# Patient Record
Sex: Female | Born: 1965
Health system: Southern US, Community
[De-identification: ages and names within clinical notes are randomized; demographics above are authoritative.]

## PROBLEM LIST (undated history)

## (undated) DIAGNOSIS — E785 Hyperlipidemia, unspecified: Secondary | ICD-10-CM

## (undated) DIAGNOSIS — D649 Anemia, unspecified: Secondary | ICD-10-CM

## (undated) DIAGNOSIS — I214 Non-ST elevation (NSTEMI) myocardial infarction: Secondary | ICD-10-CM

## (undated) DIAGNOSIS — E079 Disorder of thyroid, unspecified: Secondary | ICD-10-CM

## (undated) DIAGNOSIS — I10 Essential (primary) hypertension: Secondary | ICD-10-CM

## (undated) HISTORY — DX: Non-ST elevation (NSTEMI) myocardial infarction: I21.4

## (undated) HISTORY — DX: Anemia, unspecified: D64.9

## (undated) HISTORY — DX: Hyperlipidemia, unspecified: E78.5

## (undated) SURGERY — Surgical Case
Anesthesia: *Unknown

---

## 1998-04-17 ENCOUNTER — Other Ambulatory Visit: Admission: RE | Admit: 1998-04-17 | Discharge: 1998-04-17 | Payer: Self-pay | Admitting: Family Medicine

## 2001-02-24 ENCOUNTER — Ambulatory Visit (HOSPITAL_COMMUNITY): Admission: RE | Admit: 2001-02-24 | Discharge: 2001-02-24 | Payer: Self-pay | Admitting: Family Medicine

## 2002-03-05 ENCOUNTER — Emergency Department (HOSPITAL_COMMUNITY): Admission: EM | Admit: 2002-03-05 | Discharge: 2002-03-05 | Payer: Self-pay | Admitting: Emergency Medicine

## 2002-11-01 ENCOUNTER — Encounter: Payer: Self-pay | Admitting: Emergency Medicine

## 2002-11-01 ENCOUNTER — Emergency Department (HOSPITAL_COMMUNITY): Admission: EM | Admit: 2002-11-01 | Discharge: 2002-11-02 | Payer: Self-pay | Admitting: Emergency Medicine

## 2004-05-23 ENCOUNTER — Ambulatory Visit: Payer: Self-pay | Admitting: Family Medicine

## 2004-05-27 ENCOUNTER — Ambulatory Visit: Payer: Self-pay | Admitting: *Deleted

## 2004-08-11 ENCOUNTER — Ambulatory Visit: Payer: Self-pay | Admitting: Family Medicine

## 2004-09-22 ENCOUNTER — Emergency Department (HOSPITAL_COMMUNITY): Admission: EM | Admit: 2004-09-22 | Discharge: 2004-09-22 | Payer: Self-pay | Admitting: Emergency Medicine

## 2005-02-11 ENCOUNTER — Ambulatory Visit: Payer: Self-pay | Admitting: Family Medicine

## 2005-03-18 ENCOUNTER — Ambulatory Visit: Payer: Self-pay | Admitting: Family Medicine

## 2005-03-27 ENCOUNTER — Ambulatory Visit (HOSPITAL_COMMUNITY): Admission: RE | Admit: 2005-03-27 | Discharge: 2005-03-27 | Payer: Self-pay | Admitting: Family Medicine

## 2005-10-06 ENCOUNTER — Emergency Department (HOSPITAL_COMMUNITY): Admission: EM | Admit: 2005-10-06 | Discharge: 2005-10-06 | Payer: Self-pay | Admitting: Emergency Medicine

## 2005-11-26 ENCOUNTER — Ambulatory Visit: Payer: Self-pay | Admitting: Family Medicine

## 2007-01-20 ENCOUNTER — Ambulatory Visit: Payer: Self-pay | Admitting: Family Medicine

## 2007-02-22 ENCOUNTER — Ambulatory Visit: Payer: Self-pay | Admitting: Family Medicine

## 2007-06-08 ENCOUNTER — Encounter (INDEPENDENT_AMBULATORY_CARE_PROVIDER_SITE_OTHER): Payer: Self-pay | Admitting: *Deleted

## 2008-01-30 ENCOUNTER — Ambulatory Visit: Payer: Self-pay | Admitting: Family Medicine

## 2008-01-30 LAB — CONVERTED CEMR LAB
AST: 18 units/L (ref 0–37)
Albumin: 4.6 g/dL (ref 3.5–5.2)
CO2: 20 meq/L (ref 19–32)
Chloride: 102 meq/L (ref 96–112)
Cholesterol: 208 mg/dL — ABNORMAL HIGH (ref 0–200)
Eosinophils Absolute: 0.2 10*3/uL (ref 0.0–0.7)
Glucose, Bld: 355 mg/dL — ABNORMAL HIGH (ref 70–99)
HCT: 36 % (ref 36.0–46.0)
HDL: 48 mg/dL (ref >39)
Hemoglobin: 10.1 g/dL — ABNORMAL LOW (ref 12.0–15.0)
LDL Cholesterol: 129 mg/dL — ABNORMAL HIGH (ref 0–99)
Lymphocytes Relative: 22 % (ref 12–46)
MCV: 63.5 fL — ABNORMAL LOW (ref 78.0–100.0)
Potassium: 4.6 meq/L (ref 3.5–5.3)
RDW: 16.9 % — ABNORMAL HIGH (ref 11.5–15.5)
Total CHOL/HDL Ratio: 4.3
Total Protein: 8.4 g/dL — ABNORMAL HIGH (ref 6.0–8.3)

## 2008-02-01 ENCOUNTER — Encounter (INDEPENDENT_AMBULATORY_CARE_PROVIDER_SITE_OTHER): Payer: Self-pay | Admitting: Family Medicine

## 2008-02-01 LAB — CONVERTED CEMR LAB
Retic Ct Pct: 1.5 % (ref 0.4–3.1)
TIBC: 462 ug/dL (ref 250–470)

## 2008-02-20 ENCOUNTER — Encounter (INDEPENDENT_AMBULATORY_CARE_PROVIDER_SITE_OTHER): Payer: Self-pay | Admitting: Family Medicine

## 2008-02-20 ENCOUNTER — Ambulatory Visit: Payer: Self-pay | Admitting: Internal Medicine

## 2008-02-23 ENCOUNTER — Ambulatory Visit (HOSPITAL_COMMUNITY): Admission: RE | Admit: 2008-02-23 | Discharge: 2008-02-23 | Payer: Self-pay | Admitting: Family Medicine

## 2008-05-02 ENCOUNTER — Ambulatory Visit: Payer: Self-pay | Admitting: Family Medicine

## 2008-06-28 ENCOUNTER — Emergency Department (HOSPITAL_COMMUNITY): Admission: EM | Admit: 2008-06-28 | Discharge: 2008-06-29 | Payer: Self-pay | Admitting: Emergency Medicine

## 2008-07-10 ENCOUNTER — Ambulatory Visit: Payer: Self-pay | Admitting: Family Medicine

## 2008-08-14 ENCOUNTER — Ambulatory Visit: Payer: Self-pay | Admitting: Internal Medicine

## 2009-02-21 ENCOUNTER — Emergency Department (HOSPITAL_COMMUNITY): Admission: EM | Admit: 2009-02-21 | Discharge: 2009-02-22 | Payer: Self-pay | Admitting: Emergency Medicine

## 2009-06-04 ENCOUNTER — Ambulatory Visit: Payer: Self-pay | Admitting: Family Medicine

## 2009-07-02 ENCOUNTER — Ambulatory Visit: Payer: Self-pay | Admitting: Family Medicine

## 2009-07-02 LAB — CONVERTED CEMR LAB
Basophils Absolute: 0.1 10*3/uL (ref 0.0–0.1)
Chloride: 104 meq/L (ref 96–112)
Eosinophils Absolute: 0.1 10*3/uL (ref 0.0–0.7)
Glucose, Bld: 311 mg/dL — ABNORMAL HIGH (ref 70–99)
HCT: 38.5 % (ref 36.0–46.0)
Lymphs Abs: 2.2 10*3/uL (ref 0.7–4.0)
MCV: 80 fL (ref 78.0–100.0)
Neutro Abs: 3.2 10*3/uL (ref 1.7–7.7)
Platelets: 310 10*3/uL (ref 150–400)
RBC: 4.81 M/uL (ref 3.87–5.11)
Vit D, 25-Hydroxy: 23 ng/mL — ABNORMAL LOW (ref 30–89)

## 2009-08-28 ENCOUNTER — Ambulatory Visit: Payer: Self-pay | Admitting: Family Medicine

## 2009-09-03 ENCOUNTER — Ambulatory Visit: Payer: Self-pay | Admitting: Family Medicine

## 2009-11-05 ENCOUNTER — Ambulatory Visit: Payer: Self-pay | Admitting: Family Medicine

## 2009-12-03 ENCOUNTER — Ambulatory Visit: Payer: Self-pay | Admitting: Family Medicine

## 2010-01-06 ENCOUNTER — Ambulatory Visit: Payer: Self-pay | Admitting: Family Medicine

## 2010-01-06 LAB — CONVERTED CEMR LAB
Creatinine, Ser: 0.68 mg/dL (ref 0.40–1.20)
HDL: 68 mg/dL (ref >39)
Hgb A1c MFr Bld: 14.2 % — ABNORMAL HIGH (ref <5.7)
LDL Cholesterol: 108 mg/dL — ABNORMAL HIGH (ref 0–99)
Potassium: 3.7 meq/L (ref 3.5–5.3)
VLDL: 18 mg/dL (ref 0–40)

## 2010-02-04 ENCOUNTER — Ambulatory Visit: Payer: Self-pay | Admitting: Family Medicine

## 2010-02-21 ENCOUNTER — Ambulatory Visit (HOSPITAL_COMMUNITY): Admission: RE | Admit: 2010-02-21 | Discharge: 2010-02-21 | Payer: Self-pay | Admitting: Internal Medicine

## 2010-11-04 ENCOUNTER — Encounter (INDEPENDENT_AMBULATORY_CARE_PROVIDER_SITE_OTHER): Payer: Self-pay | Admitting: Family Medicine

## 2010-11-04 LAB — CONVERTED CEMR LAB
AST: 27 units/L (ref 0–37)
Albumin: 4.1 g/dL (ref 3.5–5.2)
BUN: 8 mg/dL (ref 6–23)
CO2: 24 meq/L (ref 19–32)
Sodium: 141 meq/L (ref 135–145)
Total Bilirubin: 0.5 mg/dL (ref 0.3–1.2)
Triglycerides: 69 mg/dL (ref <150)
VLDL: 14 mg/dL (ref 0–40)

## 2010-12-09 ENCOUNTER — Inpatient Hospital Stay (INDEPENDENT_AMBULATORY_CARE_PROVIDER_SITE_OTHER)
Admission: RE | Admit: 2010-12-09 | Discharge: 2010-12-09 | Disposition: A | Discharge status: Home / Self Care | Payer: Self-pay | Source: Ambulatory Visit | Attending: Emergency Medicine | Admitting: Emergency Medicine

## 2010-12-09 ENCOUNTER — Emergency Department (HOSPITAL_COMMUNITY)
Admission: EM | Admit: 2010-12-09 | Discharge: 2010-12-10 | Disposition: A | Discharge status: Home / Self Care | Payer: Self-pay | Attending: Emergency Medicine | Admitting: Emergency Medicine

## 2010-12-09 DIAGNOSIS — R51 Headache: Secondary | ICD-10-CM | POA: Insufficient documentation

## 2010-12-09 DIAGNOSIS — Z794 Long term (current) use of insulin: Secondary | ICD-10-CM | POA: Insufficient documentation

## 2010-12-09 DIAGNOSIS — D649 Anemia, unspecified: Secondary | ICD-10-CM | POA: Insufficient documentation

## 2010-12-09 DIAGNOSIS — R42 Dizziness and giddiness: Secondary | ICD-10-CM | POA: Insufficient documentation

## 2010-12-09 DIAGNOSIS — E119 Type 2 diabetes mellitus without complications: Secondary | ICD-10-CM | POA: Insufficient documentation

## 2010-12-09 LAB — POCT URINALYSIS DIP (DEVICE)
Glucose, UA: 500 mg/dL — AB
Ketones, ur: NEGATIVE mg/dL
Specific Gravity, Urine: 1.01 (ref 1.005–1.030)
pH: 8 (ref 5.0–8.0)

## 2010-12-09 LAB — POCT I-STAT, CHEM 8
Chloride: 100 mEq/L (ref 96–112)
Glucose, Bld: 210 mg/dL — ABNORMAL HIGH (ref 70–99)
Potassium: 3.7 mEq/L (ref 3.5–5.1)
Sodium: 139 mEq/L (ref 135–145)
TCO2: 24 mmol/L (ref 0–100)

## 2010-12-09 LAB — OCCULT BLOOD, POC DEVICE: Fecal Occult Bld: NEGATIVE

## 2010-12-09 LAB — GLUCOSE, CAPILLARY: Glucose-Capillary: 215 mg/dL — ABNORMAL HIGH (ref 70–99)

## 2010-12-10 ENCOUNTER — Emergency Department (HOSPITAL_COMMUNITY): Payer: Self-pay

## 2010-12-29 LAB — DIFFERENTIAL
Lymphocytes Relative: 31 % (ref 12–46)
Neutrophils Relative %: 58 % (ref 43–77)

## 2010-12-29 LAB — CBC
HCT: 30.5 % — ABNORMAL LOW (ref 36.0–46.0)
Hemoglobin: 9.5 g/dL — ABNORMAL LOW (ref 12.0–15.0)
MCHC: 31.1 g/dL (ref 30.0–36.0)
RBC: 4.85 MIL/uL (ref 3.87–5.11)
WBC: 7.8 10*3/uL (ref 4.0–10.5)

## 2010-12-29 LAB — BASIC METABOLIC PANEL
CO2: 22 mEq/L (ref 19–32)
Chloride: 96 mEq/L (ref 96–112)
Glucose, Bld: 451 mg/dL — ABNORMAL HIGH (ref 70–99)
Potassium: 3.5 mEq/L (ref 3.5–5.1)

## 2010-12-29 LAB — URINALYSIS, ROUTINE W REFLEX MICROSCOPIC
Bilirubin Urine: NEGATIVE
Ketones, ur: NEGATIVE mg/dL
Protein, ur: NEGATIVE mg/dL
Urobilinogen, UA: 1 mg/dL (ref 0.0–1.0)

## 2010-12-29 LAB — GLUCOSE, CAPILLARY
Glucose-Capillary: 221 mg/dL — ABNORMAL HIGH (ref 70–99)
Glucose-Capillary: 520 mg/dL (ref 70–99)

## 2010-12-29 LAB — URINE MICROSCOPIC-ADD ON

## 2010-12-29 LAB — HEMOCCULT GUIAC POC 1CARD (OFFICE): Fecal Occult Bld: NEGATIVE

## 2011-04-24 ENCOUNTER — Inpatient Hospital Stay (INDEPENDENT_AMBULATORY_CARE_PROVIDER_SITE_OTHER)
Admission: RE | Admit: 2011-04-24 | Discharge: 2011-04-24 | Disposition: A | Discharge status: Home / Self Care | Payer: Self-pay | Source: Ambulatory Visit | Attending: Emergency Medicine | Admitting: Emergency Medicine

## 2011-04-24 DIAGNOSIS — S61209A Unspecified open wound of unspecified finger without damage to nail, initial encounter: Secondary | ICD-10-CM

## 2011-06-22 LAB — CBC
MCHC: 31.2
MCV: 68 — ABNORMAL LOW
RBC: 5.16 — ABNORMAL HIGH
RDW: 16.9 — ABNORMAL HIGH
WBC: 9.7

## 2011-06-22 LAB — CK TOTAL AND CKMB (NOT AT ARMC)
CK, MB: 1.1
CK, MB: 1.5

## 2011-06-22 LAB — D-DIMER, QUANTITATIVE: D-Dimer, Quant: 0.3

## 2011-06-22 LAB — APTT: aPTT: 27

## 2011-06-22 LAB — BASIC METABOLIC PANEL
CO2: 24
Calcium: 9
GFR calc Af Amer: >60
GFR calc non Af Amer: >60
Glucose, Bld: 325 — ABNORMAL HIGH

## 2011-06-22 LAB — PROTIME-INR: INR: 1

## 2011-06-22 LAB — POCT I-STAT, CHEM 8
Chloride: 103
Glucose, Bld: 320 — ABNORMAL HIGH
HCT: 37
Hemoglobin: 12.6
Potassium: 4.1
Sodium: 134 — ABNORMAL LOW

## 2011-06-22 LAB — POCT CARDIAC MARKERS
Myoglobin, poc: 38.9
Troponin i, poc: <0.05

## 2011-06-22 LAB — TROPONIN I
Troponin I: 0.01
Troponin I: <0.01

## 2011-09-19 ENCOUNTER — Ambulatory Visit: Payer: Self-pay

## 2011-09-19 DIAGNOSIS — J111 Influenza due to unidentified influenza virus with other respiratory manifestations: Secondary | ICD-10-CM

## 2011-09-19 DIAGNOSIS — R05 Cough: Secondary | ICD-10-CM

## 2011-09-19 DIAGNOSIS — R509 Fever, unspecified: Secondary | ICD-10-CM

## 2011-12-29 ENCOUNTER — Encounter (HOSPITAL_COMMUNITY): Payer: Self-pay | Admitting: *Deleted

## 2011-12-29 ENCOUNTER — Emergency Department (HOSPITAL_COMMUNITY)
Admission: EM | Admit: 2011-12-29 | Discharge: 2011-12-30 | Disposition: A | Discharge status: Home / Self Care | Payer: Self-pay | Attending: Emergency Medicine | Admitting: Emergency Medicine

## 2011-12-29 DIAGNOSIS — Z794 Long term (current) use of insulin: Secondary | ICD-10-CM | POA: Insufficient documentation

## 2011-12-29 DIAGNOSIS — E119 Type 2 diabetes mellitus without complications: Secondary | ICD-10-CM | POA: Insufficient documentation

## 2011-12-29 DIAGNOSIS — G589 Mononeuropathy, unspecified: Secondary | ICD-10-CM | POA: Insufficient documentation

## 2011-12-29 DIAGNOSIS — R110 Nausea: Secondary | ICD-10-CM | POA: Insufficient documentation

## 2011-12-29 DIAGNOSIS — Z79899 Other long term (current) drug therapy: Secondary | ICD-10-CM | POA: Insufficient documentation

## 2011-12-29 DIAGNOSIS — R42 Dizziness and giddiness: Secondary | ICD-10-CM | POA: Insufficient documentation

## 2011-12-29 DIAGNOSIS — M79609 Pain in unspecified limb: Secondary | ICD-10-CM | POA: Insufficient documentation

## 2011-12-29 DIAGNOSIS — R259 Unspecified abnormal involuntary movements: Secondary | ICD-10-CM | POA: Insufficient documentation

## 2011-12-29 DIAGNOSIS — R51 Headache: Secondary | ICD-10-CM | POA: Insufficient documentation

## 2011-12-29 DIAGNOSIS — G629 Polyneuropathy, unspecified: Secondary | ICD-10-CM

## 2011-12-29 MED ORDER — SODIUM CHLORIDE 0.9 % IV SOLN
INTRAVENOUS | Status: DC
  Administered 2011-12-29: 1000 mL via INTRAVENOUS

## 2011-12-29 NOTE — ED Notes (Addendum)
Patient with onset of dizziness, feeling shaky, and having nausea yesterday but today her sx are worse,  She does not speak english,  Family is interpreting spanish

## 2011-12-29 NOTE — ED Provider Notes (Signed)
History     CSN: [id number]  Arrival date & time 12/29/11  1750   First MD Initiated Contact with Patient 12/29/11 2318      Chief Complaint  Patient presents with  . Dizziness  . Shaking  . Nausea    (Consider location/radiation/quality/duration/timing/severity/associated sxs/prior treatment) The history is provided by the patient and a relative.   patient states that she's been feeling dizzy. Is worse when she stands up. She states it is worse with her eyes are closed. She states she's also been having pain in her feet. She states she's been seen for dizziness before was told that she had to take vitamins. She speaks Spanish and has been translated by a family member. She is diabetic. She states she will stand up and the feeling she is to follow her. She's also had a dull headache.  Past Medical History  Diagnosis Date  . Diabetes mellitus     History reviewed. No pertinent past surgical history.  No family history on file.  History  Substance Use Topics  . Smoking status: Never Smoker   . Smokeless tobacco: Not on file  . Alcohol Use: No    OB History    Grav Para Term Preterm Abortions TAB SAB Ect Mult Living                  Review of Systems  Constitutional: Negative for activity change and appetite change.  HENT: Negative for neck stiffness.   Eyes: Negative for pain.  Respiratory: Negative for chest tightness and shortness of breath.   Cardiovascular: Negative for chest pain and leg swelling.  Gastrointestinal: Negative for nausea, vomiting, abdominal pain and diarrhea.  Genitourinary: Negative for flank pain.  Musculoskeletal: Negative for back pain.       Pain in both of her feet  Skin: Negative for rash.  Neurological: Positive for dizziness and headaches. Negative for weakness and numbness.  Psychiatric/Behavioral: Negative for behavioral problems.    Allergies  Review of patient's allergies indicates no known allergies.  Home Medications    Current Outpatient Rx  Name Route Sig Dispense Refill  . INSULIN ASPART PROT & ASPART (70-30) 100 UNIT/ML [place] SUSP Subcutaneous Inject 30 Units into the skin 2 (two) times daily with a meal. Pt states she took 41 units today (12/29/11)    . METFORMIN HCL 1000 MG PO TABS Oral Take 1,000 mg by mouth 2 (two) times daily with a meal.    . TRAMADOL HCL 50 MG PO TABS Oral Take 50 mg by mouth every 6 (six) hours as needed. For pain    . TRAZODONE HCL 50 MG PO TABS Oral Take 50 mg by mouth at bedtime.    Marland Kitchen GABAPENTIN 300 MG PO CAPS Oral Take 1 capsule (300 mg total) by mouth daily. 14 capsule 0    BP 141/65  Pulse 99  Temp(Src) 97.8 F (36.6 C) (Oral)  Resp 29  SpO2 100%  Physical Exam  Nursing note and vitals reviewed. Constitutional: She is oriented to person, place, and time. She appears well-developed and well-nourished.  HENT:  Head: Normocephalic and atraumatic.  Eyes: EOM are normal. Pupils are equal, round, and reactive to light.  Neck: Normal range of motion. Neck supple.  Cardiovascular: Regular rhythm and normal heart sounds.   No murmur heard. Pulmonary/Chest: Effort normal and breath sounds normal. No respiratory distress. She has no wheezes. She has no rales.  Abdominal: Soft. Bowel sounds are normal. She exhibits no distension. There  is no tenderness. There is no rebound and no guarding.  Musculoskeletal: Normal range of motion.  Neurological: She is alert and oriented to person, place, and time. No cranial nerve deficit.       Finger-nose intact bilaterally. Patient is able to ambulate normally. Patient is able to stand with arms out. She does get a little study with her eyes closed, but does not all over.  Skin: Skin is warm and dry.  Psychiatric: She has a normal mood and affect. Her speech is normal.    ED Course  Procedures (including critical care time)  Labs Reviewed  CBC - Abnormal; Notable for the following:    Hemoglobin 9.6 (*)    HCT 30.6 (*)    MCV 64.7  (*)    MCH 20.3 (*)    RDW 16.0 (*)    All other components within normal limits  COMPREHENSIVE METABOLIC PANEL - Abnormal; Notable for the following:    Sodium 129 (*)    Chloride 94 (*)    Glucose, Bld 293 (*)    BUN 32 (*)    Total Bilirubin 0.2 (*)    GFR calc non Af Amer 83 (*)    All other components within normal limits  URINALYSIS, ROUTINE W REFLEX MICROSCOPIC - Abnormal; Notable for the following:    Glucose, UA >1000 (*)    All other components within normal limits  URINE MICROSCOPIC-ADD ON - Abnormal; Notable for the following:    Squamous Epithelial / LPF FEW (*)    Casts GRANULAR CAST (*) HYALINE CASTS   All other components within normal limits  GLUCOSE, CAPILLARY - Abnormal; Notable for the following:    Glucose-Capillary 212 (*)    All other components within normal limits  DIFFERENTIAL   No results found.   1. Neuropathy   2. Diabetes mellitus   3. Dizziness     Date: 12/30/2011  Rate: 93  Rhythm: normal sinus rhythm  QRS Axis: normal  Intervals: normal  ST/T Wave abnormalities: normal  Conduction Disutrbances:none  Narrative Interpretation:   Old EKG Reviewed: none available     MDM  Pain in both of her feet and dizziness. Laboratory reassuring except for a hyperglycemia is improved. She feels better after some fluids. She will be started on Neurontin and will followup with her primary care Dr. she has a nonfocal neurologic examination. The dizziness has been worked up previously in she's had a head CT rather recently.        Juliet Rude. Rubin Payor, MD 12/30/11 [phone]

## 2011-12-30 ENCOUNTER — Other Ambulatory Visit: Payer: Self-pay

## 2011-12-30 LAB — URINALYSIS, ROUTINE W REFLEX MICROSCOPIC
Ketones, ur: NEGATIVE mg/dL
Leukocytes, UA: NEGATIVE
Nitrite: NEGATIVE
Protein, ur: NEGATIVE mg/dL
Urobilinogen, UA: 0.2 mg/dL (ref 0.0–1.0)

## 2011-12-30 LAB — COMPREHENSIVE METABOLIC PANEL
Alkaline Phosphatase: 110 U/L (ref 39–117)
BUN: 32 mg/dL — ABNORMAL HIGH (ref 6–23)
Chloride: 94 mEq/L — ABNORMAL LOW (ref 96–112)
Creatinine, Ser: 0.83 mg/dL (ref 0.50–1.10)
GFR calc Af Amer: >90 mL/min (ref >90)
GFR calc non Af Amer: 83 mL/min — ABNORMAL LOW (ref >90)
Glucose, Bld: 293 mg/dL — ABNORMAL HIGH (ref 70–99)
Potassium: 4.1 mEq/L (ref 3.5–5.1)
Total Bilirubin: 0.2 mg/dL — ABNORMAL LOW (ref 0.3–1.2)

## 2011-12-30 LAB — DIFFERENTIAL
Lymphs Abs: 3.2 10*3/uL (ref 0.7–4.0)
Monocytes Absolute: 0.6 10*3/uL (ref 0.1–1.0)
Monocytes Relative: 7 % (ref 3–12)
Neutro Abs: 4.9 10*3/uL (ref 1.7–7.7)
Neutrophils Relative %: 54 % (ref 43–77)

## 2011-12-30 LAB — CBC
HCT: 30.6 % — ABNORMAL LOW (ref 36.0–46.0)
Hemoglobin: 9.6 g/dL — ABNORMAL LOW (ref 12.0–15.0)
MCH: 20.3 pg — ABNORMAL LOW (ref 26.0–34.0)
RBC: 4.73 MIL/uL (ref 3.87–5.11)

## 2011-12-30 LAB — GLUCOSE, CAPILLARY: Glucose-Capillary: 212 mg/dL — ABNORMAL HIGH (ref 70–99)

## 2011-12-30 MED ORDER — SODIUM CHLORIDE 0.9 % IV BOLUS (SEPSIS)
1000.0000 mL | Freq: Once | INTRAVENOUS | Status: AC
  Administered 2011-12-30: 1000 mL via INTRAVENOUS

## 2011-12-30 MED ORDER — GABAPENTIN 300 MG PO CAPS: 300.0000 mg | ORAL_CAPSULE | Freq: Every day | ORAL | Status: DC

## 2011-12-30 NOTE — ED Notes (Signed)
Rx x 1, pt voiced understanding to f/u with [facility]

## 2011-12-30 NOTE — Discharge Instructions (Signed)
Dolor Neuroptico (Neuropathic Pain) En general se piensa que el dolor tiene una causa fsica. Si eliminamos la causa, Teacher, music. Sin embargo, los nervios mismos pueden Programmer, multimedia. Este tipo de dolor no desaparece fcilmente. Se denomina dolor neuroptico, lo que significa que existe una anormalidad neurolgica. Este problema puede ser de difcil comprensin tanto para los pacientes como para el profesional tratante. La causa puede ser una lesin o una insuficiencia en el sistema nervioso. La causa del dolor generalmente es una lesin, pero esta lesin puede ser o no la causa del dao mismo en el sistema nervioso. Los nervios pueden ser invadidos o comprimidos por tumores, estrangulados por tejido cicatrizal o inflamarse por una infeccin. La causa del dolor neuroptico generalmente es una lesin nerviosa debida a la diabetes.El consumo excesivo de alcohol tambin puede producir dolor neuroptico. El dolor neuroptico a menudo parece no tener causa. El diagnstico se realiza cuando no se encuentra otra causa.  El dolor puede persistir durante meses o aos luego de la curacin del nervio lesionado. Cuando esto ocurre, las seales de dolor ya no producen alarma por una lesin. En cambio, el sistema mismo de alarma no funciona correctamente.  CARACTERSTICAS DEL DOLOR NEUROPTICO  Es un dolor intenso, agudo, similar a un shock elctrico, punzante, como si le clavaran un cuchillo.   Sensacin de pinchazos.   Sensacin profunda de quemazn, fro o dolor.   Adormecimiento persistente, hormigueo o debilidad.   El dolor que resulta de un toque de luz u otros estmulos que generalmente no Teaching laboratory technician.   Aumento de la sensibilidad a algo que normalmente causa dolor como el pinchazo de Hydrographic surveyor.  Dolor neuroptico Financial risk analyst en vez de mejorar con [employer]. En algunas personas puede conducir a una discapacidad grave. Es importante saber que un traumatismo grave en un miembro puede  producirse sin una adecuada respuesta de proteccin Equities trader.Algunas quemaduras, cortes y otras lesiones pueden pasar inadvertidas. Sin el tratamiento [place], estas lesiones pueden infectarse o producir discapacidad. Considere seriamente cualquier lesin y consulte a su mdico para recibir TEFL teacher. TRATAMIENTO El dolor neuroptico generalmente es de larga duracin y no mejora al tratarlo con opioides (analgsicos de tipo narctico) Pero puede responder bien a otras drogas como anticonvulsivos o antidepresivos. Generalmente los problemas neuropticos no se curan completamente, pero puede haber mejoras parciales con el tratamiento adecuado. Los profesionales y Therapist, sports los medicamentos que le permitan llevar una vida normal. No se desanime si no obtiene alivio inmediato de los medicamentos. En algunos casos diferentes medicamentos o una combinacin de ellos se probarn antes de que pueda obtener los beneficios esperados. SOLICITE ATENCIN MDICA DE INMEDIATO SI:   Hay un cambio repentino en la calidad del dolor, especialmente si observa el cambio slo en un lado del cuerpo.   Nota modificaciones en la piel, como enrojecimiento, cambios en el color a negro o prpura, hinchazn o una lcera.   No puede mover el miembro afectado.  Document Released: 07/05/2007 Document Revised: 08/27/2011 [facility] Patient Information 2012 [place], [place].

## 2012-04-17 ENCOUNTER — Encounter (HOSPITAL_COMMUNITY): Payer: Self-pay | Admitting: *Deleted

## 2012-04-17 ENCOUNTER — Emergency Department (HOSPITAL_COMMUNITY)
Admission: EM | Admit: 2012-04-17 | Discharge: 2012-04-17 | Disposition: A | Discharge status: Home / Self Care | Payer: Worker's Compensation | Attending: Emergency Medicine | Admitting: Emergency Medicine

## 2012-04-17 DIAGNOSIS — X12XXXA Contact with other hot fluids, initial encounter: Secondary | ICD-10-CM | POA: Insufficient documentation

## 2012-04-17 DIAGNOSIS — Y9389 Activity, other specified: Secondary | ICD-10-CM | POA: Insufficient documentation

## 2012-04-17 DIAGNOSIS — Z79899 Other long term (current) drug therapy: Secondary | ICD-10-CM | POA: Insufficient documentation

## 2012-04-17 DIAGNOSIS — E119 Type 2 diabetes mellitus without complications: Secondary | ICD-10-CM | POA: Insufficient documentation

## 2012-04-17 DIAGNOSIS — Y9229 Other specified public building as the place of occurrence of the external cause: Secondary | ICD-10-CM | POA: Insufficient documentation

## 2012-04-17 DIAGNOSIS — Z794 Long term (current) use of insulin: Secondary | ICD-10-CM | POA: Insufficient documentation

## 2012-04-17 DIAGNOSIS — T22219A Burn of second degree of unspecified forearm, initial encounter: Secondary | ICD-10-CM | POA: Insufficient documentation

## 2012-04-17 DIAGNOSIS — T310 Burns involving less than 10% of body surface: Secondary | ICD-10-CM | POA: Insufficient documentation

## 2012-04-17 DIAGNOSIS — Y990 Civilian activity done for income or pay: Secondary | ICD-10-CM | POA: Insufficient documentation

## 2012-04-17 DIAGNOSIS — T22212A Burn of second degree of left forearm, initial encounter: Secondary | ICD-10-CM

## 2012-04-17 MED ORDER — OXYCODONE-ACETAMINOPHEN 5-325 MG PO TABS
1.0000 | ORAL_TABLET | Freq: Once | ORAL | Status: AC
  Administered 2012-04-17: 1 via ORAL
  Filled 2012-04-17: qty 1

## 2012-04-17 MED ORDER — TETANUS-DIPHTH-ACELL PERTUSSIS 5-2.5-18.5 LF-MCG/0.5 IM SUSP
0.5000 mL | Freq: Once | INTRAMUSCULAR | Status: AC
  Administered 2012-04-17: 0.5 mL via INTRAMUSCULAR
  Filled 2012-04-17: qty 0.5

## 2012-04-17 MED ORDER — HYDROCODONE-ACETAMINOPHEN 5-500 MG PO TABS: 1.0000 | ORAL_TABLET | Freq: Four times a day (QID) | ORAL | Status: AC | PRN

## 2012-04-17 MED ORDER — SILVER SULFADIAZINE 1 % EX CREA
TOPICAL_CREAM | Freq: Once | CUTANEOUS | Status: AC
  Administered 2012-04-17: 1 via TOPICAL
  Filled 2012-04-17: qty 85

## 2012-04-17 NOTE — Progress Notes (Signed)
Orthopedic Tech Progress Note Patient Details:  Rebecca [name] November 16, 1965 [id number]  Ortho Devices Type of Ortho Device: Arm foam sling Ortho Device/Splint Interventions: Application   Cammer, Mickie Bail 04/17/2012, 2:05 PM

## 2012-04-17 NOTE — ED Provider Notes (Signed)
Medical screening examination/treatment/procedure(s) were performed by non-physician practitioner and as supervising physician I was immediately available for consultation/collaboration.   Paco Cislo A Fin Hupp, MD 04/17/12 1700 

## 2012-04-17 NOTE — ED Notes (Signed)
Daughter at the bedside as translator: language Spanish

## 2012-04-17 NOTE — ED Notes (Signed)
Reports burning left forearm today with hot liquids while at work. Unknown tetanus.

## 2012-04-17 NOTE — ED Provider Notes (Signed)
History     CSN: [id number]  Arrival date & time 04/17/12  1107   First MD Initiated Contact with Patient 04/17/12 1250      Chief Complaint  Patient presents with  . Burn    (Consider location/radiation/quality/duration/timing/severity/associated sxs/prior treatment) HPI  46 year old female who works at [employer] presents for evaluation for a burn. History was obtained through her daughter who acts as interpreter.  Pt was given option of having an interpreter but prefers her daughter instead.  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  C/o burning sensation to affected area.  Denies numbness.  Unknown tetanus.  Denies any other injury.    Past Medical History  Diagnosis Date  . Diabetes mellitus     History reviewed. No pertinent past surgical history.  History reviewed. No pertinent family history.  History  Substance Use Topics  . Smoking status: Never Smoker   . Smokeless tobacco: Not on file  . Alcohol Use: No    OB History    Grav Para Term Preterm Abortions TAB SAB Ect Mult Living                  Review of Systems  Constitutional: Negative for fever.  Skin: Positive for wound.  Neurological: Negative for numbness.  All other systems reviewed and are negative.    Allergies  Review of patient's allergies indicates no known allergies.  Home Medications   Current Outpatient Rx  Name Route Sig Dispense Refill  . FERROUS SULFATE 325 (65 FE) MG PO TABS Oral Take 325 mg by mouth 2 (two) times daily.    . INSULIN ASPART PROT & ASPART (70-30) 100 UNIT/ML [place] SUSP Subcutaneous Inject 30 Units into the skin 2 (two) times daily with a meal. Pt states she took 41 units today (12/29/11)    . INSULIN DETEMIR 100 UNIT/ML [place] SOLN Subcutaneous Inject 20 Units into the skin at bedtime.    Marland Kitchen METFORMIN HCL 1000 MG PO TABS Oral Take 1,000 mg by mouth 2 (two) times daily with a  meal.    . TRAMADOL HCL 50 MG PO TABS Oral Take 50 mg by mouth every 6 (six) hours as needed. For pain      BP 153/83  Pulse 112  Temp 98.9 F (37.2 C) (Oral)  Resp 18  Ht 5' (1.524 m)  Wt 130 lb (58.968 kg)  BMI 25.39 kg/m2  SpO2 99%  Physical Exam  Nursing note and vitals reviewed. Constitutional: She appears well-developed and well-nourished.  HENT:  Head: Atraumatic.  Eyes: Conjunctivae are normal.  Neck: Neck supple.  Neurological: She is alert.  Skin: Skin is warm. No rash noted.     Psychiatric: She has a normal mood and affect.    ED Course  Procedures (including critical care time)  Labs Reviewed - No data to display No results found.   No diagnosis found.  1. Burn (2% BSA) to L forearm  MDM  Pt with 2% body surface burn to her L forearm from hot liquid. Nearly circumferential burn. Wound cleansed.  Tdap given.  Silvadene applied.  Care instruction including keeping arm elevated, apply silvadene twice daily, not to pop blisters, and clean area with soap and water were discussed. Sling give for support.  Pt voice understanding and agrees with plan.  Referral to [facility] burn unit given.  Strict return precaution for signs of  infection or compartment syndrome were discussed.          Fayrene Helper, PA-C 04/17/12 1429

## 2012-05-21 ENCOUNTER — Observation Stay (HOSPITAL_COMMUNITY)
Admission: EM | Admit: 2012-05-21 | Discharge: 2012-05-22 | Disposition: A | Discharge status: Home / Self Care | Payer: Self-pay | Attending: Internal Medicine | Admitting: Internal Medicine

## 2012-05-21 ENCOUNTER — Encounter (HOSPITAL_COMMUNITY): Payer: Self-pay | Admitting: *Deleted

## 2012-05-21 DIAGNOSIS — R739 Hyperglycemia, unspecified: Secondary | ICD-10-CM

## 2012-05-21 DIAGNOSIS — Z794 Long term (current) use of insulin: Secondary | ICD-10-CM | POA: Insufficient documentation

## 2012-05-21 DIAGNOSIS — R7309 Other abnormal glucose: Secondary | ICD-10-CM

## 2012-05-21 DIAGNOSIS — E1101 Type 2 diabetes mellitus with hyperosmolarity with coma: Principal | ICD-10-CM | POA: Diagnosis present

## 2012-05-21 DIAGNOSIS — E870 Hyperosmolality and hypernatremia: Secondary | ICD-10-CM

## 2012-05-21 LAB — COMPREHENSIVE METABOLIC PANEL WITH GFR
ALT: 11 U/L (ref 0–35)
AST: 12 U/L (ref 0–37)
Albumin: 3.9 g/dL (ref 3.5–5.2)
Alkaline Phosphatase: 145 U/L — ABNORMAL HIGH (ref 39–117)
BUN: 20 mg/dL (ref 6–23)
CO2: 22 meq/L (ref 19–32)
Calcium: 9.7 mg/dL (ref 8.4–10.5)
Chloride: 85 meq/L — ABNORMAL LOW (ref 96–112)
Creatinine, Ser: 0.7 mg/dL (ref 0.50–1.10)
GFR calc Af Amer: >90 mL/min
GFR calc non Af Amer: >90 mL/min
Glucose, Bld: 842 mg/dL (ref 70–99)
Potassium: 4.5 meq/L (ref 3.5–5.1)
Sodium: 122 meq/L — ABNORMAL LOW (ref 135–145)
Total Bilirubin: 0.4 mg/dL (ref 0.3–1.2)
Total Protein: 8.1 g/dL (ref 6.0–8.3)

## 2012-05-21 LAB — GLUCOSE, CAPILLARY
Glucose-Capillary: >600 mg/dL (ref 70–99)
Glucose-Capillary: >600 mg/dL (ref 70–99)
Glucose-Capillary: 280 mg/dL — ABNORMAL HIGH (ref 70–99)
Glucose-Capillary: 185 mg/dL — ABNORMAL HIGH (ref 70–99)
Glucose-Capillary: 179 mg/dL — ABNORMAL HIGH (ref 70–99)

## 2012-05-21 LAB — URINALYSIS, ROUTINE W REFLEX MICROSCOPIC
Bilirubin Urine: NEGATIVE
Glucose, UA: >1000 mg/dL — AB
Ketones, ur: NEGATIVE mg/dL
Leukocytes, UA: NEGATIVE
Nitrite: NEGATIVE
Protein, ur: NEGATIVE mg/dL
Specific Gravity, Urine: 1.028 (ref 1.005–1.030)
Urobilinogen, UA: 0.2 mg/dL (ref 0.0–1.0)
pH: 6.5 (ref 5.0–8.0)

## 2012-05-21 LAB — CBC WITH DIFFERENTIAL/PLATELET
Basophils Absolute: 0.1 K/uL (ref 0.0–0.1)
Basophils Relative: 1 % (ref 0–1)
Eosinophils Absolute: 0 K/uL (ref 0.0–0.7)
Eosinophils Relative: 1 % (ref 0–5)
HCT: 34.8 % — ABNORMAL LOW (ref 36.0–46.0)
Hemoglobin: 11 g/dL — ABNORMAL LOW (ref 12.0–15.0)
Lymphocytes Relative: 16 % (ref 12–46)
Lymphs Abs: 1.2 K/uL (ref 0.7–4.0)
MCH: 22 pg — ABNORMAL LOW (ref 26.0–34.0)
MCHC: 31.6 g/dL (ref 30.0–36.0)
MCV: 69.7 fL — ABNORMAL LOW (ref 78.0–100.0)
Monocytes Absolute: 0.4 K/uL (ref 0.1–1.0)
Monocytes Relative: 5 % (ref 3–12)
Neutro Abs: 5.5 K/uL (ref 1.7–7.7)
Neutrophils Relative %: 77 % (ref 43–77)
Platelets: 389 K/uL (ref 150–400)
RBC: 4.99 MIL/uL (ref 3.87–5.11)
RDW: 23.1 % — ABNORMAL HIGH (ref 11.5–15.5)
WBC: 7.2 K/uL (ref 4.0–10.5)

## 2012-05-21 LAB — URINE MICROSCOPIC-ADD ON

## 2012-05-21 LAB — POCT I-STAT 3, VENOUS BLOOD GAS (G3P V)
Bicarbonate: 20 mEq/L (ref 20.0–24.0)
pCO2, Ven: 38.9 mmHg — ABNORMAL LOW (ref 45.0–50.0)
pH, Ven: 7.32 — ABNORMAL HIGH (ref 7.250–7.300)
pO2, Ven: 25 mmHg — CL (ref 30.0–45.0)

## 2012-05-21 LAB — POCT PREGNANCY, URINE: Preg Test, Ur: NEGATIVE

## 2012-05-21 MED ORDER — METOCLOPRAMIDE HCL 5 MG/ML IJ SOLN
10.0000 mg | Freq: Once | INTRAMUSCULAR | Status: AC
  Administered 2012-05-21: 10 mg via INTRAVENOUS
  Filled 2012-05-21: qty 2

## 2012-05-21 MED ORDER — TRAMADOL HCL 50 MG PO TABS
50.0000 mg | ORAL_TABLET | Freq: Four times a day (QID) | ORAL | Status: DC | PRN
  Administered 2012-05-22: 50 mg via ORAL
  Filled 2012-05-21: qty 1

## 2012-05-21 MED ORDER — SODIUM CHLORIDE 0.9 % IJ SOLN
3.0000 mL | Freq: Two times a day (BID) | INTRAMUSCULAR | Status: DC
  Administered 2012-05-21: 3 mL via INTRAVENOUS

## 2012-05-21 MED ORDER — ONDANSETRON HCL 4 MG/2ML IJ SOLN: 4.0000 mg | Freq: Four times a day (QID) | INTRAMUSCULAR | Status: DC | PRN

## 2012-05-21 MED ORDER — ONDANSETRON HCL 4 MG PO TABS
4.0000 mg | ORAL_TABLET | Freq: Four times a day (QID) | ORAL | Status: DC | PRN
  Filled 2012-05-21: qty 0.5

## 2012-05-21 MED ORDER — SODIUM CHLORIDE 0.9 % IV BOLUS (SEPSIS)
1000.0000 mL | Freq: Once | INTRAVENOUS | Status: AC
  Administered 2012-05-21: 1000 mL via INTRAVENOUS

## 2012-05-21 MED ORDER — DEXTROSE 50 % IV SOLN: 25.0000 mL | INTRAVENOUS | Status: DC | PRN

## 2012-05-21 MED ORDER — INSULIN ASPART 100 UNIT/ML ~~LOC~~ SOLN: 0.0000 [IU] | Freq: Every day | SUBCUTANEOUS | Status: DC

## 2012-05-21 MED ORDER — INSULIN ASPART 100 UNIT/ML ~~LOC~~ SOLN
0.0000 [IU] | Freq: Three times a day (TID) | SUBCUTANEOUS | Status: DC
  Administered 2012-05-22: 11 [IU] via SUBCUTANEOUS

## 2012-05-21 MED ORDER — SODIUM CHLORIDE 0.9 % IV SOLN
INTRAVENOUS | Status: DC
  Administered 2012-05-21: 5.4 [IU]/h via INTRAVENOUS
  Filled 2012-05-21: qty 1

## 2012-05-21 MED ORDER — DEXTROSE-NACL 5-0.45 % IV SOLN
INTRAVENOUS | Status: DC
  Administered 2012-05-22: 125 mL via INTRAVENOUS

## 2012-05-21 MED ORDER — INSULIN REGULAR BOLUS VIA INFUSION
0.0000 [IU] | Freq: Three times a day (TID) | INTRAVENOUS | Status: DC
  Administered 2012-05-21: 6.7 [IU] via INTRAVENOUS
  Filled 2012-05-21: qty 10

## 2012-05-21 MED ORDER — INSULIN GLARGINE 100 UNIT/ML ~~LOC~~ SOLN
10.0000 [IU] | Freq: Every day | SUBCUTANEOUS | Status: DC
  Administered 2012-05-22: 10 [IU] via SUBCUTANEOUS

## 2012-05-21 MED ORDER — ENOXAPARIN SODIUM 40 MG/0.4ML ~~LOC~~ SOLN
40.0000 mg | SUBCUTANEOUS | Status: DC
  Administered 2012-05-21: 40 mg via SUBCUTANEOUS
  Filled 2012-05-21 (×2): qty 0.4

## 2012-05-21 MED ORDER — SODIUM CHLORIDE 0.9 % IV SOLN: INTRAVENOUS | Status: DC

## 2012-05-21 MED ORDER — ACETAMINOPHEN 650 MG RE SUPP: 650.0000 mg | Freq: Four times a day (QID) | RECTAL | Status: DC | PRN

## 2012-05-21 MED ORDER — ACETAMINOPHEN 325 MG PO TABS: 650.0000 mg | ORAL_TABLET | Freq: Four times a day (QID) | ORAL | Status: DC | PRN

## 2012-05-21 NOTE — ED Notes (Signed)
Pt states diabetes is acting up and got pills for it on Saturday.  Pt had ran out of insulin and not got that yet.  Pt just feels weak and tired.  PT with increased urination.  Pt reports thirst

## 2012-05-21 NOTE — ED Provider Notes (Signed)
Medical screening examination/treatment/procedure(s) were conducted as a shared visit with non-physician practitioner(s) and myself.  I personally evaluated the patient during the encounter  Patient with hyperglycemia and given IV fluids and place on glucometer. Patient to be admitted  Toy Baker, MD 05/21/12 [phone]

## 2012-05-21 NOTE — H&P (Signed)
[facility] History and Physical  DELENN AHN ZOX:[id number] DOB: 10/08/1965 DOA: 05/21/2012   PCP: Margretta Ditty, MD   Chief Complaint:  Chief Complaint  Patient presents with  . Weakness  . Nausea  . Generalized Body Aches     HPI: Rebecca [name] is a 46 y.o. female with long history of diabetes who presented today to the ED with polydipsia, blurred vision,  polyuria and weakness. She ran out of insulin a few days ago. In the ED the glucose level was more than 800.   Review of Systems: The patient denies anorexia, fever, weight loss, decreased hearing, hoarseness, chest pain, syncope, dyspnea on exertion, peripheral edema, balance deficits, hemoptysis, abdominal pain, melena, hematochezia, severe indigestion/heartburn, hematuria, incontinence, genital sores, muscle weakness, suspicious skin lesions, transient blindness, difficulty walking, depression, unusual weight change, abnormal bleeding, enlarged lymph nodes, angioedema, and breast masses.    Past Medical History  Diagnosis Date  . Diabetes mellitus    History reviewed. No pertinent past surgical history. Social History:  reports that she has never smoked. She does not have any smokeless tobacco history on file. She reports that she does not drink alcohol or use illicit drugs. Lives at home with family   No Known Allergies  FHX Father - HTN Mother died of leukemia   Prior to Admission medications   Medication Sig Start Date End Date Taking? Authorizing Provider  ferrous sulfate 325 (65 FE) MG tablet Take 325 mg by mouth 2 (two) times daily.   Yes Historical Provider, MD  insulin aspart protamine-insulin aspart (NOVOLOG 70/30) (70-30) 100 UNIT/ML injection Inject 30 Units into the skin 2 (two) times daily with a meal. Pt states she took 41 units today (12/29/11)   Yes Historical Provider, MD  insulin detemir (LEVEMIR) 100 UNIT/ML injection Inject 20 Units into the skin at bedtime.   Yes Historical Provider,  MD  lisinopril-hydrochlorothiazide (PRINZIDE,ZESTORETIC) 20-25 MG per tablet Take 1 tablet by mouth daily.   Yes Historical Provider, MD  metFORMIN (GLUCOPHAGE) 1000 MG tablet Take 1,000 mg by mouth 2 (two) times daily with a meal.   Yes Historical Provider, MD  traMADol (ULTRAM) 50 MG tablet Take 50-100 mg by mouth every 6 (six) hours as needed. For pain   Yes Historical Provider, MD   Physical Exam: Filed Vitals:   05/21/12 1406 05/21/12 1641 05/21/12 1745  BP: 145/85 149/84 147/79  Pulse: 99 101 101  Temp: 98.2 F (36.8 C)    TempSrc: Oral    Resp: 16 18   SpO2: 100% 100% 100%     General:  AxOx3,   Eyes: PERRLA  ENT: clear  Neck: no JVD  Cardiovascular: RRR  Respiratory: CTAB  Abdomen: soft, NT  Skin: no rash  Musculoskeletal: normal  Psychiatric: euthymic  Neurologic: CN 2-12   Labs on Admission:  Basic Metabolic Panel:  Lab 05/21/12 8119  NA 122*  K 4.5  CL 85*  CO2 22  GLUCOSE 842*  BUN 20  CREATININE 0.70  CALCIUM 9.7  MG --  PHOS --   Liver Function Tests:  Lab 05/21/12 1423  AST 12  ALT 11  ALKPHOS 145*  BILITOT 0.4  PROT 8.1  ALBUMIN 3.9   No results found for this basename: LIPASE:5,AMYLASE:5 in the last 168 hours No results found for this basename: AMMONIA:5 in the last 168 hours CBC:  Lab 05/21/12 1423  WBC 7.2  NEUTROABS 5.5  HGB 11.0*  HCT 34.8*  MCV 69.7*  PLT  389   Cardiac Enzymes: No results found for this basename: CKTOTAL:5,CKMB:5,CKMBINDEX:5,TROPONINI:5 in the last 168 hours  BNP (last 3 results) No results found for this basename: PROBNP:3 in the last 8760 hours CBG:  Lab 05/21/12 1737 05/21/12 1634 05/21/12 1435  GLUCAP 473* >600* >600*    Radiological Exams on Admission: No results found.    Assessment/Plan Active Problems:  Hyperosmolar syndrome   1. HONK - start iv insulin and adjust according to gluco stabilizer . transition to generic [place] insulin once better   Rebecca [name] [facility] Pager [phone]  If 7PM-7AM, please contact night-coverage [email or web address] Password [email or web address] 05/21/2012, 6:30 PM

## 2012-05-21 NOTE — ED Provider Notes (Signed)
Medical screening examination/treatment/procedure(s) were conducted as a shared visit with non-physician practitioner(s) and myself.  I personally evaluated the patient during the encounter  Toy Baker, MD 05/21/12 [phone]

## 2012-05-21 NOTE — Progress Notes (Signed)
Rebecca [name] [id number] Admission Data: 05/21/2012 6:49 PM Attending Provider: Lorane Gell, MD [id number], Rich Reining, MD Code Status: full   Rebecca [name] is a 46 y.o. female patient admitted from ED  No acute distress noted.  No c/o shortness of breath, no c/o chest pain.  Cardiac tele # [phone], in place, cardiac monitor yields:sinus tachycardia.  Blood pressure 144/95, pulse 98, temperature 98.3 F (36.8 C), temperature source Oral, resp. rate 18, last menstrual period 05/16/2012, SpO2 100.00%.   IV Fluids:  IV in place, occlusive dsg intact without redness, IV cath hand right, condition patent and no redness normal saline. Insulin drip going as well.   Allergies:  Review of patient's allergies indicates no known allergies.  Past Medical History:   has a past medical history of Diabetes mellitus.  Past Surgical History:   has no past surgical history on file.  Social History:   reports that she has never smoked. She does not have any smokeless tobacco history on file. She reports that she does not drink alcohol or use illicit drugs.  Skin: intact    Will cont to eval and treat per MD orders.  Susann Givens, RN 05/21/2012 6:49 PM
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
## 2012-05-22 DIAGNOSIS — E1101 Type 2 diabetes mellitus with hyperosmolarity with coma: Secondary | ICD-10-CM | POA: Diagnosis present

## 2012-05-22 LAB — GLUCOSE, CAPILLARY: Glucose-Capillary: 324 mg/dL — ABNORMAL HIGH (ref 70–99)

## 2012-05-22 LAB — BASIC METABOLIC PANEL
BUN: 11 mg/dL (ref 6–23)
Calcium: 8.4 mg/dL (ref 8.4–10.5)
GFR calc Af Amer: >90 mL/min (ref >90)
GFR calc non Af Amer: >90 mL/min (ref >90)
Glucose, Bld: 307 mg/dL — ABNORMAL HIGH (ref 70–99)

## 2012-05-22 MED ORDER — INSULIN NPH (HUMAN) (ISOPHANE) 100 UNIT/ML ~~LOC~~ SUSP
30.0000 [IU] | Freq: Two times a day (BID) | SUBCUTANEOUS | Status: DC
  Administered 2012-05-22: 30 [IU] via SUBCUTANEOUS
  Filled 2012-05-22: qty 10

## 2012-05-22 MED ORDER — LISINOPRIL-HYDROCHLOROTHIAZIDE 20-25 MG PO TABS: 1.0000 | ORAL_TABLET | Freq: Every day | ORAL | Status: DC

## 2012-05-22 MED ORDER — METFORMIN HCL 1000 MG PO TABS: 1000.0000 mg | ORAL_TABLET | Freq: Every day | ORAL | Status: DC

## 2012-05-22 MED ORDER — INSULIN SYRINGES (DISPOSABLE) U-100 1 ML MISC: Status: DC

## 2012-05-22 MED ORDER — INSULIN NPH (HUMAN) (ISOPHANE) 100 UNIT/ML ~~LOC~~ SUSP: 30.0000 [IU] | Freq: Two times a day (BID) | SUBCUTANEOUS | Status: DC

## 2012-05-22 NOTE — Progress Notes (Signed)
Rebecca [name] to be D/C'd Home per MD order.  Discussed with the patient and all questions fully answered.   [name], [name]  Home Medication Instructions [id number]   Printed on:05/22/12 1121  Medication Information                    ferrous sulfate 325 (65 FE) MG tablet Take 325 mg by mouth 2 (two) times daily.           traMADol (ULTRAM) 50 MG tablet Take 50-100 mg by mouth every 6 (six) hours as needed. For pain           insulin NPH (HUMULIN N,NOVOLIN N) 100 UNIT/ML injection Inject 30 Units into the skin 2 (two) times daily.           lisinopril-hydrochlorothiazide (PRINZIDE,ZESTORETIC) 20-25 MG per tablet Take 1 tablet by mouth daily.           metFORMIN (GLUCOPHAGE) 1000 MG tablet Take 1 tablet (1,000 mg total) by mouth daily with breakfast.           Insulin Syringes, Disposable, U-100 1 ML MISC 100 insulin syringes             VVS, Skin clean, dry and intact without evidence of skin break down, no evidence of skin tears noted. IV catheter discontinued intact. Site without signs and symptoms of complications. Dressing and pressure applied.  An After Visit Summary was printed and given to the patient. Follow up appointments , new prescriptions and medication administration times given. Pt observed drawing up and giving NPH insulin. Diabetic information on diet, NPH insulin, foot care and monitoring blood sugars given in Spanish and son at bedside to translate. All questions answered. Patient escorted out, and D/C home via private auto.  Cindra Eves, RN 05/22/2012 11:21 AM

## 2012-05-22 NOTE — Discharge Summary (Signed)
Physician Discharge Summary  Rebecca [name] ZOX:[id number] DOB: 1966-04-15 DOA: 05/21/2012  PCP: Jearld Lesch, MD  Admit date: 05/21/2012 Discharge date: 05/22/2012  Discharge Diagnoses:    Hyperosmolar syndrome - resolved DM type 2   Discharge Condition: good  Diet recommendation: diabetic  Filed Weights   05/21/12 1847  Weight: 59.92 kg (132 lb 1.6 oz)    History of present illness:  Rebecca [name] is a 46 y.o. female with long history of diabetes who presented today to the ED with polydipsia, blurred vision, polyuria and weakness. She ran out of insulin a few days ago. In the ED the glucose level was more than 800.      Hospital Course:  1. Hyperosmolar syndrome - patient was admitted with severely uncontrolled diabetes due to lack of insulin. Despite the severe hyperglycemia the patient did not have DKA. We have transitioned the patient to NPH insulin twice  Day , resumed metformin and diabetic diet.   Procedures:  none  Consultations:  none  Discharge Exam: Filed Vitals:   05/22/12 0605  BP: 151/80  Pulse: 98  Temp: 97.8 F (36.6 C)  Resp: 16   Filed Vitals:   05/21/12 1745 05/21/12 1847 05/21/12 2003 05/22/12 0605  BP: 147/79 144/95 138/70 151/80  Pulse: 101 98 86 98  Temp:  98.3 F (36.8 C) 97.9 F (36.6 C) 97.8 F (36.6 C)  TempSrc:  Oral Oral Oral  Resp:  18 16 16   Height:  5\' 2"  (1.575 m)    Weight:  59.92 kg (132 lb 1.6 oz)    SpO2: 100% 100% 100% 100%    General: axox3 Cardiovascular: rrr Respiratory: ctab  Discharge Instructions  Discharge Orders    Future Orders Please Complete By Expires   Diet Carb Modified      Increase activity slowly        Medication List  As of 05/22/2012  9:03 AM   STOP taking these medications         insulin aspart protamine-insulin aspart (70-30) 100 UNIT/ML injection      insulin detemir 100 UNIT/ML injection         TAKE these medications         ferrous sulfate 325 (65 FE) MG tablet   Take 325 mg by mouth 2 (two) times daily.      insulin NPH 100 UNIT/ML injection   Commonly known as: HUMULIN N,NOVOLIN N   Inject 30 Units into the skin 2 (two) times daily.      Insulin Syringes (Disposable) U-100 1 ML Misc   100 insulin syringes      lisinopril-hydrochlorothiazide 20-25 MG per tablet   Commonly known as: PRINZIDE,ZESTORETIC   Take 1 tablet by mouth daily.      metFORMIN 1000 MG tablet   Commonly known as: GLUCOPHAGE   Take 1 tablet (1,000 mg total) by mouth daily with breakfast.      traMADol 50 MG tablet   Commonly known as: ULTRAM   Take 50-100 mg by mouth every 6 (six) hours as needed. For pain           Follow-up Information    Schedule an appointment as soon as possible for a visit with Jearld Lesch, MD.   Contact information:   [address] [place] [address] [phone]           The results of significant diagnostics from this hospitalization (including imaging, microbiology, ancillary and laboratory) are listed below for  reference.    Significant Diagnostic Studies: No results found.  Microbiology: No results found for this or any previous visit (from the past 240 hour(s)).   Labs: Basic Metabolic Panel:  Lab 05/22/12 1610 05/21/12 1423  NA 132* 122*  K 3.8 4.5  CL 100 85*  CO2 24 22  GLUCOSE 307* 842*  BUN 11 20  CREATININE 0.49* 0.70  CALCIUM 8.4 9.7  MG -- --  PHOS -- --   Liver Function Tests:  Lab 05/21/12 1423  AST 12  ALT 11  ALKPHOS 145*  BILITOT 0.4  PROT 8.1  ALBUMIN 3.9   No results found for this basename: LIPASE:5,AMYLASE:5 in the last 168 hours No results found for this basename: AMMONIA:5 in the last 168 hours CBC:  Lab 05/21/12 1423  WBC 7.2  NEUTROABS 5.5  HGB 11.0*  HCT 34.8*  MCV 69.7*  PLT 389   Cardiac Enzymes: No results found for this basename: CKTOTAL:5,CKMB:5,CKMBINDEX:5,TROPONINI:5 in the last 168 hours BNP: BNP (last 3 results) No results found for  this basename: PROBNP:3 in the last 8760 hours CBG:  Lab 05/22/12 0720 05/22/12 05/21/12 2304 05/21/12 2208 05/21/12 2102  GLUCAP 324* 142* 194* 179* 185*    Time coordinating discharge: 30 minutes  Signed:  Rosell Khouri  [facility] 05/22/2012, 9:03 AM

## 2012-08-16 ENCOUNTER — Emergency Department (HOSPITAL_COMMUNITY): Payer: Self-pay

## 2012-08-16 ENCOUNTER — Emergency Department (HOSPITAL_COMMUNITY)
Admission: EM | Admit: 2012-08-16 | Discharge: 2012-08-16 | Disposition: A | Discharge status: Home / Self Care | Payer: Self-pay | Attending: Emergency Medicine | Admitting: Emergency Medicine

## 2012-08-16 DIAGNOSIS — M542 Cervicalgia: Secondary | ICD-10-CM | POA: Insufficient documentation

## 2012-08-16 DIAGNOSIS — E119 Type 2 diabetes mellitus without complications: Secondary | ICD-10-CM | POA: Insufficient documentation

## 2012-08-16 DIAGNOSIS — Z79899 Other long term (current) drug therapy: Secondary | ICD-10-CM | POA: Insufficient documentation

## 2012-08-16 DIAGNOSIS — Z794 Long term (current) use of insulin: Secondary | ICD-10-CM | POA: Insufficient documentation

## 2012-08-16 LAB — CBC WITH DIFFERENTIAL/PLATELET
Basophils Relative: 0 % (ref 0–1)
Eosinophils Absolute: 0.1 10*3/uL (ref 0.0–0.7)
MCH: 25.3 pg — ABNORMAL LOW (ref 26.0–34.0)
MCHC: 33 g/dL (ref 30.0–36.0)
Monocytes Relative: 6 % (ref 3–12)
Neutrophils Relative %: 60 % (ref 43–77)
Platelets: 307 10*3/uL (ref 150–400)
RDW: 16.1 % — ABNORMAL HIGH (ref 11.5–15.5)

## 2012-08-16 LAB — GLUCOSE, CAPILLARY: Glucose-Capillary: 310 mg/dL — ABNORMAL HIGH (ref 70–99)

## 2012-08-16 LAB — COMPREHENSIVE METABOLIC PANEL
Albumin: 3.8 g/dL (ref 3.5–5.2)
Alkaline Phosphatase: 84 U/L (ref 39–117)
BUN: 37 mg/dL — ABNORMAL HIGH (ref 6–23)
Potassium: 4.3 mEq/L (ref 3.5–5.1)
Sodium: 135 mEq/L (ref 135–145)
Total Protein: 7.1 g/dL (ref 6.0–8.3)

## 2012-08-16 LAB — TROPONIN I: Troponin I: <0.3 ng/mL (ref <0.30)

## 2012-08-16 MED ORDER — KETOROLAC TROMETHAMINE 60 MG/2ML IM SOLN
60.0000 mg | Freq: Once | INTRAMUSCULAR | Status: AC
  Administered 2012-08-16: 60 mg via INTRAMUSCULAR
  Filled 2012-08-16: qty 2

## 2012-08-16 MED ORDER — HYDROCODONE-ACETAMINOPHEN 5-500 MG PO TABS: 1.0000 | ORAL_TABLET | Freq: Four times a day (QID) | ORAL | Status: DC | PRN

## 2012-08-16 NOTE — ED Provider Notes (Signed)
History     CSN: [id number]  Arrival date & time 08/16/12  2059   First MD Initiated Contact with Patient 08/16/12 2109      Chief Complaint  Patient presents with  . Back Pain    (Consider location/radiation/quality/duration/timing/severity/associated sxs/prior treatment) HPI Comments: The patient presents with a two day history of pain in the bottom of the neck, upper chest.  She denies any injury or trauma.  There is no shortness of breath, nausea, or diaphoresis.  She denies any exertional symptoms.    The patient has no prior cardiac history but does have a history of DM and HTN.  She does not speak english, only spanish.  The history is taken with the assistance of the daughter who acts as Nurse, learning disability.    Patient is a 46 y.o. female presenting with back pain. The history is provided by the patient.  Back Pain  This is a new problem. The current episode started yesterday. The problem occurs constantly. The problem has been gradually worsening. The pain is associated with no known injury. The pain is present in the thoracic spine. The quality of the pain is described as aching. The pain does not radiate. The pain is moderate. The symptoms are aggravated by bending. Pertinent negatives include no chest pain and no abdominal pain. The treatment provided no relief.    Past Medical History  Diagnosis Date  . Diabetes mellitus     No past surgical history on file.  Family History  Problem Relation Age of Onset  . Hyperlipidemia Father   . Hypertension Father   . Hyperlipidemia Father   . Hyperlipidemia Father   . Hyperlipidemia Father     History  Substance Use Topics  . Smoking status: Never Smoker   . Smokeless tobacco: Not on file  . Alcohol Use: No    OB History    Grav Para Term Preterm Abortions TAB SAB Ect Mult Living                  Review of Systems  Cardiovascular: Negative for chest pain.  Gastrointestinal: Negative for abdominal pain.    Musculoskeletal: Positive for back pain.  All other systems reviewed and are negative.    Allergies  No known allergies  Home Medications   Current Outpatient Rx  Name  Route  Sig  Dispense  Refill  . FERROUS SULFATE 325 (65 FE) MG PO TABS   Oral   Take 325 mg by mouth 3 (three) times daily with meals.          . INSULIN GLARGINE 100 UNIT/ML [place] SOLN   Subcutaneous   Inject 20 Units into the skin at bedtime.         . INSULIN LISPRO PROT & LISPRO (75-25) 100 UNIT/ML [place] SUSP   Subcutaneous   Inject 10 Units into the skin 3 (three) times daily with meals.         Marland Kitchen LISINOPRIL-HYDROCHLOROTHIAZIDE 20-25 MG PO TABS   Oral   Take 1 tablet by mouth daily.   30 tablet   3   . METFORMIN HCL 1000 MG PO TABS   Oral   Take 1 tablet (1,000 mg total) by mouth daily with breakfast.   30 tablet   3   . NAPROXEN SODIUM 550 MG PO TABS   Oral   Take 550 mg by mouth 2 (two) times daily as needed. For pain         . TAMSULOSIN HCL  0.4 MG PO CAPS   Oral   Take 0.4 mg by mouth daily. From [place] store:  [place]         . ZOLPIDEM TARTRATE 10 MG PO TABS   Oral   Take 10 mg by mouth at bedtime. For sleep           BP 127/77  Pulse 101  Temp 98 F (36.7 C) (Oral)  Resp 16  SpO2 99%  LMP 08/02/2012  Physical Exam  Nursing note and vitals reviewed. Constitutional: She is oriented to person, place, and time. She appears well-developed and well-nourished. No distress.  HENT:  Head: Normocephalic and atraumatic.  Neck: Normal range of motion. Neck supple.  Cardiovascular: Normal rate and regular rhythm.  Exam reveals no gallop and no friction rub.   No murmur heard. Pulmonary/Chest: Effort normal and breath sounds normal. No respiratory distress. She has no wheezes.  Abdominal: Soft. Bowel sounds are normal. She exhibits no distension. There is no tenderness.  Musculoskeletal: Normal range of motion.       There is mild ttp in the soft tissues in the lower cervical  spine and upper thoracic area.  There is no bony ttp and no stepoffs.  Lymphadenopathy:    She has no cervical adenopathy.  Neurological: She is alert and oriented to person, place, and time.  Skin: Skin is warm and dry. She is not diaphoretic.    ED Course  Procedures (including critical care time)  Labs Reviewed  GLUCOSE, CAPILLARY - Abnormal; Notable for the following:    Glucose-Capillary 310 (*)     All other components within normal limits  CBC WITH DIFFERENTIAL  COMPREHENSIVE METABOLIC PANEL  TROPONIN I   No results found.   No diagnosis found.   Date: 08/16/2012  Rate: 93  Rhythm: normal sinus rhythm  QRS Axis: normal  Intervals: normal  ST/T Wave abnormalities: normal  Conduction Disutrbances:none  Narrative Interpretation:   Old EKG Reviewed: unchanged    MDM  The patient presents here with atypical pain in the back of the neck.  The workup does not suggest a cardiac etiology.  I suspect this is musculoskeletal in nature.  She will be treated with pain meds, rest, and follow up prn if not improving.        Geoffery Lyons, MD 08/16/12 [phone]

## 2012-08-16 NOTE — ED Notes (Signed)
MD at bedside. 

## 2012-08-16 NOTE — ED Notes (Signed)
Patient transported to X-ray 

## 2012-08-16 NOTE — ED Notes (Signed)
Pt c/o lower back pain and feeling tired for 2 days.  Headache  And neck pain also.  lmp2 weeks ago

## 2013-03-03 ENCOUNTER — Emergency Department (INDEPENDENT_AMBULATORY_CARE_PROVIDER_SITE_OTHER)
Admission: EM | Admit: 2013-03-03 | Discharge: 2013-03-03 | Disposition: A | Discharge status: Nursing Home (Includes ICF) | Payer: Self-pay | Source: Home / Self Care

## 2013-03-03 ENCOUNTER — Encounter (HOSPITAL_COMMUNITY): Payer: Self-pay | Admitting: Emergency Medicine

## 2013-03-03 ENCOUNTER — Emergency Department (HOSPITAL_COMMUNITY)
Admission: EM | Admit: 2013-03-03 | Discharge: 2013-03-03 | Disposition: A | Discharge status: Home / Self Care | Payer: Self-pay | Attending: Emergency Medicine | Admitting: Emergency Medicine

## 2013-03-03 DIAGNOSIS — R42 Dizziness and giddiness: Secondary | ICD-10-CM | POA: Insufficient documentation

## 2013-03-03 DIAGNOSIS — Z794 Long term (current) use of insulin: Secondary | ICD-10-CM | POA: Insufficient documentation

## 2013-03-03 DIAGNOSIS — R5381 Other malaise: Secondary | ICD-10-CM | POA: Insufficient documentation

## 2013-03-03 DIAGNOSIS — R739 Hyperglycemia, unspecified: Secondary | ICD-10-CM

## 2013-03-03 DIAGNOSIS — R630 Anorexia: Secondary | ICD-10-CM | POA: Insufficient documentation

## 2013-03-03 DIAGNOSIS — R358 Other polyuria: Secondary | ICD-10-CM | POA: Insufficient documentation

## 2013-03-03 DIAGNOSIS — R5383 Other fatigue: Secondary | ICD-10-CM | POA: Insufficient documentation

## 2013-03-03 DIAGNOSIS — R7309 Other abnormal glucose: Secondary | ICD-10-CM

## 2013-03-03 DIAGNOSIS — R3589 Other polyuria: Secondary | ICD-10-CM | POA: Insufficient documentation

## 2013-03-03 DIAGNOSIS — IMO0001 Reserved for inherently not codable concepts without codable children: Secondary | ICD-10-CM

## 2013-03-03 DIAGNOSIS — R209 Unspecified disturbances of skin sensation: Secondary | ICD-10-CM | POA: Insufficient documentation

## 2013-03-03 DIAGNOSIS — E1169 Type 2 diabetes mellitus with other specified complication: Secondary | ICD-10-CM | POA: Insufficient documentation

## 2013-03-03 DIAGNOSIS — Z79899 Other long term (current) drug therapy: Secondary | ICD-10-CM | POA: Insufficient documentation

## 2013-03-03 DIAGNOSIS — R631 Polydipsia: Secondary | ICD-10-CM | POA: Insufficient documentation

## 2013-03-03 DIAGNOSIS — E1165 Type 2 diabetes mellitus with hyperglycemia: Secondary | ICD-10-CM

## 2013-03-03 DIAGNOSIS — IMO0002 Reserved for concepts with insufficient information to code with codable children: Secondary | ICD-10-CM | POA: Insufficient documentation

## 2013-03-03 DIAGNOSIS — E119 Type 2 diabetes mellitus without complications: Secondary | ICD-10-CM

## 2013-03-03 LAB — BASIC METABOLIC PANEL
CO2: 25 mEq/L (ref 19–32)
Calcium: 8.8 mg/dL (ref 8.4–10.5)
Chloride: 101 mEq/L (ref 96–112)
Creatinine, Ser: 0.56 mg/dL (ref 0.50–1.10)
Glucose, Bld: 281 mg/dL — ABNORMAL HIGH (ref 70–99)

## 2013-03-03 LAB — CBC WITH DIFFERENTIAL/PLATELET
Eosinophils Relative: 2 % (ref 0–5)
HCT: 32 % — ABNORMAL LOW (ref 36.0–46.0)
Hemoglobin: 11.6 g/dL — ABNORMAL LOW (ref 12.0–15.0)
Lymphocytes Relative: 35 % (ref 12–46)
Lymphs Abs: 2.6 10*3/uL (ref 0.7–4.0)
MCV: 75.1 fL — ABNORMAL LOW (ref 78.0–100.0)
Monocytes Absolute: 0.5 10*3/uL (ref 0.1–1.0)
RBC: 4.26 MIL/uL (ref 3.87–5.11)
WBC: 7.3 10*3/uL (ref 4.0–10.5)

## 2013-03-03 LAB — POCT URINALYSIS DIP (DEVICE)
Ketones, ur: 15 mg/dL — AB
Protein, ur: NEGATIVE mg/dL
Specific Gravity, Urine: <=1.005 (ref 1.005–1.030)

## 2013-03-03 LAB — POCT I-STAT, CHEM 8
BUN: 19 mg/dL (ref 6–23)
Chloride: 98 mEq/L (ref 96–112)
Creatinine, Ser: 0.8 mg/dL (ref 0.50–1.10)
Potassium: 4.6 mEq/L (ref 3.5–5.1)
Sodium: 132 mEq/L — ABNORMAL LOW (ref 135–145)
TCO2: 32 mmol/L (ref 0–100)

## 2013-03-03 MED ORDER — SODIUM CHLORIDE 0.9 % IV BOLUS (SEPSIS)
1000.0000 mL | Freq: Once | INTRAVENOUS | Status: AC
  Administered 2013-03-03: 1000 mL via INTRAVENOUS

## 2013-03-03 MED ORDER — INSULIN ASPART 100 UNIT/ML ~~LOC~~ SOLN
SUBCUTANEOUS | Status: AC
  Filled 2013-03-03: qty 1

## 2013-03-03 MED ORDER — INSULIN GLARGINE 100 UNIT/ML ~~LOC~~ SOLN: 20.0000 [IU] | Freq: Every day | SUBCUTANEOUS | Status: DC

## 2013-03-03 MED ORDER — SODIUM CHLORIDE 0.9 % IV SOLN
1000.0000 mL | Freq: Once | INTRAVENOUS | Status: AC
  Administered 2013-03-03: 1000 mL via INTRAVENOUS

## 2013-03-03 MED ORDER — INSULIN ASPART 100 UNIT/ML ~~LOC~~ SOLN
10.0000 [IU] | Freq: Once | SUBCUTANEOUS | Status: AC
  Administered 2013-03-03: 10 [IU] via SUBCUTANEOUS
  Filled 2013-03-03: qty 1

## 2013-03-03 MED ORDER — SODIUM CHLORIDE 0.9 % IV SOLN: Freq: Once | INTRAVENOUS | Status: DC

## 2013-03-03 MED ORDER — SODIUM CHLORIDE 0.9 % IV SOLN: 1000.0000 mL | INTRAVENOUS | Status: DC

## 2013-03-03 MED ORDER — INSULIN ASPART 100 UNIT/ML ~~LOC~~ SOLN
15.0000 [IU] | Freq: Once | SUBCUTANEOUS | Status: AC
  Administered 2013-03-03: 15 [IU] via SUBCUTANEOUS

## 2013-03-03 MED ORDER — INSULIN LISPRO PROT & LISPRO (75-25 MIX) 100 UNIT/ML ~~LOC~~ SUSP: 10.0000 [IU] | Freq: Three times a day (TID) | SUBCUTANEOUS | Status: DC

## 2013-03-03 NOTE — ED Provider Notes (Signed)
History     CSN: [id number]  Arrival date & time 03/03/13  1847   First MD Initiated Contact with Patient 03/03/13 1849      Chief Complaint  Patient presents with  . Hyperglycemia    (Consider location/radiation/quality/duration/timing/severity/associated sxs/prior treatment) HPI  47 year old female with history of insulin-dependent diabetes who ran out of her diabetic medication for 3 weeks presents complaining of fatigue, dizziness, polyuria and polydipsia. Patient was seen and evaluate at [facility] and sent here for further management of her diabetes. From Urgent Care note, patient recently received orange card and now she can get medication however she was unable to afford medication before. She is in the process of setting up with an appointment with her primary care provider in the community clinic but will not be seen until next week. She is scheduled to be seen on Thursday at [facility] clinic.  History was obtained through daughter who was at bedside. Patient reports having decrease in appetite, feeling fatigued, having burning sensation to both of her feet, and also having increased polyuria and polydipsia. She denies fever, chills, cough, sneezing, short of breath, chest pain, abdominal pain, back pain, dysuria.  Past Medical History  Diagnosis Date  . Diabetes mellitus     No past surgical history on file.  Family History  Problem Relation Age of Onset  . Hyperlipidemia Father   . Hypertension Father   . Hyperlipidemia Father   . Hyperlipidemia Father   . Hyperlipidemia Father     History  Substance Use Topics  . Smoking status: Never Smoker   . Smokeless tobacco: Not on file  . Alcohol Use: No    OB History   Grav Para Term Preterm Abortions TAB SAB Ect Mult Living                  Review of Systems  All other systems reviewed and are negative.    Allergies  No known allergies  Home Medications   Current Outpatient Rx  Name  Route   Sig  Dispense  Refill  . ferrous sulfate 325 (65 FE) MG tablet   Oral   Take 325 mg by mouth 3 (three) times daily with meals.          Marland Kitchen HYDROcodone-acetaminophen (VICODIN) 5-500 MG per tablet   Oral   Take 1-2 tablets by mouth every 6 (six) hours as needed for pain.   15 tablet   0   . insulin glargine (LANTUS) 100 UNIT/ML injection   Subcutaneous   Inject 20 Units into the skin at bedtime.         . insulin lispro protamine-insulin lispro (HUMALOG 75/25) (75-25) 100 UNIT/ML SUSP   Subcutaneous   Inject 10 Units into the skin 3 (three) times daily with meals.         Marland Kitchen lisinopril-hydrochlorothiazide (PRINZIDE,ZESTORETIC) 20-25 MG per tablet   Oral   Take 1 tablet by mouth daily.   30 tablet   3   . metFORMIN (GLUCOPHAGE) 1000 MG tablet   Oral   Take 1 tablet (1,000 mg total) by mouth daily with breakfast.   30 tablet   3   . naproxen sodium (ANAPROX) 550 MG tablet   Oral   Take 550 mg by mouth 2 (two) times daily as needed. For pain         . Tamsulosin HCl (FLOMAX) 0.4 MG CAPS   Oral   Take 0.4 mg by mouth daily. From  [place] store:  [employer]         . zolpidem (AMBIEN) 10 MG tablet   Oral   Take 10 mg by mouth at bedtime. For sleep           BP 153/85  Temp(Src) 97.6 F (36.4 C) (Oral)  Resp 14  Ht 5\' 2"  (1.575 m)  Wt 132 lb (59.875 kg)  BMI 24.14 kg/m2  SpO2 100%  LMP 02/22/2013  Physical Exam  Nursing note and vitals reviewed. Constitutional: She is oriented to person, place, and time. She appears well-developed and well-nourished. No distress.  Awake, alert, nontoxic appearance  HENT:  Head: Atraumatic.  Eyes: Conjunctivae are normal. Right eye exhibits no discharge. Left eye exhibits no discharge.  Neck: Neck supple.  Cardiovascular: Normal rate, regular rhythm and intact distal pulses.   Pulmonary/Chest: Effort normal. No respiratory distress. She exhibits no tenderness.  Abdominal: Soft. There is no tenderness. There is no rebound.   Musculoskeletal: She exhibits no tenderness.  ROM appears intact, no obvious focal weakness  Neurological: She is alert and oriented to person, place, and time.  Mental status and motor strength appears intact  Skin: No rash noted.  Psychiatric: She has a normal mood and affect.    ED Course  Procedures (including critical care time)  6:54 PM Patient with uncontrolled diabetes currently out of insulin for one week is here for further management of her diabetes. Initial CBG at urgent care today was 510. No evidence of anion gap on initial labs. Patient received 1 L bolus of normal saline and was given 15 units of subcutaneous Humalog. She sent here for further management of her hyperglycemia.    7:13 PM We'll continue with IV fluid, and we'll give 10 units of subcutaneous tenderness block he in the ED. Patient otherwise is afebrile with stable normal vital signs, and in no acute distress.  7:45 PM CBG improved to 281. No evidence of acidosis. Patient is stable for discharge. Will prescribe her insulin medication and she will have close followup on Thursday. Return precautions discussed. Discussed with attending.  Labs Reviewed  CBC WITH DIFFERENTIAL - Abnormal; Notable for the following:    Hemoglobin 11.6 (*)    HCT 32.0 (*)    MCV 75.1 (*)    MCHC 36.3 (*)    All other components within normal limits  BASIC METABOLIC PANEL - Abnormal; Notable for the following:    Sodium 134 (*)    Glucose, Bld 281 (*)    All other components within normal limits   No results found.   1. Diabetes mellitus   2. Hyperglycemia without ketosis       MDM  BP 148/82  Pulse 90  Temp(Src) 97.9 F (36.6 C) (Oral)  Resp 17  Ht 5\' 2"  (1.575 m)  Wt 132 lb (59.875 kg)  BMI 24.14 kg/m2  SpO2 100%  LMP 02/22/2013  I have reviewed nursing notes and vital signs.  I reviewed available ER/hospitalization records thought the EMR         Fayrene Helper, [place] 03/03/13 1951

## 2013-03-03 NOTE — ED Provider Notes (Signed)
History     CSN: [id number]  Arrival date & time 03/03/13  1534   None     Chief Complaint  Patient presents with  . Medication Refill    pt has been without insulin x 1 wk. feeling dizzy. decrease in appetite. fatigue  . Dizziness    (Consider location/radiation/quality/duration/timing/severity/associated sxs/prior treatment) HPI Comments: 47 year old female with history of type 2 diabetes mellitus presents complaining of dizziness, fatigue, polyuria, and polydipsia since running out of her insulin one week ago. She states that she just was able to get her orange card to now she will be able to afford her medications but she could not afford them before. She did try to set up a new appointment with a primary care provider [facility] but they would not be able to get her in until next week. She denies any vomiting, diarrhea, chest pain, shortness of breath, fever, or chills. She has still been taking her metformin. She also states she has not been able to eat in the past couple of days because she has no appetite.   Past Medical History  Diagnosis Date  . Diabetes mellitus     History reviewed. No pertinent past surgical history.  Family History  Problem Relation Age of Onset  . Hyperlipidemia Father   . Hypertension Father   . Hyperlipidemia Father   . Hyperlipidemia Father   . Hyperlipidemia Father     History  Substance Use Topics  . Smoking status: Never Smoker   . Smokeless tobacco: Not on file  . Alcohol Use: No    OB History   Grav Para Term Preterm Abortions TAB SAB Ect Mult Living                  Review of Systems  Constitutional: Positive for appetite change and fatigue. Negative for fever and chills.  Eyes: Negative for visual disturbance.  Respiratory: Negative for cough and shortness of breath.   Cardiovascular: Negative for chest pain, palpitations and leg swelling.  Gastrointestinal: Negative for nausea, vomiting and abdominal pain.   Endocrine: Positive for polydipsia and polyuria.  Genitourinary: Negative for dysuria and urgency.  Musculoskeletal: Negative for myalgias and arthralgias.  Skin: Negative for rash.  Neurological: Positive for dizziness and light-headedness. Negative for seizures, syncope, weakness and numbness.    Allergies  No known allergies  Home Medications   Current Outpatient Rx  Name  Route  Sig  Dispense  Refill  . insulin glargine (LANTUS) 100 UNIT/ML injection   Subcutaneous   Inject 20 Units into the skin at bedtime.         . insulin lispro protamine-insulin lispro (HUMALOG 75/25) (75-25) 100 UNIT/ML SUSP   Subcutaneous   Inject 10 Units into the skin 3 (three) times daily with meals.         . metFORMIN (GLUCOPHAGE) 1000 MG tablet   Oral   Take 1 tablet (1,000 mg total) by mouth daily with breakfast.   30 tablet   3   . ferrous sulfate 325 (65 FE) MG tablet   Oral   Take 325 mg by mouth 3 (three) times daily with meals.          Marland Kitchen HYDROcodone-acetaminophen (VICODIN) 5-500 MG per tablet   Oral   Take 1-2 tablets by mouth every 6 (six) hours as needed for pain.   15 tablet   0   . lisinopril-hydrochlorothiazide (PRINZIDE,ZESTORETIC) 20-25 MG per tablet   Oral   Take  1 tablet by mouth daily.   30 tablet   3   . naproxen sodium (ANAPROX) 550 MG tablet   Oral   Take 550 mg by mouth 2 (two) times daily as needed. For pain         . Tamsulosin HCl (FLOMAX) 0.4 MG CAPS   Oral   Take 0.4 mg by mouth daily. From [place] store:  [place]         . zolpidem (AMBIEN) 10 MG tablet   Oral   Take 10 mg by mouth at bedtime. For sleep           BP 116/77  Pulse 117  Temp(Src) 98.1 F (36.7 C) (Oral)  SpO2 100%  LMP 02/22/2013  Physical Exam  Nursing note and vitals reviewed. Constitutional: She is oriented to person, place, and time. Vital signs are normal. She appears well-developed and well-nourished. No distress.  Obese body habitus  HENT:  Head:  Atraumatic.  Eyes: EOM are normal. Pupils are equal, round, and reactive to light.  Cardiovascular: Normal rate, regular rhythm and normal heart sounds.  Exam reveals no gallop and no friction rub.   No murmur heard. Pulmonary/Chest: Effort normal and breath sounds normal. No respiratory distress. She has no wheezes. She has no rales.  Abdominal: Soft. There is no tenderness.  Neurological: She is alert and oriented to person, place, and time. She has normal strength.  Skin: Skin is warm and dry. She is not diaphoretic.  Psychiatric: She has a normal mood and affect. Her behavior is normal. Judgment normal.    ED Course  Procedures (including critical care time)  Labs Reviewed  GLUCOSE, CAPILLARY - Abnormal; Notable for the following:    Glucose-Capillary 358 (*)    All other components within normal limits  POCT URINALYSIS DIP (DEVICE) - Abnormal; Notable for the following:    Glucose, UA 500 (*)    Ketones, ur 15 (*)    Hgb urine dipstick TRACE (*)    All other components within normal limits  POCT I-STAT, CHEM 8 - Abnormal; Notable for the following:    Sodium 132 (*)    Glucose, Bld 510 (*)    Calcium, Ion 1.24 (*)    All other components within normal limits   No results found.   1. Diabetes type 2, uncontrolled   2. Hyperglycemia     Patient exhibits orthostatic hypotension in triage.  I-STAT reveals a glucose of 510 and a sodium of 132 otherwise normal. Started on a 1 L bolus of normal saline and given 15 units of subcutaneous Humalog.  On recheck after 1 L bolus, blood sugar still 358  MDM  Patient will return her to the emergency department for further treatment via [facility]   Meds ordered this encounter  Medications  . sodium chloride 0.9 % bolus 1,000 mL    Sig:   . insulin aspart (novoLOG) injection 15 Units    Sig:   . 0.9 %  sodium chloride infusion    Sig:            Graylon Good, PA-C 03/03/13 1819

## 2013-03-03 NOTE — ED Provider Notes (Signed)
Medical screening examination/treatment/procedure(s) were performed by non-physician practitioner and as supervising physician I was immediately available for consultation/collaboration.  Cassandr Cederberg, M.D.  Ashtin Melichar C Hayk Divis, MD 03/03/13 2023 

## 2013-03-03 NOTE — ED Notes (Addendum)
Pt [facility] for hyperglycemia. Pt reports out of insulin x1 week. 20g Rt AC. Nausea the last few days. NAD on arrival. 15u NovaLog given SQ. Last CBG at 1805 253

## 2013-03-03 NOTE — ED Notes (Signed)
Pt reports feeling dizzy and weak x 1wk. Decrease in appetite. Pt states that she has been out of insulin for a week.  Pt denies any other symptoms.

## 2013-03-03 NOTE — ED Notes (Signed)
The patient is AOx4 and comfortable with the discharge instructions. 

## 2013-03-03 NOTE — ED Provider Notes (Signed)
Medical screening examination/treatment/procedure(s) were performed by non-physician practitioner and as supervising physician I was immediately available for consultation/collaboration.   Gilda Crease, MD 03/03/13 1958

## 2014-10-19 ENCOUNTER — Encounter (HOSPITAL_COMMUNITY): Payer: Self-pay | Admitting: Emergency Medicine

## 2014-10-19 ENCOUNTER — Emergency Department (HOSPITAL_COMMUNITY)
Admission: EM | Admit: 2014-10-19 | Discharge: 2014-10-19 | Disposition: A | Discharge status: Home / Self Care | Payer: PRIVATE HEALTH INSURANCE | Attending: Emergency Medicine | Admitting: Emergency Medicine

## 2014-10-19 DIAGNOSIS — Z794 Long term (current) use of insulin: Secondary | ICD-10-CM | POA: Insufficient documentation

## 2014-10-19 DIAGNOSIS — E119 Type 2 diabetes mellitus without complications: Secondary | ICD-10-CM | POA: Insufficient documentation

## 2014-10-19 DIAGNOSIS — Y9389 Activity, other specified: Secondary | ICD-10-CM | POA: Insufficient documentation

## 2014-10-19 DIAGNOSIS — W57XXXA Bitten or stung by nonvenomous insect and other nonvenomous arthropods, initial encounter: Secondary | ICD-10-CM | POA: Insufficient documentation

## 2014-10-19 DIAGNOSIS — Z79899 Other long term (current) drug therapy: Secondary | ICD-10-CM | POA: Insufficient documentation

## 2014-10-19 DIAGNOSIS — S70362A Insect bite (nonvenomous), left thigh, initial encounter: Secondary | ICD-10-CM | POA: Insufficient documentation

## 2014-10-19 DIAGNOSIS — Y998 Other external cause status: Secondary | ICD-10-CM | POA: Insufficient documentation

## 2014-10-19 DIAGNOSIS — Y9289 Other specified places as the place of occurrence of the external cause: Secondary | ICD-10-CM | POA: Insufficient documentation

## 2014-10-19 MED ORDER — CEPHALEXIN 500 MG PO CAPS: 500.0000 mg | ORAL_CAPSULE | Freq: Four times a day (QID) | ORAL | Status: DC

## 2014-10-19 NOTE — ED Notes (Signed)
Pt. presents with infected skin abrasion at posterior left thigh onset Saturday with redness / swelling , no drainage .

## 2014-10-19 NOTE — Discharge Instructions (Signed)

## 2014-10-19 NOTE — ED Provider Notes (Signed)
CSN: [id number]     Arrival date & time 10/19/14  0037 History   First MD Initiated Contact with Patient 10/19/14 0045     Chief Complaint  Patient presents with  . Abrasion     (Consider location/radiation/quality/duration/timing/severity/associated sxs/prior Treatment) HPI Comments: Patient with past medical history of diabetes presents to the emergency department with chief complaints of insect bite to her left posterior thigh. She states that she noticed the wound on Saturday, and has noticed some progressing redness and swelling. She denies any drainage. She denies any fevers or chills. She has not tried taking anything to alleviate the symptoms. She did not see anything bite her. There are no aggravating or alleviating factors.  The history is provided by the patient. No language interpreter was used.    Past Medical History  Diagnosis Date  . Diabetes mellitus    History reviewed. No pertinent past surgical history. Family History  Problem Relation Age of Onset  . Hyperlipidemia Father   . Hypertension Father   . Hyperlipidemia Father   . Hyperlipidemia Father   . Hyperlipidemia Father    History  Substance Use Topics  . Smoking status: Never Smoker   . Smokeless tobacco: Not on file  . Alcohol Use: No   OB History    No data available     Review of Systems  Constitutional: Negative for fever and chills.  Respiratory: Negative for shortness of breath.   Cardiovascular: Negative for chest pain.  Gastrointestinal: Negative for nausea, vomiting, diarrhea and constipation.  Genitourinary: Negative for dysuria.  Skin: Positive for wound.  All other systems reviewed and are negative.     Allergies  No known allergies  Home Medications   Prior to Admission medications   Medication Sig Start Date End Date Taking? Authorizing Provider  ferrous sulfate 325 (65 FE) MG tablet Take 325 mg by mouth 3 (three) times daily with meals.     Historical Provider, MD    insulin glargine (LANTUS) 100 UNIT/ML injection Inject 0.2 mLs (20 Units total) into the skin at bedtime. 03/03/13   Fayrene Helper, PA-C  insulin lispro protamine-lispro (HUMALOG 75/25) (75-25) 100 UNIT/ML SUSP Inject 10 Units into the skin 3 (three) times daily with meals. 03/03/13   Fayrene Helper, PA-C  naproxen sodium (ANAPROX) 550 MG tablet Take 550 mg by mouth 2 (two) times daily as needed. For pain    Historical Provider, MD   BP 129/78 mmHg  Pulse 98  Temp(Src) 98.2 F (36.8 C)  Resp 14  SpO2 98% Physical Exam  Constitutional: She is oriented to person, place, and time. She appears well-developed and well-nourished.  HENT:  Head: Normocephalic and atraumatic.  Eyes: Conjunctivae and EOM are normal.  Neck: Normal range of motion.  Cardiovascular: Normal rate.   Pulmonary/Chest: Effort normal.  Abdominal: She exhibits no distension.  Musculoskeletal: Normal range of motion.  Neurological: She is alert and oriented to person, place, and time.  Skin: Skin is dry.  Skin lesion as pictured below, mild ulceration, no discharge drainage, no erythema  Psychiatric: She has a normal mood and affect. Her behavior is normal. Judgment and thought content normal.  Nursing note and vitals reviewed.   ED Course  Procedures (including critical care time) Labs Review Labs Reviewed - No data to display  Imaging Review No results found.   EKG Interpretation None        MDM   Final diagnoses:  Insect bite    Patient with small ulceration  and possible insect bite. No discharge drainage. I will start the patient on Keflex. Recommend follow-up with primary care provider. Patient also mentions that she has a history of anemia, and states that she has had some dizziness and lightheadedness from time to time over the past 4-5 months. She denies any currently. Vital signs are stable. Will recommend primary care follow-up. Patient is stable and ready for discharge. She is well-appearing. Not in  any apparent distress.    Roxy Horsemanobert Parish Dubose, PA-C 10/19/14 [phone]  Olivia Mackielga M Otter, MD 10/19/14 0630

## 2014-10-24 ENCOUNTER — Observation Stay (HOSPITAL_COMMUNITY)
Admission: EM | Admit: 2014-10-24 | Discharge: 2014-10-26 | Disposition: A | Discharge status: Home / Self Care | Payer: Self-pay | Attending: Internal Medicine | Admitting: Internal Medicine

## 2014-10-24 ENCOUNTER — Encounter (HOSPITAL_COMMUNITY): Payer: Self-pay | Admitting: Emergency Medicine

## 2014-10-24 DIAGNOSIS — I10 Essential (primary) hypertension: Secondary | ICD-10-CM | POA: Insufficient documentation

## 2014-10-24 DIAGNOSIS — Z9114 Patient's other noncompliance with medication regimen: Secondary | ICD-10-CM | POA: Insufficient documentation

## 2014-10-24 DIAGNOSIS — Z794 Long term (current) use of insulin: Secondary | ICD-10-CM | POA: Insufficient documentation

## 2014-10-24 DIAGNOSIS — E1165 Type 2 diabetes mellitus with hyperglycemia: Principal | ICD-10-CM | POA: Insufficient documentation

## 2014-10-24 DIAGNOSIS — IMO0002 Reserved for concepts with insufficient information to code with codable children: Secondary | ICD-10-CM | POA: Insufficient documentation

## 2014-10-24 DIAGNOSIS — Z91199 Patient's noncompliance with other medical treatment and regimen due to unspecified reason: Secondary | ICD-10-CM

## 2014-10-24 DIAGNOSIS — E860 Dehydration: Secondary | ICD-10-CM | POA: Insufficient documentation

## 2014-10-24 DIAGNOSIS — H538 Other visual disturbances: Secondary | ICD-10-CM | POA: Insufficient documentation

## 2014-10-24 DIAGNOSIS — Z79899 Other long term (current) drug therapy: Secondary | ICD-10-CM | POA: Insufficient documentation

## 2014-10-24 DIAGNOSIS — N179 Acute kidney failure, unspecified: Secondary | ICD-10-CM | POA: Insufficient documentation

## 2014-10-24 DIAGNOSIS — D649 Anemia, unspecified: Secondary | ICD-10-CM | POA: Insufficient documentation

## 2014-10-24 DIAGNOSIS — Z9119 Patient's noncompliance with other medical treatment and regimen: Secondary | ICD-10-CM

## 2014-10-24 DIAGNOSIS — R739 Hyperglycemia, unspecified: Secondary | ICD-10-CM

## 2014-10-24 DIAGNOSIS — S70312A Abrasion, left thigh, initial encounter: Secondary | ICD-10-CM | POA: Diagnosis present

## 2014-10-24 LAB — CBC WITH DIFFERENTIAL/PLATELET
BASOS ABS: 0 10*3/uL (ref 0.0–0.1)
Basophils Relative: 1 % (ref 0–1)
Eosinophils Absolute: 0.2 10*3/uL (ref 0.0–0.7)
Eosinophils Relative: 3 % (ref 0–5)
HEMATOCRIT: 31.7 % — AB (ref 36.0–46.0)
HEMOGLOBIN: 11.2 g/dL — AB (ref 12.0–15.0)
Lymphocytes Relative: 35 % (ref 12–46)
Lymphs Abs: 2.5 10*3/uL (ref 0.7–4.0)
MCH: 27.6 pg (ref 26.0–34.0)
MCHC: 35.3 g/dL (ref 30.0–36.0)
MCV: 78.1 fL (ref 78.0–100.0)
MONOS PCT: 8 % (ref 3–12)
Monocytes Absolute: 0.6 10*3/uL (ref 0.1–1.0)
NEUTROS ABS: 3.9 10*3/uL (ref 1.7–7.7)
NEUTROS PCT: 53 % (ref 43–77)
Platelets: 378 10*3/uL (ref 150–400)
RBC: 4.06 MIL/uL (ref 3.87–5.11)
RDW: 14.2 % (ref 11.5–15.5)
WBC: 7.2 10*3/uL (ref 4.0–10.5)

## 2014-10-24 LAB — BASIC METABOLIC PANEL
ANION GAP: 9 (ref 5–15)
BUN: 47 mg/dL — ABNORMAL HIGH (ref 6–23)
CALCIUM: 9.3 mg/dL (ref 8.4–10.5)
CHLORIDE: 101 mmol/L (ref 96–112)
CO2: 24 mmol/L (ref 19–32)
Creatinine, Ser: 1.53 mg/dL — ABNORMAL HIGH (ref 0.50–1.10)
GFR, EST AFRICAN AMERICAN: 45 mL/min — AB (ref >90)
GFR, EST NON AFRICAN AMERICAN: 39 mL/min — AB (ref >90)
GLUCOSE: 215 mg/dL — AB (ref 70–99)
POTASSIUM: 4.1 mmol/L (ref 3.5–5.1)
Sodium: 134 mmol/L — ABNORMAL LOW (ref 135–145)

## 2014-10-24 LAB — CBG MONITORING, ED: GLUCOSE-CAPILLARY: 246 mg/dL — AB (ref 70–99)

## 2014-10-24 MED ORDER — SODIUM CHLORIDE 0.9 % IV BOLUS (SEPSIS)
1000.0000 mL | Freq: Once | INTRAVENOUS | Status: AC
  Administered 2014-10-24: 1000 mL via INTRAVENOUS

## 2014-10-24 NOTE — ED Notes (Addendum)
Pt family reports that pts blood sugar has been reading "high" on their home device. Pt takes insulin and Metformin as prescribed. Pt reports she is feeling dizzy and sleepy. Pt seen here last week for infected bite on leg.

## 2014-10-24 NOTE — ED Provider Notes (Signed)
CSN: [id number]     Arrival date & time 10/24/14  2153 History   First MD Initiated Contact with Patient 10/24/14 2210     Chief Complaint  Patient presents with  . Hyperglycemia     (Consider location/radiation/quality/duration/timing/severity/associated sxs/prior Treatment) HPI Comments: The patient is a 49 year old female with poorly controlled diabetes due to noncompliance, hypertension, presenting with hyperglycemia complaints.  The patient is daughter reports the patient has not been "feeling well" for the last 15 days. She reports taking insulin and metformin only when she doesn't feel well. She reports not taking her CBG for the last 15 days, upon taking it today it was too high for the meter to read. She reports taking 10 units of insulin at approximately 2100. Patient reports not feeling well as lightheadedness, blurry vision, headache, neuropathy. Patient reports blurry vision for several weeks, has not resolved. Last A1c several months ago unsure value. Currently menstruating.  Patient was seen 10/19/14 for a possible spider bite. Patient's daughter reports area has drained since.  PCP [facility]  The history is provided by the patient and a relative. A language interpreter was used.    Past Medical History  Diagnosis Date  . Diabetes mellitus    History reviewed. No pertinent past surgical history. Family History  Problem Relation Age of Onset  . Hyperlipidemia Father   . Hypertension Father   . Hyperlipidemia Father   . Hyperlipidemia Father   . Hyperlipidemia Father    History  Substance Use Topics  . Smoking status: Never Smoker   . Smokeless tobacco: Not on file  . Alcohol Use: No   OB History    No data available     Review of Systems  Constitutional: Negative for fever and chills.  Eyes: Positive for visual disturbance. Negative for pain and redness.  Respiratory: Negative for cough and shortness of breath.   Gastrointestinal: Negative for nausea, vomiting,  abdominal pain and diarrhea.  Neurological: Positive for light-headedness and headaches.      Allergies  No known allergies  Home Medications   Prior to Admission medications   Medication Sig Start Date End Date Taking? Authorizing Provider  cephALEXin (KEFLEX) 500 MG capsule Take 1 capsule (500 mg total) by mouth 4 (four) times daily. 10/19/14   Roxy Horsemanobert Browning, PA-C  ferrous sulfate 325 (65 FE) MG tablet Take 325 mg by mouth 3 (three) times daily with meals.     Historical Provider, MD  insulin glargine (LANTUS) 100 UNIT/ML injection Inject 0.2 mLs (20 Units total) into the skin at bedtime. 03/03/13   Fayrene HelperBowie Tran, PA-C  insulin lispro protamine-lispro (HUMALOG 75/25) (75-25) 100 UNIT/ML SUSP Inject 10 Units into the skin 3 (three) times daily with meals. 03/03/13   Fayrene HelperBowie Tran, PA-C  naproxen sodium (ANAPROX) 550 MG tablet Take 550 mg by mouth 2 (two) times daily as needed. For pain    Historical Provider, MD   BP 119/73 mmHg  Pulse 104  Temp(Src) 98.1 F (36.7 C)  Resp 20  SpO2 100% Physical Exam  Constitutional: She is oriented to person, place, and time. She appears well-developed and well-nourished. No distress.  HENT:  Head: Normocephalic and atraumatic.  Neck: Neck supple.  Cardiovascular: Normal rate and regular rhythm.   Pulmonary/Chest: Effort normal. No respiratory distress.  Abdominal: Soft. There is no tenderness. There is no rebound and no guarding.  Neurological: She is alert and oriented to person, place, and time.  Skin: Skin is warm and dry. She is not diaphoretic.  Patient with multiple scabbed lesions to left posterior thigh with minimal surrounding erythema, tender to palpation no obvious drainage or fluctuance. No red streaking.  Psychiatric: She has a normal mood and affect. Her behavior is normal.  Nursing note and vitals reviewed.   ED Course  Procedures (including critical care time) Labs Review Labs Reviewed  CBC WITH DIFFERENTIAL/PLATELET -  Abnormal; Notable for the following:    Hemoglobin 11.2 (*)    HCT 31.7 (*)    All other components within normal limits  BASIC METABOLIC PANEL - Abnormal; Notable for the following:    Sodium 134 (*)    Glucose, Bld 215 (*)    BUN 47 (*)    Creatinine, Ser 1.53 (*)    GFR calc non Af Amer 39 (*)    GFR calc Af Amer 45 (*)    All other components within normal limits  CBG MONITORING, ED - Abnormal; Notable for the following:    Glucose-Capillary 246 (*)    All other components within normal limits  CBG MONITORING, ED    Imaging Review No results found.   EKG Interpretation None      MDM   Final diagnoses:  Acute kidney injury  Hyperglycemia  Medical non-compliance   Patient with poor/noncompliance with diabetes medication presents with hyperglycemia, CBG here 246. Patient in no acute distress, however given history of symptoms and poor compliance plan to order CBC and BMP. Lectured patient on importance of CBG monitoring, compliance with medication due to increased risk of stroke, infection, CAD, neuropathy. BMP shows glucose of 2:15, elevation of BUN and creatinine. Creatinine 1.53, elevated from .56 almost 2 years ago. Questionable from dehydration or diabetic CKD. Discussed with Dr. Freida Busman, advises admission for acute kidney injury. Plan to admit for medicine for hydration, diabetes education. 1610: Paged hospitalist [phone] Discussed with Dr. Lovell Sheehan, agrees to admit the patient.  Mellody Drown, PA-C 10/25/14 0124  Toy Baker, MD 10/29/14 [phone]

## 2014-10-25 DIAGNOSIS — N179 Acute kidney failure, unspecified: Secondary | ICD-10-CM

## 2014-10-25 DIAGNOSIS — E1165 Type 2 diabetes mellitus with hyperglycemia: Secondary | ICD-10-CM

## 2014-10-25 DIAGNOSIS — S70312D Abrasion, left thigh, subsequent encounter: Secondary | ICD-10-CM

## 2014-10-25 DIAGNOSIS — R739 Hyperglycemia, unspecified: Secondary | ICD-10-CM

## 2014-10-25 DIAGNOSIS — S70312A Abrasion, left thigh, initial encounter: Secondary | ICD-10-CM | POA: Diagnosis present

## 2014-10-25 DIAGNOSIS — IMO0002 Reserved for concepts with insufficient information to code with codable children: Secondary | ICD-10-CM | POA: Insufficient documentation

## 2014-10-25 LAB — URINE MICROSCOPIC-ADD ON

## 2014-10-25 LAB — URINALYSIS, ROUTINE W REFLEX MICROSCOPIC
BILIRUBIN URINE: NEGATIVE
GLUCOSE, UA: 500 mg/dL — AB
Ketones, ur: NEGATIVE mg/dL
LEUKOCYTES UA: NEGATIVE
Nitrite: NEGATIVE
PROTEIN: 30 mg/dL — AB
SPECIFIC GRAVITY, URINE: 1.015 (ref 1.005–1.030)
Urobilinogen, UA: 0.2 mg/dL (ref 0.0–1.0)
pH: 5 (ref 5.0–8.0)

## 2014-10-25 LAB — BASIC METABOLIC PANEL
Anion gap: 8 (ref 5–15)
BUN: 37 mg/dL — AB (ref 6–23)
CHLORIDE: 106 mmol/L (ref 96–112)
CO2: 22 mmol/L (ref 19–32)
CREATININE: 1.08 mg/dL (ref 0.50–1.10)
Calcium: 8.5 mg/dL (ref 8.4–10.5)
GFR calc non Af Amer: 60 mL/min — ABNORMAL LOW (ref >90)
GFR, EST AFRICAN AMERICAN: 69 mL/min — AB (ref >90)
Glucose, Bld: 362 mg/dL — ABNORMAL HIGH (ref 70–99)
Potassium: 4.7 mmol/L (ref 3.5–5.1)
Sodium: 136 mmol/L (ref 135–145)

## 2014-10-25 LAB — GLUCOSE, CAPILLARY
GLUCOSE-CAPILLARY: 408 mg/dL — AB (ref 70–99)
Glucose-Capillary: 325 mg/dL — ABNORMAL HIGH (ref 70–99)
Glucose-Capillary: 375 mg/dL — ABNORMAL HIGH (ref 70–99)
Glucose-Capillary: 186 mg/dL — ABNORMAL HIGH (ref 70–99)
Glucose-Capillary: 106 mg/dL — ABNORMAL HIGH (ref 70–99)

## 2014-10-25 LAB — CBC
HCT: 30.2 % — ABNORMAL LOW (ref 36.0–46.0)
Hemoglobin: 10.5 g/dL — ABNORMAL LOW (ref 12.0–15.0)
MCH: 27.5 pg (ref 26.0–34.0)
MCHC: 34.8 g/dL (ref 30.0–36.0)
MCV: 79.1 fL (ref 78.0–100.0)
Platelets: 346 10*3/uL (ref 150–400)
RBC: 3.82 MIL/uL — ABNORMAL LOW (ref 3.87–5.11)
RDW: 14.2 % (ref 11.5–15.5)
WBC: 8.3 10*3/uL (ref 4.0–10.5)

## 2014-10-25 LAB — PREGNANCY, URINE: PREG TEST UR: NEGATIVE

## 2014-10-25 MED ORDER — GLIPIZIDE 5 MG PO TABS
5.0000 mg | ORAL_TABLET | Freq: Two times a day (BID) | ORAL | Status: DC
  Administered 2014-10-25 – 2014-10-26 (×3): 5 mg via ORAL
  Filled 2014-10-25 (×5): qty 1

## 2014-10-25 MED ORDER — INSULIN ASPART 100 UNIT/ML ~~LOC~~ SOLN
0.0000 [IU] | Freq: Three times a day (TID) | SUBCUTANEOUS | Status: DC
  Administered 2014-10-25: 15 [IU] via SUBCUTANEOUS
  Administered 2014-10-25 – 2014-10-26 (×2): 3 [IU] via SUBCUTANEOUS
  Administered 2014-10-26: 5 [IU] via SUBCUTANEOUS

## 2014-10-25 MED ORDER — INSULIN ASPART 100 UNIT/ML ~~LOC~~ SOLN: 4.0000 [IU] | Freq: Three times a day (TID) | SUBCUTANEOUS | Status: DC

## 2014-10-25 MED ORDER — OXYCODONE HCL 5 MG PO TABS: 5.0000 mg | ORAL_TABLET | Freq: Four times a day (QID) | ORAL | Status: DC | PRN

## 2014-10-25 MED ORDER — FERROUS SULFATE 325 (65 FE) MG PO TABS
325.0000 mg | ORAL_TABLET | Freq: Three times a day (TID) | ORAL | Status: DC
  Administered 2014-10-25 – 2014-10-26 (×5): 325 mg via ORAL
  Filled 2014-10-25 (×7): qty 1

## 2014-10-25 MED ORDER — SODIUM CHLORIDE 0.9 % IV SOLN: INTRAVENOUS | Status: DC

## 2014-10-25 MED ORDER — INSULIN ASPART 100 UNIT/ML ~~LOC~~ SOLN
0.0000 [IU] | Freq: Every day | SUBCUTANEOUS | Status: DC
  Administered 2014-10-25: 3 [IU] via SUBCUTANEOUS

## 2014-10-25 MED ORDER — INSULIN DETEMIR 100 UNIT/ML ~~LOC~~ SOLN
40.0000 [IU] | Freq: Every day | SUBCUTANEOUS | Status: DC
Start: 2014-10-25 — End: 2014-10-25
  Filled 2014-10-25: qty 0.4

## 2014-10-25 MED ORDER — INSULIN ASPART 100 UNIT/ML ~~LOC~~ SOLN: 10.0000 [IU] | Freq: Every day | SUBCUTANEOUS | Status: DC

## 2014-10-25 MED ORDER — ACETAMINOPHEN 650 MG RE SUPP: 650.0000 mg | Freq: Four times a day (QID) | RECTAL | Status: DC | PRN

## 2014-10-25 MED ORDER — BACITRACIN-NEOMYCIN-POLYMYXIN OINTMENT TUBE
TOPICAL_OINTMENT | Freq: Two times a day (BID) | CUTANEOUS | Status: DC
  Administered 2014-10-25 (×3): via TOPICAL
  Filled 2014-10-25: qty 15
  Filled 2014-10-25 (×3): qty 1

## 2014-10-25 MED ORDER — ENOXAPARIN SODIUM 30 MG/0.3ML ~~LOC~~ SOLN
30.0000 mg | SUBCUTANEOUS | Status: DC
  Administered 2014-10-25: 30 mg via SUBCUTANEOUS
  Filled 2014-10-25 (×2): qty 0.3

## 2014-10-25 MED ORDER — ALUM & MAG HYDROXIDE-SIMETH 200-200-20 MG/5ML PO SUSP: 30.0000 mL | Freq: Four times a day (QID) | ORAL | Status: DC | PRN

## 2014-10-25 MED ORDER — INSULIN ASPART 100 UNIT/ML ~~LOC~~ SOLN
6.0000 [IU] | Freq: Three times a day (TID) | SUBCUTANEOUS | Status: DC
  Administered 2014-10-25 – 2014-10-26 (×3): 6 [IU] via SUBCUTANEOUS

## 2014-10-25 MED ORDER — GLUCERNA SHAKE PO LIQD
237.0000 mL | Freq: Every day | ORAL | Status: DC
  Administered 2014-10-25: 237 mL via ORAL

## 2014-10-25 MED ORDER — ACETAMINOPHEN 325 MG PO TABS: 650.0000 mg | ORAL_TABLET | Freq: Four times a day (QID) | ORAL | Status: DC | PRN

## 2014-10-25 MED ORDER — HYDROMORPHONE HCL 1 MG/ML IJ SOLN: 0.5000 mg | INTRAMUSCULAR | Status: DC | PRN

## 2014-10-25 MED ORDER — INSULIN DETEMIR 100 UNIT/ML ~~LOC~~ SOLN
40.0000 [IU] | Freq: Every day | SUBCUTANEOUS | Status: DC
Start: 2014-10-25 — End: 2014-10-26
  Administered 2014-10-25 – 2014-10-26 (×2): 40 [IU] via SUBCUTANEOUS
  Filled 2014-10-25 (×2): qty 0.4

## 2014-10-25 MED ORDER — INSULIN ASPART 100 UNIT/ML ~~LOC~~ SOLN
0.0000 [IU] | Freq: Three times a day (TID) | SUBCUTANEOUS | Status: DC
  Administered 2014-10-25: 9 [IU] via SUBCUTANEOUS

## 2014-10-25 MED ORDER — INFLUENZA VAC SPLIT QUAD 0.5 ML IM SUSY
0.5000 mL | PREFILLED_SYRINGE | INTRAMUSCULAR | Status: AC
  Administered 2014-10-26: 0.5 mL via INTRAMUSCULAR
  Filled 2014-10-25: qty 0.5

## 2014-10-25 MED ORDER — SODIUM CHLORIDE 0.9 % IV SOLN
INTRAVENOUS | Status: DC
  Administered 2014-10-25: 03:00:00 via INTRAVENOUS

## 2014-10-25 MED ORDER — OXYCODONE HCL 5 MG PO TABS: 5.0000 mg | ORAL_TABLET | ORAL | Status: DC | PRN

## 2014-10-25 MED ORDER — INSULIN ASPART 100 UNIT/ML ~~LOC~~ SOLN: 0.0000 [IU] | Freq: Every day | SUBCUTANEOUS | Status: DC

## 2014-10-25 MED ORDER — LIVING WELL WITH DIABETES BOOK
Freq: Once | Status: AC
  Administered 2014-10-25: 23:00:00
  Filled 2014-10-25 (×2): qty 1

## 2014-10-25 MED ORDER — LIVING WELL WITH DIABETES BOOK - IN SPANISH
Freq: Once | Status: AC
  Administered 2014-10-25: 23:00:00
  Filled 2014-10-25 (×2): qty 1

## 2014-10-25 MED ORDER — GABAPENTIN 600 MG PO TABS
600.0000 mg | ORAL_TABLET | Freq: Three times a day (TID) | ORAL | Status: DC
  Administered 2014-10-25 – 2014-10-26 (×4): 600 mg via ORAL
  Filled 2014-10-25 (×6): qty 1

## 2014-10-25 MED ORDER — ONDANSETRON HCL 4 MG/2ML IJ SOLN: 4.0000 mg | Freq: Four times a day (QID) | INTRAMUSCULAR | Status: DC | PRN

## 2014-10-25 MED ORDER — PNEUMOCOCCAL VAC POLYVALENT 25 MCG/0.5ML IJ INJ
0.5000 mL | INJECTION | INTRAMUSCULAR | Status: AC
  Administered 2014-10-26: 0.5 mL via INTRAMUSCULAR
  Filled 2014-10-25: qty 0.5

## 2014-10-25 MED ORDER — ONDANSETRON HCL 4 MG PO TABS: 4.0000 mg | ORAL_TABLET | Freq: Four times a day (QID) | ORAL | Status: DC | PRN

## 2014-10-25 NOTE — Progress Notes (Signed)
Consult Note: Pt is non-english speaking and prefers to use her daughter as an Equities traderinterpreter. Spoke with patient and her daughter about diabetes and her home regimen for diabetes control. [facility] and see Dr. Roxy CedarJim Anemone, which is her PCP. She is currently prescribed Metformin 1,000 BID, Glipizide 5mg  BID, and two insulins she can't remember the names but knows one is 10 units that she takes once a day and the other is 40 units that she also takes once a day. Daughter describes her mother as taking the medicines that she feels she needs to take. Patient also reports that she really does not take her blood sugars at home because it makes her fingers sore. I discussed how it is still important for her to check her glucose even if it is once a day. I discussed with her that if she could check her glucose once a day to rotate the time she takes it everyday (first day before breakfast, second day before lunch, etc). Inquired about knowledge about A1C and patient reports that she does not know what an A1C is. Discussed A1C results are pending and the last know A1c was 14% in 2011. We discussed what an A1C is, basic pathophysiology of DM Type 2, basic home care, and maintaining good CBG control to prevent long-term and short-term complications. 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 Daughter also described how the patients PCP took the patient to a clinic that had patients with amputations to try to get her to comply with DM control. Daughter reports no behavioral changes in the patient. Daughter mentions that the patient and her husband both have DM and both take medications as they feel like it. Discussed impact of nutrition, exercise, and medications on diabetes control.  Spoke with patient and daughter about ordering the Living Well with Diabetes book for information. Discussed carbohydrates, carbohydrate goals per day and meal,  along with portion sizes. The daughter is to get the home insulin names, doses, and frequency and report them to the RN to document in EPIC. Patient and daughter verbalized understanding of information discussed and she states that she has no further questions at this time related to diabetes.   Thanks, Christena DeemShannon Maryclaire Stoecker, RN, MSN, [facility] Diabetes Coordinator Inpatient Diabetes Program [phone] (Team Pager)

## 2014-10-25 NOTE — Progress Notes (Signed)
UR completed 

## 2014-10-25 NOTE — Care Management Note (Addendum)
CARE MANAGEMENT NOTE 10/25/2014  Patient:  [name],Jameca I   Account Number:  [id number]  Date Initiated:  10/25/2014  Documentation initiated by:  Danny Yackley  Subjective/Objective Assessment:   CM following for progression and d/c planning.     Action/Plan:   Noted request for amt insurance would pay for insulin, however per hospital this pt has no insurance. Will assist with MATCH program if available. MD notified.   Anticipated DC Date:     Anticipated DC Plan:  HOME/SELF CARE         Choice offered to / List presented to:             Status of service:   Medicare Important Message given?   (If response is "NO", the following Medicare IM given date fields will be blank) Date Medicare IM given:   Medicare IM given by:   Date Additional Medicare IM given:   Additional Medicare IM given by:    Discharge Disposition:    Per UR Regulation:    If discussed at Long Length of Stay Meetings, dates discussed:    Comments:

## 2014-10-25 NOTE — ED Notes (Signed)
Hospitalist at the bedside 

## 2014-10-25 NOTE — Progress Notes (Addendum)
PROGRESS NOTE    Rebecca [name]:[id number] DOB: October 17, 1965 DOA: 10/24/2014 PCP: Pcp Not In System  HPI/Brief narrative 49 year old Spanish-speaking female patient with 18 year history of DM 2, takes oral medications, Levemir 40 units daily at bedtime and NovoLog 10 units twice a day, claims compliance with medications but not with diet, CBG checks at home in the 400s, 2 week history of blurred vision and intermittent dizziness, presented to the ED where found to have acute kidney injury and uncontrolled DM 2. Recently treated in the ED for abrasions of left posterior thigh with antibiotics.   Assessment/Plan:  1. Uncontrolled DM 2: Not sure if she is really compliant with medications. Not compliant with diet. Counseled extensively regarding compliance with both. Consulted diabetes coordinator and dietitian. Missed 10/24/14 dose of Levemir. Continue current dose of Levemir, NovoLog moderate SSI and add mealtime NovoLog 6 units 3 times a day. Check A1c. These medications will need to be titrated. Continue Glucotrol. Metformin held. 2. Mild dehydration: Possibly from hyperglycemia. Resolved after IV fluid hydration. 3. Acute kidney injury: Secondary to dehydration, HCTZ and ACEI. Culprit medications temporarily held. Hydrated with IV fluids. Resolved. 4. Left posterior thigh aberration: No acute findings. Healing. 5. Anemia: Seems chronic and stable. Follow CBCs.   Code Status: Full Family Communication: None at bedside Disposition Plan: Home possibly 2/5   Consultants:  None  Procedures:  None  Antibiotics:  Neosporin   Subjective: Feels better. Interviewed via telephone interpreter. Denies urinary frequency or thirst. No further dizziness.  Objective: Filed Vitals:   10/25/14 0154 10/25/14 0220 10/25/14 0627 10/25/14 0900  BP:  136/69 152/73 149/82  Pulse:  93 99 92  Temp: 98.1 F (36.7 C) 98.2 F (36.8 C) 98.5 F (36.9 C) 98.4 F (36.9 C)  TempSrc: Oral Oral Oral  Oral  Resp:  Height:   (1.626 m)    Weight:  73.3 kg (161 lb 9.6 oz)    SpO2:  100% 100% 100%   No intake or output data in the 24 hours ending 10/25/14 1310 Filed Weights   10/25/14 0220  Weight: 73.3 kg (161 lb 9.6 oz)     Exam: Patient examined with female nurse chaperone in room. General exam: Pleasant young female sitting comfortably on bed. Respiratory system: Clear. No increased work of breathing. Cardiovascular system: S1 & S2 heard, RRR. No JVD, murmurs, gallops, clicks or pedal edema. Gastrointestinal system: Abdomen is nondistended, soft and nontender. Normal bowel sounds heard. Central nervous system: Alert and oriented. No focal neurological deficits. Extremities: Symmetric 5 x 5 power. Healing mild abrasion over mid posterior left thigh. No acute findings.   Data Reviewed: Basic Metabolic Panel:  Recent Labs Lab 10/24/14 2223 10/25/14 0616  NA 134* 136  K 4.1 4.7  CL 101 106  CO2 24 22  GLUCOSE 215* 362*  BUN 47* 37*  CREATININE 1.53* 1.08  CALCIUM 9.3 8.5   Liver Function Tests: No results for input(s): AST, ALT, ALKPHOS, BILITOT, PROT, ALBUMIN in the last 168 hours. No results for input(s): LIPASE, AMYLASE in the last 168 hours. No results for input(s): AMMONIA in the last 168 hours. CBC:  Recent Labs Lab 10/24/14 2223 10/25/14 0616  WBC 7.2 8.3  NEUTROABS 3.9  --   HGB 11.2* 10.5*  HCT 31.7* 30.2*  MCV 78.1 79.1  PLT 378 346   Cardiac Enzymes: No results for input(s): CKTOTAL, CKMB, CKMBINDEX, TROPONINI in the last 168 hours. BNP (last 3 results)  No results for input(s): PROBNP in the last 8760 hours. CBG:  Recent Labs Lab 10/24/14 2217 10/25/14 0223 10/25/14 0827 10/25/14 1139  GLUCAP 246* 325* 408* 375*    No results found for this or any previous visit (from the past 240 hour(s)).         Studies: No results found.      Scheduled Meds: . enoxaparin (LOVENOX) injection  30 mg Subcutaneous Q24H  .  ferrous sulfate  325 mg Oral TID WC  . gabapentin  600 mg Oral TID  . glipiZIDE  5 mg Oral BID AC  . [START ON 10/26/2014] Influenza vac split quadrivalent PF  0.5 mL Intramuscular Tomorrow-1000  . insulin aspart  0-15 Units Subcutaneous TID WC  . insulin aspart  0-5 Units Subcutaneous QHS  . insulin aspart  4 Units Subcutaneous TID WC  . insulin detemir  40 Units Subcutaneous Daily  . living well with diabetes book   Does not apply Once  . living well with diabetes book- in spanish   Does not apply Once  . neomycin-bacitracin-polymyxin   Topical BID  . [START ON 10/26/2014] pneumococcal 23 valent vaccine  0.5 mL Intramuscular Tomorrow-1000   Continuous Infusions:   Active Problems:   Acute kidney injury   Hyperglycemia   Abrasion of thigh, left   DM (diabetes mellitus), type 2, uncontrolled    Time spent: 30 minutes.    Marcellus ScottHONGALGI,Sherral Dirocco, MD, FACP, FHM. [facility] Pager [phone]  If 7PM-7AM, please contact night-coverage [email or web address] Password [email or web address] 10/25/2014, 1:10 PM    LOS: 1 day

## 2014-10-25 NOTE — Progress Notes (Signed)
INITIAL NUTRITION ASSESSMENT  DOCUMENTATION CODES Per approved criteria  -Not Applicable   INTERVENTION: Provide Glucerna Shake po once daily, each supplement provides 220 kcal and 10 grams of protein.  Diabetes diet education materials given.  Encourage adequate PO intake.  NUTRITION DIAGNOSIS: Increased nutrient needs related to acute kidney injury as evidenced by estimated nutrition needs.   Goal: Pt to meet >/= 90% of their estimated nutrition needs   Monitor:  PO intake, weight trends, labs, I/O's  Reason for Assessment: MD consult for assessment of nutrition requirements/status  49 y.o. female  Admitting Dx: AKI  ASSESSMENT: Pt with a history of uncontrolled DM2 who presents to the ED with complaints of blood sugars at home of 400s.She was evaluated in the ED and was found to have an elevated BUN/Cr.  Daughter was present at bedside. Pt mainly speaks spanish. Pt reports having a good appetite currently and PTA at home eating at least 2 meals a day along with a snack at night. Weight has been stable. Pt and daughter report they have previously received a diabetes diet education and is knowledgeable about it, however pt reports she does not follow it. Pt and daughter were educated on the importance of consistent carbohydrate consumption at meals and to eat every 4-5 hours. Also discussed the importance of portion sizing and adequate protein intake. Pt is agreeable to Glucerna Shake as po intake this AM was inadequate due to her not liking the food served. RD to order. Pt was additionally given "Plate Method Menu Ideas" handout.   Diet recall: Breakfast: Coffee, eggs, tortillas Lunch: Chicken, beans, and vegetables Snack: Orange or potato chips  Pt with no observed significant fat or muscle mass loss.  Labs:Low GFR. CBG at 11:39am - 375 ml/dL.  High BUN.  Height: Ht Readings from Last 1 Encounters:  10/25/14  (1.626 m)    Weight: Wt Readings from Last 1  Encounters:  10/25/14 161 lb 9.6 oz (73.3 kg)    Ideal Body Weight: 120 lbs  % Ideal Body Weight: 134%  Wt Readings from Last 10 Encounters:  10/25/14 161 lb 9.6 oz (73.3 kg)  03/03/13 132 lb (59.875 kg)  05/21/12 132 lb 1.6 oz (59.92 kg)  04/17/12 130 lb (58.968 kg)    Usual Body Weight: 161 lbs  % Usual Body Weight: 100%  BMI:  Body mass index is 27.72 kg/(m^2).  Estimated Nutritional Needs: Kcal: 1900-2100 Protein: 90-100 grams Fluid: 1.9 - 2.1 L/day  Skin: intact  Diet Order: Diet Carb Modified  EDUCATION NEEDS: -Education needs addressed  No intake or output data in the 24 hours ending 10/25/14 1035  Last BM: 2/2  Labs:   Recent Labs Lab 10/24/14 2223 10/25/14 0616  NA 134* 136  K 4.1 4.7  CL 101 106  CO2 24 22  BUN 47* 37*  CREATININE 1.53* 1.08  CALCIUM 9.3 8.5  GLUCOSE 215* 362*    CBG (last 3)   Recent Labs  10/24/14 2217 10/25/14 0223 10/25/14 0827  GLUCAP 246* 325* 408*    Scheduled Meds: . enoxaparin (LOVENOX) injection  30 mg Subcutaneous Q24H  . ferrous sulfate  325 mg Oral TID WC  . gabapentin  600 mg Oral TID  . glipiZIDE  5 mg Oral BID AC  . [START ON 10/26/2014] Influenza vac split quadrivalent PF  0.5 mL Intramuscular Tomorrow-1000  . insulin aspart  0-15 Units Subcutaneous TID WC  . insulin aspart  0-5 Units Subcutaneous QHS  . insulin aspart  4 Units Subcutaneous TID WC  . insulin detemir  40 Units Subcutaneous Daily  . neomycin-bacitracin-polymyxin   Topical BID  . [START ON 10/26/2014] pneumococcal 23 valent vaccine  0.5 mL Intramuscular Tomorrow-1000    Continuous Infusions:   Past Medical History  Diagnosis Date  . Diabetes mellitus     History reviewed. No pertinent past surgical history.  Marijean NiemannStephanie La, MS, RD, LDN Pager # [phone] After hours/ weekend pager # [phone]

## 2014-10-25 NOTE — H&P (Signed)
[facility] Admission History and Physical       Rebecca [name] EXB:[id number] DOB: March 28, 1966 DOA: 10/24/2014  Referring physician: EDP PCP: Pcp Not In System [facility]:   Chief Complaint:  Elevated Blood Sugars  HPI: Rebecca [name] is a 49 y.o. female with a history of uncontrolled DM2 who presents to the ED with complaints of blood sugars at home of 400s.   She denies any fever or chills or nausea or vomiting.  She reports taking her prescribed medicines.  She had been seen on 01/29 and prescribed keflex for an infected abrasion on her left posterior thigh area.    She was evaluated in the ED and was found to have an elevated BUN/Cr and was referred for medical admission.   Family at bedside assisting with the Interview.      Review of Systems:  Constitutional: No Weight Loss, No Weight Gain, Night Sweats, Fevers, Chills, Dizziness, Fatigue, or Generalized Weakness HEENT: No Headaches, Difficulty Swallowing,Tooth/Dental Problems,Sore Throat,  No Sneezing, Rhinitis, Ear Ache, Nasal Congestion, or Post Nasal Drip,  Cardio-vascular:  No Chest pain, Orthopnea, PND, Edema in Lower Extremities, Anasarca, Dizziness, Palpitations  Resp: No Dyspnea, No DOE, No Productive Cough, No Non-Productive Cough, No Hemoptysis, No Wheezing.    GI: No Heartburn, Indigestion, Abdominal Pain, Nausea, Vomiting, Diarrhea, Hematemesis, Hematochezia, Melena, Change in Bowel Habits,  Loss of Appetite  GU: No Dysuria, Change in Color of Urine, No Urgency or Frequency, No Flank pain.  Musculoskeletal: No Joint Pain or Swelling, No Decreased Range of Motion, No Back Pain.  Neurologic: No Syncope, No Seizures, Muscle Weakness, Paresthesia, Vision Disturbance or Loss, No Diplopia, No Vertigo, No Difficulty Walking,  Skin: No Rash or Lesions. Psych: No Change in Mood or Affect, No Depression or Anxiety, No Memory loss, No Confusion, or Hallucinations   Past Medical History    Diagnosis Date  . Diabetes mellitus       History reviewed. No pertinent past surgical history.     Prior to Admission medications   Medication Sig Start Date End Date Taking? Authorizing Provider  ferrous sulfate 325 (65 FE) MG tablet Take 325 mg by mouth 3 (three) times daily with meals.    Yes Historical Provider, MD  gabapentin (NEURONTIN) 600 MG tablet Take 600 mg by mouth 3 (three) times daily.   Yes Historical Provider, MD  glipiZIDE (GLUCOTROL) 5 MG tablet Take 5 mg by mouth 2 (two) times daily before a meal.   Yes Historical Provider, MD  insulin aspart (NOVOLOG) 100 UNIT/ML injection Inject 10 Units into the skin daily.   Yes Historical Provider, MD  insulin detemir (LEVEMIR) 100 UNIT/ML injection Inject 40 Units into the skin daily.   Yes Historical Provider, MD  lisinopril-hydrochlorothiazide (PRINZIDE,ZESTORETIC) 20-12.5 MG per tablet Take 1 tablet by mouth daily.   Yes Historical Provider, MD  metFORMIN (GLUMETZA) 1000 MG (MOD) 24 hr tablet Take 1,000 mg by mouth 2 (two) times daily with a meal.   Yes Historical Provider, MD  cephALEXin (KEFLEX) 500 MG capsule Take 1 capsule (500 mg total) by mouth 4 (four) times daily. Patient not taking: Reported on 10/25/2014 10/19/14   Roxy Horseman, PA-C  insulin glargine (LANTUS) 100 UNIT/ML injection Inject 0.2 mLs (20 Units total) into the skin at bedtime. Patient not taking: Reported on 10/25/2014 03/03/13   Fayrene Helper, PA-C  insulin lispro protamine-lispro (HUMALOG 75/25) (75-25) 100 UNIT/ML SUSP Inject 10 Units into the skin 3 (three) times daily with  meals. Patient not taking: Reported on 10/25/2014 03/03/13   Fayrene Helper, PA-C  naproxen sodium (ANAPROX) 550 MG tablet Take 550 mg by mouth 2 (two) times daily as needed. For pain    Historical Provider, MD      Allergies  Allergen Reactions  . No Known Allergies      Social History:  reports that she has never smoked. She does not have any smokeless tobacco history on file. She  reports that she does not drink alcohol or use illicit drugs.     Family History  Problem Relation Age of Onset  . Hyperlipidemia Father   . Hypertension Father   . Hyperlipidemia Father   . Hyperlipidemia Father   . Hyperlipidemia Father        Physical Exam:  GEN: Pleasant Obese  49 y.o. Hispanic female examined  and in no acute distress; cooperative with exam Filed Vitals:   10/25/14 0045 10/25/14 0100 10/25/14 0115 10/25/14 0130  BP: 134/73 143/69 152/74 147/69  Pulse: 93 95 96 98  Temp:      Resp: SpO2: 100% 100% 100% 100%   Blood pressure 147/69, pulse 98, temperature 98.1 F (36.7 C), resp. rate 17, SpO2 100 %. PSYCH: She is alert and oriented x4; does not appear anxious does not appear depressed; affect is normal HEENT: Normocephalic and Atraumatic, Mucous membranes pink; PERRLA; EOM intact; Fundi:  Benign;  No scleral icterus, Nares: Patent, Oropharynx: Clear, Fair Dentition,    Neck:  FROM, No Cervical Lymphadenopathy nor Thyromegaly or Carotid Bruit; No JVD; Breasts:: Not examined CHEST WALL: No tenderness CHEST: Normal respiration, clear to auscultation bilaterally HEART: Regular rate and rhythm; no murmurs rubs or gallops BACK: No kyphosis or scoliosis; No CVA tenderness ABDOMEN: Positive Bowel Sounds,  Obese, Soft Non-Tender; No Masses, No Organomegaly, No Pannus; No Intertriginous candida. Rectal Exam: Not done EXTREMITIES: No Cyanosis, Clubbing, or Edema; +Abrasion on the Left Posterior Mid Thigh Area without Exudation,   Genitalia: not examined PULSES: 2+ and symmetric SKIN: Normal hydration no rash or ulceration CNS:  Alert and Oriented x 4, No Focal Deficits Vascular: pulses palpable throughout    Labs on Admission:  Basic Metabolic Panel:  Recent Labs Lab 10/24/14 2223  NA 134*  K 4.1  CL 101  CO2 24  GLUCOSE 215*  BUN 47*  CREATININE 1.53*  CALCIUM 9.3   Liver Function Tests: No results for input(s): AST, ALT, ALKPHOS,  BILITOT, PROT, ALBUMIN in the last 168 hours. No results for input(s): LIPASE, AMYLASE in the last 168 hours. No results for input(s): AMMONIA in the last 168 hours. CBC:  Recent Labs Lab 10/24/14 2223  WBC 7.2  NEUTROABS 3.9  HGB 11.2*  HCT 31.7*  MCV 78.1  PLT 378   Cardiac Enzymes: No results for input(s): CKTOTAL, CKMB, CKMBINDEX, TROPONINI in the last 168 hours.  BNP (last 3 results) No results for input(s): BNP in the last 8760 hours.  ProBNP (last 3 results) No results for input(s): PROBNP in the last 8760 hours.  CBG:  Recent Labs Lab 10/24/14 2217  GLUCAP 246*    Radiological Exams on Admission: No results found.     Assessment/Plan:   49 y.o. female with  Active Problems:    1.    AKI (acute kidney injury)   IVFs   Hold Lisinopril/HCTZ Rx   Monitor BUN/Cr       2.    Uncontrolled DM2   Continue Levemir as Glucose  tolerates   Continue Meal Coverage Insulin   Hold  Metformin   Continue Glipizide   Diabetic Diet   SSI coverage PRN   Check HbA1C in AM         3.    Abrasion of Left Thigh   Neosporin Ointment BID to Abrasion      4.    DVT prophylaxis    Lovenox    Code Status:    FULL CODE Family Communication:   Family at Bedside   Disposition Plan:         Time spent:  6360 Minutes  Ron ParkerJENKINS,Danil Wedge C [facility] Pager [phone]   If 7AM -7PM Please Contact the Day Rounding Team MD for [facility]  If 7PM-7AM, Please Contact Night-Floor Coverage  [email or web address] Password [email or web address] 10/25/2014, 1:42 AM

## 2014-10-26 DIAGNOSIS — I10 Essential (primary) hypertension: Secondary | ICD-10-CM

## 2014-10-26 LAB — BASIC METABOLIC PANEL
Anion gap: 5 (ref 5–15)
BUN: 30 mg/dL — AB (ref 6–23)
CALCIUM: 9.1 mg/dL (ref 8.4–10.5)
CHLORIDE: 108 mmol/L (ref 96–112)
CO2: 24 mmol/L (ref 19–32)
CREATININE: 0.88 mg/dL (ref 0.50–1.10)
GFR, EST AFRICAN AMERICAN: 89 mL/min — AB (ref >90)
GFR, EST NON AFRICAN AMERICAN: 76 mL/min — AB (ref >90)
Glucose, Bld: 209 mg/dL — ABNORMAL HIGH (ref 70–99)
POTASSIUM: 4.4 mmol/L (ref 3.5–5.1)
Sodium: 137 mmol/L (ref 135–145)

## 2014-10-26 LAB — CBC
HCT: 30.9 % — ABNORMAL LOW (ref 36.0–46.0)
HEMOGLOBIN: 10.7 g/dL — AB (ref 12.0–15.0)
MCH: 27.7 pg (ref 26.0–34.0)
MCHC: 34.6 g/dL (ref 30.0–36.0)
MCV: 80.1 fL (ref 78.0–100.0)
Platelets: 341 10*3/uL (ref 150–400)
RBC: 3.86 MIL/uL — ABNORMAL LOW (ref 3.87–5.11)
RDW: 14.4 % (ref 11.5–15.5)
WBC: 6.4 10*3/uL (ref 4.0–10.5)

## 2014-10-26 LAB — GLUCOSE, CAPILLARY
GLUCOSE-CAPILLARY: 219 mg/dL — AB (ref 70–99)
Glucose-Capillary: 194 mg/dL — ABNORMAL HIGH (ref 70–99)

## 2014-10-26 LAB — HEMOGLOBIN A1C
Hgb A1c MFr Bld: 13.5 % — ABNORMAL HIGH (ref 4.8–5.6)
MEAN PLASMA GLUCOSE: 341 mg/dL

## 2014-10-26 MED ORDER — INSULIN DETEMIR 100 UNIT/ML ~~LOC~~ SOLN: 42.0000 [IU] | Freq: Every day | SUBCUTANEOUS | Status: DC

## 2014-10-26 MED ORDER — INSULIN ASPART 100 UNIT/ML ~~LOC~~ SOLN: 6.0000 [IU] | Freq: Three times a day (TID) | SUBCUTANEOUS | Status: DC

## 2014-10-26 MED ORDER — GLUCERNA SHAKE PO LIQD: 237.0000 mL | Freq: Every day | ORAL | Status: DC

## 2014-10-26 MED ORDER — BACITRACIN-NEOMYCIN-POLYMYXIN OINTMENT TUBE: 1.0000 "application " | TOPICAL_OINTMENT | Freq: Two times a day (BID) | CUTANEOUS | Status: DC

## 2014-10-26 NOTE — Progress Notes (Signed)
Inpatient Diabetes Program Recommendations  AACE/ADA: New Consensus Statement on Inpatient Glycemic Control (2013)  Target Ranges:  Prepandial:   less than 140 mg/dL      Peak postprandial:   less than 180 mg/dL (1-2 hours)      Critically ill patients:  140 - 180 mg/dL   Results for Rebecca [name], Rebecca [name] (MRN [id number]) as of 10/26/2014 08:07  Ref. Range 10/25/2014 08:27 10/25/2014 11:39 10/25/2014 17:12 10/25/2014 21:51 10/26/2014 07:43  Glucose-Capillary Latest Range: 70-99 mg/dL 409408 (H) 811375 (H) 914186 (H) 106 (H) 219 (H)   Current orders for Inpatient glycemic control: Levemir 40 daily, Novolog 0-15 units TID with meals, Novolog 0-5 units QHS, Novolog 6 units TID with meals for meal coverage, Glipizide 5 mg BID  Inpatient Diabetes Program Recommendations Insulin - Basal: Please consider increasing Levemir to 42 units daily.  Thanks, Orlando PennerMarie Eira Alpert, RN, MSN, CCRN, CDE Diabetes Coordinator Inpatient Diabetes Program [phone] (Team Pager) [phone] ([facility] office) [phone] [facility] office)

## 2014-10-26 NOTE — Care Management Note (Signed)
CARE MANAGEMENT NOTE 10/26/2014  Patient:  Rebecca [name],Rebecca [name]   Account Number:  [id number]  Date Initiated:  10/25/2014  Documentation initiated by:  Marylu Dudenhoeffer  Subjective/Objective Assessment:   CM following for progression and d/c planning.     Action/Plan:   Noted request for amt insurance would pay for insulin, however per hospital this pt has no insurance. [facility] MD notified.   Anticipated DC Date:  10/26/2014   Anticipated DC Plan:  HOME/SELF CARE      DC Planning Services  MATCH Program  Follow-up appt scheduled      Choice offered to / List presented to:             Status of service:   Medicare Important Message given?  NO (If response is "NO", the following Medicare IM given date fields will be blank) Date Medicare IM given:   Medicare IM given by:   Date Additional Medicare IM given:   Additional Medicare IM given by:    Discharge Disposition:  HOME/SELF CARE  Per UR Regulation:    If discussed at Long Length of Stay Meetings, dates discussed:    Comments:  10/26/2014 [facility] [place] Oct [place], 2014 @ 2016. Johny Shockheryl Brighten Buzzelli RN MPH case manager, [phone]

## 2014-10-26 NOTE — Discharge Summary (Addendum)
Physician Discharge Summary  Rebecca [name] ZOX:[id number] DOB: Oct 17, 1965 DOA: 10/24/2014  PCP: Pcp Not In System  Admit date: 10/24/2014 Discharge date: 10/26/2014  Time spent: Greater than 30 minutes  Recommendations for Outpatient Follow-up:  1. [facility], in 1 week with repeat labs (CBC & BMP).  Discharge Diagnoses:  Active Problems:   Acute kidney injury   Hyperglycemia   Abrasion of thigh, left   DM (diabetes mellitus), type 2, uncontrolled   Discharge Condition: Improved & Stable  Diet recommendation: Heart Healthy & Diabetic diet.  Filed Weights   10/25/14 0220 10/26/14 0547  Weight: 73.3 kg (161 lb 9.6 oz) 72.3 kg (159 lb 6.3 oz)    History of present illness:  49 year old Spanish-speaking female patient with 18 year history of DM 2, takes oral medications, Levemir 40 units daily at bedtime and NovoLog 10 units twice a day, claims compliance with medications but not with diet, CBG checks at home in the 400s, 2 week history of blurred vision and intermittent dizziness, presented to the ED where found to have acute kidney injury and uncontrolled DM 2. Recently treated in the ED for abrasions of left posterior thigh with antibiotics.  Hospital Course:    Uncontrolled DM 2: Not sure if she is really compliant with medications. Not compliant with diet. Counseled extensively regarding compliance with both. Consulted diabetes coordinator and dietitian - input appreciated. Missed 10/24/14 dose of Levemir. Started back home dose Levemir, NovoLog moderate SSI and added mealtime NovoLog 6 units 3 times a day. A1c: 13.5. These medications will need to be titrated as OP. Continue Glucotrol & Metformin. CBG's better since 2/4 afternoon- see below. Increase Levemir to 42 units daily. DC on mealtime Novolog as above. Will not DC on SSI- likely non compliance. CM will assist with meds and PCP follow up.  Mild dehydration: Possibly from hyperglycemia. Resolved after  IV fluid hydration.  Acute kidney injury: Secondary to dehydration, HCTZ and ACEI. Culprit medications temporarily held. Hydrated with IV fluids. Resolved. Resume HCTZ/ACEI at DC. Follow up BMP in a week.  Left posterior thigh aberration: No acute findings. Almost healed.  Anemia: Seems chronic and stable. OP follow up.  HTN: fluctuating/controlled. Resume meds at DC.  Consultants:  None  Procedures:  None   Discharge Exam:  Complaints: Interviewed thru [employer]line [employer]. Denied complains.  Filed Vitals:   10/25/14 1835 10/25/14 2157 10/26/14 0020 10/26/14 0547  BP: 155/66 115/66 108/61 126/76  Pulse: 81 94 83 91  Temp: 98.4 F (36.9 C) 98.1 F (36.7 C) 97.7 F (36.5 C) 98 F (36.7 C)  TempSrc: Oral Oral Oral Oral  Resp: 18     Height:      Weight:    72.3 kg (159 lb 6.3 oz)  SpO2: 100% 100% 100% 100%    General exam: Pleasant young female sitting comfortably in chair. Respiratory system: Clear. No increased work of breathing. Cardiovascular system: S1 & S2 heard, RRR. No JVD, murmurs, gallops, clicks or pedal edema. Gastrointestinal system: Abdomen is nondistended, soft and nontender. Normal bowel sounds heard. Central nervous system: Alert and oriented. No focal neurological deficits. Extremities: Symmetric 5 x 5 power. Healing mild abrasion over mid posterior left thigh- not examined today.  Discharge Instructions      Discharge Instructions    Activity as tolerated - No restrictions    Complete by:  As directed      Call MD for:  extreme fatigue    Complete by:  As directed      Call MD for:  persistant dizziness or light-headedness    Complete by:  As directed      Call MD for:    Complete by:  As directed   High blood sugars.     Diet - low sodium heart healthy    Complete by:  As directed      Diet Carb Modified    Complete by:  As directed             Medication List    STOP taking these medications        naproxen sodium 550 MG tablet   Commonly known as:  ANAPROX      TAKE these medications        feeding supplement (GLUCERNA SHAKE) Liqd  Take 237 mLs by mouth daily at 3 pm.     ferrous sulfate 325 (65 FE) MG tablet  Take 325 mg by mouth 3 (three) times daily with meals.     gabapentin 600 MG tablet  Commonly known as:  NEURONTIN  Take 600 mg by mouth 3 (three) times daily.     glipiZIDE 5 MG tablet  Commonly known as:  GLUCOTROL  Take 5 mg by mouth 2 (two) times daily before a meal.     insulin aspart 100 UNIT/ML injection  Commonly known as:  novoLOG  Inject 6 Units into the skin 3 (three) times daily with meals.     insulin detemir 100 UNIT/ML injection  Commonly known as:  LEVEMIR  Inject 0.42 mLs (42 Units total) into the skin daily.     lisinopril-hydrochlorothiazide 20-12.5 MG per tablet  Commonly known as:  PRINZIDE,ZESTORETIC  Take 1 tablet by mouth daily.     metFORMIN 1000 MG (MOD) 24 hr tablet  Commonly known as:  GLUMETZA  Take 1,000 mg by mouth 2 (two) times daily with a meal.     neomycin-bacitracin-polymyxin Oint  Commonly known as:  NEOSPORIN  Apply 1 application topically 2 (two) times daily. Apply to  Left Posterior Thigh Abrasion       Follow-up Information    [facility] Schedule an appointment as soon as possible for a visit in 1 week.   Why:  To be seen with repeat labs (CBC & BMP). Will need medication assistance and Diabetes management.       The results of significant diagnostics from this hospitalization (including imaging, microbiology, ancillary and laboratory) are listed below for reference.    Significant Diagnostic Studies: No results found.  Microbiology: No results found for this or any previous visit (from the past 240 hour(s)).   Labs: Basic Metabolic Panel:  Recent Labs Lab 10/24/14 2223 10/25/14 0616 10/26/14 0600  NA 134* 136 137  K 4.1 4.7 4.4  CL 101 106 108  CO2 24 22 24   GLUCOSE 215* 362* 209*   BUN 47* 37* 30*  CREATININE 1.53* 1.08 0.88  CALCIUM 9.3 8.5 9.1   Liver Function Tests: No results for input(s): AST, ALT, ALKPHOS, BILITOT, PROT, ALBUMIN in the last 168 hours. No results for input(s): LIPASE, AMYLASE in the last 168 hours. No results for input(s): AMMONIA in the last 168 hours. CBC:  Recent Labs Lab 10/24/14 2223 10/25/14 0616 10/26/14 0600  WBC 7.2 8.3 6.4  NEUTROABS 3.9  --   --   HGB 11.2* 10.5* 10.7*  HCT 31.7* 30.2* 30.9*  MCV 78.1 79.1 80.1  PLT 378  346 341   Cardiac Enzymes: No results for input(s): CKTOTAL, CKMB, CKMBINDEX, TROPONINI in the last 168 hours. BNP: BNP (last 3 results) No results for input(s): BNP in the last 8760 hours.  ProBNP (last 3 results) No results for input(s): PROBNP in the last 8760 hours.  CBG:  Recent Labs Lab 10/25/14 0827 10/25/14 1139 10/25/14 1712 10/25/14 2151 10/26/14 0743  GLUCAP 408* 375* 186* 106* 219*        Signed:  Marcellus Scott, MD, FACP, FHM. [facility] Pager [phone]  If 7PM-7AM, please contact night-coverage [email or web address] Password [facility] 10/26/2014, 9:31 AM

## 2014-10-26 NOTE — Discharge Instructions (Signed)
Diabetes mellitus tipo2 (Type 2 Diabetes Mellitus) La diabetes mellitus tipo2, generalmente denominada diabetes tipo2, es una enfermedad prolongada (crnica). En la diabetes tipo2, el pncreas no produce suficiente insulina (una hormona), las clulas son menos sensibles a la insulina que se produce (resistencia a la insulina), o ambos. Normalmente, la Johnson Controlsinsulina mueve los azcares de los alimentos a las clulas de los tejidos. Las clulas de los tejidos Cendant Corporationutilizan los azcares para Psychiatristobtener energa. La falta de insulina o la falta de una respuesta normal a la insulina hace que el exceso de azcar se acumule en la sangre en lugar de Customer service managerpenetrar en las clulas de los tejidos. Como resultado, se producen niveles altos de Bankerazcar en la sangre (hiperglucemia). El [address]fect[address] de los niveles altos de azcar (glucosa) puede causar muchas complicaciones.  La diabetes tipo2 antes tambin se denominaba diabetes del Buckhead Ridgeadulto, pero puede ocurrir a Actuarycualquier edad.  FACTORES DE RIESGO  Una persona tiene mayor predisposicin a desarrollar diabetes tipo 2 si alguien en su familia tiene la enfermedad y tambin tiene uno o ms de los siguientes factores de riesgo principales:  Sobrepeso.  Estilo de vida sedentario.  Una historia de consumo constante de alimentos de altas caloras. Mantener un peso saludable y realizar actividad fsica regular puede reducir la probabilidad de desarrollar diabetes tipo 2. SNTOMAS  Una persona con diabetes tipo 2 no presenta sntomas en un principio. Los sntomas de la diabetes tipo 2 aparecen lentamente. Los sntomas son:  Aumento de la sed (polidipsia).  Aumento de la miccin (poliuria).  Orina con ms frecuencia durante la noche (nocturia).  Prdida de peso. La prdida de peso puede ser muy rpida.  Infecciones frecuentes y recurrentes.  Cansancio (fatiga).  Debilidad.  Cambios en la visin, como visin borrosa.  Olor a Water quality scientistfruta en el aliento.  Dolor abdominal.  Nuseas o  vmitos.  Cortes o moretones que tardan en sanarse.  Hormigueo o adormecimiento de las manos y los pies. DIAGNSTICO Con frecuencia la diabetes tipo 2 no se diagnostica hasta que se presentan las complicaciones de la diabetes. La diabetes tipo 2 se diagnostica cuando los sntomas o las complicaciones se presentan y cuando aumentan los niveles de glucosa en la [place]umb[place]angre. El nivel de glucosa en la sangre puede controlarse en uno o ms de los siguientes anlisis de sangre:  Medicin de glucosa en la sangre en [place] Parkayunas. No se le permitir comer durante al menos 8 horas antes de que se tome Colombiauna muestra de [place] Harborsangre.  Pruebas al azar de glucosa en la sangre. El nivel de glucosa en la sangre se controla en cualquier momento del da sin importar el momento en que haya comido.  Prueba de A1c (hemoglobina glucosilada) Una prueba de A1c proporciona informacin sobre el control de la glucosa en la sangre durante los ltimos 3 meses.  Prueba de tolerancia a la glucosa oral (PTGO). La glucosa en la sangre se mide despus de no haber comido (ayunas) durante dos horas y despus de beber una bebida que contenga glucosa. TRATAMIENTO   Usted puede necesitar administrarse insulina o medicamentos para la diabetes todos los das para State Street Corporationmantener los niveles de glucosa en la sangre en el rango deseado.  Si [place]a insulina, tal vez necesite ajustar la dosis segn los carbohidratos que haya consumido en cada comida o colacin. El objetivo del tratamiento es mantener el nivel de azcar en la sangre previo a comer (glucosa preprandial) entre 4270 y 130mg /dl. INSTRUCCIONES PARA EL CUIDADO EN EL HOGAR   Controle su nivel de  hemoglobina A1c dos veces al ao.  Contrlese a diario Air cabin crew de glucosa en la sangre segn las indicaciones de su mdico.  Supervise las cetonas en la orina cuando est enferma y segn las indicaciones de su mdico.  Tome el medicamento para la diabetes o adminstrese insulina segn las indicaciones de su  mdico para Radio producer nivel de glucosa en la sangre en el rango deseado.  Nunca se quede sin medicamento para la diabetes o sin insulina. Es necesario que la reciba [employer].  Si [place] insulina, tal vez deba ajustar la cantidad de insulina administrada segn los carbohidratos consumidos. Los hidratos de carbono pueden aumentar los niveles de glucosa en la sangre, pero deben incluirse en su dieta. Los hidratos de carbono aportan vitaminas, minerales y [place] que son [place] parte esencial de una dieta saludable. Los hidratos de carbono se encuentran en frutas, verduras, cereales integrales, productos lcteos, legumbres y alimentos que contienen azcares aadidos.  Consuma alimentos saludables. Programe una cita con un nutricionista certificado para que lo ayude a Chief Executive Officer de alimentacin adecuado para usted.  Baje de peso si es necesario.  Lleve una tarjeta de alerta mdica o use una pulsera o medalla de alerta mdica.  Lleve con usted una colacin de 15gramos de hidratos de carbono en todo momento para controlar los niveles bajos de glucosa en la sangre (hipoglucemia). Algunos ejemplos de colaciones de 15gramos de hidratos de carbono son los siguientes:  Tabletas de glucosa, 3 o 4.  Gel de glucosa, tubo de 15 gramos.  Pasas de uva, 2 cucharadas (24 gramos).  Caramelos de goma, 6.  Galletas de [place], 8.  Gaseosa comn, 4onzas ( ).  Pastillas de goma, 9.  Reconocer la hipoglucemia. La hipoglucemia se produce cuando los niveles de glucosa en la sangre son de 70 mg/dl o menos. El riesgo de hipoglucemia aumenta durante el ayuno o cuando se saltea las comidas, durante o despus de Education officer, environmental ejercicio intenso y [place] duerme. Los sntomas de hipoglucemia son:  Temblores o sacudidas.  Disminucin de la capacidad de concentracin.  Sudoracin.  Aumento de la frecuencia cardaca.  Dolor de [place].  Sequedad en la  boca.  Hambre.  Irritabilidad.  Ansiedad.  Sueo agitado.  Alteracin del habla o de la coordinacin.  Confusin.  Tratar la hipoglucemia rpidamente. Si usted est alerta y puede tragar con seguridad, siga la regla de 15/15 que consiste en:  [employer] 15 y 20gramos de glucosa de accin rpida o carbohidratos. Las opciones de accin rpida son un gel de glucosa, tabletas de glucosa, o 4 onzas (120 ml) de jugo de frutas, gaseosa comn, o leche baja en grasa.  Compruebe su nivel de glucosa en la sangre 15 minutos despus de tomar la glucosa.  Tome entre 15 y 20 gramos ms de glucosa si el nivel de glucosa en la sangre todava es de /dl o inferior.  Ingiera una comida o una colacin en el lapso de 1 hora una vez que los niveles de glucosa en la sangre vuelven a la normalidad.  Est atento a si siente mucha sed u orina con mayor frecuencia, porque son signos tempranos de hiperglucemia. El reconocimiento temprano de la hiperglucemia permite un tratamiento oportuno. Trate la hiperglucemia segn le indic su mdico.  Haga, al menos, de actividad fsica moderada a la semana, distribuidos en, por lo menos, 3das a la semana o como lo indique su mdico. Adems, debe realizar ejercicios de resistencia por lo menos 2veces a la [employer]  o como lo indique su mdico. Trate de no permanecer inactivo durante ms de seguidos.  Ajuste su medicamento y la ingesta de alimentos, segn sea necesario, si inicia un nuevo ejercicio o deporte.  Siga su plan para los [address] de enfermedad cuando no puede comer o beber como de [place].  No consuma ningn producto que contenga tabaco, como cigarrillos, tabaco de Theatre manager o Administrator, Civil Service. Si necesita ayuda para dejar de fumar, hable con el mdico.  Limite el consumo de alcohol a no ms de 1 medida por da en las mujeres no embarazadas y 2 medidas en los hombres. Debe beber alcohol solo mientras come. Hable con su mdico  acerca de si el alcohol es seguro para usted. Informe a su mdico si bebe alcohol varias veces a la semana.  Concurra a todas las visitas de control como se lo haya indicado el mdico. Esto es importante.  Programe un examen de la vista poco despus del diagnstico de diabetes tipo 2 y luego anualmente.  Realice diariamente el cuidado de la piel y de los pies. Examine su piel y los pies diariamente para ver si tiene cortes, moretones, enrojecimiento, problemas en las uas, sangrado, ampollas o Advertising account planner. Su mdico debe hacerle un examen de los pies una vez por ao.  Cepllese los dientes y encas por lo menos dos veces al da y use hilo dental al menos una vez por da. Concurra regularmente a las visitas de control con el dentista.  Comparta su plan de control de diabetes en el trabajo o en la escuela.  Mantngase al da con las vacunas. Se recomienda que las Smith International de [id number] con diabetes se apliquen la vacuna contra la neumona. En algunos casos, pueden administrarse dos inyecciones separadas. Pregntele al mdico si tiene la vacuna contra la neumona al da.  Aprenda a Dealer.  Obtenga la mayor cantidad posible de informacin sobre la diabetes y solicite ayuda siempre que sea necesario.  Busque programas de rehabilitacin y participe en ellos para mantener o mejorar su independencia y su calidad de vida. Solicite la derivacin a fisioterapia o terapia ocupacional si se le [employer] Health o la mano, o tiene problemas para asearse, vestirse, comer, o durante la [place] fsica. SOLICITE ATENCIN MDICA SI:   No puede comer alimentos o beber por ms de 6 horas.  Tuvo nuseas o ha vomitado durante ms de 6 horas.  Su nivel de glucosa en la sangre es mayor de 240 mg/dl.  Presenta algn cambio en el estado mental.  Desarrolla una enfermedad grave adicional.  Tuvo diarrea durante ms de 6 horas.  Ha estado enfermo o ha tenido fiebre durante un par [address] y no  mejora.  Siente dolor al practicar cualquier actividad fsica. SOLICITE ATENCIN MDICA DE INMEDIATO SI:  Tiene dificultad para respirar.  Tiene niveles de cetonas moderados a altos. ASEGRESE DE QUE:  Comprende estas instrucciones.  Controlar su afeccin.  Recibir ayuda de inmediato si no mejora o si empeora. Document Released: 09/07/2005 Document Revised: 01/22/2014 [facility] Patient Information 2015 [place], [place]. This information is not intended to replace advice given to you by your health care provider. Make sure you discuss any questions you have with your health care provider.  Control del nivel de glucosa en la sangre (Blood Glucose Monitoring) El control de la glucosa en la sangre (tambin llamada azcar en la sangre) lo ayudar a tener la diabetes bajo control. Tambin ayuda a que usted y el mdico controlen la diabetes  y determinen si el tratamiento es Kensetteficaz. POR QU HAY QUE CONTROLAR LA GLUCOSA EN LA SANGRE?  Esto puede ayudar a comprender de United Stationersqu manera los alimentos, la actividad fsica y los medicamentos inciden en los niveles de Eagle Bendglucosa.  Le permite conocer el nivel de glucosa en la sangre en cualquier momento dado. Puede saber rpidamente si el nivel es bajo (hipoglucemia) o alto (hiperglucemia).  Puede ser de ayuda para que usted y el mdico sepan cmo Presenter, broadcastingajustar los medicamentos,  y para entender cmo controlar una enfermedad o ajustar los medicamentos para hacer ejercicio. CUNDO DEBE HACERSE LAS PRUEBAS? El mdico lo ayudar a decidir con qu frecuencia deber AGCO Corporationcontrolar los niveles de glucosa en la [place]sangre. Esto puede depender del tipo de diabetes que tenga, su control de la diabetes o los tipos de medicamentos que tome. Asegrese de anotar todos los valores de la glucosa en la Gumlogsangre, de modo que esta informacin pueda ser revisada por su mdico. A continuacin puede ver ejemplos de los momentos para Education officer, environmentalrealizar la prueba que el mdico puede Neurosurgeonsugerir. Diabetes  tipo1  Barnes & NobleHaga la prueba 4 veces por da si est bien controlado, [place]ausa una bomba de Milaninsulina o se aplica muchas inyecciones diarias.  Si la diabetes no est bien controlada o si est enfermo, puede ser necesario que se controle con ms frecuencia.  Es recomendable que tambin se controle de Whitfieldeste modo:  Antes y despus de hacer ejercicio.  Entre las comidas y 2horas despus de Arts administratorcomer.  Ocasionalmente, entre las 2:00a.m. y las 3:00a.m. Diabetes tipo2  Puede ser diferente para cada persona, pero, en general, si recibe insulina, hgase la prueba 4veces por da.  Si toma medicamentos por boca (va oral), hgase la prueba 2veces por da.  Si sigue una dieta controlada, hgase la prueba una vez por da.  Si la diabetes no est bien controlada o si est enfermo, puede ser necesario que se controle con ms frecuencia. CMO CONTROLAR EL NIVEL DE GLUCOSA EN LA SANGRE Insumos necesarios  Medidor de glucosa en la sangre.  Tiras reactivas para el medidor. Cada medidor tiene sus propias tiras reactivas. Debe usar las tiras reactivas correspondientes a su medidor.  Una aguja para pinchar (lanceta).  Un dispositivo que sujeta la lanceta (dispositivo de puncin).  Un diario o libro de anotaciones para YRC Worldwideanotar los resultados. Procedimiento  Lave sus manos con agua y Belarusjabn. No se recomienda usar alcohol.  Pnchese el costado del dedo (no la punta) con Optometristla lanceta.  Apriete suavemente el dedo hasta que aparezca una pequea gota de [place] Parksangre.  Siga las instrucciones que vienen con el medidor para Public affairs consultantinsertar la tira Firefighterreactiva, Contractoraplicar la sangre sobre la tira y usar el medidor de Horticulturist, commercialglucosa en la sangre. Otras zonas de las que se puede tomar sangre para la prueba Algunos medidores le permiten tomar sangre para la prueba de otras zonas del cuerpo (que no son el dedo). Estas reas se llaman sitios alternativos. Los sitios alternativos ms comunes son los siguientes:  El Product managerantebrazo.  El muslo.  La  zona posterior de la parte inferior de la pierna.  La palma de la mano. El flujo de sangre en estas zonas es ms lento. Por lo tanto, los valores de glucosa en la sangre que obtenga pueden estar demorados, y los nmeros son diferentes de los que obtiene de los dedos. No saque sangre de sitios alternativos si cree que tiene hipoglucemia. Los valores no sern precisos. Siempre extraiga del dedo si tiene hipoglucemia. Adems, si no puede  darse cuenta cuando tiene bajos los niveles (hipoglucemia asintomtica), siempre extraiga sangre de los dedos para los controles de glucosa en la [place]. CONSEJOS ADICIONALES PARA EL CONTROL DE LA GLUCOSA  No vuelva a utilizar las lancetas.  Siempre tenga los insumos a mano.  Todos los medidores de glucosa incluyen un nmero de telfono "directo", disponible las 24 horas, al que podr llamar si tiene preguntas o [place].  Ajuste (calibre) el medidor de glucosa con una solucin de control despus de terminar algunas cajas de tiras reactivas. LLEVE REGISTROS DE LOS NIVELES DE GLUCOSA EN LA SANGRE Es recomendable llevar un diario o un registro de los valores de glucosa en la [place]. La Har[employer]son de los medidores de glucosa, sino todos, conservan el registro de la glucosa en el dispositivo. Algunos medidores permiten descargar los registros a su computadora. Llevar un registro de los valores de glucosa en la sangre es especialmente til si desea observar los patrones. Haga anotaciones simultneas con la Microbiologist de los valores de glucosa en la sangre debido a que podra olvidar lo que ocurri en el momento exacto. Llevar un buen registro los ayudar a usted y al mdico a Printmaker juntos para Personnel officer un buen control de la diabetes.  Document Released: 09/07/2005 Document Revised: 01/22/2014 [facility] Patient Information 2015 [place], [place]. This information is not intended to replace advice given to you by your health care provider. Make sure you discuss any questions you  have with your health care provider.  Diabetes y Doroteo Glassman fsica (Diabetes and Exercise) Hacer actividad fsica con regularidad es muy importante. No se trata solo de Johnson [employer]. Tiene muchos otros beneficios, como por ejemplo:  Mejorar el estado fsico, la flexibilidad y la resistencia.  Aumenta la densidad sea.  Ayuda a Art gallery manager.  Disminuye la Art gallery manager.  Aumenta la fuerza muscular.  Reduce el estrs y las tensiones.  Mejora el estado de salud general. Las personas diabticas que realizan actividad fsica tienen beneficios adicionales debido al ejercicio:  Reduce el apetito.  El organismo mejora el uso del azcar (glucosa) de la [place].  Ayuda a disminuir o Engineer, maintenance (IT).  Disminuye la presin arterial.  Ayuda a disminuir los lpidos en la sangre (colesterol y triglicridos).  El organismo mejora el uso de la insulina porque:  Aumenta la sensibilidad del organismo a la insulina.  Reduce las necesidades de insulina del organismo.  Disminuye el riesgo de enfermedad cardaca por la actividad fsica ya que  disminuye el colesterol y TEPPCO Partners triglicridos.  Aumenta los niveles de colesterol bueno (como las lipoprotenas de alta densidad [HDL]) en el organismo.  Disminuye los niveles de glucosa en la [place]. SU PLAN DE ACTIVIDAD  Elija una actividad que disfrute y establezca objetivos realistas. Su mdico o educador en diabetes podrn ayudarlo a encontrar una actividad que lo beneficie. Haga ejercicio regularmente como se lo haya indicado el mdico. Esto incluye:  Hacer entrenamiento de Northrop Grumman a la semana, como flexiones, sentadillas, levantar peso o usar bandas de resistencia.  Practicar de ejercicios cardiovasculares cada semana, como caminar, correr o hacer algn deporte.  Mantenerse activo y no permanecer inactivo durante ms de seguidos. Los perodos cortos de [place] tambin son  beneficiosos. Tres sesiones de a lo largo del da son tan beneficiosas como una sola sesin de . Estas son algunas ideas para los ejercicios:  Lleve a Multimedia programmer.  Utilice las Microbiologist del ascensor.  Baile su cancin  favorita.  Haga los ejercicios de un video de ejercicios.  Haga sus ejercicios favoritos con Leisure centre manager. RECOMENDACIONES PARA REALIZAR EJERCICIOS CUANDO SE TIENE DIABETES TIPO 1 O TIPO 2   Controle la glucosa en la sangre antes de comenzar. Si el nivel de glucosa en la sangre es de ms de 240 mg/dl, controle las cetonas en la [place]. No haga actividad fsica si hay cetonas.  Evite inyectarse insulina en las zonas del cuerpo que ejercitar. Por ejemplo, evite inyectarse insulina en:  Los brazos, si juega al tenis.  Las piernas, si corre.  Lleve un registro de:  Los alimentos que consume antes y despus de Tour manager.  Los momentos esperables de picos de accin de la insulina.  Los niveles de glucosa en la sangre antes y despus de hacer ejercicios.  El tipo y cantidad de [place] fsica que Biomedical engineer.  Revise los registros con su mdico. El mdico lo ayudar a Environmental education officer pautas para ajustar la cantidad de alimento y las cantidades de insulina antes y despus de Radio producer ejercicios.  Si toma insulina o agentes hipoglucemiantes por va oral, observe si hay signos y sntomas de hipoglucemia. Entre los que se incluyen:  Mareos.  Temblores.  Sudoracin.  Escalofros.  Confusin.  Beba gran cantidad de agua mientras hace ejercicios para evitar la deshidratacin o los golpes de Airline pilot. Durante la actividad fsica se pierde agua corporal que se debe reponer.  Comente con su mdico antes de comenzar un programa de actividad fsica para verificar que sea seguro para usted. Recuerde, cualquier actividad es mejor que ninguna. Document Released: 09/27/2007 Document Revised: 01/22/2014 [facility] Patient Information 2015 [place],  [place]. This information is not intended to replace advice given to you by your health care provider. Make sure you discuss any questions you have with your health care provider.

## 2014-10-30 ENCOUNTER — Ambulatory Visit: Payer: Self-pay

## 2015-02-21 ENCOUNTER — Ambulatory Visit: Payer: Self-pay

## 2015-03-19 ENCOUNTER — Emergency Department (HOSPITAL_COMMUNITY)
Admission: EM | Admit: 2015-03-19 | Discharge: 2015-03-19 | Disposition: A | Discharge status: Home / Self Care | Payer: No Typology Code available for payment source | Source: Home / Self Care | Attending: Family Medicine | Admitting: Family Medicine

## 2015-03-19 ENCOUNTER — Encounter (HOSPITAL_COMMUNITY): Payer: Self-pay | Admitting: Emergency Medicine

## 2015-03-19 DIAGNOSIS — E119 Type 2 diabetes mellitus without complications: Secondary | ICD-10-CM

## 2015-03-19 HISTORY — DX: Essential (primary) hypertension: I10

## 2015-03-19 MED ORDER — LISINOPRIL-HYDROCHLOROTHIAZIDE 20-12.5 MG PO TABS: 1.0000 | ORAL_TABLET | Freq: Every day | ORAL | Status: DC

## 2015-03-19 MED ORDER — GABAPENTIN 600 MG PO TABS: 1200.0000 mg | ORAL_TABLET | Freq: Every day | ORAL | Status: DC

## 2015-03-19 MED ORDER — INSULIN DETEMIR 100 UNIT/ML ~~LOC~~ SOLN: 42.0000 [IU] | Freq: Every day | SUBCUTANEOUS | Status: DC

## 2015-03-19 MED ORDER — INSULIN ASPART 100 UNIT/ML ~~LOC~~ SOLN: 6.0000 [IU] | Freq: Three times a day (TID) | SUBCUTANEOUS | Status: DC

## 2015-03-19 NOTE — ED Provider Notes (Addendum)
CSN: [id number]     Arrival date & time 03/19/15  1530 History   First MD Initiated Contact with Patient 03/19/15 1729     Chief Complaint  Patient presents with  . Medication Refill  . Leg Pain   (Consider location/radiation/quality/duration/timing/severity/associated sxs/prior Treatment) Patient is a 49 y.o. female presenting with hyperglycemia. The history is provided by the patient and a relative.  Hyperglycemia Blood sugar level PTA:  124 3d ago. Severity:  Mild Chronicity:  Chronic Diabetes status:  Controlled with insulin (is switching provider and orange card is not active yet for med refill.) Relieved by:  Insulin Associated symptoms: no abdominal pain, no chest pain and no fever     Past Medical History  Diagnosis Date  . Diabetes mellitus   . Hypertension    History reviewed. No pertinent past surgical history. Family History  Problem Relation Age of Onset  . Hyperlipidemia Father   . Hypertension Father   . Hyperlipidemia Father   . Hyperlipidemia Father   . Hyperlipidemia Father    History  Substance Use Topics  . Smoking status: Never Smoker   . Smokeless tobacco: Not on file  . Alcohol Use: No   OB History    No data available     Review of Systems  Constitutional: Negative.  Negative for fever.  Cardiovascular: Negative for chest pain.  Gastrointestinal: Negative.  Negative for abdominal pain.  Genitourinary: Negative.   Skin: Negative.     Allergies  No known allergies  Home Medications   Prior to Admission medications   Medication Sig Start Date End Date Taking? Authorizing Provider  glipiZIDE (GLUCOTROL) 5 MG tablet Take 5 mg by mouth 2 (two) times daily before a meal.   Yes Historical Provider, MD  metFORMIN (GLUMETZA) 1000 MG (MOD) 24 hr tablet Take 1,000 mg by mouth 2 (two) times daily with a meal.   Yes Historical Provider, MD  feeding supplement, GLUCERNA SHAKE, (GLUCERNA SHAKE) LIQD Take 237 mLs by mouth daily at 3 pm. 10/26/14    Elease Etienne, MD  ferrous sulfate 325 (65 FE) MG tablet Take 325 mg by mouth 3 (three) times daily with meals.     Historical Provider, MD  gabapentin (NEURONTIN) 600 MG tablet Take 2 tablets (1,200 mg total) by mouth at bedtime. 03/19/15   Linna Hoff, MD  insulin aspart (NOVOLOG) 100 UNIT/ML injection Inject 6 Units into the skin 3 (three) times daily before meals. 03/19/15   Linna Hoff, MD  insulin detemir (LEVEMIR) 100 UNIT/ML injection Inject 0.42 mLs (42 Units total) into the skin at bedtime. 03/19/15   Linna Hoff, MD  lisinopril-hydrochlorothiazide (PRINZIDE,ZESTORETIC) 20-12.5 MG per tablet Take 1 tablet by mouth daily. For blood pressure 03/19/15   Linna Hoff, MD  neomycin-bacitracin-polymyxin (NEOSPORIN) OINT Apply 1 application topically 2 (two) times daily. Apply to  Left Posterior Thigh Abrasion 10/26/14   Elease Etienne, MD   BP 138/83 mmHg  Pulse 97  Temp(Src) 98.2 F (36.8 C) (Oral)  Resp 14  SpO2 100% Physical Exam  Constitutional: She is oriented to person, place, and time. She appears well-developed and well-nourished. No distress.  Cardiovascular: Normal heart sounds.   Pulmonary/Chest: Breath sounds normal.  Abdominal: Soft. Bowel sounds are normal. There is no tenderness.  Neurological: She is alert and oriented to person, place, and time.  Skin: Skin is warm and dry.  Nursing note and vitals reviewed.   ED Course  Procedures (including critical care time)  Labs Review Labs Reviewed - No data to display  Imaging Review No results found.   MDM   1. Diabetes mellitus type 2 in nonobese        Linna HoffJames D Cambell Stanek, MD 03/19/15 1756  Linna HoffJames D Caterine Mcmeans, MD 03/19/15 1800

## 2015-03-19 NOTE — ED Notes (Signed)
Pt here for medication refill.  Last dose of meds 3 days ago.  Gabapentin 600 mg,levemir  42 unitis, novolog 10 ml 100 unitis / ml., and BP med but can not remember the name.     Also c/o numbness/tingling in legs.  "feels like they are sleep all the time".    Dizziness off /on for one month.  Last blood sugar check it was at 125 3 days ago.

## 2015-03-19 NOTE — Discharge Instructions (Signed)
Take medicine as prescribed and see your doctor as planned. °

## 2015-05-12 ENCOUNTER — Encounter (HOSPITAL_COMMUNITY): Payer: Self-pay

## 2015-05-12 DIAGNOSIS — Z794 Long term (current) use of insulin: Secondary | ICD-10-CM

## 2015-05-12 DIAGNOSIS — I214 Non-ST elevation (NSTEMI) myocardial infarction: Principal | ICD-10-CM | POA: Diagnosis present

## 2015-05-12 DIAGNOSIS — I2542 Coronary artery dissection: Secondary | ICD-10-CM | POA: Diagnosis present

## 2015-05-12 DIAGNOSIS — I10 Essential (primary) hypertension: Secondary | ICD-10-CM | POA: Diagnosis present

## 2015-05-12 DIAGNOSIS — E1165 Type 2 diabetes mellitus with hyperglycemia: Secondary | ICD-10-CM | POA: Diagnosis present

## 2015-05-12 DIAGNOSIS — I2510 Atherosclerotic heart disease of native coronary artery without angina pectoris: Secondary | ICD-10-CM | POA: Diagnosis present

## 2015-05-12 DIAGNOSIS — D649 Anemia, unspecified: Secondary | ICD-10-CM | POA: Diagnosis present

## 2015-05-12 DIAGNOSIS — N179 Acute kidney failure, unspecified: Secondary | ICD-10-CM | POA: Diagnosis present

## 2015-05-12 LAB — BASIC METABOLIC PANEL WITH GFR
Anion gap: 12 (ref 5–15)
BUN: 36 mg/dL — ABNORMAL HIGH (ref 6–20)
CO2: 21 mmol/L — ABNORMAL LOW (ref 22–32)
Calcium: 9 mg/dL (ref 8.9–10.3)
Chloride: 100 mmol/L — ABNORMAL LOW (ref 101–111)
Creatinine, Ser: 1.98 mg/dL — ABNORMAL HIGH (ref 0.44–1.00)
GFR calc Af Amer: 33 mL/min — ABNORMAL LOW
GFR calc non Af Amer: 28 mL/min — ABNORMAL LOW
Glucose, Bld: 261 mg/dL — ABNORMAL HIGH (ref 65–99)
Potassium: 4.1 mmol/L (ref 3.5–5.1)
Sodium: 133 mmol/L — ABNORMAL LOW (ref 135–145)

## 2015-05-12 LAB — CBC
HCT: 29.6 % — ABNORMAL LOW (ref 36.0–46.0)
Hemoglobin: 10 g/dL — ABNORMAL LOW (ref 12.0–15.0)
MCH: 29.1 pg (ref 26.0–34.0)
MCHC: 33.8 g/dL (ref 30.0–36.0)
MCV: 86 fL (ref 78.0–100.0)
Platelets: 327 K/uL (ref 150–400)
RBC: 3.44 MIL/uL — ABNORMAL LOW (ref 3.87–5.11)
RDW: 12.8 % (ref 11.5–15.5)
WBC: 9.6 K/uL (ref 4.0–10.5)

## 2015-05-12 LAB — I-STAT BETA HCG BLOOD, ED (MC, WL, AP ONLY)

## 2015-05-12 LAB — CBG MONITORING, ED: Glucose-Capillary: 265 mg/dL — ABNORMAL HIGH (ref 65–99)

## 2015-05-12 NOTE — ED Notes (Signed)
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  Pt has been dizzy since, has to hold onto furniture, walls when walking.  No recent illness.

## 2015-05-13 ENCOUNTER — Encounter (HOSPITAL_COMMUNITY): Payer: Self-pay | Admitting: Cardiology

## 2015-05-13 ENCOUNTER — Observation Stay (HOSPITAL_COMMUNITY): Payer: No Typology Code available for payment source

## 2015-05-13 ENCOUNTER — Inpatient Hospital Stay (HOSPITAL_COMMUNITY)
Admission: EM | Admit: 2015-05-13 | Discharge: 2015-05-16 | DRG: 280 | Disposition: A | Discharge status: Home / Self Care | Payer: Self-pay | Attending: Cardiology | Admitting: Cardiology

## 2015-05-13 DIAGNOSIS — R55 Syncope and collapse: Secondary | ICD-10-CM

## 2015-05-13 DIAGNOSIS — R079 Chest pain, unspecified: Secondary | ICD-10-CM

## 2015-05-13 DIAGNOSIS — I10 Essential (primary) hypertension: Secondary | ICD-10-CM | POA: Diagnosis present

## 2015-05-13 DIAGNOSIS — IMO0002 Reserved for concepts with insufficient information to code with codable children: Secondary | ICD-10-CM | POA: Diagnosis present

## 2015-05-13 DIAGNOSIS — E1165 Type 2 diabetes mellitus with hyperglycemia: Secondary | ICD-10-CM | POA: Diagnosis present

## 2015-05-13 DIAGNOSIS — I214 Non-ST elevation (NSTEMI) myocardial infarction: Principal | ICD-10-CM | POA: Diagnosis present

## 2015-05-13 DIAGNOSIS — N179 Acute kidney failure, unspecified: Secondary | ICD-10-CM | POA: Diagnosis present

## 2015-05-13 DIAGNOSIS — R7989 Other specified abnormal findings of blood chemistry: Secondary | ICD-10-CM

## 2015-05-13 DIAGNOSIS — I2510 Atherosclerotic heart disease of native coronary artery without angina pectoris: Secondary | ICD-10-CM | POA: Diagnosis present

## 2015-05-13 LAB — LIPID PANEL
CHOL/HDL RATIO: 4.1 ratio
Cholesterol: 143 mg/dL (ref 0–200)
HDL: 35 mg/dL — AB (ref >40)
LDL CALC: 85 mg/dL (ref 0–99)
TRIGLYCERIDES: 117 mg/dL (ref <150)
VLDL: 23 mg/dL (ref 0–40)

## 2015-05-13 LAB — I-STAT TROPONIN, ED: Troponin i, poc: 0.15 ng/mL (ref 0.00–0.08)

## 2015-05-13 LAB — URINALYSIS, ROUTINE W REFLEX MICROSCOPIC
Bilirubin Urine: NEGATIVE
Glucose, UA: 500 mg/dL — AB
Ketones, ur: NEGATIVE mg/dL
Leukocytes, UA: NEGATIVE
NITRITE: NEGATIVE
Protein, ur: NEGATIVE mg/dL
SPECIFIC GRAVITY, URINE: 1.011 (ref 1.005–1.030)
Urobilinogen, UA: 0.2 mg/dL (ref 0.0–1.0)
pH: 5 (ref 5.0–8.0)

## 2015-05-13 LAB — CBC WITH DIFFERENTIAL/PLATELET
BASOS ABS: 0.1 10*3/uL (ref 0.0–0.1)
Basophils Relative: 1 % (ref 0–1)
EOS ABS: 0.2 10*3/uL (ref 0.0–0.7)
EOS PCT: 2 % (ref 0–5)
HCT: 32.4 % — ABNORMAL LOW (ref 36.0–46.0)
Hemoglobin: 11 g/dL — ABNORMAL LOW (ref 12.0–15.0)
LYMPHS ABS: 1.9 10*3/uL (ref 0.7–4.0)
Lymphocytes Relative: 25 % (ref 12–46)
MCH: 29.4 pg (ref 26.0–34.0)
MCHC: 34 g/dL (ref 30.0–36.0)
MCV: 86.6 fL (ref 78.0–100.0)
Monocytes Absolute: 0.7 10*3/uL (ref 0.1–1.0)
Monocytes Relative: 9 % (ref 3–12)
Neutro Abs: 4.6 10*3/uL (ref 1.7–7.7)
Neutrophils Relative %: 63 % (ref 43–77)
PLATELETS: 315 10*3/uL (ref 150–400)
RBC: 3.74 MIL/uL — AB (ref 3.87–5.11)
RDW: 12.8 % (ref 11.5–15.5)
WBC: 7.3 10*3/uL (ref 4.0–10.5)

## 2015-05-13 LAB — COMPREHENSIVE METABOLIC PANEL
ALBUMIN: 3.5 g/dL (ref 3.5–5.0)
ALT: 23 U/L (ref 14–54)
AST: 21 U/L (ref 15–41)
Alkaline Phosphatase: 91 U/L (ref 38–126)
Anion gap: 10 (ref 5–15)
BUN: 30 mg/dL — AB (ref 6–20)
CHLORIDE: 103 mmol/L (ref 101–111)
CO2: 22 mmol/L (ref 22–32)
Calcium: 8.7 mg/dL — ABNORMAL LOW (ref 8.9–10.3)
Creatinine, Ser: 1.23 mg/dL — ABNORMAL HIGH (ref 0.44–1.00)
GFR calc Af Amer: 59 mL/min — ABNORMAL LOW (ref >60)
GFR calc non Af Amer: 51 mL/min — ABNORMAL LOW (ref >60)
GLUCOSE: 323 mg/dL — AB (ref 65–99)
POTASSIUM: 4 mmol/L (ref 3.5–5.1)
Sodium: 135 mmol/L (ref 135–145)
Total Bilirubin: 0.6 mg/dL (ref 0.3–1.2)
Total Protein: 6.7 g/dL (ref 6.5–8.1)

## 2015-05-13 LAB — IRON AND TIBC
IRON: 52 ug/dL (ref 28–170)
SATURATION RATIOS: 18 % (ref 10.4–31.8)
TIBC: 295 ug/dL (ref 250–450)
UIBC: 243 ug/dL

## 2015-05-13 LAB — GLUCOSE, CAPILLARY
GLUCOSE-CAPILLARY: 306 mg/dL — AB (ref 65–99)
GLUCOSE-CAPILLARY: 267 mg/dL — AB (ref 65–99)
Glucose-Capillary: 292 mg/dL — ABNORMAL HIGH (ref 65–99)
Glucose-Capillary: 219 mg/dL — ABNORMAL HIGH (ref 65–99)

## 2015-05-13 LAB — FERRITIN: Ferritin: 25 ng/mL (ref 11–307)

## 2015-05-13 LAB — VITAMIN B12: Vitamin B-12: 668 pg/mL (ref 180–914)

## 2015-05-13 LAB — TROPONIN I
TROPONIN I: 0.1 ng/mL — AB (ref <0.031)
TROPONIN I: 0.1 ng/mL — AB (ref <0.031)
TROPONIN I: 0.09 ng/mL — AB (ref <0.031)
TROPONIN I: 0.1 ng/mL — AB (ref <0.031)
Troponin I: 0.11 ng/mL — ABNORMAL HIGH (ref <0.031)

## 2015-05-13 LAB — PROTIME-INR
INR: 1.08 (ref 0.00–1.49)
Prothrombin Time: 14.2 seconds (ref 11.6–15.2)

## 2015-05-13 LAB — APTT: APTT: 30 s (ref 24–37)

## 2015-05-13 LAB — MAGNESIUM: Magnesium: 2.3 mg/dL (ref 1.7–2.4)

## 2015-05-13 LAB — TSH: TSH: 0.928 u[IU]/mL (ref 0.350–4.500)

## 2015-05-13 LAB — HEPARIN LEVEL (UNFRACTIONATED): HEPARIN UNFRACTIONATED: 0.31 [IU]/mL (ref 0.30–0.70)

## 2015-05-13 LAB — URINE MICROSCOPIC-ADD ON

## 2015-05-13 MED ORDER — INSULIN ASPART 100 UNIT/ML ~~LOC~~ SOLN
6.0000 [IU] | Freq: Three times a day (TID) | SUBCUTANEOUS | Status: DC
  Administered 2015-05-13 – 2015-05-16 (×8): 6 [IU] via SUBCUTANEOUS

## 2015-05-13 MED ORDER — SODIUM CHLORIDE 0.9 % IV SOLN
INTRAVENOUS | Status: DC
  Administered 2015-05-13: 06:00:00 via INTRAVENOUS

## 2015-05-13 MED ORDER — ASPIRIN 81 MG PO CHEW: 324.0000 mg | CHEWABLE_TABLET | ORAL | Status: DC

## 2015-05-13 MED ORDER — GABAPENTIN 600 MG PO TABS
1200.0000 mg | ORAL_TABLET | Freq: Every day | ORAL | Status: DC
  Administered 2015-05-13 – 2015-05-14 (×2): 1200 mg via ORAL
  Filled 2015-05-13 (×4): qty 2

## 2015-05-13 MED ORDER — ASPIRIN EC 81 MG PO TBEC
81.0000 mg | DELAYED_RELEASE_TABLET | Freq: Every day | ORAL | Status: DC
  Administered 2015-05-15 – 2015-05-16 (×2): 81 mg via ORAL
  Filled 2015-05-13 (×3): qty 1

## 2015-05-13 MED ORDER — INSULIN DETEMIR 100 UNIT/ML ~~LOC~~ SOLN
42.0000 [IU] | Freq: Every day | SUBCUTANEOUS | Status: DC
  Administered 2015-05-13 – 2015-05-15 (×3): 42 [IU] via SUBCUTANEOUS
  Filled 2015-05-13 (×4): qty 0.42

## 2015-05-13 MED ORDER — ATORVASTATIN CALCIUM 80 MG PO TABS
80.0000 mg | ORAL_TABLET | Freq: Every day | ORAL | Status: DC
  Administered 2015-05-13 – 2015-05-15 (×3): 80 mg via ORAL
  Filled 2015-05-13 (×3): qty 1

## 2015-05-13 MED ORDER — ONDANSETRON HCL 4 MG/2ML IJ SOLN: 4.0000 mg | Freq: Four times a day (QID) | INTRAMUSCULAR | Status: DC | PRN

## 2015-05-13 MED ORDER — HEPARIN (PORCINE) IN NACL 100-0.45 UNIT/ML-% IJ SOLN
1050.0000 [IU]/h | INTRAMUSCULAR | Status: DC
  Filled 2015-05-13: qty 250

## 2015-05-13 MED ORDER — ACETAMINOPHEN 325 MG PO TABS: 650.0000 mg | ORAL_TABLET | ORAL | Status: DC | PRN

## 2015-05-13 MED ORDER — INSULIN ASPART 100 UNIT/ML ~~LOC~~ SOLN
0.0000 [IU] | Freq: Three times a day (TID) | SUBCUTANEOUS | Status: DC
  Administered 2015-05-13: 5 [IU] via SUBCUTANEOUS
  Administered 2015-05-13: 7 [IU] via SUBCUTANEOUS
  Administered 2015-05-13: 5 [IU] via SUBCUTANEOUS
  Administered 2015-05-14 – 2015-05-15 (×3): 2 [IU] via SUBCUTANEOUS
  Administered 2015-05-16: 1 [IU] via SUBCUTANEOUS

## 2015-05-13 MED ORDER — ASPIRIN 325 MG PO TABS
325.0000 mg | ORAL_TABLET | Freq: Once | ORAL | Status: AC
  Administered 2015-05-13: 325 mg via ORAL
  Filled 2015-05-13: qty 1

## 2015-05-13 MED ORDER — SODIUM CHLORIDE 0.9 % IV BOLUS (SEPSIS)
1000.0000 mL | Freq: Once | INTRAVENOUS | Status: AC
  Administered 2015-05-13: 1000 mL via INTRAVENOUS

## 2015-05-13 MED ORDER — INSULIN ASPART 100 UNIT/ML ~~LOC~~ SOLN
0.0000 [IU] | Freq: Every day | SUBCUTANEOUS | Status: DC
  Administered 2015-05-13: 2 [IU] via SUBCUTANEOUS

## 2015-05-13 MED ORDER — CARVEDILOL 3.125 MG PO TABS
3.1250 mg | ORAL_TABLET | Freq: Two times a day (BID) | ORAL | Status: DC
  Administered 2015-05-13 – 2015-05-14 (×3): 3.125 mg via ORAL
  Filled 2015-05-13 (×4): qty 1

## 2015-05-13 MED ORDER — INSULIN ASPART 100 UNIT/ML ~~LOC~~ SOLN: 0.0000 [IU] | Freq: Three times a day (TID) | SUBCUTANEOUS | Status: DC

## 2015-05-13 MED ORDER — NITROGLYCERIN 0.4 MG SL SUBL: 0.4000 mg | SUBLINGUAL_TABLET | SUBLINGUAL | Status: DC | PRN

## 2015-05-13 MED ORDER — HEPARIN (PORCINE) IN NACL 100-0.45 UNIT/ML-% IJ SOLN
800.0000 [IU]/h | INTRAMUSCULAR | Status: DC
Start: 2015-05-13 — End: 2015-05-13
  Administered 2015-05-13: 800 [IU]/h via INTRAVENOUS
  Filled 2015-05-13: qty 250

## 2015-05-13 MED ORDER — HEPARIN BOLUS VIA INFUSION
3500.0000 [IU] | Freq: Once | INTRAVENOUS | Status: AC
  Administered 2015-05-13: 3500 [IU] via INTRAVENOUS
  Filled 2015-05-13: qty 3500

## 2015-05-13 MED ORDER — ASPIRIN 300 MG RE SUPP: 300.0000 mg | RECTAL | Status: DC

## 2015-05-13 NOTE — ED Provider Notes (Signed)
CSN: [id number]     Arrival date & time 05/12/15  2106 History  This chart was scribed for Tomasita Crumble, MD by Leone Payor, ED Scribe. This patient was seen in room A13C/A13C and the patient's care was started 12:53 AM.    Chief Complaint  Patient presents with  . Loss of Consciousness   The history is provided by the patient and a relative. No language interpreter was used.    HPI Comments: Rebecca [name] is a 49 y.o. female with past medical history of DM, HTN who presents to the Emergency Department complaining of an episode of syncope that occurred about 8 hours ago. Per family member who is translating, patient had 3 episodes of vomiting prior to the episode. She states she fell, landing on her left hip and knee. She currently complains of constant weakness and dizziness. She states she has not been drinking well for the past few days and is unable to void for a urine sample; last PO was 5pm. She denies sick contacts. She denies abdominal pain, diarrhea, dysuria, fever, nausea.   Past Medical History  Diagnosis Date  . Diabetes mellitus   . Hypertension    History reviewed. No pertinent past surgical history. Family History  Problem Relation Age of Onset  . Hyperlipidemia Father   . Hypertension Father   . Hyperlipidemia Father   . Hyperlipidemia Father   . Hyperlipidemia Father    Social History  Substance Use Topics  . Smoking status: Never Smoker   . Smokeless tobacco: None  . Alcohol Use: No   OB History    No data available     Review of Systems  10 Systems reviewed and all are negative for acute change except as noted in the HPI.   Allergies  No known allergies  Home Medications   Prior to Admission medications   Medication Sig Start Date End Date Taking? Authorizing Provider  feeding supplement, GLUCERNA SHAKE, (GLUCERNA SHAKE) LIQD Take 237 mLs by mouth daily at 3 pm. 10/26/14   Elease Etienne, MD  ferrous sulfate 325 (65 FE) MG tablet Take 325 mg by  mouth 3 (three) times daily with meals.     Historical Provider, MD  gabapentin (NEURONTIN) 600 MG tablet Take 2 tablets (1,200 mg total) by mouth at bedtime. 03/19/15   Linna Hoff, MD  glipiZIDE (GLUCOTROL) 5 MG tablet Take 5 mg by mouth 2 (two) times daily before a meal.    Historical Provider, MD  insulin aspart (NOVOLOG) 100 UNIT/ML injection Inject 6 Units into the skin 3 (three) times daily before meals. 03/19/15   Linna Hoff, MD  insulin detemir (LEVEMIR) 100 UNIT/ML injection Inject 0.42 mLs (42 Units total) into the skin at bedtime. 03/19/15   Linna Hoff, MD  lisinopril-hydrochlorothiazide (PRINZIDE,ZESTORETIC) 20-12.5 MG per tablet Take 1 tablet by mouth daily. For blood pressure 03/19/15   Linna Hoff, MD  metFORMIN (GLUMETZA) 1000 MG (MOD) 24 hr tablet Take 1,000 mg by mouth 2 (two) times daily with a meal.    Historical Provider, MD  neomycin-bacitracin-polymyxin (NEOSPORIN) OINT Apply 1 application topically 2 (two) times daily. Apply to  Left Posterior Thigh Abrasion 10/26/14   Elease Etienne, MD   BP 107/49 mmHg  Pulse 99  Temp(Src) 98.6 F (37 C) (Oral)  Resp 16  Ht 5\' 2"  (1.575 m)  Wt 160 lb 14.4 oz (72.984 kg)  BMI 29.42 kg/m2  SpO2 99%  LMP 04/10/2015 Physical Exam  Constitutional: She is oriented to person, place, and time. She appears well-developed and well-nourished. No distress.  HENT:  Head: Normocephalic and atraumatic.  Nose: Nose normal.  Mouth/Throat: Oropharynx is clear and moist. No oropharyngeal exudate.  Eyes: Conjunctivae and EOM are normal. Pupils are equal, round, and reactive to light. No scleral icterus.  Neck: Normal range of motion. Neck supple. No JVD present. No tracheal deviation present. No thyromegaly present.  Cardiovascular: Normal rate, regular rhythm and normal heart sounds.  Exam reveals no gallop and no friction rub.   No murmur heard. Pulmonary/Chest: Effort normal and breath sounds normal. No respiratory distress. She has no  wheezes. She exhibits no tenderness.  Abdominal: Soft. Bowel sounds are normal. She exhibits no distension and no mass. There is no tenderness. There is no rebound and no guarding.  Musculoskeletal: Normal range of motion. She exhibits no edema or tenderness.  Lymphadenopathy:    She has no cervical adenopathy.  Neurological: She is alert and oriented to person, place, and time. No cranial nerve deficit. She exhibits normal muscle tone.  Normal strength and sensation in all extremities. Normal cerebellar testing.   Skin: Skin is warm and dry. No rash noted. No erythema. No pallor.  Nursing note and vitals reviewed.   ED Course  Procedures (including critical care time)  DIAGNOSTIC STUDIES: Oxygen Saturation is 99% on RA, normal by my interpretation.    COORDINATION OF CARE: 12:59 AM Discussed treatment plan with pt at bedside and pt agreed to plan.   Labs Review Labs Reviewed  BASIC METABOLIC PANEL - Abnormal; Notable for the following:    Sodium 133 (*)    Chloride 100 (*)    CO2 21 (*)    Glucose, Bld 261 (*)    BUN 36 (*)    Creatinine, Ser 1.98 (*)    GFR calc non Af Amer 28 (*)    GFR calc Af Amer 33 (*)    All other components within normal limits  CBC - Abnormal; Notable for the following:    RBC 3.44 (*)    Hemoglobin 10.0 (*)    HCT 29.6 (*)    All other components within normal limits  CBG MONITORING, ED - Abnormal; Notable for the following:    Glucose-Capillary 265 (*)    All other components within normal limits  I-STAT TROPOININ, ED - Abnormal; Notable for the following:    Troponin i, poc 0.15 (*)    All other components within normal limits  URINALYSIS, ROUTINE W REFLEX MICROSCOPIC (NOT AT [facility])  I-STAT BETA HCG BLOOD, ED ([facility], [facility], [facility] ONLY)    Imaging Review No results found. I have personally reviewed and evaluated these images and lab results as part of my medical decision-making.   EKG Interpretation   Date/Time:  Sunday May 12 2015 21:22:12  EDT Ventricular Rate:  100 PR Interval:  136 QRS Duration: 88 QT Interval:  360 QTC Calculation: 464 R Axis:   76 Text Interpretation:  Normal sinus rhythm Normal ECG Confirmed by Erroll Luna [phone]) on 05/13/2015 2:47:42 AM      MDM   Final diagnoses:  None   Patient presents for nausea and vomiting and dizziness.  She has no other symptoms and this could be anginal equivalent as the patient is female and diabetic and can present atypically.  EKG shows TWI in the inferior leads which is new.  Troponin is elevated as well.  Patient has an NSTEMI.  Dr. Mayford Knife was  paged for consultation and will come to the ED for evaluation.     CRITICAL CARE Performed by: Tomasita Crumble   Total critical care time: - NSTEMI  Critical care time was exclusive of separately billable procedures and treating other patients.  Critical care was necessary to treat or prevent imminent or life-threatening deterioration.  Critical care was time spent personally by me on the following activities: development of treatment plan with patient and/or surrogate as well as nursing, discussions with consultants, evaluation of patient's response to treatment, examination of patient, obtaining history from patient or surrogate, ordering and performing treatments and interventions, ordering and review of laboratory studies, ordering and review of radiographic studies, pulse oximetry and re-evaluation of patient's condition.   I personally performed the services described in this documentation, which was scribed in my presence. The recorded information has been reviewed and is accurate.     Tomasita Crumble, MD 05/14/15 [phone]

## 2015-05-13 NOTE — H&P (Addendum)
Admit date: 05/13/2015 Referring Physician: Dr. Mora Bellman Primary Cardiologist: None Chief complaint/reason for admission:: Chest pain  HPI: This is a 49yo Hispanic female with a history of DM and HTN who presented to the ER with complaints of nausea.  She ate dinner around 5pm and then became nauseated and started vomiting.  She vomited 3 times and then 5 minutes later had syncope.  She says that after stopping vomiting she became dizzy and passed out.  She fell on her hip and knee but did not hit her head.  She denies any chest pain or SOB associated with the event.  She denies any history of chest pain or SOB in the past.  Troponin was noted to be elevated and now Cardiology is asked to admit.      PMH:    Past Medical History  Diagnosis Date  . Diabetes mellitus   . Hypertension     PSH:   History reviewed. No pertinent past surgical history.  ALLERGIES:   No known allergies  Prior to Admit Meds:   (Not in a hospital admission) Family HX:    Family History  Problem Relation Age of Onset  . Hypertension Father   . Hyperlipidemia Father   . Cancer Mother   . Diabetes Sister   . Diabetes Brother   . Diabetes Sister   . Diabetes Sister   . Diabetes Sister   . Diabetes Brother   . Diabetes Brother   . Diabetes Brother   . Diabetes Brother    Social HX:    Social History   Social History  . Marital Status: Married    Spouse Name: N/A  . Number of Children: N/A  . Years of Education: N/A   Occupational History  . Not on file.   Social History Main Topics  . Smoking status: Never Smoker   . Smokeless tobacco: Not on file  . Alcohol Use: No  . Drug Use: No  . Sexual Activity: Yes   Other Topics Concern  . Not on file   Social History Narrative     ROS:  All 11 ROS were addressed and are negative except what is stated in the HPI  PHYSICAL EXAM Filed Vitals:   05/13/15 0330  BP: 140/76  Pulse: 90  Temp:   Resp:    General: Well developed, well nourished, in  no acute distress Head: Eyes PERRLA, No xanthomas.   Normal cephalic and atramatic  Lungs:   Clear bilaterally to auscultation and percussion. Heart:   HRRR S1 S2 Pulses are 2+ & equal.            No carotid bruit. No JVD.  No abdominal bruits. No femoral bruits. Abdomen: Bowel sounds are positive, abdomen soft and non-tender without masses  Extremities:   No clubbing, cyanosis or edema.  DP +1 Neuro: Alert and oriented X 3. Psych:  Good affect, responds appropriately   Labs:  Lab Results  Component Value Date   WBC 9.6 05/12/2015   HGB 10.0* 05/12/2015   HCT 29.6* 05/12/2015   MCV 86.0 05/12/2015   PLT 327 05/12/2015    Recent Labs Lab 05/12/15 2131  NA 133*  K 4.1  CL 100*  CO2 21*  BUN 36*  CREATININE 1.98*  CALCIUM 9.0  GLUCOSE 261*   Lab Results  Component Value Date   CKTOTAL 55 06/29/2008   CKMB 1.1 06/29/2008   TROPONINI <0.30 08/16/2012   No results found for: PTT Lab Results  Component Value Date   INR 1.0 06/29/2008     Lab Results  Component Value Date   CHOL 160 11/04/2010   CHOL 194 01/06/2010   CHOL 208* 01/30/2008   Lab Results  Component Value Date   HDL 55 11/04/2010   HDL 68 01/06/2010   HDL 48 01/30/2008   Lab Results  Component Value Date   LDLCALC 91 11/04/2010   LDLCALC 108* 01/06/2010   LDLCALC 129* 01/30/2008   Lab Results  Component Value Date   TRIG 69 11/04/2010   TRIG 89 01/06/2010   TRIG 155* 01/30/2008   Lab Results  Component Value Date   CHOLHDL 2.9 Ratio 11/04/2010   CHOLHDL 2.9 Ratio 01/06/2010   CHOLHDL 4.3 Ratio 01/30/2008   No results found for: LDLDIRECT    Radiology:  No results found.  EKG:  NSR with TWI in III and aVF  ASSESSMENT/PLAN: 1.  Syncope most likely related vagal episode from N/V but cannot exclude ischemia. 2.  Elevated troponin in setting of N/V.  This could be related to demand ischemia from N/V but she also has new at wave changes in the inferior leads concerning for ischemia.  Troponin was a POC so will recheck a regular troponin and if elevated will treat as a NSTEMI.  She denies any chest pain.  Start IV Heparin gtt if troponin on repeat is elevated.  Start ASA/continue statin.  NPO for now. 3.  Nausea and vomiting immediately after dinner. This has resolved. ? Etiology.  This happened immediately after eating and she had felt well prior. 4.  DM on Insulin and Metformin.  Will hold Metformin for now in case she needs cath.   5.  HTN - controlled.  Hold Prinizide due to acute renal insuff. 6.  Acute renal insuff ? Secondary to dehydration from #3.  Gently hydrate. 7.  Anemia - she has no history of this - will heme check stools and get iron panel, B12 and folate   Quintella Reichert, MD  05/13/2015  4:37 AM

## 2015-05-13 NOTE — Progress Notes (Signed)
Per day shift nurse.Office of inclusion has been contacted and a message left for an interpretor to obtain consent and accompany patient to cath lab procedure tomorrow. Will continue to monitor.  Sandrea Hammond RN

## 2015-05-13 NOTE — Progress Notes (Signed)
Pt arrived to unit. Report received. No complaints of pain. VSS. Pt oriented to room. Call bell in place. Will continue to monitor.  Sandrea Hammond RN

## 2015-05-13 NOTE — Progress Notes (Signed)
  Echocardiogram 2D Echocardiogram has been performed.  Rebecca [name] 05/13/2015, 10:21 AM

## 2015-05-13 NOTE — Progress Notes (Signed)
49 year old female with risk factors - DM, HTN, typical chest pain, new ECG changes and elevated troponin, we will placed on a cath schedule for tomorrow.  Add heparin, continue ASA, atorvastatin, add carvedilol 3.125 mg po BID.  Lars Masson 05/13/2015

## 2015-05-13 NOTE — Progress Notes (Signed)
Inpatient Diabetes Program Recommendations  AACE/ADA: New Consensus Statement on Inpatient Glycemic Control (2013)  Target Ranges:  Prepandial:   less than 140 mg/dL      Peak postprandial:   less than 180 mg/dL (1-2 hours)      Critically ill patients:  140 - 180 mg/dL   Results for Rebecca [name], Rebecca [name] (MRN [id number]) as of 05/13/2015 08:36  Ref. Range 05/12/2015 21:33 05/13/2015 07:52  Glucose-Capillary Latest Ref Range: 65-99 mg/dL 409 (H) 811 (H)   Reason for Admission: Syncope  Diabetes history: DM 2 Outpatient Diabetes medications: Lantus 42 units QHS, Novolog 6 units TID meal coverage, Metformin 1,000mg  BID Current orders for Inpatient glycemic control: Lantus 42 units QHS, Novolog 6 units TID  Inpatient Diabetes Program Recommendations Insulin - Basal: Glucose is 290's. Consider changing time of basal to be given this morning glucose may continue to rise. Patient never received dose last night. Insulin - Meal Coverage: While NPO please start patient on Novolog Sensitive Correction scale.  Thanks,  Christena Deem RN, MSN, [facility] Inpatient Diabetes Coordinator Team Pager [phone] (8am-5pm)

## 2015-05-13 NOTE — Progress Notes (Signed)
05/13/2015 8:43 AM Nursing note Noted pt. CBG elevated this am at 292. PT. NPO at this time, unable to give meal coverage per protocol. Nada Boozer NP paged and made aware. Orders received for Diabetic Diet and NP to place orders for SSI. Orders enacted. Will continue to closely monitor patient.  Kadin Canipe, Blanchard Kelch

## 2015-05-13 NOTE — Progress Notes (Addendum)
ANTICOAGULATION CONSULT NOTE - Initial Consult  Pharmacy Consult for heparin Indication: chest pain/ACS  Allergies  Allergen Reactions  . No Known Allergies     Patient Measurements: Height:  (157.5 cm) Weight: 160 lb 14.4 oz (72.984 kg) IBW/kg (Calculated) : 50.1 Heparin Dosing Weight: 65.7kg  Vital Signs: Temp: 97.7 F (36.5 C) (08/22 0622) Temp Source: Oral (08/22 0622) BP: 127/51 mmHg (08/22 0622) Pulse Rate: 95 (08/22 0622)  Labs:  Recent Labs  05/12/15 2131 05/13/15 0731  HGB 10.0* 11.0*  HCT 29.6* 32.4*  PLT 327 315  APTT  --  30  LABPROT  --  14.2  INR  --  1.08  CREATININE 1.98* 1.23*  TROPONINI  --  0.10*  0.11*  0.10*    Estimated Creatinine Clearance: 51.8 mL/min (by C-G formula based on Cr of 1.23).   Medical History: Past Medical History  Diagnosis Date  . Diabetes mellitus   . Hypertension     Assessment: 49 year old female with risk factors - DM, HTN, typical chest pain, new ECG changes and elevated troponin, placed on cath schedule for tomorrow. Pharmacy consulted to dose heparin for ACS/CP (no anticoag pta). Hg 11 (up), plt stable wnl. No bleeding issues documented. For cath tomorrow.    Goal of Therapy:  Heparin level 0.3-0.7 units/ml Monitor platelets by anticoagulation protocol: Yes   Plan:  Heparin 3500 unit bolus Heparin @ 800 units/h 6h HL Daily HL/CBC Mon s/sx bleeding Cath planned tomorrow  Babs Bertin, PharmD Clinical Pharmacist Pager [phone] 05/13/2015 11:00 AM    ========================   Addendum: - HL 0.31, low therapeutic range and drawn 1 hour early - no bleeding   Plan: - Increase heparin gtt to 900 units/hr - Check confirmatory HL    Porschia Willbanks D. Laney Potash, PharmD, BCPS Pager:  [phone] 05/13/2015, 5:30 PM

## 2015-05-14 ENCOUNTER — Encounter (HOSPITAL_COMMUNITY): Payer: Self-pay | Admitting: Cardiology

## 2015-05-14 ENCOUNTER — Encounter (HOSPITAL_COMMUNITY)
Admission: EM | Disposition: A | Discharge status: Home / Self Care | Payer: Self-pay | Source: Home / Self Care | Attending: Cardiovascular Disease

## 2015-05-14 DIAGNOSIS — E1165 Type 2 diabetes mellitus with hyperglycemia: Secondary | ICD-10-CM

## 2015-05-14 DIAGNOSIS — N179 Acute kidney failure, unspecified: Secondary | ICD-10-CM

## 2015-05-14 DIAGNOSIS — I214 Non-ST elevation (NSTEMI) myocardial infarction: Secondary | ICD-10-CM | POA: Diagnosis present

## 2015-05-14 DIAGNOSIS — I10 Essential (primary) hypertension: Secondary | ICD-10-CM | POA: Diagnosis present

## 2015-05-14 DIAGNOSIS — I2510 Atherosclerotic heart disease of native coronary artery without angina pectoris: Secondary | ICD-10-CM | POA: Diagnosis present

## 2015-05-14 HISTORY — PX: CARDIAC CATHETERIZATION: SHX172

## 2015-05-14 LAB — CBC
HCT: 29.3 % — ABNORMAL LOW (ref 36.0–46.0)
HCT: 30.8 % — ABNORMAL LOW (ref 36.0–46.0)
Hemoglobin: 9.9 g/dL — ABNORMAL LOW (ref 12.0–15.0)
Hemoglobin: 10.4 g/dL — ABNORMAL LOW (ref 12.0–15.0)
MCH: 28.9 pg (ref 26.0–34.0)
MCH: 29.6 pg (ref 26.0–34.0)
MCHC: 33.8 g/dL (ref 30.0–36.0)
MCHC: 33.8 g/dL (ref 30.0–36.0)
MCV: 85.4 fL (ref 78.0–100.0)
MCV: 87.7 fL (ref 78.0–100.0)
PLATELETS: 317 10*3/uL (ref 150–400)
PLATELETS: 312 10*3/uL (ref 150–400)
RBC: 3.43 MIL/uL — AB (ref 3.87–5.11)
RBC: 3.51 MIL/uL — AB (ref 3.87–5.11)
RDW: 12.8 % (ref 11.5–15.5)
RDW: 13.1 % (ref 11.5–15.5)
WBC: 7.7 10*3/uL (ref 4.0–10.5)
WBC: 8.1 10*3/uL (ref 4.0–10.5)

## 2015-05-14 LAB — CREATININE, SERUM: CREATININE: 0.69 mg/dL (ref 0.44–1.00)

## 2015-05-14 LAB — HEMOGLOBIN A1C
Hgb A1c MFr Bld: 10.8 % — ABNORMAL HIGH (ref 4.8–5.6)
MEAN PLASMA GLUCOSE: 263 mg/dL

## 2015-05-14 LAB — GLUCOSE, CAPILLARY
GLUCOSE-CAPILLARY: 64 mg/dL — AB (ref 65–99)
Glucose-Capillary: 169 mg/dL — ABNORMAL HIGH (ref 65–99)
Glucose-Capillary: 193 mg/dL — ABNORMAL HIGH (ref 65–99)
Glucose-Capillary: 116 mg/dL — ABNORMAL HIGH (ref 65–99)

## 2015-05-14 LAB — HEPARIN LEVEL (UNFRACTIONATED)
HEPARIN UNFRACTIONATED: 0.26 [IU]/mL — AB (ref 0.30–0.70)
Heparin Unfractionated: 0.44 IU/mL (ref 0.30–0.70)

## 2015-05-14 LAB — FOLATE RBC
FOLATE, HEMOLYSATE: 404.7 ng/mL
Folate, RBC: 1273 ng/mL (ref >498)
Hematocrit: 31.8 % — ABNORMAL LOW (ref 34.0–46.6)

## 2015-05-14 SURGERY — LEFT HEART CATH AND CORONARY ANGIOGRAPHY

## 2015-05-14 MED ORDER — MIDAZOLAM HCL 2 MG/2ML IJ SOLN
INTRAMUSCULAR | Status: DC | PRN
  Administered 2015-05-14: 1 mg via INTRAVENOUS

## 2015-05-14 MED ORDER — NITROGLYCERIN 1 MG/10 ML FOR IR/CATH LAB
INTRA_ARTERIAL | Status: AC
  Filled 2015-05-14: qty 10

## 2015-05-14 MED ORDER — VERAPAMIL HCL 2.5 MG/ML IV SOLN
INTRAVENOUS | Status: AC
  Filled 2015-05-14: qty 2

## 2015-05-14 MED ORDER — SODIUM CHLORIDE 0.9 % IV SOLN: 250.0000 mL | INTRAVENOUS | Status: DC | PRN

## 2015-05-14 MED ORDER — SODIUM CHLORIDE 0.9 % IJ SOLN
3.0000 mL | Freq: Two times a day (BID) | INTRAMUSCULAR | Status: DC
  Administered 2015-05-14: 3 mL via INTRAVENOUS

## 2015-05-14 MED ORDER — SODIUM CHLORIDE 0.9 % IJ SOLN: 3.0000 mL | INTRAMUSCULAR | Status: DC | PRN

## 2015-05-14 MED ORDER — SODIUM CHLORIDE 0.9 % WEIGHT BASED INFUSION: 3.0000 mL/kg/h | INTRAVENOUS | Status: AC

## 2015-05-14 MED ORDER — MIDAZOLAM HCL 2 MG/2ML IJ SOLN
INTRAMUSCULAR | Status: AC
  Filled 2015-05-14: qty 4

## 2015-05-14 MED ORDER — HEPARIN (PORCINE) IN NACL 2-0.9 UNIT/ML-% IJ SOLN
INTRAMUSCULAR | Status: AC
  Filled 2015-05-14: qty 1000

## 2015-05-14 MED ORDER — MORPHINE SULFATE (PF) 2 MG/ML IV SOLN: 1.0000 mg | INTRAVENOUS | Status: DC | PRN

## 2015-05-14 MED ORDER — SODIUM CHLORIDE 0.9 % IJ SOLN
3.0000 mL | Freq: Two times a day (BID) | INTRAMUSCULAR | Status: DC
  Administered 2015-05-14 – 2015-05-16 (×3): 3 mL via INTRAVENOUS

## 2015-05-14 MED ORDER — RADIAL COCKTAIL (HEPARIN/VERAPAMIL/LIDOCAINE/NITRO)
Status: DC | PRN
  Administered 2015-05-14: 10 mL via INTRA_ARTERIAL

## 2015-05-14 MED ORDER — ASPIRIN 81 MG PO CHEW
81.0000 mg | CHEWABLE_TABLET | ORAL | Status: AC
  Administered 2015-05-14: 81 mg via ORAL
  Filled 2015-05-14: qty 1

## 2015-05-14 MED ORDER — NITROGLYCERIN 1 MG/10 ML FOR IR/CATH LAB
INTRA_ARTERIAL | Status: DC | PRN
  Administered 2015-05-14: 200 ug via INTRACORONARY

## 2015-05-14 MED ORDER — HEPARIN SODIUM (PORCINE) 1000 UNIT/ML IJ SOLN
INTRAMUSCULAR | Status: AC
Start: 2015-05-14 — End: 2015-05-14
  Filled 2015-05-14: qty 1

## 2015-05-14 MED ORDER — SODIUM CHLORIDE 0.9 % WEIGHT BASED INFUSION: 3.0000 mL/kg/h | INTRAVENOUS | Status: DC

## 2015-05-14 MED ORDER — IOHEXOL 350 MG/ML SOLN
INTRAVENOUS | Status: DC | PRN
  Administered 2015-05-14: 95 mL via INTRA_ARTERIAL

## 2015-05-14 MED ORDER — FENTANYL CITRATE (PF) 100 MCG/2ML IJ SOLN
INTRAMUSCULAR | Status: AC
  Filled 2015-05-14: qty 4

## 2015-05-14 MED ORDER — FENTANYL CITRATE (PF) 100 MCG/2ML IJ SOLN
INTRAMUSCULAR | Status: DC | PRN
  Administered 2015-05-14: 25 ug via INTRAVENOUS

## 2015-05-14 MED ORDER — CARVEDILOL 6.25 MG PO TABS
6.2500 mg | ORAL_TABLET | Freq: Two times a day (BID) | ORAL | Status: DC
  Administered 2015-05-14 – 2015-05-16 (×4): 6.25 mg via ORAL
  Filled 2015-05-14 (×4): qty 1

## 2015-05-14 MED ORDER — ENOXAPARIN SODIUM 40 MG/0.4ML ~~LOC~~ SOLN
40.0000 mg | SUBCUTANEOUS | Status: DC
  Administered 2015-05-15 – 2015-05-16 (×2): 40 mg via SUBCUTANEOUS
  Filled 2015-05-14 (×2): qty 0.4

## 2015-05-14 MED ORDER — HEPARIN SODIUM (PORCINE) 1000 UNIT/ML IJ SOLN
INTRAMUSCULAR | Status: DC | PRN
  Administered 2015-05-14: 4000 [IU] via INTRAVENOUS

## 2015-05-14 MED ORDER — LIDOCAINE HCL (PF) 1 % IJ SOLN
INTRAMUSCULAR | Status: AC
  Filled 2015-05-14: qty 30

## 2015-05-14 MED ORDER — SODIUM CHLORIDE 0.9 % WEIGHT BASED INFUSION: 1.0000 mL/kg/h | INTRAVENOUS | Status: DC

## 2015-05-14 SURGICAL SUPPLY — 11 items
CATH INFINITI 5 FR JL3.5 (CATHETERS) ×2 IMPLANT
CATH LAUNCHER 5F EBU3.0 (CATHETERS) IMPLANT
CATH OPTITORQUE TIG 4.0 5F (CATHETERS) ×3 IMPLANT
CATHETER LAUNCHER 5F EBU3.0 (CATHETERS) ×3
DEVICE RAD COMP TR BAND LRG (VASCULAR PRODUCTS) ×3 IMPLANT
GLIDESHEATH SLEND A-KIT 6F 22G (SHEATH) ×3 IMPLANT
KIT HEART LEFT (KITS) ×3 IMPLANT
PACK CARDIAC CATHETERIZATION (CUSTOM PROCEDURE TRAY) ×3 IMPLANT
TRANSDUCER W/STOPCOCK (MISCELLANEOUS) ×3 IMPLANT
TUBING CIL FLEX 10 FLL-RA (TUBING) ×3 IMPLANT
WIRE SAFE-T 1.5MM-J .035X260CM (WIRE) ×3 IMPLANT

## 2015-05-14 NOTE — Progress Notes (Signed)
Consent obtained using telephonic interpreting. Pt verbalized understanding. Pt stated she had no questions.   Sandrea Hammond RN

## 2015-05-14 NOTE — Progress Notes (Signed)
Subjective:  No chest pain, no N/V.   Objective:  Vital Signs in the last 24 hours: Temp:  [98 F (36.7 C)-98.5 F (36.9 C)] 98 F (36.7 C) (08/23 0981) Pulse Rate:  [88-92] 88 (08/23 0652) Resp:  [18] 18 (08/23 0652) BP: (140-149)/(69-77) 140/73 mmHg (08/23 0652) SpO2:  [100 %] 100 % (08/23 0652) Weight:  [161 lb 9.6 oz (73.3 kg)] 161 lb 9.6 oz (73.3 kg) (08/23 0652)  Intake/Output from previous day:  Intake/Output Summary (Last 24 hours) at 05/14/15 0923 Last data filed at 05/14/15 0815  Gross per 24 hour  Intake    480 ml  Output   1600 ml  Net  -1120 ml    Physical Exam: General appearance: alert, cooperative and no distress Neck: no JVD Lungs: clear to auscultation bilaterally Heart: regular rate and rhythm Extremities: no edema Pulses: 2+ and symmetric Skin: Skin color, texture, turgor normal. No rashes or lesions Neurologic: Grossly normal   Rate: 88  Rhythm: normal sinus rhythm  Lab Results:  Recent Labs  05/13/15 0731 05/14/15 0109  WBC 7.3 7.7  HGB 11.0* 9.9*  PLT 315 317    Recent Labs  05/12/15 2131 05/13/15 0731  NA 133* 135  K 4.1 4.0  CL 100* 103  CO2 21* 22  GLUCOSE 261* 323*  BUN 36* 30*  CREATININE 1.98* 1.23*    Recent Labs  05/13/15 1229 05/13/15 1855  TROPONINI 0.09* 0.10*    Recent Labs  05/13/15 0731  INR 1.08    Scheduled Meds: . aspirin EC  81 mg Oral Daily  . atorvastatin  80 mg Oral q1800  . carvedilol  3.125 mg Oral BID WC  . gabapentin  1,200 mg Oral QHS  . insulin aspart  0-5 Units Subcutaneous QHS  . insulin aspart  0-9 Units Subcutaneous TID WC  . insulin aspart  6 Units Subcutaneous TID AC  . insulin detemir  42 Units Subcutaneous QHS  . sodium chloride  3 mL Intravenous Q12H   Continuous Infusions: . sodium chloride 75 mL/hr at 05/13/15 0546  . [START ON 05/15/2015] sodium chloride     Followed by  . [START ON 05/15/2015] sodium chloride    . heparin 1,050 Units/hr (05/14/15 0235)   PRN  Meds:.sodium chloride, acetaminophen, nitroGLYCERIN, ondansetron (ZOFRAN) IV, sodium chloride   Imaging: No CXR done  Cardiac Studies: 05/13/15-Echo Study Conclusions  - Left ventricle: The cavity size was normal. Wall thickness was increased in a pattern of mild LVH. Systolic function was normal. The estimated ejection fraction was in the range of 60% to 65%. Wall motion was normal; there were no regional wall motion abnormalities. Doppler parameters are consistent with abnormal left ventricular relaxation (grade 1 diastolic dysfunction).   Assessment/Plan:   Principal Problem:   Syncope Active Problems:   Acute kidney injury   DM (diabetes mellitus), type 2, uncontrolled   Troponin level elevated   Essential hypertension   PLAN: Metformin, Prinizide on hold - SCr 1.98 on admission- 1.23. yesterday. Pt is for cath today. Consider CXR before discharge.   Corine Shelter PA-C 05/14/2015, 9:23 AM [phone]  The patient was seen, examined and discussed with Corine Shelter, PA-C and I agree with the above.   49 year old female with risk factors - DM, HTN, typical chest pain, new ECG changes and elevated troponin,  on a cath schedule for today as the schedule was full yesterday. Crea has improved 1.98 --> 1.23. Add heparin, continue ASA, atorvastatin,  increase carvedilol 6.25 mg po BID.  Lars Masson 05/14/2015

## 2015-05-14 NOTE — Progress Notes (Signed)
ANTICOAGULATION CONSULT NOTE  Pharmacy Consult for heparin Indication: chest pain/ACS  Allergies  Allergen Reactions  . No Known Allergies     Patient Measurements: Height:  (157.5 cm) Weight: 160 lb 14.4 oz (72.984 kg) IBW/kg (Calculated) : 50.1 Heparin Dosing Weight: 65.7kg  Vital Signs: Temp: 98.4 F (36.9 C) (08/22 2042) Temp Source: Oral (08/22 2042) BP: 144/77 mmHg (08/22 2042) Pulse Rate: 91 (08/22 2042)  Labs:  Recent Labs  05/12/15 2131 05/13/15 0731 05/13/15 1229 05/13/15 1630 05/13/15 1855 05/14/15 0109  HGB 10.0* 11.0*  --   --   --  9.9*  HCT 29.6* 32.4*  --   --   --  29.3*  PLT 327 315  --   --   --  317  APTT  --  30  --   --   --   --   LABPROT  --  14.2  --   --   --   --   INR  --  1.08  --   --   --   --   HEPARINUNFRC  --   --   --  0.31  --  0.26*  CREATININE 1.98* 1.23*  --   --   --   --   TROPONINI  --  0.10*  0.11*  0.10* 0.09*  --  0.10*  --     Estimated Creatinine Clearance: 51.8 mL/min (by C-G formula based on Cr of 1.23).  Assessment: 49 y.o. female with chest pain for heparin   Goal of Therapy:  Heparin level 0.3-0.7 units/ml Monitor platelets by anticoagulation protocol: Yes   Plan:  Increase Heparin 1050 units/hr F/U after cath  Geannie Risen, PharmD, BCPS

## 2015-05-14 NOTE — Interval H&P Note (Signed)
History and Physical Interval Note:  05/14/2015 10:49 AM  Rebecca [name]  has presented today for surgery, with the diagnosis of cp with + Troponin (Concerning for NSTEMI).   The various methods of treatment have been discussed with the patient and family. After consideration of risks, benefits and other options for treatment, the patient has consented to  Procedure(s): Left Heart Cath and Coronary Angiography (N/A) with possible PCI as a surgical intervention .  The patient's history has been reviewed, patient examined, no change in status, stable for surgery.  I have reviewed the patient's chart and labs.  Questions were answered to the patient's satisfaction.     HARDING, DAVID W  Cath Lab Visit (complete for each Cath Lab visit)  Clinical Evaluation Leading to the Procedure:   ACS: Yes.    Non-ACS:    Anginal Classification: CCS III  Anti-ischemic medical therapy: Minimal Therapy (1 class of medications)  Non-Invasive Test Results: No non-invasive testing performed  Prior CABG: No previous CABG    TIMI Score  Patient Information:  TIMI Score is 2  UA/NSTEMI and low-risk features (e.g., TIMI score  Revascularization of the presumed culprit artery   U (6)  Indication: 9; Score: 6  HARDING, Piedad Climes, M.D., M.S. Interventional Cardiologist   Pager # [phone]

## 2015-05-14 NOTE — H&P (View-Only) (Signed)
Subjective:  No chest pain, no N/V.   Objective:  Vital Signs in the last 24 hours: Temp:  [98 F (36.7 C)-98.5 F (36.9 C)] 98 F (36.7 C) (08/23 0981) Pulse Rate:  [88-92] 88 (08/23 0652) Resp:  [18] 18 (08/23 0652) BP: (140-149)/(69-77) 140/73 mmHg (08/23 0652) SpO2:  [100 %] 100 % (08/23 0652) Weight:  [161 lb 9.6 oz (73.3 kg)] 161 lb 9.6 oz (73.3 kg) (08/23 0652)  Intake/Output from previous day:  Intake/Output Summary (Last 24 hours) at 05/14/15 0923 Last data filed at 05/14/15 0815  Gross per 24 hour  Intake    480 ml  Output   1600 ml  Net  -1120 ml    Physical Exam: General appearance: alert, cooperative and no distress Neck: no JVD Lungs: clear to auscultation bilaterally Heart: regular rate and rhythm Extremities: no edema Pulses: 2+ and symmetric Skin: Skin color, texture, turgor normal. No rashes or lesions Neurologic: Grossly normal   Rate: 88  Rhythm: normal sinus rhythm  Lab Results:  Recent Labs  05/13/15 0731 05/14/15 0109  WBC 7.3 7.7  HGB 11.0* 9.9*  PLT 315 317    Recent Labs  05/12/15 2131 05/13/15 0731  NA 133* 135  K 4.1 4.0  CL 100* 103  CO2 21* 22  GLUCOSE 261* 323*  BUN 36* 30*  CREATININE 1.98* 1.23*    Recent Labs  05/13/15 1229 05/13/15 1855  TROPONINI 0.09* 0.10*    Recent Labs  05/13/15 0731  INR 1.08    Scheduled Meds: . aspirin EC  81 mg Oral Daily  . atorvastatin  80 mg Oral q1800  . carvedilol  3.125 mg Oral BID WC  . gabapentin  1,200 mg Oral QHS  . insulin aspart  0-5 Units Subcutaneous QHS  . insulin aspart  0-9 Units Subcutaneous TID WC  . insulin aspart  6 Units Subcutaneous TID AC  . insulin detemir  42 Units Subcutaneous QHS  . sodium chloride  3 mL Intravenous Q12H   Continuous Infusions: . sodium chloride 75 mL/hr at 05/13/15 0546  . [START ON 05/15/2015] sodium chloride     Followed by  . [START ON 05/15/2015] sodium chloride    . heparin 1,050 Units/hr (05/14/15 0235)   PRN  Meds:.sodium chloride, acetaminophen, nitroGLYCERIN, ondansetron (ZOFRAN) IV, sodium chloride   Imaging: No CXR done  Cardiac Studies: 05/13/15-Echo Study Conclusions  - Left ventricle: The cavity size was normal. Wall thickness was increased in a pattern of mild LVH. Systolic function was normal. The estimated ejection fraction was in the range of 60% to 65%. Wall motion was normal; there were no regional wall motion abnormalities. Doppler parameters are consistent with abnormal left ventricular relaxation (grade 1 diastolic dysfunction).   Assessment/Plan:   Principal Problem:   Syncope Active Problems:   Acute kidney injury   DM (diabetes mellitus), type 2, uncontrolled   Troponin level elevated   Essential hypertension   PLAN: Metformin, Prinizide on hold - SCr 1.98 on admission- 1.23. yesterday. Pt is for cath today. Consider CXR before discharge.   Corine Shelter PA-C 05/14/2015, 9:23 AM [phone]  The patient was seen, examined and discussed with Corine Shelter, PA-C and I agree with the above.   49 year old female with risk factors - DM, HTN, typical chest pain, new ECG changes and elevated troponin,  on a cath schedule for today as the schedule was full yesterday. Crea has improved 1.98 --> 1.23. Add heparin, continue ASA, atorvastatin,  increase carvedilol 6.25 mg po BID.  Lars Masson 05/14/2015

## 2015-05-15 DIAGNOSIS — I10 Essential (primary) hypertension: Secondary | ICD-10-CM

## 2015-05-15 DIAGNOSIS — I2510 Atherosclerotic heart disease of native coronary artery without angina pectoris: Secondary | ICD-10-CM

## 2015-05-15 DIAGNOSIS — R55 Syncope and collapse: Secondary | ICD-10-CM

## 2015-05-15 LAB — CBC
HCT: 29.1 % — ABNORMAL LOW (ref 36.0–46.0)
Hemoglobin: 9.8 g/dL — ABNORMAL LOW (ref 12.0–15.0)
MCH: 29.1 pg (ref 26.0–34.0)
MCHC: 33.7 g/dL (ref 30.0–36.0)
MCV: 86.4 fL (ref 78.0–100.0)
PLATELETS: 325 10*3/uL (ref 150–400)
RBC: 3.37 MIL/uL — ABNORMAL LOW (ref 3.87–5.11)
RDW: 12.9 % (ref 11.5–15.5)
WBC: 7.5 10*3/uL (ref 4.0–10.5)

## 2015-05-15 LAB — GLUCOSE, CAPILLARY
GLUCOSE-CAPILLARY: 121 mg/dL — AB (ref 65–99)
GLUCOSE-CAPILLARY: 200 mg/dL — AB (ref 65–99)
Glucose-Capillary: 96 mg/dL (ref 65–99)
Glucose-Capillary: 134 mg/dL — ABNORMAL HIGH (ref 65–99)

## 2015-05-15 MED FILL — Lidocaine HCl Local Preservative Free (PF) Inj 1%: INTRAMUSCULAR | Qty: 30 | Status: AC

## 2015-05-15 MED FILL — Nitroglycerin IV Soln 100 MCG/ML in D5W: INTRA_ARTERIAL | Qty: 10 | Status: AC

## 2015-05-15 MED FILL — Heparin Sodium (Porcine) 2 Unit/ML in Sodium Chloride 0.9%: INTRAMUSCULAR | Qty: 1000 | Status: AC

## 2015-05-15 NOTE — Progress Notes (Signed)
CARDIAC REHAB PHASE I   PRE:  Rate/Rhythm: 94 SR    BP: sitting 140/70    SaO2:   MODE:  Ambulation: 500 ft   POST:  Rate/Rhythm: 101 ST    BP: sitting 120/70     SaO2:   Tolerated well, feels good. No CP or SOB. Needs translator for education. RN will set up. I set up videos and gave pt papers on DM diet and HH diet. [id number]   Rebecca [name] Cache CES, ACSM 05/15/2015 10:59 AM

## 2015-05-15 NOTE — Progress Notes (Signed)
Subjective:  No chest pain, no N/V.   Objective:  Vital Signs in the last 24 hours: Temp:  [97.4 F (36.3 C)-98.2 F (36.8 C)] 98.2 F (36.8 C) (08/24 0431) Pulse Rate:  [77-89] 89 (08/24 0431) Resp:  [18] 18 (08/24 0431) BP: (133-153)/(71) 133/71 mmHg (08/24 0431) SpO2:  [98 %-100 %] 99 % (08/24 0431)  Intake/Output from previous day:  Intake/Output Summary (Last 24 hours) at 05/15/15 1204 Last data filed at 05/15/15 0900  Gross per 24 hour  Intake    240 ml  Output      0 ml  Net    240 ml   Physical Exam: General appearance: alert, cooperative and no distress Neck: no JVD Lungs: clear to auscultation bilaterally Heart: regular rate and rhythm Extremities: no edema Pulses: 2+ and symmetric Skin: Skin color, texture, turgor normal. No rashes or lesions Neurologic: Grossly normal   Rate: 88  Rhythm: normal sinus rhythm  Lab Results:  Recent Labs  05/14/15 1315 05/15/15 0436  WBC 8.1 7.5  HGB 10.4* 9.8*  PLT 312 325    Recent Labs  05/12/15 2131 05/13/15 0731 05/14/15 1315  NA 133* 135  --   K 4.1 4.0  --   CL 100* 103  --   CO2 21* 22  --   GLUCOSE 261* 323*  --   BUN 36* 30*  --   CREATININE 1.98* 1.23* 0.69    Recent Labs  05/13/15 1229 05/13/15 1855  TROPONINI 0.09* 0.10*    Recent Labs  05/13/15 0731  INR 1.08   Scheduled Meds: . aspirin EC  81 mg Oral Daily  . atorvastatin  80 mg Oral q1800  . carvedilol  6.25 mg Oral BID WC  . enoxaparin (LOVENOX) injection  40 mg Subcutaneous Q24H  . gabapentin  1,200 mg Oral QHS  . insulin aspart  0-5 Units Subcutaneous QHS  . insulin aspart  0-9 Units Subcutaneous TID WC  . insulin aspart  6 Units Subcutaneous TID AC  . insulin detemir  42 Units Subcutaneous QHS  . sodium chloride  3 mL Intravenous Q12H   Continuous Infusions: . sodium chloride 75 mL/hr at 05/13/15 0546   PRN Meds:.sodium chloride, acetaminophen, morphine injection, nitroGLYCERIN, ondansetron (ZOFRAN) IV, sodium  chloride  Imaging: No CXR done  Cardiac Studies: 05/13/15-Echo Study Conclusions  - Left ventricle: The cavity size was normal. Wall thickness was increased in a pattern of mild LVH. Systolic function was normal. The estimated ejection fraction was in the range of 60% to 65%. Wall motion was normal; there were no regional wall motion abnormalities. Doppler parameters are consistent with abnormal left ventricular relaxation (grade 1 diastolic dysfunction).   Assessment/Plan:   Principal Problem:   NSTEMI (non-ST elevated myocardial infarction) Active Problems:   Acute kidney injury   DM (diabetes mellitus), type 2, uncontrolled   Syncope   Essential hypertension   CAD- with distal LAD disease, ? 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   PLAN: Metformin, Prinizide on hold - SCr 1.98 on admission- 1.23. yesterday. Pt is for cath today. Consider CXR before discharge.   49 year old female with risk factors - DM, HTN, typical chest pain, new ECG changes and elevated troponin,  on a cath schedule for today as the schedule was full yesterday. Crea has improved 1.98 --> 1.23 --> 0.69. Add heparin, continue ASA, atorvastatin, increase carvedilol 6.25 mg po BID. Cath yesterday showed:   Likely potential culprit for the patient's positive troponin is related to  what appears to be a possible spontaneous coronary dissection in the distal LAD. This is in a small distal vessel distribution that is not PCI amenable.  Dist LAD-1 lesion, 60% stenosed - this appears to be the potential origin of what may be a spontaneous dissection leading to the distal 95% stenosis.  The left ventricular systolic function is normal. normal LVEDP  Continue aggressive cardiac risk factor modification - aspirin, statin, beta blocker plus or minus ACE inhibitor/ARB  We will add ACEI/ARB once crea stable. Anticipated discharge tomorrow.   Lars Masson 05/15/2015

## 2015-05-16 ENCOUNTER — Telehealth: Payer: Self-pay | Admitting: Physician Assistant

## 2015-05-16 ENCOUNTER — Other Ambulatory Visit: Payer: Self-pay | Admitting: Physician Assistant

## 2015-05-16 DIAGNOSIS — N179 Acute kidney failure, unspecified: Secondary | ICD-10-CM

## 2015-05-16 LAB — CBC
HEMATOCRIT: 27.8 % — AB (ref 36.0–46.0)
HEMOGLOBIN: 9.6 g/dL — AB (ref 12.0–15.0)
MCH: 29.9 pg (ref 26.0–34.0)
MCHC: 34.5 g/dL (ref 30.0–36.0)
MCV: 86.6 fL (ref 78.0–100.0)
Platelets: 302 10*3/uL (ref 150–400)
RBC: 3.21 MIL/uL — ABNORMAL LOW (ref 3.87–5.11)
RDW: 12.8 % (ref 11.5–15.5)
WBC: 7.3 10*3/uL (ref 4.0–10.5)

## 2015-05-16 LAB — GLUCOSE, CAPILLARY
GLUCOSE-CAPILLARY: 127 mg/dL — AB (ref 65–99)
Glucose-Capillary: 111 mg/dL — ABNORMAL HIGH (ref 65–99)

## 2015-05-16 MED ORDER — CARVEDILOL 6.25 MG PO TABS: 6.2500 mg | ORAL_TABLET | Freq: Two times a day (BID) | ORAL | Status: DC

## 2015-05-16 MED ORDER — ASPIRIN 81 MG PO TBEC: 81.0000 mg | DELAYED_RELEASE_TABLET | Freq: Every day | ORAL | Status: DC

## 2015-05-16 MED ORDER — HYDROCHLOROTHIAZIDE 12.5 MG PO CAPS
12.5000 mg | ORAL_CAPSULE | Freq: Every day | ORAL | Status: DC
  Administered 2015-05-16: 12.5 mg via ORAL
  Filled 2015-05-16: qty 1

## 2015-05-16 MED ORDER — NITROGLYCERIN 0.4 MG SL SUBL: 0.4000 mg | SUBLINGUAL_TABLET | SUBLINGUAL | Status: DC | PRN

## 2015-05-16 MED ORDER — LISINOPRIL 20 MG PO TABS
20.0000 mg | ORAL_TABLET | Freq: Every day | ORAL | Status: DC
  Administered 2015-05-16: 20 mg via ORAL
  Filled 2015-05-16: qty 1

## 2015-05-16 MED ORDER — ATORVASTATIN CALCIUM 80 MG PO TABS: 80.0000 mg | ORAL_TABLET | Freq: Every day | ORAL | Status: DC

## 2015-05-16 NOTE — Progress Notes (Signed)
Patient Name: Rebecca [name] Date of Encounter: 05/16/2015  Principal Problem:   NSTEMI (non-ST elevated myocardial infarction) Active Problems:   Acute kidney injury   DM (diabetes mellitus), type 2, uncontrolled   Syncope   Essential hypertension   CAD- with distal LAD disease, ? 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   Primary Cardiologist: New, Dr Delton See  Patient Profile: 49 year old female with risk factors - DM, HTN, typical chest pain, new ECG changes and elevated troponin, admitted 08/22, cath w/ med rx for dist LAD dissection. EF 60-65% by echo  SUBJECTIVE: Spoke w/ pt and family in room, family helped translate. No chest pain, no SOB. No insurance.  OBJECTIVE Filed Vitals:   05/15/15 0431 05/15/15 1500 05/15/15 2020 05/16/15 0530  BP: 133/71 152/70 148/82 160/80  Pulse: 89 82 88 80  Temp: 98.2 F (36.8 C) 97.9 F (36.6 C) 98.9 F (37.2 C) 98.2 F (36.8 C)  TempSrc: Oral Oral Oral Oral  Resp: 18 18 18 18   Height:      Weight:      SpO2: 99% 100% 100% 100%   No intake or output data in the 24 hours ending 05/16/15 0958 Filed Weights   05/12/15 2119 05/14/15 0652  Weight: 160 lb 14.4 oz (72.984 kg) 161 lb 9.6 oz (73.3 kg)    PHYSICAL EXAM General: Well developed, well nourished, female in no acute distress. Head: Normocephalic, atraumatic.  Neck: Supple without bruits, JVD not elevated. Lungs:  Resp regular and unlabored, CTA. Heart: RRR, S1, S2, no S3, S4, or murmur; no rub. Abdomen: Soft, non-tender, non-distended, BS + x 4.  Extremities: No clubbing, cyanosis, edema.  Neuro: Alert and oriented X 3. Moves all extremities spontaneously. Psych: Normal affect.  LABS: CBC: Recent Labs  05/15/15 0436 05/16/15 0443  WBC 7.5 7.3  HGB 9.8* 9.6*  HCT 29.1* 27.8*  MCV 86.4 86.6  PLT 325 302   Basic Metabolic Panel: Recent Labs  05/14/15 1315  CREATININE 0.69   Cardiac Enzymes: Recent Labs  05/13/15 1229 05/13/15 1855  TROPONINI 0.09* 0.10*   Lab  Results  Component Value Date   HGBA1C 10.8* 05/13/2015    TELE:  SR, no sig ectopy  Current Medications:  . aspirin EC  81 mg Oral Daily  . atorvastatin  80 mg Oral q1800  . carvedilol  6.25 mg Oral BID WC  . enoxaparin (LOVENOX) injection  40 mg Subcutaneous Q24H  . gabapentin  1,200 mg Oral QHS  . insulin aspart  0-5 Units Subcutaneous QHS  . insulin aspart  0-9 Units Subcutaneous TID WC  . insulin aspart  6 Units Subcutaneous TID AC  . insulin detemir  42 Units Subcutaneous QHS  . sodium chloride  3 mL Intravenous Q12H   . sodium chloride 75 mL/hr at 05/13/15 0546    ASSESSMENT AND PLAN: Principal Problem:   NSTEMI (non-ST elevated myocardial infarction) - cath w/ spont dissection dist LAD, o/w non-obs dz - on ASA, statin, BB.  - Cardiac rehab has seen, f/u as OP if possible  Active Problems:   Acute kidney injury - improved    DM (diabetes mellitus), type 2, uncontrolled - A1c elevated, on home dose insulins - has needed very little sliding scale - encourage med and dietary compliance - f/u with primary MD    Syncope - No sig arrhythmia    Essential hypertension - BP trending up - BB is new, HR will tolerate increased dose - PTA on lisinopril/HCTZ 20/12.5, ?resume  this or just increase BB    CAD- with distal LAD disease, ? 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 - see above  Plan - d/c today. Case mgr to see to make sure she can get meds.  Signed, Leanna Battles 9:58 AM 05/16/2015  The patient was seen, examined and discussed with Theodore Demark, PA-C and I agree with the above.    49 year old female with risk factors - DM, HTN, typical chest pain, new ECG changes and elevated troponin, on a cath schedule for today as the schedule was full yesterday. Crea has improved 1.98 --> 1.23 --> 0.69. Add heparin, continue ASA, atorvastatin, increase carvedilol 6.25 mg po BID. Cath yesterday showed:   Likely potential culprit for the patient's positive troponin is  related to what appears to be a possible spontaneous coronary dissection in the distal LAD. This is in a small distal vessel distribution that is not PCI amenable.  Dist LAD-1 lesion, 60% stenosed - this appears to be the potential origin of what may be a spontaneous dissection leading to the distal 95% stenosis.  The left ventricular systolic function is normal. normal LVEDP  Continue aggressive cardiac risk factor modification - aspirin, statin, beta blocker plus or minus ACE inhibitor/ARB  We will restart  lisinopril/HCTZ 20/12.5, recheck at the clinic including CMP.   Lars Masson 05/16/2015

## 2015-05-16 NOTE — Progress Notes (Signed)
Discharge instructions given. Pt verbalized understanding and all questions were answered.  

## 2015-05-16 NOTE — Progress Notes (Signed)
Interpreter Wyvonnia Dusky for RN Sahra, Care Management and DM teaching

## 2015-05-16 NOTE — Progress Notes (Signed)
Ed completed with pt, husband, and Nurse, learning disability. Voiced understanding of meds, NTG, ex, and diet. [phone] Ethelda Chick CES, ACSM 1:46 PM 05/16/2015

## 2015-05-16 NOTE — Care Management Note (Signed)
Case Management Note Donn Pierini RN, BSN Unit 2W-Case Manager [phone]  Patient Details  Name: Rebecca [name] MRN: [id number] Date of Birth: 1966-09-15  Subjective/Objective:  Pt admitted with NSTEMI                  Action/Plan: PTA pt lived at home with spouse- independent-  Expected Discharge Date:      05/16/15            Expected Discharge Plan:  Home/Self Care  In-House Referral:     Discharge planning Services  CM Consult, Medication Assistance  Post Acute Care Choice:    Choice offered to:     DME Arranged:    DME Agency:     HH Arranged:    HH Agency:     Status of Service:  Completed, signed off  Medicare Important Message Given:    Date Medicare IM Given:    Medicare IM give by:    Date Additional Medicare IM Given:    Additional Medicare Important Message give by:     If discussed at Long Length of Stay Meetings, dates discussed:    Additional Comments:  05/16/15- referral received for medication needs- spoke with pt via interpreter Darrelyn Hillock) at the bedside- per pt she has meds at home that she was already on- her PCP is at the [facility]- [facility]. At times has difficulty affording medications. Reviewed new medications with pt and estimated cost at [facility] about $45- via interpreter- pt reports that she will be able to afford this cost- [facility] can assist with any of the new meds- and if not then pt will go to [facility]- bedside RN to page PA regarding need for hard scripts for pt to take with her. Pt also given coupons for [employer] and Lipitor to use at [place] if needed.  Via interpreter pt states no other needs.   Darrold Span, RN 05/16/2015, 2:34 PM

## 2015-05-16 NOTE — Discharge Summary (Signed)
CARDIOLOGY DISCHARGE SUMMARY   Patient ID: Rebecca [name] MRN: [id number] DOB/AGE: [birth year] 49 y.o.  Admit date: 05/13/2015 Discharge date: 05/16/2015  PCP: [facility] Primary Cardiologist:  Dr. Delton See  Primary Discharge Diagnosis:  Non-STEMI, medical therapy for distal LAD dissection Secondary Discharge Diagnosis:    Acute kidney injury   DM (diabetes mellitus), type 2, uncontrolled   Syncope   Essential hypertension   CAD- with distal LAD disease, ? 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   Anemia   Procedures: Cardiac catheterization, coronary arteriogram, left ventriculogram, 2-D echocardiogram  Hospital Course: Rebecca [name] is a 49 y.o. female with a history of diabetes, hypertension, but no CAD. She had typical chest pain and came to the emergency room where her ECG so some ischemic changes and her troponin was elevated. She was admitted for further evaluation and treatment.  Her cardiac enzymes remained elevated indicating a non-STEMI. She was pain-free on aspirin and heparin. She was started on a high-dose statin and beta blocker. A 2-D echocardiogram was performed, showing preserved EF with no wall motion abnormalities, but she had grade 1 diastolic dysfunction.  She was taken to the cath lab on 05/14/2015. Cardiac catheterization results are below. She had no critical lesions, but the culprit lesion was felt to be a distal LAD dissection. This is a small vessel and not amenable to percutaneous intervention. Medical therapy was recommended.  She has a history of diabetes and was continued on her home insulin. She was on sliding scale insulin but required very little of it. A hemoglobin A1c was performed and was elevated greater than 10. Compliance with a diabetic diet and her medications is encouraged and she is to follow-up with primary care.   A lipid profile as well as LFTs were performed. Her LFTs were within normal limits. Her lipid profile showed a  low HDL and she is to continue atorvastatin, but increased to a high dose.   She was seen by cardiac rehabilitation and ambulated well. She will need a Nurse, learning disability for education.  She had some renal insufficiency after the cath but was hydrated and this improved.  Her beta blocker was increased for better blood pressure control. She tolerated the medication changes well.  On 05/16/2015, she was seen by Dr. Delton See and all data were reviewed. It was felt that her blood pressure would tolerate the addition of her home medication, lisinopril HCTZ as well as the beta blocker she is currently taking. She will need early follow-up in the office with a recheck of her electrolytes and renal function. She was ambulating without chest pain or shortness of breath. No further inpatient workup is indicated and she is considered stable for discharge, to follow up as an outpatient   Labs:   Lab Results  Component Value Date   WBC 7.3 05/16/2015   HGB 9.6* 05/16/2015   HCT 27.8* 05/16/2015   MCV 86.6 05/16/2015   PLT 302 05/16/2015     Recent Labs Lab 05/13/15 0731 05/14/15 1315  NA 135  --   K 4.0  --   CL 103  --   CO2 22  --   BUN 30*  --   CREATININE 1.23* 0.69  CALCIUM 8.7*  --   PROT 6.7  --   BILITOT 0.6  --   ALKPHOS 91  --   ALT 23  --   AST 21  --   GLUCOSE 323*  --     Recent Labs  05/13/15 1229 05/13/15 1855  TROPONINI 0.09* 0.10*   Lipid Panel     Component Value Date/Time   CHOL 143 05/13/2015 0731   TRIG 117 05/13/2015 0731   HDL 35* 05/13/2015 0731   CHOLHDL 4.1 05/13/2015 0731   VLDL 23 05/13/2015 0731   LDLCALC 85 05/13/2015 0731   Lab Results  Component Value Date   HGBA1C 10.8* 05/13/2015    Cardiac Cath: 05/14/2015  Likely potential culprit for the patient's positive troponin is related to what appears to be a possible spontaneous coronary dissection in the distal LAD. This is in a small distal vessel distribution that is not PCI amenable.  Dist  LAD-1 lesion, 60% stenosed - this appears to be the potential origin of what may be a spontaneous dissection leading to the distal 95% stenosis.  The left ventricular systolic function is normal. normal LVEDP Severe diffuse distal LAD disease that appears to be either spontaneous coronary dissection versus diffuse atherosclerotic disease. The severe stenosis is very downstream in the LAD and not PCI amenable. Recommendation is for medical management. Positive troponin could be related to spontaneous coronary event versus potential demand ischemia if this is an existing lesion. Plan:   Standard post radial cath care with TR band removal  discontinue heparin infusion  Continue aggressive cardiac risk factor modification - aspirin, statin, beta blocker plus or minus ACE inhibitor/ARB  EKG: 05/14/2015 Sinus rhythm, no acute ischemic changes  Echo: 05/13/2015 Conclusions - Left ventricle: The cavity size was normal. Wall thickness was increased in a pattern of mild LVH. Systolic function was normal. The estimated ejection fraction was in the range of 60% to 65%. Wall motion was normal; there were no regional wall motion abnormalities. Doppler parameters are consistent with abnormal left ventricular relaxation (grade 1 diastolic dysfunction).  FOLLOW UP PLANS AND APPOINTMENTS Allergies  Allergen Reactions  . No Known Allergies      Medication List    TAKE these medications        aspirin 81 MG EC tablet  Take 1 tablet (81 mg total) by mouth daily.     atorvastatin 80 MG tablet  Commonly known as:  LIPITOR  Take 1 tablet (80 mg total) by mouth daily at 6 PM.     carvedilol 6.25 MG tablet  Commonly known as:  COREG  Take 1 tablet (6.25 mg total) by mouth 2 (two) times daily with a meal.     gabapentin 600 MG tablet  Commonly known as:  NEURONTIN  Take 2 tablets (1,200 mg total) by mouth at bedtime.     insulin aspart 100 UNIT/ML injection  Commonly known as:   NOVOLOG  Inject 6 Units into the skin 3 (three) times daily before meals.     insulin detemir 100 UNIT/ML injection  Commonly known as:  LEVEMIR  Inject 0.42 mLs (42 Units total) into the skin at bedtime.     lisinopril-hydrochlorothiazide 20-12.5 MG per tablet  Commonly known as:  PRINZIDE,ZESTORETIC  Take 1 tablet by mouth daily. For blood pressure     metFORMIN 1000 MG (MOD) 24 hr tablet  Commonly known as:  GLUMETZA  Take 1,000 mg by mouth 2 (two) times daily with a meal.     nitroGLYCERIN 0.4 MG SL tablet  Commonly known as:  NITROSTAT  Place 1 tablet (0.4 mg total) under the tongue every 5 (five) minutes as needed for chest pain.         Follow-up Information    Follow up  with Theodore Demark, PA-C On 05/22/2015.   Specialties:  Cardiology, Radiology   Why:  See Provider for Dr Delton See at 8:30 am, please arrive 15 minutes early for paperwork.   Contact information:   [address] [place] [address] [phone]       BRING ALL MEDICATIONS WITH YOU TO FOLLOW UP APPOINTMENTS  Time spent with patient to include physician time: 43 min Signed: Theodore Demark, PA-C 05/16/2015, 12:19 PM Co-Sign MD

## 2015-05-16 NOTE — Telephone Encounter (Signed)
New problem    TCM 8.31.16 w/Rhonda per Bjorn Loser.

## 2015-05-17 NOTE — Telephone Encounter (Signed)
Left message to call back  

## 2015-05-20 NOTE — Telephone Encounter (Signed)
LMTCB through interpreter [phone], pt speaks spanish, do not see DPR on file.

## 2015-05-20 NOTE — Telephone Encounter (Signed)
LMTCB through interpreter [phone]

## 2015-05-22 ENCOUNTER — Ambulatory Visit (INDEPENDENT_AMBULATORY_CARE_PROVIDER_SITE_OTHER): Payer: Self-pay | Admitting: Physician Assistant

## 2015-05-22 ENCOUNTER — Other Ambulatory Visit: Payer: Self-pay

## 2015-05-22 ENCOUNTER — Encounter: Payer: Self-pay | Admitting: Physician Assistant

## 2015-05-22 DIAGNOSIS — I214 Non-ST elevation (NSTEMI) myocardial infarction: Secondary | ICD-10-CM

## 2015-05-22 NOTE — Telephone Encounter (Signed)
Pt was seen in this office by Theodore Demark PA today 05/22/15 as post- hospital O/V.

## 2015-05-22 NOTE — Progress Notes (Signed)
No show

## 2015-06-24 ENCOUNTER — Other Ambulatory Visit: Payer: Self-pay

## 2015-06-24 ENCOUNTER — Encounter: Payer: No Typology Code available for payment source | Admitting: Physician Assistant

## 2015-07-04 ENCOUNTER — Encounter: Payer: Self-pay | Admitting: Physician Assistant

## 2015-07-24 ENCOUNTER — Ambulatory Visit: Payer: No Typology Code available for payment source | Admitting: Nurse Practitioner

## 2015-07-24 ENCOUNTER — Other Ambulatory Visit: Payer: Self-pay

## 2015-07-26 ENCOUNTER — Other Ambulatory Visit: Payer: Self-pay

## 2015-07-26 ENCOUNTER — Ambulatory Visit: Payer: No Typology Code available for payment source | Admitting: Cardiology

## 2015-07-30 ENCOUNTER — Other Ambulatory Visit: Payer: No Typology Code available for payment source

## 2015-07-30 ENCOUNTER — Encounter: Payer: Self-pay | Admitting: Cardiology

## 2015-07-30 ENCOUNTER — Ambulatory Visit (INDEPENDENT_AMBULATORY_CARE_PROVIDER_SITE_OTHER): Payer: No Typology Code available for payment source | Admitting: Cardiology

## 2015-07-30 VITALS — BP 150/82 | HR 84 | Ht 60.0 in | Wt 159.0 lb

## 2015-07-30 DIAGNOSIS — I214 Non-ST elevation (NSTEMI) myocardial infarction: Secondary | ICD-10-CM

## 2015-07-30 DIAGNOSIS — I2510 Atherosclerotic heart disease of native coronary artery without angina pectoris: Secondary | ICD-10-CM

## 2015-07-30 DIAGNOSIS — R5383 Other fatigue: Secondary | ICD-10-CM

## 2015-07-30 DIAGNOSIS — R42 Dizziness and giddiness: Secondary | ICD-10-CM

## 2015-07-30 DIAGNOSIS — IMO0002 Reserved for concepts with insufficient information to code with codable children: Secondary | ICD-10-CM

## 2015-07-30 DIAGNOSIS — R61 Generalized hyperhidrosis: Secondary | ICD-10-CM

## 2015-07-30 DIAGNOSIS — R000 Tachycardia, unspecified: Secondary | ICD-10-CM

## 2015-07-30 LAB — CBC WITH DIFFERENTIAL/PLATELET
BASOS PCT: 1 % (ref 0–1)
Basophils Absolute: 0.1 10*3/uL (ref 0.0–0.1)
EOS ABS: 0.3 10*3/uL (ref 0.0–0.7)
EOS PCT: 4 % (ref 0–5)
HCT: 32.2 % — ABNORMAL LOW (ref 36.0–46.0)
Hemoglobin: 11 g/dL — ABNORMAL LOW (ref 12.0–15.0)
LYMPHS ABS: 1.8 10*3/uL (ref 0.7–4.0)
Lymphocytes Relative: 26 % (ref 12–46)
MCH: 28.6 pg (ref 26.0–34.0)
MCHC: 34.2 g/dL (ref 30.0–36.0)
MCV: 83.6 fL (ref 78.0–100.0)
MONO ABS: 0.5 10*3/uL (ref 0.1–1.0)
MONOS PCT: 8 % (ref 3–12)
MPV: 9.5 fL (ref 8.6–12.4)
Neutro Abs: 4.1 10*3/uL (ref 1.7–7.7)
Neutrophils Relative %: 61 % (ref 43–77)
PLATELETS: 341 10*3/uL (ref 150–400)
RBC: 3.85 MIL/uL — ABNORMAL LOW (ref 3.87–5.11)
RDW: 12.6 % (ref 11.5–15.5)
WBC: 6.8 10*3/uL (ref 4.0–10.5)

## 2015-07-30 LAB — COMPREHENSIVE METABOLIC PANEL
ALK PHOS: 110 U/L (ref 33–115)
ALT: 24 U/L (ref 6–29)
AST: 23 U/L (ref 10–35)
Albumin: 3.8 g/dL (ref 3.6–5.1)
BILIRUBIN TOTAL: 0.5 mg/dL (ref 0.2–1.2)
BUN: 34 mg/dL — AB (ref 7–25)
CALCIUM: 9.6 mg/dL (ref 8.6–10.2)
CO2: 26 mmol/L (ref 20–31)
CREATININE: 1.13 mg/dL — AB (ref 0.50–1.10)
Chloride: 98 mmol/L (ref 98–110)
GLUCOSE: 326 mg/dL — AB (ref 65–99)
Potassium: 5.2 mmol/L (ref 3.5–5.3)
SODIUM: 135 mmol/L (ref 135–146)
Total Protein: 7.7 g/dL (ref 6.1–8.1)

## 2015-07-30 LAB — LIPID PANEL
CHOL/HDL RATIO: 4.5 ratio (ref <=5.0)
CHOLESTEROL: 190 mg/dL (ref 125–200)
HDL: 42 mg/dL — ABNORMAL LOW (ref >=46)
LDL Cholesterol: 122 mg/dL (ref <130)
Triglycerides: 130 mg/dL (ref <150)
VLDL: 26 mg/dL (ref <30)

## 2015-07-30 NOTE — Progress Notes (Signed)
Cardiology Office Note   Date:  07/30/2015   ID:  Rebecca [name], DOB 1966-07-05, MRN [id number]  PCP:  [facility]  Cardiologist:  Dr. Delton See    Chief Complaint  Patient presents with  . Coronary Artery Disease      History of Present Illness: Rebecca [name] is a 49 y.o. female who presents for hospital follow up of MI with dissection of distal LAD.  She has a Hx DM, CKD- acute injury, syncope, and anemia. She was hospitalized 05/13/15 to 05/16/15-NSTEMI.  She had cardiac cath.    She had no critical lesions, but the culprit lesion was felt to be a distal LAD dissection. This is a small vessel and not amenable to percutaneous intervention. Medical therapy was recommended.  A 2-D echocardiogram was performed, showing preserved EF with no wall motion abnormalities, but she had grade 1 diastolic dysfunction.  Today with translator, she speaks Spanish, she has no chest pain, no SOB. Her complaint is dizziness at times.  Her BP today is elevated but she has not taken her home meds yet today.  She does check at [facility] at times and it runs 98 to 103 systolic.  Unsure if the dizziness is related to her BP.  She also complains of short bursts of fast HR with activity.  Other complaints are diaphoresis and fatigue.    She exercises by walking 30 min for about 5 days a week.  She tries to eat a healthy diet.  Past Medical History  Diagnosis Date  . Diabetes mellitus   . Hypertension   . Non-STEMI (non-ST elevated myocardial infarction) [name])     Medical therapy for distal LAD dissection  . Dyslipidemia   . Anemia     Past Surgical History  Procedure Laterality Date  . Cardiac catheterization N/A 05/14/2015    Procedure: Left Heart Cath and Coronary Angiography;  Surgeon: Marykay Lex, MD; dLAD 60%>>95% (small vessel, ?dissection), CFX & RCA systems no sig dz, EF & LVEDP nl        Current Outpatient Prescriptions  Medication Sig Dispense Refill  .  atorvastatin (LIPITOR) 80 MG tablet Take 1 tablet (80 mg total) by mouth daily at 6 PM. 30 tablet 11  . carvedilol (COREG) 6.25 MG tablet Take 1 tablet (6.25 mg total) by mouth 2 (two) times daily with a meal. 60 tablet 11  . gabapentin (NEURONTIN) 600 MG tablet Take 2 tablets (1,200 mg total) by mouth at bedtime. 30 tablet 1  . HYDROcodone-acetaminophen (NORCO/VICODIN) 5-325 MG per tablet take 1 tablet by mouth every 4-6 as needed for leg pain  0  . insulin aspart (NOVOLOG) 100 UNIT/ML injection Inject 6 Units into the skin 3 (three) times daily before meals. 10 mL 1  . insulin detemir (LEVEMIR) 100 UNIT/ML injection Inject 0.42 mLs (42 Units total) into the skin at bedtime. 10 mL 1  . lisinopril-hydrochlorothiazide (PRINZIDE,ZESTORETIC) 20-12.5 MG per tablet Take 1 tablet by mouth daily. For blood pressure 30 tablet 0  . metFORMIN (GLUMETZA) 1000 MG (MOD) 24 hr tablet Take 1,000 mg by mouth 2 (two) times daily with a meal.    . nitroGLYCERIN (NITROSTAT) 0.4 MG SL tablet Place 1 tablet (0.4 mg total) under the tongue every 5 (five) minutes as needed for chest pain. 25 tablet 3  . aspirin EC 81 MG EC tablet Take 1 tablet (81 mg total) by mouth daily.     No current facility-administered medications for this  visit.    Allergies:   No known allergies    Social History:  The patient  reports that she has never smoked. She does not have any smokeless tobacco history on file. She reports that she does not drink alcohol or use illicit drugs.   Family History:  The patient's family history includes Cancer in her mother; Diabetes in her brother, brother, brother, brother, brother, sister, sister, sister, and sister; Hyperlipidemia in her father; Hypertension in her father.    ROS:  General:no colds or fevers, no weight changes Skin:no rashes or ulcers HEENT:no blurred vision, no congestion CV:see HPI PUL:see HPI GI:no diarrhea constipation or melena, no indigestion GU:no hematuria, no  dysuria MS:no joint pain, no claudication Neuro:no syncope, + lightheadedness at times during the day. Endo:+ diabetes controlled, no thyroid disease  GYN: no menses for several months denies pregnancy,  Has not had a pap smear in some time, to see gyn- she believes she is having menapause.  Wt Readings from Last 3 Encounters:  07/30/15 159 lb (72.122 kg)  05/14/15 161 lb 9.6 oz (73.3 kg)  10/26/14 159 lb 6.3 oz (72.3 kg)     PHYSICAL EXAM: VS:  BP 150/82 mmHg  Pulse 84  Ht 5' (1.524 m)  Wt 159 lb (72.122 kg)  BMI 31.05 kg/m2 , BMI Body mass index is 31.05 kg/(m^2). General:Pleasant affect, NAD Skin:Warm and dry, brisk capillary refill HEENT:normocephalic, sclera clear, mucus membranes moist Neck:supple, no JVD, no bruits  Heart:S1S2 RRR without murmur, gallup, rub or click Lungs:clear without rales, rhonchi, or wheezes WUJ:WJXBAbd:soft, non tender, + BS, do not palpate liver spleen or masses Ext:no lower ext edema, 2+ pedal pulses, 2+ radial pulses Neuro:alert and oriented, MAE, follows commands, + facial symmetry    EKG:  EKG is NOT ordered today.    Recent Labs: 05/13/2015: ALT 23; BUN 30*; Magnesium 2.3; Potassium 4.0; Sodium 135; TSH 0.928 05/14/2015: Creatinine, Ser 0.69 05/16/2015: Hemoglobin 9.6*; Platelets 302    Lipid Panel    Component Value Date/Time   CHOL 143 05/13/2015 0731   TRIG 117 05/13/2015 0731   HDL 35* 05/13/2015 0731   CHOLHDL 4.1 05/13/2015 0731   VLDL 23 05/13/2015 0731   LDLCALC 85 05/13/2015 0731       Other studies Reviewed: Additional studies/ records that were reviewed today include: ER visit, cath report, echo.   ASSESSMENT AND PLAN:  1.  CAD with hx NSTEMI in 04/2015, and residual disease in LAD  Continue BB, ACe and statin.  Also ASA. Continue to exercise and eat healthy.  2. Hyperlipidemia,  Goal < 70 LDL, will recheck today on lipitor 80 mg.   3. Dizziness, may be hypotension, will have her return for BP check in 1-2 weeks after  taking her meds.  May need to decrease BB.  4. Fatigue check TSH  5.  DM- controlled.   6.  Fast HR at times, not really bothersome and she believes it is related to menopause, if it continues she will call and we can add event monitor.    Current medicines are reviewed with the patient today.  The patient Has no concerns regarding medicines.  The following changes have been made:  See above Labs/ tests ordered today include:see above  Disposition:   FU:  see above  Signed, Leone BrandINGOLD,LAURA R, NP  07/30/2015 8:46 AM    [facility] [address], [place], [place]  [address]/ [address] [place], [place] Phone: [phone]  [phone]; Fax: [phone]

## 2015-07-30 NOTE — Patient Instructions (Signed)
Medication Instructions:  Your physician recommends that you continue on your current medications as directed. Please refer to the Current Medication list given to you today.   Labwork: Lipid, Cbc, Cmet, Tsh  Testing/Procedures: None ordered  Follow-Up: Your physician recommends that you schedule a follow-up appointment with the BP clinic ( Please take your medications that day prior to your appointment)  Your physician recommends that you schedule a follow-up appointment in: 3 months with Dr.Nelson    Any Other Special Instructions Will Be Listed Below (If Applicable).     If you need a refill on your cardiac medications before your next appointment, please call your pharmacy.

## 2015-08-06 ENCOUNTER — Ambulatory Visit (INDEPENDENT_AMBULATORY_CARE_PROVIDER_SITE_OTHER)
Payer: No Typology Code available for payment source | Admitting: Pharmacist Clinician (PhC)/ Clinical Pharmacy Specialist

## 2015-08-06 ENCOUNTER — Encounter: Payer: Self-pay | Admitting: Pharmacist Clinician (PhC)/ Clinical Pharmacy Specialist

## 2015-08-06 VITALS — BP 142/82 | HR 80 | Ht 60.0 in | Wt 160.0 lb

## 2015-08-06 DIAGNOSIS — I10 Essential (primary) hypertension: Secondary | ICD-10-CM

## 2015-08-06 NOTE — Progress Notes (Signed)
     08/06/2015 Rebecca [name] 02/01/1966 [id number]   HPI:  Rebecca [name] is a 49 y.o. female patient of Dr. Delton SeeNelson, with a PMH below who presents today for hypertension clinic evaluation.  Rebecca [name] is here today with a Spanish interpreter.  She complains of occasional dizziness, especially when she's in a hurry, and has noticed some head pressure and cough over the past few days.   Cardiac Hx: NSTEMI 04/2015, treated medically  Family Hx: father with hypertension and hyperlipidemia; all 9 siblings with diabetes  Social WU:JWJXHx:does not smoke or drink alcohol, usually has 1 cup of coffee per day  Diet: cooks meals herself at home, only adds small amount of salt when cooking; does not add at table; avoids canned foods, prefers salad;  Usually only eats out on Sundays  Exercise: walking 15-20 minutes most days   Home BP readings: no home BP cuff.  Has occasionally checked at pharmacy, but not recently  Current antihypertensive medications: lisinopril hctz 20/12.5 qam, carvedilol 6.25 mg bid   Current Outpatient Prescriptions  Medication Sig Dispense Refill  . aspirin EC 81 MG EC tablet Take 1 tablet (81 mg total) by mouth daily.    Marland Kitchen. atorvastatin (LIPITOR) 80 MG tablet Take 1 tablet (80 mg total) by mouth daily at 6 PM. 30 tablet 11  . carvedilol (COREG) 6.25 MG tablet Take 1 tablet (6.25 mg total) by mouth 2 (two) times daily with a meal. 60 tablet 11  . gabapentin (NEURONTIN) 600 MG tablet Take 2 tablets (1,200 mg total) by mouth at bedtime. 30 tablet 1  . HYDROcodone-acetaminophen (NORCO/VICODIN) 5-325 MG per tablet take 1 tablet by mouth every 4-6 as needed for leg pain  0  . insulin aspart (NOVOLOG) 100 UNIT/ML injection Inject 6 Units into the skin 3 (three) times daily before meals. 10 mL 1  . insulin detemir (LEVEMIR) 100 UNIT/ML injection Inject 0.42 mLs (42 Units total) into the skin at bedtime. 10 mL 1  . lisinopril-hydrochlorothiazide (PRINZIDE,ZESTORETIC) 20-12.5 MG per  tablet Take 1 tablet by mouth daily. For blood pressure 30 tablet 0  . metFORMIN (GLUMETZA) 1000 MG (MOD) 24 hr tablet Take 1,000 mg by mouth 2 (two) times daily with a meal.    . nitroGLYCERIN (NITROSTAT) 0.4 MG SL tablet Place 1 tablet (0.4 mg total) under the tongue every 5 (five) minutes as needed for chest pain. 25 tablet 3   No current facility-administered medications for this visit.    Allergies  Allergen Reactions  . No Known Allergies     Past Medical History  Diagnosis Date  . Diabetes mellitus   . Hypertension   . Non-STEMI (non-ST elevated myocardial infarction) [facility](HCC)     Medical therapy for distal LAD dissection  . Dyslipidemia   . Anemia     Blood pressure 142/82, pulse 80, height 5' (1.524 m), weight 160 lb (72.576 kg).    Phillips HayKristin Alvstad PharmD CPP [facility]

## 2015-08-06 NOTE — Assessment & Plan Note (Signed)
Today Rebecca [name] blood pressure is at goal, 142/82.  When standing her pressure dropped to 122/76.  She has reported having a cough in the past few days, along with head pressure and headaches.  She states the cough is not bothersome and does not happen every day.  For now I will not make any changes or increases to her medication.  The dizziness she experiences may be due to sudden drops in her pressure.  I have suggested that she try an OTC antihistamine for the cough and head pressure, as her symptoms sound more allergy related.  I also told her that if the cough worsens she will need to contact Dr. Delton SeeNelson so we can switch her off the lisinopril.  She will check her blood pressure at store BP machines from time to time and see Dr. Delton SeeNelson in February

## 2015-08-06 NOTE — Patient Instructions (Signed)
[facility] other pharmacy when you are in those stores, about 1-2 times per week  Continue with your current medications  If you notice that the cough gets worse, please call Dr. Lindaann SloughNelson's nurse and let them know.

## 2015-08-07 ENCOUNTER — Other Ambulatory Visit: Payer: Self-pay | Admitting: Physician Assistant

## 2015-08-07 ENCOUNTER — Other Ambulatory Visit (HOSPITAL_COMMUNITY)
Admission: RE | Admit: 2015-08-07 | Discharge: 2015-08-07 | Disposition: A | Discharge status: Home / Self Care | Payer: No Typology Code available for payment source | Source: Ambulatory Visit | Attending: Family Medicine | Admitting: Family Medicine

## 2015-08-07 DIAGNOSIS — Z01419 Encounter for gynecological examination (general) (routine) without abnormal findings: Secondary | ICD-10-CM | POA: Insufficient documentation

## 2015-08-09 LAB — CYTOLOGY - PAP

## 2015-10-28 ENCOUNTER — Ambulatory Visit: Payer: Self-pay | Attending: Podiatry

## 2015-10-30 ENCOUNTER — Ambulatory Visit: Payer: Self-pay | Admitting: Cardiology

## 2015-11-05 ENCOUNTER — Encounter: Payer: Self-pay | Admitting: Podiatry

## 2015-11-05 ENCOUNTER — Ambulatory Visit (INDEPENDENT_AMBULATORY_CARE_PROVIDER_SITE_OTHER): Payer: No Typology Code available for payment source | Admitting: Podiatry

## 2015-11-05 ENCOUNTER — Ambulatory Visit: Payer: No Typology Code available for payment source

## 2015-11-05 VITALS — BP 169/98 | HR 93 | Resp 18

## 2015-11-05 DIAGNOSIS — R52 Pain, unspecified: Secondary | ICD-10-CM

## 2015-11-05 DIAGNOSIS — S92911A Unspecified fracture of right toe(s), initial encounter for closed fracture: Secondary | ICD-10-CM

## 2015-11-05 DIAGNOSIS — L97511 Non-pressure chronic ulcer of other part of right foot limited to breakdown of skin: Secondary | ICD-10-CM

## 2015-11-05 NOTE — Progress Notes (Signed)
   Subjective:    Patient ID: Rebecca [name], female    DOB: June 10, 1966, 50 y.o.   MRN: [id number]  HPI  50 year old female presents to the office today for concerns of a wound to the right big toe which has been ongoing for the last several weeks. [facility]. She states that she has been applying antibiotic ointment overlying the area daily. She's been try clean the areas well. Denies any drainage or pus. Denies any redness or red streaks. Also her left second toe is been swollen over the last couple weeks but denies any pain. Denies any redness or warmth or any open wounds. No other complaints.  She states her blood sugars better controlled and in the 100s.   Review of Systems  All other systems reviewed and are negative.      Objective:   Physical Exam General: AAO x3, NAD  Dermatological: On the medial aspect of the right hallux is a annular hyperkeratotic lesion with central ulceration and surrounding macerated tissue. Upon debridement the wound appears to be granular and superficial nerves no probing, undermining or tunneling. The wound today measures approximately 2 x 1 cm. there is no swelling erythema, ascending saline disc, fluctuance, crepitus, malodor, drainage or pus.  Vascular: Dorsalis Pedis artery and Posterior Tibial artery pedal pulses are 2/4 bilateral with immedate capillary fill time. Pedal hair growth present. No varicosities and no lower extremity edema present bilateral. There is no pain with calf compression, swelling, warmth, erythema.   Neruologic: Sensation decreased with Dorann Ou monofilament, decreased vibratory sensation.  Musculoskeletal: Hammertoe contractures are present. There is edema to the left second toe on the face other no significant tenderness overlying this area. No other areas of tenderness or swelling to bilateral lower extremities.  Gait: Unassisted, Nonantalgic.        Assessment & Plan:  50 year old female right hallux ulceration, left second toe fracture -Treatment options discussed including all alternatives, risks, and complications -Etiology of symptoms were discussed -X-rays were obtained and reviewed with the patient. There is a fracture the base of the proximal phalanx of the left second toe. There is no cortical destruction or soft tissue emphysema along the right side. -Hyperkeratotic lesion and wound debridement on the right side. Continue in about ointment dressing changes. Continue with offloading pads were dispensed. -Surgical shoe for left side. -Monitor for any clinical signs or symptoms of infection and directed to call the office immediately should any occur or go to the ER. -Follow-up as scheduled or sooner if any problems arise. In the meantime, encouraged to call the office with any questions, concerns, change in symptoms.   Ovid Curd, DPM

## 2015-11-07 DIAGNOSIS — S92919A Unspecified fracture of unspecified toe(s), initial encounter for closed fracture: Secondary | ICD-10-CM | POA: Insufficient documentation

## 2015-11-19 ENCOUNTER — Ambulatory Visit: Payer: Self-pay

## 2015-11-19 ENCOUNTER — Ambulatory Visit (INDEPENDENT_AMBULATORY_CARE_PROVIDER_SITE_OTHER): Payer: No Typology Code available for payment source | Admitting: Podiatry

## 2015-11-19 ENCOUNTER — Encounter: Payer: Self-pay | Admitting: Podiatry

## 2015-11-19 VITALS — BP 168/95 | HR 83 | Resp 16

## 2015-11-19 DIAGNOSIS — M79671 Pain in right foot: Secondary | ICD-10-CM

## 2015-11-19 DIAGNOSIS — M79672 Pain in left foot: Secondary | ICD-10-CM

## 2015-11-19 DIAGNOSIS — S92911A Unspecified fracture of right toe(s), initial encounter for closed fracture: Secondary | ICD-10-CM

## 2015-11-19 DIAGNOSIS — L97511 Non-pressure chronic ulcer of other part of right foot limited to breakdown of skin: Secondary | ICD-10-CM

## 2015-11-19 NOTE — Patient Instructions (Signed)
Can use cetaphil cream for your feet but do not put it between your toes.  Monitor for any signs/symptoms of infection. Call the office immediately if any occur or go directly to the emergency room. Call with any questions/concerns.

## 2015-11-21 NOTE — Progress Notes (Signed)
Patient ID: Rebecca [name], female   DOB: 11-15-1965, 50 y.o.   MRN: [id number]  Subjective: 50 year old female presents to the office today with a friend in Spanish interpreter for follow-up evaluation await of the right foot as well as second toe fracture. She states that she has continued to use antibiotic ointment to the wound on the right side. She is not noticed any drainage or redness or any pus. She is continued surgical shoe on the left side. No other complaints at this time in no acute changes otherwise. She denies any systemic complaints as fevers, chills, nausea, vomiting. No calf pain, chest pain, shortness of breath. No other complaints at this time.   Objective: General: AAO x3, NAD  Dermatological: On the medial aspect of the right hallux is a annular hyperkeratotic lesion with central ulceration and surrounding macerated tissue. Upon debridement the wound appears to be granular and superficial and there is no probing, undermining or tunneling. The wound today measures 1.5 x 0.5 cm. There is no surrounding erythema, ascending cellulitis, fluctuance, crepitus, malodor, drainage or pus.  Vascular: Dorsalis Pedis artery and Posterior Tibial artery pedal pulses are 2/4 bilateral with immedate capillary fill time. Pedal hair growth present. No varicosities and no lower extremity edema present bilateral. There is no pain with calf compression, swelling, warmth, erythema.   Neruologic: Sensation decreased with Dorann Ou monofilament, decreased vibratory sensation.  Musculoskeletal: Hammertoe contractures are present. There is edema to the left second toe on the face other no significant tenderness overlying this area. No other areas of tenderness or swelling to bilateral lower extremities.  Gait: Unassisted, Nonantalgic.   Assessment: 50 year old female right hallux ulceration, left second toe fracture  Plan: -Treatment options discussed including all alternatives, risks, and  complications -X-rays were obtained and reviewed with the patient. There is a fracture the base of the proximal phalanx of the left second toe which continues. There is no cortical destruction or soft tissue emphysema along the right side. -Hyperkeratotic lesion and wound debridement on the right side. Continue in about ointment dressing changes. Continue with offloading pads were dispensed. -Surgical shoe for left side. -Monitor for any clinical signs or symptoms of infection and directed to call the office immediately should any occur or go to the ER. -Follow-up as scheduled or sooner if any problems arise. In the meantime, encouraged to call the office with any questions, concerns, change in symptoms.   Ovid Curd, DPM

## 2015-11-27 ENCOUNTER — Encounter: Payer: Self-pay | Admitting: Cardiology

## 2015-12-06 ENCOUNTER — Encounter: Payer: Self-pay | Admitting: Podiatry

## 2015-12-06 ENCOUNTER — Ambulatory Visit (INDEPENDENT_AMBULATORY_CARE_PROVIDER_SITE_OTHER): Payer: No Typology Code available for payment source | Admitting: Podiatry

## 2015-12-06 ENCOUNTER — Ambulatory Visit: Payer: No Typology Code available for payment source

## 2015-12-06 VITALS — BP 178/102 | HR 94 | Temp 97.4°F | Resp 18

## 2015-12-06 DIAGNOSIS — S92912D Unspecified fracture of left toe(s), subsequent encounter for fracture with routine healing: Secondary | ICD-10-CM

## 2015-12-06 DIAGNOSIS — R52 Pain, unspecified: Secondary | ICD-10-CM

## 2015-12-06 DIAGNOSIS — L97511 Non-pressure chronic ulcer of other part of right foot limited to breakdown of skin: Secondary | ICD-10-CM

## 2015-12-06 MED ORDER — CEPHALEXIN 500 MG PO CAPS: 500.0000 mg | ORAL_CAPSULE | Freq: Three times a day (TID) | ORAL | Status: DC

## 2015-12-06 MED ORDER — HYDROCODONE-ACETAMINOPHEN 5-325 MG PO TABS: 1.0000 | ORAL_TABLET | Freq: Four times a day (QID) | ORAL | Status: DC | PRN

## 2015-12-06 NOTE — Progress Notes (Signed)
Patient ID: Lenor Derricksidora I Flores, female   DOB: 07/05/1966, 50 y.o.   MRN: [id number]  Subjective: 50 year old female presents to the office today with a friend for follow up evaluation of right foot wound as well as left second toe pain. She states that she is having more pain to the right wound as well as the left second toe over the last day or so. She's had some clear drainage coming from the wound on the right side denies any pus. She says been somewhat red around the wound as well. Denies any increase in swelling. She is continued antibiotic ointment to the right big toe. She is continued the surgical shoe the left side. She denies any systemic complaints as fevers, chills, nausea, vomiting. No calf pain, chest pain, shortness of breath. No other complaints at this time.   Objective: General: AAO x3, NAD  Dermatological: On the medial aspect of the right hallux is a annular hyperkeratotic lesion with central ulceration and surrounding macerated tissue. Macerated tissue surrounding the wound is well. Upon debridement the wound appears to be granular and superficial and there is no probing, undermining or tunneling. The wound today measures 1.8 x 1cm. this is increased since last appointment. There is no drainage or pus expressed. There is no surrounding erythema, ascending saline cellulitis, fluctuance, crepitus, malodor, drainage or pus.   Vascular: Dorsalis Pedis artery and Posterior Tibial artery pedal pulses are 2/4 bilateral with immedate capillary fill time. Pedal hair growth present. No varicosities and no lower extremity edema present bilateral. There is no pain with calf compression, swelling, warmth, erythema.   Neruologic: Sensation decreased with Dorann OuSimms Weinstein monofilament, decreased vibratory sensation.  Musculoskeletal: Hammertoe contractures are present. There is edema to the left second toe on the face other no significant tenderness overlying this area and there is mild tenderness to  the base of the 2nd toe. The toe does sit in slightly deviated position. No other areas of tenderness or swelling to bilateral lower extremities.  Gait: Unassisted, Nonantalgic.   Assessment: 50 year old female right hallux ulceration, left second toe fracture  Plan: -Treatment options discussed including all alternatives, risks, and complications -X-rays were obtained and reviewed with the patient. There is a fracture the base of the proximal phalanx of the left second toe which continues. There is no cortical destruction or soft tissue emphysema along the right side. -Wound was debrided on the right foot to remove the hyperkeratotic tissue as well as some of the macerated tissue on the wound. The wound is alert and last appointment. Continue strict offloading. Continue daily dressing changes. -Surgical shoe for left side. Discussed that she may need surgical intervention to straighten the toe in the future once the toe heels. -Monitor for any clinical signs or symptoms of infection and directed to call the office immediately should any occur or go to the ER. -Follow-up in 2 weeks or sooner if any problems arise. In the meantime, encouraged to call the office with any questions, concerns, change in symptoms.   Ovid CurdMatthew Wagoner, DPM

## 2015-12-09 ENCOUNTER — Ambulatory Visit: Payer: No Typology Code available for payment source | Admitting: Sports Medicine

## 2015-12-20 ENCOUNTER — Ambulatory Visit: Payer: No Typology Code available for payment source | Admitting: Podiatry

## 2015-12-30 ENCOUNTER — Encounter: Payer: Self-pay | Admitting: Podiatry

## 2015-12-30 ENCOUNTER — Ambulatory Visit (INDEPENDENT_AMBULATORY_CARE_PROVIDER_SITE_OTHER): Payer: No Typology Code available for payment source | Admitting: Podiatry

## 2015-12-30 ENCOUNTER — Ambulatory Visit: Payer: No Typology Code available for payment source

## 2015-12-30 DIAGNOSIS — S92912D Unspecified fracture of left toe(s), subsequent encounter for fracture with routine healing: Secondary | ICD-10-CM

## 2015-12-30 DIAGNOSIS — R52 Pain, unspecified: Secondary | ICD-10-CM

## 2015-12-30 DIAGNOSIS — L97511 Non-pressure chronic ulcer of other part of right foot limited to breakdown of skin: Secondary | ICD-10-CM

## 2015-12-30 MED ORDER — AMOXICILLIN-POT CLAVULANATE 875-125 MG PO TABS: 1.0000 | ORAL_TABLET | Freq: Two times a day (BID) | ORAL | Status: DC

## 2015-12-30 NOTE — Progress Notes (Signed)
Patient ID: Rebecca [name], female   DOB: 11/13/1965, 50 y.o.   MRN: [id number]  Subjective: 50 year old female presents to the office today with a friend for follow up evaluation of right foot wound as well as left second toe pain. She states the wound is about the same as well as the left second toe. She denies any pus coming from the wound on the right foot but she does get some drainage she states. Denies any swelling redness or red streaks. Denies he swelling to the foot. Denies any pain to the second toe on the left foot as well. She is continue with a surgical shoe and requesting a new one today as hers is worn out. She denies any systemic complaints as fevers, chills, nausea, vomiting. No calf pain, chest pain, shortness of breath. No other complaints at this time.   Objective: General: AAO x3, NAD  Dermatological: On the medial aspect of the right hallux is a annular hyperkeratotic lesion with central ulceration and surrounding macerated tissue. Upon debridement the wound appears to be granular and superficial and there is no probing, undermining or tunneling. The wound today measures 1.8 x 1cm. And is about the same size compared to last appointment. There is no drainage or pus expressed. There is no surrounding erythema, ascending cellulitis, fluctuance, crepitus, malodor, drainage or pus.   Vascular: Dorsalis Pedis artery and Posterior Tibial artery pedal pulses are 2/4 bilateral with immedate capillary fill time. Pedal hair growth present. No varicosities and no lower extremity edema present bilateral. There is no pain with calf compression, swelling, warmth, erythema.   Neruologic: Sensation decreased with Dorann OuSimms Weinstein monofilament, decreased vibratory sensation.  Musculoskeletal: Hammertoe contractures are present. There is edema to the left second toe. There is no significant tenderness synovitis and there is no erythema or increase in warmth. The toe does sit in slightly deviated  position. No other areas of tenderness or swelling to bilateral lower extremities.  Gait: Unassisted, Nonantalgic.   Assessment: 50 year old female right hallux ulceration, left second toe fracture  Plan: -Treatment options discussed including all alternatives, risks, and complications -X-rays were obtained and reviewed with the patient. On the left side there is some evidence of healing. On the right side there is question area of cortical changes suggestive of early ostial myelitis. These findings were discussed with her today. -The wound on the right foot was debrided to healthy, granular wound base. It is no clinical signs of infection. However given the longevity of the wound as well as partial changes on x-ray will go ahead and restart and orthotics. Prescribed Augmentin today. Continue daily dressing changes. Also as the wound has not changed much we'll refer to the wound care center. Monitor for any clinical signs or symptoms of infection and directed to call the office immediately should any occur or go to the ER. Continue offloading pads on the right foot. -Left foot there is some evidence of a minimal the second toe. Swelling does appear to be decreased compared to last appointment. Continue surgical shoe. New surgical shoe given today. -Follow-up in 2 weeks or sooner if any problems arise. In the meantime, encouraged to call the office with any questions, concerns, change in symptoms.   Ovid CurdMatthew Mckinzy Fuller, DPM

## 2016-01-13 ENCOUNTER — Ambulatory Visit: Payer: No Typology Code available for payment source | Admitting: Podiatry

## 2016-01-28 ENCOUNTER — Ambulatory Visit: Payer: No Typology Code available for payment source | Admitting: Sports Medicine

## 2016-01-28 ENCOUNTER — Encounter: Payer: Self-pay | Admitting: Sports Medicine

## 2016-01-28 DIAGNOSIS — M79671 Pain in right foot: Secondary | ICD-10-CM

## 2016-01-28 DIAGNOSIS — L97511 Non-pressure chronic ulcer of other part of right foot limited to breakdown of skin: Secondary | ICD-10-CM

## 2016-01-28 DIAGNOSIS — L89891 Pressure ulcer of other site, stage 1: Secondary | ICD-10-CM

## 2016-01-28 DIAGNOSIS — S92912D Unspecified fracture of left toe(s), subsequent encounter for fracture with routine healing: Secondary | ICD-10-CM

## 2016-01-28 DIAGNOSIS — B351 Tinea unguium: Secondary | ICD-10-CM

## 2016-01-28 DIAGNOSIS — E1142 Type 2 diabetes mellitus with diabetic polyneuropathy: Secondary | ICD-10-CM

## 2016-01-28 DIAGNOSIS — M79672 Pain in left foot: Secondary | ICD-10-CM

## 2016-01-28 NOTE — Progress Notes (Signed)
Patient ID: Rebecca [name], female   DOB: 02/28/1966, 50 y.o.   MRN: [id number] Subjective: Rebecca [name] is a 50 y.o. female patient seen in office for evaluation of ulceration of the right big toe. States that ulcer has been present for 3 months. Patient has a history of diabetes and states that her blood sugar is "good".   Patient is changing the dressing using antibacterial soap and bandaid. Denies nausea/fever/vomiting/chills/night sweats/shortness of breath/pain. Patient has been wearing post op shoe on left for 2nd toe fracture with no pain or symptoms. Patient has no other pedal complaints at this time.  Completed Augment with no problems.   Patient Active Problem List   Diagnosis Date Noted  . Toe ulcer, right (HCC) 11/07/2015  . Toe fracture 11/07/2015  . Essential hypertension 05/14/2015  . CAD- with distal LAD disease, ? 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 05/14/2015  . NSTEMI (non-ST elevated myocardial infarction) (HCC)   . Syncope 05/13/2015  . Acute kidney injury (HCC) 10/25/2014  . Hyperglycemia 10/25/2014  . Abrasion of thigh, left 10/25/2014  . DM (diabetes mellitus), type 2, uncontrolled (HCC)   . DM hyperosmolar coma, type 2 (HCC) 05/22/2012  . Hyperosmolar syndrome 05/21/2012   Current Outpatient Prescriptions on File Prior to Visit  Medication Sig Dispense Refill  . amoxicillin-clavulanate (AUGMENTIN) 875-125 MG tablet Take 1 tablet by mouth 2 (two) times daily. 20 tablet 0  . aspirin EC 81 MG EC tablet Take 1 tablet (81 mg total) by mouth daily.    Marland Kitchen. atorvastatin (LIPITOR) 80 MG tablet Take 1 tablet (80 mg total) by mouth daily at 6 PM. 30 tablet 11  . carvedilol (COREG) 6.25 MG tablet Take 1 tablet (6.25 mg total) by mouth 2 (two) times daily with a meal. 60 tablet 11  . cephALEXin (KEFLEX) 500 MG capsule Take 1 capsule (500 mg total) by mouth 3 (three) times daily. 30 capsule 0  . gabapentin (NEURONTIN) 600 MG tablet Take 2 tablets (1,200 mg total) by mouth at bedtime. 30  tablet 1  . HYDROcodone-acetaminophen (NORCO/VICODIN) 5-325 MG per tablet take 1 tablet by mouth every 4-6 as needed for leg pain  0  . HYDROcodone-acetaminophen (NORCO/VICODIN) 5-325 MG tablet Take 1 tablet by mouth every 6 (six) hours as needed. 30 tablet 0  . insulin aspart (NOVOLOG) 100 UNIT/ML injection Inject 6 Units into the skin 3 (three) times daily before meals. 10 mL 1  . insulin detemir (LEVEMIR) 100 UNIT/ML injection Inject 0.42 mLs (42 Units total) into the skin at bedtime. 10 mL 1  . lisinopril-hydrochlorothiazide (PRINZIDE,ZESTORETIC) 20-12.5 MG per tablet Take 1 tablet by mouth daily. For blood pressure 30 tablet 0  . metFORMIN (GLUMETZA) 1000 MG (MOD) 24 hr tablet Take 1,000 mg by mouth 2 (two) times daily with a meal.    . nitroGLYCERIN (NITROSTAT) 0.4 MG SL tablet Place 1 tablet (0.4 mg total) under the tongue every 5 (five) minutes as needed for chest pain. 25 tablet 3   No current facility-administered medications on file prior to visit.   Allergies  Allergen Reactions  . No Known Allergies     No results found for this or any previous visit (from the past 2160 hour(s)).  Objective: There were no vitals filed for this visit.  General: Patient is awake, alert, oriented x 3 and in no acute distress.  Dermatology: Skin is warm and dry bilateral with a partial thickness ulceration present Right plantar medial hallux. Ulceration measures 1cm x 0.4 cm x 0.1 cm.  There is a keratotic border with a granular base. The ulceration does not probe to bone. There is no malodor, no active drainage, no erythema, no edema. No acute signs of infection. Bilateral hallux nails are thickened with trauma lines and subungal debris, all other nails within normal limits.    Vascular: Dorsalis Pedis pulse = 2/4 Bilateral,  Posterior Tibial pulse = 2/4 Bilateral,  Capillary Fill Time < 5 seconds  Neurologic: Protective sensation diminished bilateral using 5.07/10g Semmes Weinstein  Monofilament.  Musculosketal: There is no pain to left 2nd toe fracture site with hammertoe. No Pain with palpation to ulcerated area at right hallux. No pain with compression to calves bilateral.  Assessment and Plan:  Problem List Items Addressed This Visit      Musculoskeletal and Integument   Toe ulcer, right (HCC) - Primary   Toe fracture    Other Visit Diagnoses    Foot pain, bilateral        Dermatophytosis of nail        Diabetic polyneuropathy associated with type 2 diabetes mellitus (HCC)          -Examined patient  -Discussed the progression of the wound and treatment alternatives. -Previous Xrays reviewed - Excisionally dedbrided ulceration at right hallux to healthy bleeding borders using a sterile chisel blade. -Applied topical antibiotic cream, offloading pads and dry sterile dressing and instructed patient to continue with daily dressings at home consisting of neosporin and bandaid/dry sterile dressing. - Advised patient to go to the ER or return to office if the wound worsens or if constitutional symptoms are present. -Cont with post op shoe for Left 2nd toe fracture will xray at next encounter to determine if we can discontinue post op shoe based on fracture and symptoms -Bilateral hallux nails debrided using sterile nail nipper for patient without incident -Patient to return to office in 3-4 weeks for follow up care and evaluation or sooner if problems arise.  Asencion Islam, DPM

## 2016-01-28 NOTE — Patient Instructions (Signed)
Cleanse right big toe ulcer with soap and water and then apply neosporin and bandaid daily.

## 2016-02-24 ENCOUNTER — Encounter: Payer: Self-pay | Admitting: Sports Medicine

## 2016-02-24 ENCOUNTER — Ambulatory Visit: Payer: Self-pay

## 2016-02-24 ENCOUNTER — Ambulatory Visit: Payer: No Typology Code available for payment source | Admitting: Sports Medicine

## 2016-02-24 DIAGNOSIS — L89891 Pressure ulcer of other site, stage 1: Secondary | ICD-10-CM

## 2016-02-24 DIAGNOSIS — L97511 Non-pressure chronic ulcer of other part of right foot limited to breakdown of skin: Secondary | ICD-10-CM

## 2016-02-24 DIAGNOSIS — E1142 Type 2 diabetes mellitus with diabetic polyneuropathy: Secondary | ICD-10-CM

## 2016-02-24 DIAGNOSIS — S92912D Unspecified fracture of left toe(s), subsequent encounter for fracture with routine healing: Secondary | ICD-10-CM

## 2016-02-24 DIAGNOSIS — M79671 Pain in right foot: Secondary | ICD-10-CM

## 2016-02-24 DIAGNOSIS — M79672 Pain in left foot: Secondary | ICD-10-CM

## 2016-02-24 NOTE — Progress Notes (Signed)
Patient ID: Lenor Derricksidora I Flores, female   DOB: 09/07/1966, 50 y.o.   MRN: [id number]   Subjective: Lenor Derricksidora I Flores is a 50 y.o. Diabetic female patient seen in office for evaluation of ulceration of the right big toe and evaluation of healing of fracture at Left 2nd toe.  Patient has been wearing post op shoe on left and is changing the dressing on right big toe using neosporin and bandaid. Denies nausea/fever/vomiting/chills/night sweats/shortness of breath/pain. Patient has no other pedal complaints at this time.   Patient Active Problem List   Diagnosis Date Noted  . Toe ulcer, right (HCC) 11/07/2015  . Toe fracture 11/07/2015  . Essential hypertension 05/14/2015  . CAD- with distal LAD disease, ? 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 05/14/2015  . NSTEMI (non-ST elevated myocardial infarction) (HCC)   . Syncope 05/13/2015  . Acute kidney injury (HCC) 10/25/2014  . Hyperglycemia 10/25/2014  . Abrasion of thigh, left 10/25/2014  . DM (diabetes mellitus), type 2, uncontrolled (HCC)   . DM hyperosmolar coma, type 2 (HCC) 05/22/2012  . Hyperosmolar syndrome 05/21/2012   Current Outpatient Prescriptions on File Prior to Visit  Medication Sig Dispense Refill  . amoxicillin-clavulanate (AUGMENTIN) 875-125 MG tablet Take 1 tablet by mouth 2 (two) times daily. 20 tablet 0  . aspirin EC 81 MG EC tablet Take 1 tablet (81 mg total) by mouth daily.    Marland Kitchen. atorvastatin (LIPITOR) 80 MG tablet Take 1 tablet (80 mg total) by mouth daily at 6 PM. 30 tablet 11  . carvedilol (COREG) 6.25 MG tablet Take 1 tablet (6.25 mg total) by mouth 2 (two) times daily with a meal. 60 tablet 11  . cephALEXin (KEFLEX) 500 MG capsule Take 1 capsule (500 mg total) by mouth 3 (three) times daily. 30 capsule 0  . gabapentin (NEURONTIN) 600 MG tablet Take 2 tablets (1,200 mg total) by mouth at bedtime. 30 tablet 1  . HYDROcodone-acetaminophen (NORCO/VICODIN) 5-325 MG per tablet take 1 tablet by mouth every 4-6 as needed for leg pain  0  .  HYDROcodone-acetaminophen (NORCO/VICODIN) 5-325 MG tablet Take 1 tablet by mouth every 6 (six) hours as needed. 30 tablet 0  . insulin aspart (NOVOLOG) 100 UNIT/ML injection Inject 6 Units into the skin 3 (three) times daily before meals. 10 mL 1  . insulin detemir (LEVEMIR) 100 UNIT/ML injection Inject 0.42 mLs (42 Units total) into the skin at bedtime. 10 mL 1  . lisinopril-hydrochlorothiazide (PRINZIDE,ZESTORETIC) 20-12.5 MG per tablet Take 1 tablet by mouth daily. For blood pressure 30 tablet 0  . metFORMIN (GLUMETZA) 1000 MG (MOD) 24 hr tablet Take 1,000 mg by mouth 2 (two) times daily with a meal.    . nitroGLYCERIN (NITROSTAT) 0.4 MG SL tablet Place 1 tablet (0.4 mg total) under the tongue every 5 (five) minutes as needed for chest pain. 25 tablet 3   No current facility-administered medications on file prior to visit.   Allergies  Allergen Reactions  . No Known Allergies     No results found for this or any previous visit (from the past 2160 hour(s)).  Objective: There were no vitals filed for this visit.  General: Patient is awake, alert, oriented x 3 and in no acute distress.  Dermatology: Skin is warm and dry bilateral with a partial thickness ulceration present Right plantar medial hallux. Ulceration measures 2cm x 0.3 cm x 0.1 cm (last measurement1cm x 0.4 cm x 0.1 cm) post debridement . There is a keratotic border with a granular base. The ulceration does  not probe to bone. There is no malodor, no active drainage, no erythema, no edema. No acute signs of infection. Bilateral hallux nails are asymptomatic today, short, thickened with trauma lines and subungal debris, all other nails within normal limits.    Vascular: Dorsalis Pedis pulse = 2/4 Bilateral,  Posterior Tibial pulse = 2/4 Bilateral,  Capillary Fill Time < 5 seconds  Neurologic: Protective sensation diminished bilateral using 5.07/10g Semmes Weinstein Monofilament.  Musculosketal: There is no pain to left 2nd toe  fracture site with hammertoe. No Pain with palpation to ulcerated area at right hallux. No pain with compression to calves bilateral.  Xray, left foot: Consistent with fracture at base of left 2nd toe with no displacement, comunition, extra-articular, No other acute findings.    Assessment and Plan:  Problem List Items Addressed This Visit      Musculoskeletal and Integument   Toe ulcer, right (HCC)   Toe fracture - Primary   Relevant Orders   DG Foot Complete Left    Other Visit Diagnoses    Foot pain, bilateral        Diabetic polyneuropathy associated with type 2 diabetes mellitus (HCC)          -Examined patient  -Discussed the progression of the wound and treatment alternatives for right hallux  -Excisionally dedbrided ulceration at right hallux to healthy bleeding borders using a sterile chisel blade. -Applied topical antibiotic cream, offloading pads and dry sterile dressing and instructed patient to continue with daily dressings at home consisting of neosporin and bandaid/dry sterile dressing. - Advised patient to go to the ER or return to office if the wound worsens or if constitutional symptoms are present. -Left foot xrays reviewed -Applied and instructed patient on buddy taping left 2nd toe -Cont with post op shoe for Left 2nd toe fracture will xray at next encounter to determine if we can discontinue post op shoe based on fracture and symptoms -Patient to return to office in 3-4 weeks for follow up care and evaluation or sooner if problems arise.  Asencion Islam, DPM

## 2016-03-04 ENCOUNTER — Inpatient Hospital Stay (HOSPITAL_COMMUNITY)
Admission: EM | Admit: 2016-03-04 | Discharge: 2016-03-05 | DRG: 684 | Disposition: A | Discharge status: Home / Self Care | Payer: Self-pay | Attending: Internal Medicine | Admitting: Internal Medicine

## 2016-03-04 ENCOUNTER — Encounter (HOSPITAL_COMMUNITY): Payer: Self-pay | Admitting: Emergency Medicine

## 2016-03-04 DIAGNOSIS — I959 Hypotension, unspecified: Secondary | ICD-10-CM | POA: Diagnosis present

## 2016-03-04 DIAGNOSIS — E785 Hyperlipidemia, unspecified: Secondary | ICD-10-CM | POA: Diagnosis present

## 2016-03-04 DIAGNOSIS — S92912D Unspecified fracture of left toe(s), subsequent encounter for fracture with routine healing: Secondary | ICD-10-CM

## 2016-03-04 DIAGNOSIS — E1165 Type 2 diabetes mellitus with hyperglycemia: Secondary | ICD-10-CM | POA: Diagnosis present

## 2016-03-04 DIAGNOSIS — S92919A Unspecified fracture of unspecified toe(s), initial encounter for closed fracture: Secondary | ICD-10-CM | POA: Diagnosis present

## 2016-03-04 DIAGNOSIS — Z794 Long term (current) use of insulin: Secondary | ICD-10-CM

## 2016-03-04 DIAGNOSIS — N179 Acute kidney failure, unspecified: Principal | ICD-10-CM | POA: Diagnosis present

## 2016-03-04 DIAGNOSIS — Z7982 Long term (current) use of aspirin: Secondary | ICD-10-CM

## 2016-03-04 DIAGNOSIS — N183 Chronic kidney disease, stage 3 (moderate): Secondary | ICD-10-CM | POA: Diagnosis present

## 2016-03-04 DIAGNOSIS — I252 Old myocardial infarction: Secondary | ICD-10-CM

## 2016-03-04 DIAGNOSIS — E1142 Type 2 diabetes mellitus with diabetic polyneuropathy: Secondary | ICD-10-CM

## 2016-03-04 DIAGNOSIS — IMO0002 Reserved for concepts with insufficient information to code with codable children: Secondary | ICD-10-CM | POA: Diagnosis present

## 2016-03-04 DIAGNOSIS — L97511 Non-pressure chronic ulcer of other part of right foot limited to breakdown of skin: Secondary | ICD-10-CM

## 2016-03-04 DIAGNOSIS — E860 Dehydration: Secondary | ICD-10-CM | POA: Diagnosis present

## 2016-03-04 DIAGNOSIS — I129 Hypertensive chronic kidney disease with stage 1 through stage 4 chronic kidney disease, or unspecified chronic kidney disease: Secondary | ICD-10-CM | POA: Diagnosis present

## 2016-03-04 DIAGNOSIS — I10 Essential (primary) hypertension: Secondary | ICD-10-CM | POA: Diagnosis present

## 2016-03-04 LAB — BASIC METABOLIC PANEL
ANION GAP: 13 (ref 5–15)
BUN: 64 mg/dL — ABNORMAL HIGH (ref 6–20)
CALCIUM: 9.5 mg/dL (ref 8.9–10.3)
CHLORIDE: 99 mmol/L — AB (ref 101–111)
CO2: 21 mmol/L — AB (ref 22–32)
Creatinine, Ser: 2.3 mg/dL — ABNORMAL HIGH (ref 0.44–1.00)
GFR calc non Af Amer: 24 mL/min — ABNORMAL LOW (ref >60)
GFR, EST AFRICAN AMERICAN: 27 mL/min — AB (ref >60)
Glucose, Bld: 258 mg/dL — ABNORMAL HIGH (ref 65–99)
POTASSIUM: 4.4 mmol/L (ref 3.5–5.1)
Sodium: 133 mmol/L — ABNORMAL LOW (ref 135–145)

## 2016-03-04 LAB — CBC
HEMATOCRIT: 34.4 % — AB (ref 36.0–46.0)
HEMOGLOBIN: 11.5 g/dL — AB (ref 12.0–15.0)
MCH: 26.3 pg (ref 26.0–34.0)
MCHC: 33.4 g/dL (ref 30.0–36.0)
MCV: 78.7 fL (ref 78.0–100.0)
Platelets: 299 10*3/uL (ref 150–400)
RBC: 4.37 MIL/uL (ref 3.87–5.11)
RDW: 13.4 % (ref 11.5–15.5)
WBC: 11 10*3/uL — ABNORMAL HIGH (ref 4.0–10.5)

## 2016-03-04 LAB — CBG MONITORING, ED: GLUCOSE-CAPILLARY: 247 mg/dL — AB (ref 65–99)

## 2016-03-04 MED ORDER — SODIUM CHLORIDE 0.9 % IV BOLUS (SEPSIS)
500.0000 mL | Freq: Once | INTRAVENOUS | Status: AC
  Administered 2016-03-04: 500 mL via INTRAVENOUS

## 2016-03-04 MED ORDER — INSULIN ASPART 100 UNIT/ML ~~LOC~~ SOLN
0.0000 [IU] | Freq: Three times a day (TID) | SUBCUTANEOUS | Status: DC
  Administered 2016-03-05: 2 [IU] via SUBCUTANEOUS

## 2016-03-04 MED ORDER — ASPIRIN EC 81 MG PO TBEC
81.0000 mg | DELAYED_RELEASE_TABLET | Freq: Every day | ORAL | Status: DC
  Administered 2016-03-05: 81 mg via ORAL
  Filled 2016-03-04: qty 1

## 2016-03-04 MED ORDER — CARVEDILOL 6.25 MG PO TABS
6.2500 mg | ORAL_TABLET | Freq: Two times a day (BID) | ORAL | Status: DC
  Administered 2016-03-05: 6.25 mg via ORAL
  Filled 2016-03-04: qty 1

## 2016-03-04 MED ORDER — INSULIN DETEMIR 100 UNIT/ML ~~LOC~~ SOLN
42.0000 [IU] | Freq: Every day | SUBCUTANEOUS | Status: DC
  Administered 2016-03-05: 42 [IU] via SUBCUTANEOUS
  Filled 2016-03-04 (×3): qty 0.42

## 2016-03-04 MED ORDER — HEPARIN SODIUM (PORCINE) 5000 UNIT/ML IJ SOLN: 5000.0000 [IU] | Freq: Three times a day (TID) | INTRAMUSCULAR | Status: DC

## 2016-03-04 MED ORDER — ATORVASTATIN CALCIUM 40 MG PO TABS: 40.0000 mg | ORAL_TABLET | Freq: Every day | ORAL | Status: DC

## 2016-03-04 MED ORDER — GABAPENTIN 300 MG PO CAPS
1200.0000 mg | ORAL_CAPSULE | Freq: Two times a day (BID) | ORAL | Status: DC
  Administered 2016-03-05 (×2): 1200 mg via ORAL
  Filled 2016-03-04 (×2): qty 4

## 2016-03-04 MED ORDER — SODIUM CHLORIDE 0.9 % IV SOLN
INTRAVENOUS | Status: DC
  Administered 2016-03-05: via INTRAVENOUS
  Administered 2016-03-05: 1000 mL via INTRAVENOUS

## 2016-03-04 NOTE — ED Provider Notes (Signed)
CSN: [id number]     Arrival date & time 03/04/16  1803 History   First MD Initiated Contact with Patient 03/04/16 2212     Chief Complaint  Patient presents with  . Headache  . Dizziness     Patient speaks Spanish primarily but was translated by her son. Patient is a 50 y.o. female presenting with headaches and dizziness. The history is provided by the patient.  Headache Associated symptoms: dizziness and nausea   Associated symptoms: no back pain and no fever   Dizziness Associated symptoms: headaches and nausea   Associated symptoms: no shortness of breath   Patient has had a headache and dizziness for last couple days. Has had some nausea without vomiting. Has a throbbing headache on the back of her head going to the front. Sugars been running high, 300 and up to 500. States she's had a little bit of chills at times. No diarrhea. No chest pain. No dysuria. Rare cough. No chest pain. Does have a history of diabetes.She states that her dizziness felt like her vision gets black and she would feel both lightheaded and that the room was spinning. States she's had episodes like this in the past. Initial blood pressure was low.  Past Medical History  Diagnosis Date  . Diabetes mellitus   . Hypertension   . Non-STEMI (non-ST elevated myocardial infarction) [facility])     Medical therapy for distal LAD dissection  . Dyslipidemia   . Anemia    Past Surgical History  Procedure Laterality Date  . Cardiac catheterization N/A 05/14/2015    Procedure: Left Heart Cath and Coronary Angiography;  Surgeon: Marykay Lex, MD; dLAD 60%>>95% (small vessel, ?dissection), CFX & RCA systems no sig dz, EF & LVEDP nl      Family History  Problem Relation Age of Onset  . Hypertension Father   . Hyperlipidemia Father   . Cancer Mother   . Diabetes Sister   . Diabetes Brother   . Diabetes Sister   . Diabetes Sister   . Diabetes Sister   . Diabetes Brother   . Diabetes Brother   . Diabetes Brother   .  Diabetes Brother    Social History  Substance Use Topics  . Smoking status: Never Smoker   . Smokeless tobacco: None  . Alcohol Use: No   OB History    No data available     Review of Systems  Constitutional: Positive for appetite change. Negative for fever.  Respiratory: Negative for shortness of breath.   Cardiovascular: Negative for leg swelling.  Gastrointestinal: Positive for nausea.  Genitourinary: Positive for frequency.  Musculoskeletal: Negative for back pain.  Skin: Negative for wound.  Neurological: Positive for dizziness and headaches.      Allergies  Review of patient's allergies indicates no active allergies.  Home Medications   Prior to Admission medications   Medication Sig Start Date End Date Taking? Authorizing Provider  aspirin EC 81 MG EC tablet Take 1 tablet (81 mg total) by mouth daily. 05/16/15  Yes Rhonda G Barrett, PA-C  atorvastatin (LIPITOR) 80 MG tablet Take 1 tablet (80 mg total) by mouth daily at 6 PM. Patient taking differently: Take 40 mg by mouth daily at 6 PM.  05/16/15  Yes Rhonda G Barrett, PA-C  carvedilol (COREG) 6.25 MG tablet Take 1 tablet (6.25 mg total) by mouth 2 (two) times daily with a meal. 05/16/15  Yes Rhonda G Barrett, PA-C  gabapentin (NEURONTIN) 600 MG tablet Take 2 tablets (  1,200 mg total) by mouth at bedtime. Patient taking differently: Take 1,200 mg by mouth 2 (two) times daily.  03/19/15  Yes Linna HoffJames D Kindl, MD  insulin aspart (NOVOLOG) 100 UNIT/ML injection Inject 6 Units into the skin 3 (three) times daily before meals. 03/19/15  Yes Linna HoffJames D Kindl, MD  insulin detemir (LEVEMIR) 100 UNIT/ML injection Inject 0.42 mLs (42 Units total) into the skin at bedtime. 03/19/15  Yes Linna HoffJames D Kindl, MD  lisinopril-hydrochlorothiazide (PRINZIDE,ZESTORETIC) 20-12.5 MG per tablet Take 1 tablet by mouth daily. For blood pressure 03/19/15  Yes Linna HoffJames D Kindl, MD  metFORMIN (GLUMETZA) 1000 MG (MOD) 24 hr tablet Take 1,000 mg by mouth 2 (two) times  daily with a meal.   Yes Historical Provider, MD  nitroGLYCERIN (NITROSTAT) 0.4 MG SL tablet Place 1 tablet (0.4 mg total) under the tongue every 5 (five) minutes as needed for chest pain. 05/16/15  Yes Rhonda G Barrett, PA-C   BP 124/64 mmHg  Pulse 88  Temp(Src) 98.2 F (36.8 C) (Oral)  Resp 16  SpO2 100% Physical Exam  Constitutional: She appears well-developed.  HENT:  Head: Atraumatic.  Eyes: EOM are normal.  Neck: Neck supple.  Cardiovascular: Normal rate.   Pulmonary/Chest: Effort normal.  Abdominal: Soft.  Musculoskeletal: Normal range of motion.  Neurological: She is alert.  Skin: Skin is warm.    ED Course  Procedures (including critical care time) Labs Review Labs Reviewed  BASIC METABOLIC PANEL - Abnormal; Notable for the following:    Sodium 133 (*)    Chloride 99 (*)    CO2 21 (*)    Glucose, Bld 258 (*)    BUN 64 (*)    Creatinine, Ser 2.30 (*)    GFR calc non Af Amer 24 (*)    GFR calc Af Amer 27 (*)    All other components within normal limits  CBC - Abnormal; Notable for the following:    WBC 11.0 (*)    Hemoglobin 11.5 (*)    HCT 34.4 (*)    All other components within normal limits  CBG MONITORING, ED - Abnormal; Notable for the following:    Glucose-Capillary 247 (*)    All other components within normal limits  URINALYSIS, ROUTINE W REFLEX MICROSCOPIC (NOT AT [facility])  TROPONIN I  BASIC METABOLIC PANEL    Imaging Review No results found. I have personally reviewed and evaluated these images and lab results as part of my medical decision-making.   EKG Interpretation   Date/Time:  Wednesday March 04 2016 20:40:55 EDT Ventricular Rate:  93 PR Interval:  144 QRS Duration: 88 QT Interval:  368 QTC Calculation: 457 R Axis:   73 Text Interpretation:  Normal sinus rhythm Normal ECG Confirmed by  Rubin PayorPICKERING  MD, Harrold DonathNATHAN [phone] on 03/04/2016 10:18:54 PM      MDM   Final diagnoses:  AKI (acute kidney injury) (HCC)    Patient with  dizziness. Initially hypotensive. Improved with some IV fluids. Mild hyperglycemia. Creatinine has increased to 2.3 from a normal baseline. Will admit to internal medicine.  Benjiman CoreNathan Maebel Marasco, MD 03/05/16 [phone]

## 2016-03-04 NOTE — ED Notes (Signed)
Pt reports her dizziness has come back.

## 2016-03-04 NOTE — ED Notes (Signed)
Per pt daughter who arrived to ED and wanted to have additional information listed in patient chart- pt has been dizzy and nauseated for 3 days with irregular high CBGS 200-300s.CBGs normally in 100s. Pt also has headache.

## 2016-03-04 NOTE — ED Notes (Signed)
CBG is 247. 

## 2016-03-04 NOTE — H&P (Signed)
History and Physical    Rebecca [name] WUJ:[id number] DOB: 07/05/1966 DOA: 03/04/2016   PCP: [facility] Chief Complaint:  Chief Complaint  Patient presents with  . Headache  . Dizziness    HPI: Rebecca [name] is a 50 y.o. female with medical history significant of DM2 uncontrolled, HTN, ulcer on great toe of R foot, fracture of second toe of left foot, following with podiatry.  Patient presents to the ED with c/o 3 day history of headache, her BGL has been much higher than usual at home for past couple of days running 200s-300s (usually runs in 100s).  This is occurring despite taking her medications as she usually does.  ED Course: Work up demonstrates AKI.  Of note she has had nearly identical presentations with elevated (more than normal) BGLs and AKI in the past.  During previous admits for this she has improved with hydration, holding HCTZ/ACEi, and BGL control.  Review of Systems: As per HPI otherwise 10 point review of systems negative.    Past Medical History  Diagnosis Date  . Diabetes mellitus   . Hypertension   . Non-STEMI (non-ST elevated myocardial infarction) [facility](HCC)     Medical therapy for distal LAD dissection  . Dyslipidemia   . Anemia     Past Surgical History  Procedure Laterality Date  . Cardiac catheterization N/A 05/14/2015    Procedure: Left Heart Cath and Coronary Angiography;  Surgeon: Marykay Lexavid W Harding, MD; dLAD 60%>>95% (small vessel, ?dissection), CFX & RCA systems no sig dz, EF & LVEDP nl        reports that she has never smoked. She does not have any smokeless tobacco history on file. She reports that she does not drink alcohol or use illicit drugs.  No Active Allergies  Family History  Problem Relation Age of Onset  . Hypertension Father   . Hyperlipidemia Father   . Cancer Mother   . Diabetes Sister   . Diabetes Brother   . Diabetes Sister   . Diabetes Sister   . Diabetes Sister   . Diabetes Brother   .  Diabetes Brother   . Diabetes Brother   . Diabetes Brother      Prior to Admission medications   Medication Sig Start Date End Date Taking? Authorizing Provider  aspirin EC 81 MG EC tablet Take 1 tablet (81 mg total) by mouth daily. 05/16/15  Yes Rhonda G Barrett, PA-C  atorvastatin (LIPITOR) 80 MG tablet Take 1 tablet (80 mg total) by mouth daily at 6 PM. Patient taking differently: Take 40 mg by mouth daily at 6 PM.  05/16/15  Yes Rhonda G Barrett, PA-C  carvedilol (COREG) 6.25 MG tablet Take 1 tablet (6.25 mg total) by mouth 2 (two) times daily with a meal. 05/16/15  Yes Rhonda G Barrett, PA-C  gabapentin (NEURONTIN) 600 MG tablet Take 2 tablets (1,200 mg total) by mouth at bedtime. Patient taking differently: Take 1,200 mg by mouth 2 (two) times daily.  03/19/15  Yes Linna HoffJames D Kindl, MD  insulin aspart (NOVOLOG) 100 UNIT/ML injection Inject 6 Units into the skin 3 (three) times daily before meals. 03/19/15  Yes Linna HoffJames D Kindl, MD  insulin detemir (LEVEMIR) 100 UNIT/ML injection Inject 0.42 mLs (42 Units total) into the skin at bedtime. 03/19/15  Yes Linna HoffJames D Kindl, MD  lisinopril-hydrochlorothiazide (PRINZIDE,ZESTORETIC) 20-12.5 MG per tablet Take 1 tablet by mouth daily. For blood pressure 03/19/15  Yes Linna HoffJames D Kindl, MD  metFORMIN Elmon Kirschner(GLUMETZA)  1000 MG (MOD) 24 hr tablet Take 1,000 mg by mouth 2 (two) times daily with a meal.   Yes Historical Provider, MD  nitroGLYCERIN (NITROSTAT) 0.4 MG SL tablet Place 1 tablet (0.4 mg total) under the tongue every 5 (five) minutes as needed for chest pain. 05/16/15  Yes Darrol Jump, PA-C    Physical Exam: Filed Vitals:   03/04/16 2200 03/04/16 2215 03/04/16 2230 03/04/16 2245  BP: 115/72 129/75 120/66 124/64  Pulse: 88 89 87 88  Temp:      TempSrc:      Resp:      SpO2: 100% 100% 100% 100%      Constitutional: NAD, calm, comfortable Eyes: PERRL, lids and conjunctivae normal ENMT: Mucous membranes are moist. Posterior pharynx clear of any exudate or  lesions.Normal dentition.  Neck: normal, supple, no masses, no thyromegaly Respiratory: clear to auscultation bilaterally, no wheezing, no crackles. Normal respiratory effort. No accessory muscle use.  Cardiovascular: Regular rate and rhythm, no murmurs / rubs / gallops. No extremity edema. 2+ pedal pulses. No carotid bruits.  Abdomen: no tenderness, no masses palpated. No hepatosplenomegaly. Bowel sounds positive.  Musculoskeletal: no clubbing / cyanosis. No joint deformity upper and lower extremities. Good ROM, no contractures. Normal muscle tone.  Skin: Dressing over R great toe is C/D/I, no surrounding erythema, drainage, etc Neurologic: CN 2-12 grossly intact. Sensation intact, DTR normal. Strength 5/5 in all 4.  Psychiatric: Normal judgment and insight. Alert and oriented x 3. Normal mood.    Labs on Admission: I have personally reviewed following labs and imaging studies  CBC:  Recent Labs Lab 03/04/16 2148  WBC 11.0*  HGB 11.5*  HCT 34.4*  MCV 78.7  PLT 299   Basic Metabolic Panel:  Recent Labs Lab 03/04/16 2148  NA 133*  K 4.4  CL 99*  CO2 21*  GLUCOSE 258*  BUN 64*  CREATININE 2.30*  CALCIUM 9.5   GFR: CrCl cannot be calculated (Unknown ideal weight.). Liver Function Tests: No results for input(s): AST, ALT, ALKPHOS, BILITOT, PROT, ALBUMIN in the last 168 hours. No results for input(s): LIPASE, AMYLASE in the last 168 hours. No results for input(s): AMMONIA in the last 168 hours. Coagulation Profile: No results for input(s): INR, PROTIME in the last 168 hours. Cardiac Enzymes: No results for input(s): CKTOTAL, CKMB, CKMBINDEX, TROPONINI in the last 168 hours. BNP (last 3 results) No results for input(s): PROBNP in the last 8760 hours. HbA1C: No results for input(s): HGBA1C in the last 72 hours. CBG:  Recent Labs Lab 03/04/16 2043  GLUCAP 247*   Lipid Profile: No results for input(s): CHOL, HDL, LDLCALC, TRIG, CHOLHDL, LDLDIRECT in the last 72  hours. Thyroid Function Tests: No results for input(s): TSH, T4TOTAL, FREET4, T3FREE, THYROIDAB in the last 72 hours. Anemia Panel: No results for input(s): VITAMINB12, FOLATE, FERRITIN, TIBC, IRON, RETICCTPCT in the last 72 hours. Urine analysis:    Component Value Date/Time   COLORURINE YELLOW 05/13/2015 0411   APPEARANCEUR CLEAR 05/13/2015 0411   LABSPEC 1.011 05/13/2015 0411   PHURINE 5.0 05/13/2015 0411   GLUCOSEU 500* 05/13/2015 0411   HGBUR SMALL* 05/13/2015 0411   BILIRUBINUR NEGATIVE 05/13/2015 0411   KETONESUR NEGATIVE 05/13/2015 0411   PROTEINUR NEGATIVE 05/13/2015 0411   UROBILINOGEN 0.2 05/13/2015 0411   NITRITE NEGATIVE 05/13/2015 0411   LEUKOCYTESUR NEGATIVE 05/13/2015 0411   Sepsis Labs: @LABRCNTIP (procalcitonin:4,lacticidven:4) )No results found for this or any previous visit (from the past 240 hour(s)).   Radiological Exams on  Admission: No results found.  EKG: Independently reviewed.  Assessment/Plan Principal Problem:   AKI (acute kidney injury) (HCC) Active Problems:   Acute kidney injury (HCC)   DM (diabetes mellitus), type 2, uncontrolled (HCC)   Essential hypertension   AKI - probably due to dehydration due to uncontrolled BPs, and nephrotoxicity of HCTZ/lisinopril  Hold HCTZ / lisinopril  IVF  Repeat BMP in AM  DM2 -  Continue levemir 42  SSI moderate dose AC  Diabetes coordinator consult due to uncontrolled DM  HTN - continue coreg, holding lisinopril / HCTZ   DVT prophylaxis: Heparin [place] Code Status: Full Family Communication: Family at bedside Consults called: Admit to  Admission status: Admit to inpatient   Hillary Bow DO [facility] Pager [phone] from 7PM-7AM  If 7AM-7PM, please contact the day physician for the patient [email or web address] Password [email or web address]  03/04/2016, 11:45 PM

## 2016-03-04 NOTE — ED Notes (Signed)
C/o headache x 3 days.  Denies nausea and vomiting.  States she hasn't taken any medication for headache.  No neuro deficits on triage exam.

## 2016-03-05 DIAGNOSIS — I10 Essential (primary) hypertension: Secondary | ICD-10-CM

## 2016-03-05 DIAGNOSIS — N179 Acute kidney failure, unspecified: Principal | ICD-10-CM

## 2016-03-05 DIAGNOSIS — E1165 Type 2 diabetes mellitus with hyperglycemia: Secondary | ICD-10-CM

## 2016-03-05 DIAGNOSIS — N183 Chronic kidney disease, stage 3 (moderate): Secondary | ICD-10-CM

## 2016-03-05 DIAGNOSIS — Z794 Long term (current) use of insulin: Secondary | ICD-10-CM

## 2016-03-05 DIAGNOSIS — E1122 Type 2 diabetes mellitus with diabetic chronic kidney disease: Secondary | ICD-10-CM

## 2016-03-05 DIAGNOSIS — E1349 Other specified diabetes mellitus with other diabetic neurological complication: Secondary | ICD-10-CM

## 2016-03-05 LAB — GLUCOSE, CAPILLARY
Glucose-Capillary: 258 mg/dL — ABNORMAL HIGH (ref 65–99)
Glucose-Capillary: 230 mg/dL — ABNORMAL HIGH (ref 65–99)
Glucose-Capillary: 279 mg/dL — ABNORMAL HIGH (ref 65–99)

## 2016-03-05 LAB — URINALYSIS, ROUTINE W REFLEX MICROSCOPIC
Bilirubin Urine: NEGATIVE
Glucose, UA: 500 mg/dL — AB
Hgb urine dipstick: NEGATIVE
KETONES UR: NEGATIVE mg/dL
NITRITE: NEGATIVE
PROTEIN: NEGATIVE mg/dL
Specific Gravity, Urine: 1.017 (ref 1.005–1.030)
pH: 5.5 (ref 5.0–8.0)

## 2016-03-05 LAB — URINE MICROSCOPIC-ADD ON

## 2016-03-05 LAB — BASIC METABOLIC PANEL
ANION GAP: 8 (ref 5–15)
BUN: 52 mg/dL — ABNORMAL HIGH (ref 6–20)
CO2: 23 mmol/L (ref 22–32)
Calcium: 8.7 mg/dL — ABNORMAL LOW (ref 8.9–10.3)
Chloride: 105 mmol/L (ref 101–111)
Creatinine, Ser: 1.39 mg/dL — ABNORMAL HIGH (ref 0.44–1.00)
GFR calc Af Amer: 50 mL/min — ABNORMAL LOW (ref >60)
GFR, EST NON AFRICAN AMERICAN: 43 mL/min — AB (ref >60)
GLUCOSE: 250 mg/dL — AB (ref 65–99)
POTASSIUM: 4.1 mmol/L (ref 3.5–5.1)
SODIUM: 136 mmol/L (ref 135–145)

## 2016-03-05 LAB — TROPONIN I: Troponin I: <0.03 ng/mL (ref <0.031)

## 2016-03-05 MED ORDER — INSULIN ASPART 100 UNIT/ML ~~LOC~~ SOLN: 6.0000 [IU] | Freq: Three times a day (TID) | SUBCUTANEOUS | Status: DC

## 2016-03-05 MED ORDER — KETOROLAC TROMETHAMINE 30 MG/ML IJ SOLN
30.0000 mg | Freq: Once | INTRAMUSCULAR | Status: AC
  Administered 2016-03-05: 30 mg via INTRAVENOUS
  Filled 2016-03-05: qty 1

## 2016-03-05 NOTE — Progress Notes (Signed)
Patient was discharged home by MD order; discharged instructions  review and give to patient and her daughter with care notes; IV DIC; patient will be escorted to the car by a volunteer via wheelchair.  

## 2016-03-05 NOTE — Discharge Instructions (Signed)
Lesin renal aguda (Acute Kidney Injury) La lesin renal aguda es cualquier afeccin en la que haya un dao repentino (agudo) en los riones. La lesin renal aguda anteriormente se conoca como insuficiencia renal aguda. Los riones son dos rganos que se encuentran a cada lado de la columna vertebral entre el centro de la espalda y la parte de adelante del abdomen. Los riones:  Hughes SupplyEliminan los desechos y el exceso de agua de la [place].  Producen hormonas importantes. Ayudan a Colgatemantener los huesos fuertes, a regular la presin arterial y a formar glbulos rojos.  Regulan los lquidos y los productos qumicos en la sangre y los tejidos. Un dao pequeo puede no causar problemas, pero un dao mayor puede impedir que los riones funcionen de la [place] adecuada. La lesin renal aguda puede progresar y causar una enfermedad renal crnica. Tambin puede dar lugar a una enfermedad potencialmente mortal llamada enfermedad renal en etapa terminal. La lesin renal aguda puede empeorar muy rpidamente, por lo que debe tratarse de inmediato. El tratamiento inicial puede prevenir el desarrollo de otras enfermedades renales. CAUSAS   Problemas con el flujo sanguneo a los riones. Las DTE Energy Companycausas pueden ser las siguientes:  Prdida de [place].  Enfermedades cardacas.  Quemaduras graves.  Enfermedad heptica.  Lesin directa en el rin. Las causas pueden ser las siguientes:  Algunos medicamentos.  Infeccin renal.  Intoxicacin o consumo de sustancias txicas.  Una herida quirrgica.  Golpe en la zona renal.  Problemas en el flujo de orina. Las causas pueden ser las siguientes:  Database administratorCncer.  Clculos en el rin.  Aumento del tamao de la prstata. SIGNOS Y SNTOMAS   Hinchazn (edema) de las piernas, los tobillos o los pies.  Cansancio (letargo).  Nuseas o vmitos.  Confusin.  Problemas en la miccin, tales como:  Sensacin de Engineer, miningdolor o ardor al Geographical information systems officerorinar.  Disminucin de la produccin de  [place].  Accidentes frecuentes en los nios que saben ir al bao solos (controlan los esfnteres).  Sangre en la orina.  Contracciones o calambres musculares.  Falta de aire.  Convulsiones.  Dolor u opresin en el pecho En algunos casos no hay sntomas.  DIAGNSTICO La lesin renal aguda se Arboriculturistpuede detectar y 617 Libertydiagnosticar mediante estudios, que incluyen anlisis de [place]re o de [place], diagnstico por imgenes o una biopsia de rin.  TRATAMIENTO El tratamiento de la lesin renal aguda vara segn la causa y la gravedad del dao renal. En los casos leves, puede no ser necesario el Hodgentratamiento. Los riones pueden curarse por s solos. Si la lesin renal aguda es ms grave, el mdico tratar la causa, le dar un tratamiento para reparar los riones y Automotive engineerevitar las complicaciones. Los Liz Claibornecasos graves pueden requerir un procedimiento para Halliburton Companyeliminar los desechos txicos del cuerpo (dilisis) o una ciruga para reparar el dao renal. Karen KaysLa ciruga consiste en lo siguiente:   Reparar un rin daado.  Eliminar una obstruccin. INSTRUCCIONES PARA EL CUIDADO EN EL HOGAR  Siga la dieta que le han indicado.  Tome los medicamentos solamente como se lo haya indicado el mdico.  No utilice ningn medicamento nuevo (recetado, de venta libre o suplementos nutricionales), excepto si el mdico lo aprueba. Muchos medicamentos pueden empeorar la enfermedad renal o necesitar un ajuste de la dosis.  Concurra a todas las visitas de control como se lo haya indicado el mdico. Esto es importante.  Controle la afeccin para asegurarse de que est evolucionando segn lo previsto. SOLICITE ATENCIN MDICA DE INMEDIATO SI:  Se siente enfermo o tiene  un dolor intenso en el costado del cuerpo o en la espalda.  Los sntomas regresan o tiene sntomas nuevos.  Tiene cualquiera de los sntomas de la enfermedad renal en etapa terminal. Estos incluyen los siguientes:  Picazn persistente.  Prdida del apetito.  Dolores de  [place].  La piel se oscurece o se aclara de manera anormal.  Entumecimiento en las manos o en los pies.  Aparecen hematomas con facilidad.  Hipo frecuente.  Se detiene la menstruacin.  Tiene fiebre.  Tiene mayor produccin de [place].  Siente dolor o tiene sangrado al [employer]oco[employer]. ASEGRESE DE QUE:   Comprende estas instrucciones.  Controlar su afeccin.  Recibir ayuda de inmediato si no mejora o si empeora.   Esta informacin no tiene Theme park manager el consejo del mdico. Asegrese de hacerle al mdico cualquier pregunta que tenga.   Document Released: 08/24/2012 Document Revised: 09/28/2014 Elsevier Interactive Patient Education 2016 [employer].  Enfermedad renal crnica (Chronic Kidney Disease) La enfermedad renal crnica se produce cuando los riones sufren un dao durante un largo perodo. Los riones son dos rganos que desempean muchas funciones importantes en el organismo. Estas funciones incluyen lo siguiente:  Eliminar desechos y el exceso de lquido de la [place].  Fabricar hormonas que ayudan a Engineer, maintenance (IT).  Asegurarse de que el organismo tenga la cantidad correcta de lquidos y de sustancias qumicas. La enfermedad renal crnica puede deberse a muchos factores. El dao renal se produce lentamente. Si el dao es Ida, los riones pueden dejar de funcionar como es debido. Esto es peligroso. El tratamiento puede ayudar a Paediatric nurse avance del dao y Automotive engineer que [place]. CUIDADOS EN EL HOGAR  Siga la dieta segn las indicaciones de su mdico. Tal vez deba limitar la cantidad de sal (sodio) y de protenas que consume a diario.  Tome los medicamentos solamente como se lo haya indicado el mdico. No tome ningn medicamento nuevo, excepto que el mdico lo autorice.  Si fuma, deje de hacerlo. Pregntele al Enterprise Products programas que pueden ayudarlo de dejar el hbito.  Contrlese con frecuencia presin arterial y Audiological scientist un registro  de los [employer].  Comience o contine con un plan de ejercicios.  Aplquese las vacunas como se lo haya indicado el mdico.  Tome las vitaminas y los minerales como se lo haya indicado el mdico.  Oceanographer a todas las visitas de control como se lo haya indicado el mdico. Esto es importante. SOLICITE AYUDA DE INMEDIATO SI:   Los sntomas empeoran.  Aparecen nuevos sntomas.  Tiene los sntomas de la enfermedad renal en etapa terminal. Estos incluyen los siguientes:  Dolores de [place].  La piel ms oscura o ms clara que lo normal.  Entumecimiento en las manos o en los pies.  Aparecen hematomas con facilidad.  Hipo frecuente.  Las mujeres pueden dejar de Armed forces training and education officer.  Tiene fiebre.  Orina muy poco.  Siente dolor o tiene una hemorragia al Geographical information systems officer.   Esta informacin no tiene Theme park manager el consejo del mdico. Asegrese de hacerle al mdico cualquier pregunta que tenga.   Document Released: 10/10/2010 Document Revised: 05/29/2015 Elsevier Interactive Patient Education [employer]

## 2016-03-05 NOTE — Discharge Summary (Signed)
Physician Discharge Summary  Rebecca [name] [id number] DOB: 09/15/1966 DOA: 03/04/2016  PCP: [facility]  Admit date: 03/04/2016 Discharge date: 03/05/2016  Recommendations for Outpatient Follow-up:  Stop metformin due to renal insufficiency. May continue prinzide as your creatinine is at baseline but please follow up with PCP and recheck kidney function to make sure it is stable. Continue current insulin regimen.  Discharge Diagnoses:  Principal Problem:   Acute renal failure superimposed on CKD stage 3 Active Problems:   DM (diabetes mellitus), type 2, uncontrolled (HCC)   Essential hypertension   Toe ulcer, right (HCC)   Toe fracture    Discharge Condition: stable   Diet recommendation: as tolerated   History of present illness:   Per HPI  "50 y.o. female with medical history significant of DM2 uncontrolled, HTN, ulcer on great toe of R foot, fracture of second toe of left foot, following with podiatry. Patient presents to the ED with c/o 3 day history of headache, her BGL has been much higher than usual at home for past couple of days running 200s-300s (usually runs in 100s). This is occurring despite taking her medications as she usually does. ED Course: Work up demonstrates AKI. Of note she has had nearly identical presentations with elevated (more than normal) BGLs and AKI in the past. 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) -  A1c 13.5 about 1 year ago - Resume insulin regime per home dose - Stop metformin due to worsening renal function on admission    Essential hypertension - Resume Prinzide - Pt instructed to follow up with PCP to recheck kidney function and make sure it is stable      Diabetic neuropathy - Continue gabapentin    Signed:  Manson PasseyEVINE, Deontrey Massi, MD  [facility] 03/05/2016, 10:15 AM  Pager #: [phone]  Time spent in minutes: more than 30 minutes  Procedures:  None   Consultations:  None   Discharge Exam: Filed Vitals:   03/05/16 0103 03/05/16 0525  BP: 156/88 150/77  Pulse: 91 87  Temp: 97.7 F (36.5 C) 97.6 F (36.4 C)  Resp: 18 19   Filed Vitals:   03/05/16 0047 03/05/16 0103 03/05/16 0107 03/05/16 0525  BP: 155/62 156/88  150/77  Pulse: 92 91  87  Temp:  97.7 F (36.5 C)  97.6 F (36.4 C)  TempSrc:  Oral  Oral  Resp: 18 18  19   Height:  5\' 3"  (1.6 m)    Weight:  74.299 kg (163 lb 12.8 oz) 74.208 kg (163 lb 9.6 oz)   SpO2: 100% 100%  100%    General: Pt is alert, follows commands appropriately, not in acute distress Cardiovascular: Regular rate and rhythm, S1/S2 +, no murmurs Respiratory: Clear to auscultation bilaterally, no wheezing, no crackles, no rhonchi Abdominal: Soft, non tender, non distended, bowel sounds +, no guarding Extremities: no edema, no cyanosis, pulses palpable bilaterally DP and PT Neuro: Grossly nonfocal  Discharge Instructions  Discharge Instructions    Call MD for:  difficulty breathing, headache or visual disturbances    Complete by:  As directed      Call MD for:  persistant dizziness or light-headedness    Complete by:  As directed      Call MD for:  persistant nausea and vomiting    Complete by:  As directed      Call MD for:  severe uncontrolled pain    Complete by:  As directed      Diet - low sodium heart healthy    Complete by:  As directed      Discharge instructions    Complete by:  As directed   Stop metformin  due to renal insufficiency. May continue prinzide as your creatinine is at baseline but please follow up with PCP and recheck kidney function to make sure it is stable. Continue current insulin regimen.     Increase activity slowly    Complete by:  As directed             Medication List    STOP taking these medications        metFORMIN 1000 MG (MOD) 24 hr tablet  Commonly known as:  GLUMETZA      TAKE these medications        aspirin 81 MG EC tablet  Take 1 tablet (81 mg total) by mouth daily.     atorvastatin 80 MG tablet  Commonly known as:  LIPITOR  Take 1 tablet (80 mg total) by mouth daily at 6 PM.     carvedilol 6.25 MG tablet  Commonly known as:  COREG  Take 1 tablet (6.25 mg total) by mouth 2 (two) times daily with a meal.     gabapentin 600 MG tablet  Commonly known as:  NEURONTIN  Take 2 tablets (1,200 mg total) by mouth at bedtime.     insulin aspart 100 UNIT/ML injection  Commonly known as:  NOVOLOG  Inject 6 Units into the skin 3 (three) times daily before meals.     insulin detemir 100 UNIT/ML injection  Commonly known as:  LEVEMIR  Inject 0.42 mLs (42 Units total) into the skin at bedtime.     lisinopril-hydrochlorothiazide 20-12.5 MG tablet  Commonly known as:  PRINZIDE,ZESTORETIC  Take 1 tablet by mouth daily. For blood pressure     nitroGLYCERIN 0.4 MG SL tablet  Commonly known as:  NITROSTAT  Place 1 tablet (0.4 mg total) under the tongue every 5 (five) minutes as needed for chest pain.           Follow-up Information    Follow up with [facility]. Schedule an appointment as soon as possible for a visit in 1 week.   Why:  Follow up appt after recent hospitalization   Contact information:   [address] [place] [address] [phone]        The results of significant diagnostics from this hospitalization (including imaging, microbiology, ancillary and laboratory) are listed below for reference.     Significant Diagnostic Studies: No results found.  Microbiology: No results found for this or any previous visit (from the past 240 hour(s)).   Labs: Basic Metabolic Panel:  Recent Labs Lab 03/04/16 2148 03/05/16 0543  NA 133* 136  K 4.4 4.1  CL 99* 105  CO2 21* 23  GLUCOSE 258* 250*  BUN 64* 52*  CREATININE 2.30* 1.39*  CALCIUM 9.5 8.7*   Liver  Function Tests: No results for input(s): AST, ALT, ALKPHOS, BILITOT, PROT, ALBUMIN in the last 168 hours. No results for input(s): LIPASE, AMYLASE in the last 168 hours. No results for input(s): AMMONIA in the last 168 hours. CBC:  Recent Labs Lab 03/04/16 2148  WBC 11.0*  HGB 11.5*  HCT 34.4*  MCV 78.7  PLT 299   Cardiac Enzymes:  Recent Labs Lab 03/04/16 2348  TROPONINI <0.03   BNP: BNP (last 3 results) No results for input(s): BNP in the last 8760 hours.  ProBNP (last 3 results) No results for input(s): PROBNP in the last 8760 hours.  CBG:  Recent Labs Lab 03/04/16 2043 03/05/16 0105 03/05/16 0754  GLUCAP 247* 258* 230*

## 2016-03-05 NOTE — Progress Notes (Addendum)
Spoke with patient and her daughter (daughter translated) about diabetes and home regimen for diabetes control. Patient reports that she is followed by PCP for diabetes management and currently she takes Levemir 42 units QHS, Novolog 6 units TID with meals, and Glumetz 1000 mg BID as an outpatient for diabetes control. Patient reports that she is taking DM meds as prescribed. Patient reports that no changes were made with her DM medications at her last office visit. Patient states that she checks her glucose 2 times per day and that it is usually in the 200-400's mg/dl.  Discussed glucose and A1C goals. Discussed importance of checking CBGs and maintaining good CBG control to prevent long-term and short-term complications. Explained how hyperglycemia leads to damage within blood vessels which lead to the common complications seen with uncontrolled diabetes. Stressed to the patient the importance of improving glycemic control to prevent further complications from uncontrolled diabetes. Discussed impact of nutrition, exercise, stress, sickness, and medications on diabetes control. Explained to the patient that she needs to check her glucose more frequently 3-4 times per day so that her doctor will have more information to use to make adjustments to her DM medications. Patient stated that she currently has on a patch on her right upper arm which is suppose to be checking her glucose  (continuous glucose monitoring) and she is going to have it read by her doctor when she goes back. Since she has had on the patch patient reports that she has not been checking glucose as much. Even though patient has on continuous glucose patch, encouraged patient to still check her glucose 4 times per day (before meals and at bedtime) and to keep a log book of glucose readings and insulin taken which she will need to take to doctor appointments. Explained how the doctor she follows up with can use the log book to continue to make  insulin adjustments if needed. Patient verbalized understanding of information discussed and she states that she has no further questions at this time related to diabetes.  Thanks, Orlando PennerMarie Melanye Hiraldo, RN, MSN, CDE Diabetes Coordinator Inpatient Diabetes Program [phone] (Team Pager) [phone] ([place] office) [phone] [facility] office) [phone] [facility] office)

## 2016-03-05 NOTE — Care Management Note (Signed)
Case Management Note  Patient Details  Name: Lenor Derricksidora I Flores MRN: [id number] Date of Birth: 07/01/1966  Subjective/Objective:                 Independent patient from home who gets help from family as needed. Patient admitted for one night of IVF for AKI.    Action/Plan:  DC to home. Self Care Expected Discharge Date:                  Expected Discharge Plan:  Home/Self Care  In-House Referral:     Discharge planning Services  CM Consult  Post Acute Care Choice:  NA Choice offered to:     DME Arranged:    DME Agency:     HH Arranged:    HH Agency:     Status of Service:  Completed, signed off  Medicare Important Message Given:    Date Medicare IM Given:    Medicare IM give by:    Date Additional Medicare IM Given:    Additional Medicare Important Message give by:     If discussed at Long Length of Stay Meetings, dates discussed:    Additional Comments:  Lawerance SabalDebbie Alahna Dunne, RN 03/05/2016, 11:02 AM

## 2016-03-16 ENCOUNTER — Ambulatory Visit: Payer: No Typology Code available for payment source | Admitting: Sports Medicine

## 2016-03-16 ENCOUNTER — Telehealth: Payer: Self-pay | Admitting: *Deleted

## 2016-03-16 ENCOUNTER — Encounter: Payer: Self-pay | Admitting: Sports Medicine

## 2016-03-16 ENCOUNTER — Ambulatory Visit: Payer: Self-pay

## 2016-03-16 DIAGNOSIS — M79672 Pain in left foot: Secondary | ICD-10-CM

## 2016-03-16 DIAGNOSIS — S92912K Unspecified fracture of left toe(s), subsequent encounter for fracture with nonunion: Secondary | ICD-10-CM

## 2016-03-16 DIAGNOSIS — L97511 Non-pressure chronic ulcer of other part of right foot limited to breakdown of skin: Secondary | ICD-10-CM

## 2016-03-16 DIAGNOSIS — L89891 Pressure ulcer of other site, stage 1: Secondary | ICD-10-CM

## 2016-03-16 DIAGNOSIS — E1142 Type 2 diabetes mellitus with diabetic polyneuropathy: Secondary | ICD-10-CM

## 2016-03-16 NOTE — Telephone Encounter (Addendum)
-----   Message from Asencion Islamitorya Stover, [place] sent at 03/16/2016  3:26 PM EDT ----- Regarding: Bone stim Hi Jodye Scali Can you see if any of our bone stim companies (exogen or orthofix) can do a charity case, patient has a left 2nd toe fracture that has not healed >4 months. Patient has no insurance and is [facility] Thanks  Dr Marylene LandStover. Spoke with Thurston Poundsrey - [employer] and he said let's get this pt taken care of. [facility] clinical[facility] 03[facility]16/2016 [facility]. 02/26/2016-03/16/2016 [facility]

## 2016-03-16 NOTE — Progress Notes (Signed)
Patient ID: Rebecca [name], female   DOB: 04-28-1966, 50 y.o.   MRN: [id number]  Subjective: Rebecca [name] is a 50 y.o. Diabetic female patient seen in office for evaluation of ulceration of the right big toe and evaluation of healing of fracture at Left 2nd toe.  Patient has been wearing post op shoe on left since after being seeing by Dr. Jacqualyn Posey on 11-05-15 for toe fracture and is changing the dressing on right big toe using neosporin and bandaid. Denies nausea/fever/vomiting/chills/night sweats/shortness of breath/pain. Patient has no other pedal complaints at this time.   Patient Active Problem List   Diagnosis Date Noted  . AKI (acute kidney injury) ([place]) 03/04/2016  . Toe ulcer, right ([place]) 11/07/2015  . Toe fracture 11/07/2015  . Essential hypertension 05/14/2015  . CAD- with distal LAD disease, ? 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 05/14/2015  . NSTEMI (non-ST elevated myocardial infarction) ([place])   . Syncope 05/13/2015  . Hyperglycemia 10/25/2014  . Abrasion of thigh, left 10/25/2014  . DM (diabetes mellitus), type 2, uncontrolled ([place])   . DM hyperosmolar coma, type 2 ([place]) 05/22/2012  . Hyperosmolar syndrome 05/21/2012   Current Outpatient Prescriptions on File Prior to Visit  Medication Sig Dispense Refill  . aspirin EC 81 MG EC tablet Take 1 tablet (81 mg total) by mouth daily.    Marland Kitchen atorvastatin (LIPITOR) 80 MG tablet Take 1 tablet (80 mg total) by mouth daily at 6 PM. (Patient taking differently: Take 40 mg by mouth daily at 6 PM. ) 30 tablet 11  . carvedilol (COREG) 6.25 MG tablet Take 1 tablet (6.25 mg total) by mouth 2 (two) times daily with a meal. 60 tablet 11  . gabapentin (NEURONTIN) 600 MG tablet Take 2 tablets (1,200 mg total) by mouth at bedtime. (Patient taking differently: Take 1,200 mg by mouth 2 (two) times daily. ) 30 tablet 1  . insulin aspart (NOVOLOG) 100 UNIT/ML injection Inject 6 Units into the skin 3 (three) times daily before meals. 10 mL 1  . insulin detemir (LEVEMIR)  100 UNIT/ML injection Inject 0.42 mLs (42 Units total) into the skin at bedtime. 10 mL 1  . lisinopril-hydrochlorothiazide (PRINZIDE,ZESTORETIC) 20-12.5 MG per tablet Take 1 tablet by mouth daily. For blood pressure 30 tablet 0  . nitroGLYCERIN (NITROSTAT) 0.4 MG SL tablet Place 1 tablet (0.4 mg total) under the tongue every 5 (five) minutes as needed for chest pain. 25 tablet 3   No current facility-administered medications on file prior to visit.   No Active Allergies  Recent Results (from the past 2160 hour(s))  CBG monitoring, ED     Status: Abnormal   Collection Time: 03/04/16  8:43 PM  Result Value Ref Range   Glucose-Capillary 247 (H) 65 - 99 mg/dL  Basic metabolic panel     Status: Abnormal   Collection Time: 03/04/16  9:48 PM  Result Value Ref Range   Sodium 133 (L) 135 - 145 mmol/L   Potassium 4.4 3.5 - 5.1 mmol/L   Chloride 99 (L) 101 - 111 mmol/L   CO2 21 (L) 22 - 32 mmol/L   Glucose, Bld 258 (H) 65 - 99 mg/dL   BUN 64 (H) 6 - 20 mg/dL   Creatinine, Ser 2.30 (H) 0.44 - 1.00 mg/dL   Calcium 9.5 8.9 - 10.3 mg/dL   GFR calc non Af Amer 24 (L) >60 mL/min   GFR calc Af Amer 27 (L) >60 mL/min    Comment: (NOTE) The eGFR has been calculated using the  CKD EPI equation. This calculation has not been validated in all clinical situations. eGFR's persistently <60 mL/min signify possible Chronic Kidney Disease.    Anion gap 13 5 - 15  CBC     Status: Abnormal   Collection Time: 03/04/16  9:48 PM  Result Value Ref Range   WBC 11.0 (H) 4.0 - 10.5 K/uL   RBC 4.37 3.87 - 5.11 MIL/uL   Hemoglobin 11.5 (L) 12.0 - 15.0 g/dL   HCT 34.4 (L) 36.0 - 46.0 %   MCV 78.7 78.0 - 100.0 fL   MCH 26.3 26.0 - 34.0 pg   MCHC 33.4 30.0 - 36.0 g/dL   RDW 13.4 11.5 - 15.5 %   Platelets 299 150 - 400 K/uL  Troponin I     Status: None   Collection Time: 03/04/16 11:48 PM  Result Value Ref Range   Troponin I <0.03 <0.031 ng/mL    Comment:        NO INDICATION OF MYOCARDIAL INJURY.   Glucose,  capillary     Status: Abnormal   Collection Time: 03/05/16  1:05 AM  Result Value Ref Range   Glucose-Capillary 258 (H) 65 - 99 mg/dL  Urinalysis, Routine w reflex microscopic     Status: Abnormal   Collection Time: 03/05/16  4:57 AM  Result Value Ref Range   Color, Urine YELLOW YELLOW   APPearance CLEAR CLEAR   Specific Gravity, Urine 1.017 1.005 - 1.030   pH 5.5 5.0 - 8.0   Glucose, UA 500 (A) NEGATIVE mg/dL   Hgb urine dipstick NEGATIVE NEGATIVE   Bilirubin Urine NEGATIVE NEGATIVE   Ketones, ur NEGATIVE NEGATIVE mg/dL   Protein, ur NEGATIVE NEGATIVE mg/dL   Nitrite NEGATIVE NEGATIVE   Leukocytes, UA TRACE (A) NEGATIVE  Urine microscopic-add on     Status: Abnormal   Collection Time: 03/05/16  4:57 AM  Result Value Ref Range   Squamous Epithelial / LPF 0-5 (A) NONE SEEN   WBC, UA 0-5 0 - 5 WBC/hpf   RBC / HPF 0-5 0 - 5 RBC/hpf   Bacteria, UA FEW (A) NONE SEEN   Casts HYALINE CASTS (A) NEGATIVE   Urine-Other YEAST PRESENT   Basic metabolic panel     Status: Abnormal   Collection Time: 03/05/16  5:43 AM  Result Value Ref Range   Sodium 136 135 - 145 mmol/L   Potassium 4.1 3.5 - 5.1 mmol/L   Chloride 105 101 - 111 mmol/L   CO2 23 22 - 32 mmol/L   Glucose, Bld 250 (H) 65 - 99 mg/dL   BUN 52 (H) 6 - 20 mg/dL   Creatinine, Ser 1.39 (H) 0.44 - 1.00 mg/dL   Calcium 8.7 (L) 8.9 - 10.3 mg/dL   GFR calc non Af Amer 43 (L) >60 mL/min   GFR calc Af Amer 50 (L) >60 mL/min    Comment: (NOTE) The eGFR has been calculated using the CKD EPI equation. This calculation has not been validated in all clinical situations. eGFR's persistently <60 mL/min signify possible Chronic Kidney Disease.    Anion gap 8 5 - 15  Glucose, capillary     Status: Abnormal   Collection Time: 03/05/16  7:54 AM  Result Value Ref Range   Glucose-Capillary 230 (H) 65 - 99 mg/dL   Comment 1 Notify RN   Glucose, capillary     Status: Abnormal   Collection Time: 03/05/16 11:46 AM  Result Value Ref Range    Glucose-Capillary 279 (H) 65 -  99 mg/dL   Comment 1 Notify RN     Objective: There were no vitals filed for this visit.  General: Patient is awake, alert, oriented x 3 and in no acute distress.  Dermatology: Skin is warm and dry bilateral with a partial thickness ulceration present Right plantar medial hallux. Ulceration measures 0.3cm x 0.2 cm x 0.1 cm (last measurement 2cm x 0.3 cm x 0.1 cm) post debridement . There is a keratotic border with a granular base. The ulceration does not probe to bone. There is no malodor, no active drainage, no erythema, no edema. No acute signs of infection. Bilateral hallux nails are asymptomatic today, short, thickened with trauma lines and subungal debris, all other nails within normal limits.    Vascular: Dorsalis Pedis pulse = 2/4 Bilateral,  Posterior Tibial pulse = 2/4 Bilateral,  Capillary Fill Time < 5 seconds  Neurologic: Protective sensation diminished bilateral using 5.07/10g Semmes Weinstein Monofilament.  Musculosketal: There is no pain to left 2nd toe fracture site with hammertoe. No Pain with palpation to ulcerated area at right hallux. No pain with compression to calves bilateral.  Xray, left foot: Consistent with fracture at base of left 2nd toe with visible fracture line of 59m, comunition, extra-articular, No other acute findings.    Assessment and Plan:  Problem List Items Addressed This Visit      Musculoskeletal and Integument   Toe ulcer, right ([facility])   Toe fracture    Other Visit Diagnoses    Left foot pain    -  Primary    Relevant Orders    DG Foot Complete Left    Diabetic polyneuropathy associated with type 2 diabetes mellitus ([place]          -Examined patient  -Discussed the progression of the wound and treatment alternatives for right hallux  -Excisionally dedbrided ulceration at right hallux to healthy bleeding borders using a sterile chisel blade. -Applied topical antibiotic cream, offloading pads and dry sterile  dressing and instructed patient to continue with daily dressings at home consisting of neosporin and bandaid/dry sterile dressing. - Advised patient to go to the ER or return to office if the wound worsens or if constitutional symptoms are present. -Left foot xrays reviewed -Advised patient to continue with buddy taping left 2nd toe -Cont with post op shoe for Left 2nd toe fracture; patient will greatly benefit from bone stimulator since this is not healing; [facility]/no health insurance; [employer] rep contacted regards charity assistance -Patient to return to office in 4 weeks for follow up care and evaluation or sooner if problems arise.  TLandis Martins DPM

## 2016-04-13 ENCOUNTER — Encounter: Payer: Self-pay | Admitting: Sports Medicine

## 2016-04-13 ENCOUNTER — Ambulatory Visit (INDEPENDENT_AMBULATORY_CARE_PROVIDER_SITE_OTHER): Payer: Self-pay

## 2016-04-13 ENCOUNTER — Ambulatory Visit (INDEPENDENT_AMBULATORY_CARE_PROVIDER_SITE_OTHER): Payer: No Typology Code available for payment source | Admitting: Sports Medicine

## 2016-04-13 DIAGNOSIS — L97511 Non-pressure chronic ulcer of other part of right foot limited to breakdown of skin: Secondary | ICD-10-CM

## 2016-04-13 DIAGNOSIS — E1142 Type 2 diabetes mellitus with diabetic polyneuropathy: Secondary | ICD-10-CM

## 2016-04-13 DIAGNOSIS — M79672 Pain in left foot: Secondary | ICD-10-CM

## 2016-04-13 DIAGNOSIS — B351 Tinea unguium: Secondary | ICD-10-CM

## 2016-04-13 DIAGNOSIS — S92912K Unspecified fracture of left toe(s), subsequent encounter for fracture with nonunion: Secondary | ICD-10-CM

## 2016-04-13 DIAGNOSIS — L89891 Pressure ulcer of other site, stage 1: Secondary | ICD-10-CM

## 2016-04-13 NOTE — Progress Notes (Signed)
Patient ID: Rebecca DOMBECK, female   DOB: 21-Apr-1966, 50 y.o.   MRN: [id number]  Subjective: Rebecca [name] is a 50 y.o. Diabetic female patient seen in office for evaluation of ulceration of the right big toe and evaluation of healing of fracture at Left 2nd toe using Exogen x 3 weeks and using post op shoe.  Patient is changing the dressing on right big toe using neosporin and bandaid. Denies nausea/fever/vomiting/chills/night sweats/shortness of breath/pain. Patient has no other pedal complaints at this time.  Assisted by Daughter at this visit to translate.   Patient Active Problem List   Diagnosis Date Noted  . AKI (acute kidney injury) ([facility]) 03/04/2016  . Toe ulcer, right ([facility]) 11/07/2015  . Toe fracture 11/07/2015  . Essential hypertension 05/14/2015  . CAD- with distal LAD disease, ? 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 05/14/2015  . NSTEMI (non-ST elevated myocardial infarction) ([facility])   . Syncope 05/13/2015  . Hyperglycemia 10/25/2014  . Abrasion of thigh, left 10/25/2014  . DM (diabetes mellitus), type 2, uncontrolled ([facility])   . DM hyperosmolar coma, type 2 ([facility]) 05/22/2012  . Hyperosmolar syndrome 05/21/2012   Current Outpatient Prescriptions on File Prior to Visit  Medication Sig Dispense Refill  . aspirin EC 81 MG EC tablet Take 1 tablet (81 mg total) by mouth daily.    Marland Kitchen atorvastatin (LIPITOR) 80 MG tablet Take 1 tablet (80 mg total) by mouth daily at 6 PM. (Patient taking differently: Take 40 mg by mouth daily at 6 PM. ) 30 tablet 11  . carvedilol (COREG) 6.25 MG tablet Take 1 tablet (6.25 mg total) by mouth 2 (two) times daily with a meal. 60 tablet 11  . gabapentin (NEURONTIN) 600 MG tablet Take 2 tablets (1,200 mg total) by mouth at bedtime. (Patient taking differently: Take 1,200 mg by mouth 2 (two) times daily. ) 30 tablet 1  . insulin aspart (NOVOLOG) 100 UNIT/ML injection Inject 6 Units into the skin 3 (three) times daily before meals. 10 mL 1  . insulin detemir (LEVEMIR) 100 UNIT/ML  injection Inject 0.42 mLs (42 Units total) into the skin at bedtime. 10 mL 1  . lisinopril-hydrochlorothiazide (PRINZIDE,ZESTORETIC) 20-12.5 MG per tablet Take 1 tablet by mouth daily. For blood pressure 30 tablet 0  . nitroGLYCERIN (NITROSTAT) 0.4 MG SL tablet Place 1 tablet (0.4 mg total) under the tongue every 5 (five) minutes as needed for chest pain. 25 tablet 3   No current facility-administered medications on file prior to visit.    No Active Allergies  Recent Results (from the past 2160 hour(s))  CBG monitoring, ED     Status: Abnormal   Collection Time: 03/04/16  8:43 PM  Result Value Ref Range   Glucose-Capillary 247 (H) 65 - 99 mg/dL  Basic metabolic panel     Status: Abnormal   Collection Time: 03/04/16  9:48 PM  Result Value Ref Range   Sodium 133 (L) 135 - 145 mmol/L   Potassium 4.4 3.5 - 5.1 mmol/L   Chloride 99 (L) 101 - 111 mmol/L   CO2 21 (L) 22 - 32 mmol/L   Glucose, Bld 258 (H) 65 - 99 mg/dL   BUN 64 (H) 6 - 20 mg/dL   Creatinine, Ser 5.09 (H) 0.44 - 1.00 mg/dL   Calcium 9.5 8.9 - 32.6 mg/dL   GFR calc non Af Amer 24 (L) >60 mL/min   GFR calc Af Amer 27 (L) >60 mL/min    Comment: (NOTE) The eGFR has been calculated using the CKD  EPI equation. This calculation has not been validated in all clinical situations. eGFR's persistently <60 mL/min signify possible Chronic Kidney Disease.    Anion gap 13 5 - 15  CBC     Status: Abnormal   Collection Time: 03/04/16  9:48 PM  Result Value Ref Range   WBC 11.0 (H) 4.0 - 10.5 K/uL   RBC 4.37 3.87 - 5.11 MIL/uL   Hemoglobin 11.5 (L) 12.0 - 15.0 g/dL   HCT 34.4 (L) 36.0 - 46.0 %   MCV 78.7 78.0 - 100.0 fL   MCH 26.3 26.0 - 34.0 pg   MCHC 33.4 30.0 - 36.0 g/dL   RDW 13.4 11.5 - 15.5 %   Platelets 299 150 - 400 K/uL  Troponin I     Status: None   Collection Time: 03/04/16 11:48 PM  Result Value Ref Range   Troponin I <0.03 <0.031 ng/mL    Comment:        NO INDICATION OF MYOCARDIAL INJURY.   Glucose, capillary      Status: Abnormal   Collection Time: 03/05/16  1:05 AM  Result Value Ref Range   Glucose-Capillary 258 (H) 65 - 99 mg/dL  Urinalysis, Routine w reflex microscopic     Status: Abnormal   Collection Time: 03/05/16  4:57 AM  Result Value Ref Range   Color, Urine YELLOW YELLOW   APPearance CLEAR CLEAR   Specific Gravity, Urine 1.017 1.005 - 1.030   pH 5.5 5.0 - 8.0   Glucose, UA 500 (A) NEGATIVE mg/dL   Hgb urine dipstick NEGATIVE NEGATIVE   Bilirubin Urine NEGATIVE NEGATIVE   Ketones, ur NEGATIVE NEGATIVE mg/dL   Protein, ur NEGATIVE NEGATIVE mg/dL   Nitrite NEGATIVE NEGATIVE   Leukocytes, UA TRACE (A) NEGATIVE  Urine microscopic-add on     Status: Abnormal   Collection Time: 03/05/16  4:57 AM  Result Value Ref Range   Squamous Epithelial / LPF 0-5 (A) NONE SEEN   WBC, UA 0-5 0 - 5 WBC/hpf   RBC / HPF 0-5 0 - 5 RBC/hpf   Bacteria, UA FEW (A) NONE SEEN   Casts HYALINE CASTS (A) NEGATIVE   Urine-Other YEAST PRESENT   Basic metabolic panel     Status: Abnormal   Collection Time: 03/05/16  5:43 AM  Result Value Ref Range   Sodium 136 135 - 145 mmol/L   Potassium 4.1 3.5 - 5.1 mmol/L   Chloride 105 101 - 111 mmol/L   CO2 23 22 - 32 mmol/L   Glucose, Bld 250 (H) 65 - 99 mg/dL   BUN 52 (H) 6 - 20 mg/dL   Creatinine, Ser 1.39 (H) 0.44 - 1.00 mg/dL   Calcium 8.7 (L) 8.9 - 10.3 mg/dL   GFR calc non Af Amer 43 (L) >60 mL/min   GFR calc Af Amer 50 (L) >60 mL/min    Comment: (NOTE) The eGFR has been calculated using the CKD EPI equation. This calculation has not been validated in all clinical situations. eGFR's persistently <60 mL/min signify possible Chronic Kidney Disease.    Anion gap 8 5 - 15  Glucose, capillary     Status: Abnormal   Collection Time: 03/05/16  7:54 AM  Result Value Ref Range   Glucose-Capillary 230 (H) 65 - 99 mg/dL   Comment 1 Notify RN   Glucose, capillary     Status: Abnormal   Collection Time: 03/05/16 11:46 AM  Result Value Ref Range    Glucose-Capillary 279 (H) 65 - 99  mg/dL   Comment 1 Notify RN     Objective: There were no vitals filed for this visit.  General: Patient is awake, alert, oriented x 3 and in no acute distress.  Dermatology: Skin is warm and dry bilateral with a partial thickness ulceration present Right plantar medial hallux. Ulceration measures 0.3cm x 0.2 cm x 0.1 cm (last measurement same) post debridement . There is a keratotic border with a granular base. The ulceration does not probe to bone. There is no malodor, no active drainage, no erythema, no edema. No acute signs of infection. Bilateral hallux nails are elongated, thickened with trauma lines and subungal debris, all other nails within normal limits.    Vascular: Dorsalis Pedis pulse = 2/4 Bilateral,  Posterior Tibial pulse = 2/4 Bilateral,  Capillary Fill Time < 5 seconds  Neurologic: Protective sensation diminished bilateral using 5.07/10g Semmes Weinstein Monofilament.  Musculosketal: There is no pain to left 2nd toe fracture site with hammertoe. No Pain with palpation to ulcerated area at right hallux. No pain with compression to calves bilateral.  Xray, left foot: Consistent with fracture at base of left 2nd toe with visible fracture line of 8m, comunition, extra-articular,mild increased radiopacity, No other acute findings.    Assessment and Plan:  Problem List Items Addressed This Visit      Musculoskeletal and Integument   Toe ulcer, right (HCC)   Toe fracture    Other Visit Diagnoses    Left foot pain    -  Primary   Relevant Orders   DG Foot Complete Left   Diabetic polyneuropathy associated with type 2 diabetes mellitus (H[facility])       Dermatophytosis of nail         -Examined patient  -Mechanically debrided bilateral hallux nails using sterile nail nipper without incident -Discussed the progression of the wound and treatment alternatives for right hallux  -Excisionally dedbrided ulceration at right hallux to healthy  bleeding borders using a sterile chisel blade. -Applied topical antibiotic cream, offloading pads and dry sterile dressing and instructed patient to continue with daily dressings at home consisting of neosporin and bandaid/dry sterile dressing. - Advised patient to go to the ER or return to office if the wound worsens or if constitutional symptoms are present. -Left foot xrays reviewed -Advised patient to continue with buddy taping left 2nd toe -Cont with post op shoe for Left 2nd toe fracture; Continue with Exogen bone stim daily  -Patient to return to office in 6 weeks for follow up care and evaluation or sooner if problems arise.  TLandis Martins DPM

## 2016-05-26 ENCOUNTER — Ambulatory Visit: Payer: No Typology Code available for payment source | Admitting: Sports Medicine

## 2016-06-08 ENCOUNTER — Ambulatory Visit (INDEPENDENT_AMBULATORY_CARE_PROVIDER_SITE_OTHER): Payer: No Typology Code available for payment source | Admitting: Podiatry

## 2016-06-08 DIAGNOSIS — M79672 Pain in left foot: Secondary | ICD-10-CM

## 2016-06-08 DIAGNOSIS — L97512 Non-pressure chronic ulcer of other part of right foot with fat layer exposed: Secondary | ICD-10-CM

## 2016-06-08 DIAGNOSIS — E1142 Type 2 diabetes mellitus with diabetic polyneuropathy: Secondary | ICD-10-CM

## 2016-06-08 DIAGNOSIS — I70235 Atherosclerosis of native arteries of right leg with ulceration of other part of foot: Secondary | ICD-10-CM

## 2016-06-08 DIAGNOSIS — M79671 Pain in right foot: Secondary | ICD-10-CM

## 2016-06-08 NOTE — Progress Notes (Addendum)
Subjective:  [id number] year old female diabetic patient with neuropathy presents for evaluation of her right great toe ulceration. Patient states that over the last 3 days there's been yellow discharge with blood. Patient has no other complaints at this time. Patient was last seen on July 24 in the care of Dr. Marylene LandStover   Objective: Physical Exam General: The patient is alert and oriented x3 in no acute distress.  Dermatology: Skin is warm, dry and supple bilateral lower extremities.   Wound noted to the right great toe measuring 1.5 cm in length 1.0 cm in width 0.2 cm in depth. To the noted ulceration there is no purulent drainage there is no malodor in the periwound integrity is intact. Periwound is mildly macerated. Wound base is granular with minimal fibrotic tissue.  Vascular: Palpable pedal pulses bilaterally. No edema or erythema noted. Capillary refill within normal limits.  Neurological: Epicritic and protective threshold absent bilaterally Musculoskeletal Exam: Range of motion within normal limits to all pedal and ankle joints bilateral. Muscle strength 5/5 in all groups bilateral.    Assessment: #1 diabetes mellitus with peripheral neuropathy #2 ulceration right great toe secondary to diabetes mellitus #3 bilateral lower extremity edema  Problem List Items Addressed This Visit    None    Visit Diagnoses   None.       Plan of Care:  #1 Patient was evaluated. #2 Medically necessary excisional debridement including subcutaneous tissue was performed using a tissue nipper. Excisional debridement of all necrotic nonviable tissue down to healthy bleeding viable tissue was performed with post-debridement measurements and was pre-. #3 Silvadene cream and a dressing was applied. #4 patient is to continue daily dressing changes at home. #5 patient is to return to clinic in 2 weeks with Dr. Marylene LandStover. #6 if the patient notices any erythema extending proximally indicative of infection she's  to report immediately to the emergency department.  #7 due to the nature of the wound which is nonprogressive patient may possibly be [facility]. The patient has had this wound for greater than 8 months. This is the recommendation by Dr. Loreta AveWagner to be determined by Dr. Marylene LandStover on follow-up visit.     Dr. Felecia ShellingBrent M. Maddison Kilner, DPM [facility]

## 2016-06-08 NOTE — Patient Instructions (Signed)
Diabetes and Foot Care Diabetes may cause you to have problems because of poor blood supply (circulation) to your feet and legs. This may cause the skin on your feet to become thinner, break easier, and heal more slowly. Your skin may become dry, and the skin may peel and crack. You may also have nerve damage in your legs and feet causing decreased feeling in them. You may not notice minor injuries to your feet that could lead to infections or more serious problems. Taking care of your feet is one of the most important things you can do for yourself.  HOME CARE INSTRUCTIONS  Wear shoes at all times, even in the house. Do not go barefoot. Bare feet are easily injured.  Check your feet daily for blisters, cuts, and redness. If you cannot see the bottom of your feet, use a mirror or ask someone for help.  Wash your feet with warm water (do not use hot water) and mild soap. Then pat your feet and the areas between your toes until they are completely dry. Do not soak your feet as this can dry your skin.  Apply a moisturizing lotion or petroleum jelly (that does not contain alcohol and is unscented) to the skin on your feet and to dry, brittle toenails. Do not apply lotion between your toes.  Trim your toenails straight across. Do not dig under them or around the cuticle. File the edges of your nails with an emery board or nail file.  Do not cut corns or calluses or try to remove them with medicine.  Wear clean socks or stockings every day. Make sure they are not too tight. Do not wear knee-high stockings since they may decrease blood flow to your legs.  Wear shoes that fit properly and have enough cushioning. To break in new shoes, wear them for just a few hours a day. This prevents you from injuring your feet. Always look in your shoes before you put them on to be sure there are no objects inside.  Do not cross your legs. This may decrease the blood flow to your feet.  If you find a minor scrape,  cut, or break in the skin on your feet, keep it and the skin around it clean and dry. These areas may be cleansed with mild soap and water. Do not cleanse the area with peroxide, alcohol, or iodine.  When you remove an adhesive bandage, be sure not to damage the skin around it.  If you have a wound, look at it several times a day to make sure it is healing.  Do not use heating pads or hot water bottles. They may burn your skin. If you have lost feeling in your feet or legs, you may not know it is happening until it is too late.  Make sure your health care provider performs a complete foot exam at least annually or more often if you have foot problems. Report any cuts, sores, or bruises to your health care provider immediately. SEEK MEDICAL CARE IF:   You have an injury that is not healing.  You have cuts or breaks in the skin.  You have an ingrown nail.  You notice redness on your legs or feet.  You feel burning or tingling in your legs or feet.  You have pain or cramps in your legs and feet.  Your legs or feet are numb.  Your feet always feel cold. SEEK IMMEDIATE MEDICAL CARE IF:   There is increasing redness,   swelling, or pain in or around a wound.  There is a red line that goes up your leg.  Pus is coming from a wound.  You develop a fever or as directed by your health care provider.  You notice a bad smell coming from an ulcer or wound.   This information is not intended to replace advice given to you by your health care provider. Make sure you discuss any questions you have with your health care provider.   Document Released: 09/04/2000 Document Revised: 05/10/2013 Document Reviewed: 02/14/2013 Elsevier Interactive Patient Education 2016 [employer]  

## 2016-06-16 ENCOUNTER — Ambulatory Visit: Payer: No Typology Code available for payment source | Admitting: Sports Medicine

## 2016-06-23 ENCOUNTER — Ambulatory Visit: Payer: No Typology Code available for payment source | Admitting: Sports Medicine

## 2016-09-05 ENCOUNTER — Emergency Department (HOSPITAL_COMMUNITY)
Admission: EM | Admit: 2016-09-05 | Discharge: 2016-09-05 | Disposition: A | Discharge status: Home / Self Care | Payer: Self-pay | Attending: Emergency Medicine | Admitting: Emergency Medicine

## 2016-09-05 ENCOUNTER — Emergency Department (HOSPITAL_COMMUNITY): Payer: Self-pay

## 2016-09-05 ENCOUNTER — Encounter (HOSPITAL_COMMUNITY): Payer: Self-pay | Admitting: Emergency Medicine

## 2016-09-05 DIAGNOSIS — I10 Essential (primary) hypertension: Secondary | ICD-10-CM | POA: Insufficient documentation

## 2016-09-05 DIAGNOSIS — Z79899 Other long term (current) drug therapy: Secondary | ICD-10-CM | POA: Insufficient documentation

## 2016-09-05 DIAGNOSIS — E119 Type 2 diabetes mellitus without complications: Secondary | ICD-10-CM | POA: Insufficient documentation

## 2016-09-05 DIAGNOSIS — I2510 Atherosclerotic heart disease of native coronary artery without angina pectoris: Secondary | ICD-10-CM | POA: Insufficient documentation

## 2016-09-05 DIAGNOSIS — I252 Old myocardial infarction: Secondary | ICD-10-CM | POA: Insufficient documentation

## 2016-09-05 DIAGNOSIS — Z794 Long term (current) use of insulin: Secondary | ICD-10-CM | POA: Insufficient documentation

## 2016-09-05 DIAGNOSIS — J189 Pneumonia, unspecified organism: Secondary | ICD-10-CM | POA: Insufficient documentation

## 2016-09-05 DIAGNOSIS — Z7982 Long term (current) use of aspirin: Secondary | ICD-10-CM | POA: Insufficient documentation

## 2016-09-05 LAB — CBC WITH DIFFERENTIAL/PLATELET
BASOS PCT: 0 %
Basophils Absolute: 0 10*3/uL (ref 0.0–0.1)
EOS ABS: 0.2 10*3/uL (ref 0.0–0.7)
Eosinophils Relative: 3 %
HEMATOCRIT: 31.1 % — AB (ref 36.0–46.0)
Hemoglobin: 10.5 g/dL — ABNORMAL LOW (ref 12.0–15.0)
Lymphocytes Relative: 26 %
Lymphs Abs: 1.8 10*3/uL (ref 0.7–4.0)
MCH: 26.2 pg (ref 26.0–34.0)
MCHC: 33.8 g/dL (ref 30.0–36.0)
MCV: 77.6 fL — ABNORMAL LOW (ref 78.0–100.0)
MONO ABS: 0.6 10*3/uL (ref 0.1–1.0)
MONOS PCT: 8 %
Neutro Abs: 4.2 10*3/uL (ref 1.7–7.7)
Neutrophils Relative %: 63 %
Platelets: 263 10*3/uL (ref 150–400)
RBC: 4.01 MIL/uL (ref 3.87–5.11)
RDW: 13.2 % (ref 11.5–15.5)
WBC: 6.8 10*3/uL (ref 4.0–10.5)

## 2016-09-05 LAB — COMPREHENSIVE METABOLIC PANEL
ALBUMIN: 3.3 g/dL — AB (ref 3.5–5.0)
ALT: 29 U/L (ref 14–54)
ANION GAP: 7 (ref 5–15)
AST: 26 U/L (ref 15–41)
Alkaline Phosphatase: 92 U/L (ref 38–126)
BILIRUBIN TOTAL: 0.6 mg/dL (ref 0.3–1.2)
BUN: 38 mg/dL — ABNORMAL HIGH (ref 6–20)
CO2: 25 mmol/L (ref 22–32)
Calcium: 8.9 mg/dL (ref 8.9–10.3)
Chloride: 106 mmol/L (ref 101–111)
Creatinine, Ser: 0.73 mg/dL (ref 0.44–1.00)
GFR calc Af Amer: >60 mL/min (ref >60)
GFR calc non Af Amer: >60 mL/min (ref >60)
GLUCOSE: 269 mg/dL — AB (ref 65–99)
POTASSIUM: 4.3 mmol/L (ref 3.5–5.1)
SODIUM: 138 mmol/L (ref 135–145)
TOTAL PROTEIN: 6.4 g/dL — AB (ref 6.5–8.1)

## 2016-09-05 LAB — POC URINE PREG, ED: Preg Test, Ur: NEGATIVE

## 2016-09-05 MED ORDER — SODIUM CHLORIDE 0.9 % IJ SOLN
INTRAMUSCULAR | Status: AC
  Filled 2016-09-05: qty 50

## 2016-09-05 MED ORDER — AZITHROMYCIN 250 MG PO TABS: 250.0000 mg | ORAL_TABLET | Freq: Every day | ORAL | 0 refills | Status: DC

## 2016-09-05 MED ORDER — METHYLPREDNISOLONE SODIUM SUCC 125 MG IJ SOLR
125.0000 mg | Freq: Once | INTRAMUSCULAR | Status: AC
  Administered 2016-09-05: 125 mg via INTRAVENOUS
  Filled 2016-09-05: qty 2

## 2016-09-05 MED ORDER — DEXTROSE 5 % IV SOLN
500.0000 mg | Freq: Once | INTRAVENOUS | Status: AC
  Administered 2016-09-05: 500 mg via INTRAVENOUS
  Filled 2016-09-05: qty 500

## 2016-09-05 MED ORDER — CEFTRIAXONE SODIUM 1 G IJ SOLR
1.0000 g | Freq: Once | INTRAMUSCULAR | Status: AC
  Administered 2016-09-05: 1 g via INTRAVENOUS
  Filled 2016-09-05: qty 10

## 2016-09-05 MED ORDER — PREDNISONE 10 MG PO TABS: 20.0000 mg | ORAL_TABLET | Freq: Every day | ORAL | 0 refills | Status: DC

## 2016-09-05 MED ORDER — IOPAMIDOL (ISOVUE-300) INJECTION 61%
INTRAVENOUS | Status: AC
  Administered 2016-09-05: 75 mL
  Filled 2016-09-05: qty 75

## 2016-09-05 NOTE — ED Provider Notes (Signed)
[facility]-EMERGENCY DEPT Provider Note   CSN: [id number] Arrival date & time: 09/05/16  1402     History   Chief Complaint Chief Complaint  Patient presents with  . Hemoptysis    HPI Rebecca [name] is a 50 y.o. female.  Patient reports several episodes of hemoptysis earlier today with uncertain volume of blood. This has never happened before. No prodromal respiratory illnesses. No chest pain or dyspnea. Symptoms have abated at this point. She is feeling back to baseline.      Past Medical History:  Diagnosis Date  . Anemia   . Diabetes mellitus   . Dyslipidemia   . Hypertension   . Non-STEMI (non-ST elevated myocardial infarction) [facility](HCC)    Medical therapy for distal LAD dissection    Patient Active Problem List   Diagnosis Date Noted  . AKI (acute kidney injury) (HCC) 03/04/2016  . Toe ulcer, right (HCC) 11/07/2015  . Toe fracture 11/07/2015  . Essential hypertension 05/14/2015  . CAD- with distal LAD disease, ? 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 05/14/2015  . NSTEMI (non-ST elevated myocardial infarction) (HCC)   . Syncope 05/13/2015  . Hyperglycemia 10/25/2014  . Abrasion of thigh, left 10/25/2014  . DM (diabetes mellitus), type 2, uncontrolled (HCC)   . DM hyperosmolar coma, type 2 (HCC) 05/22/2012  . Hyperosmolar syndrome 05/21/2012    Past Surgical History:  Procedure Laterality Date  . CARDIAC CATHETERIZATION N/A 05/14/2015   Procedure: Left Heart Cath and Coronary Angiography;  Surgeon: Marykay Lexavid W Harding, MD; dLAD 60%>>95% (small vessel, ?dissection), CFX & RCA systems no sig dz, EF & LVEDP nl       OB History    No data available       Home Medications    Prior to Admission medications   Medication Sig Start Date End Date Taking? Authorizing Provider  aspirin EC 81 MG EC tablet Take 1 tablet (81 mg total) by mouth daily. 05/16/15  Yes Rhonda G Barrett, PA-C  atorvastatin (LIPITOR) 80 MG tablet Take 1 tablet (80 mg total) by mouth daily at 6 PM. Patient taking  differently: Take 10 mg by mouth daily at 6 PM.  05/16/15  Yes Rhonda G Barrett, PA-C  gabapentin (NEURONTIN) 600 MG tablet Take 2 tablets (1,200 mg total) by mouth at bedtime. Patient taking differently: Take 600 mg by mouth 2 (two) times daily.  03/19/15  Yes Linna HoffJames D Kindl, MD  insulin aspart (NOVOLOG) 100 UNIT/ML injection Inject 6 Units into the skin 3 (three) times daily before meals. Patient taking differently: Inject 6 Units into the skin 2 (two) times daily.  03/19/15  Yes Linna HoffJames D Kindl, MD  insulin detemir (LEVEMIR) 100 UNIT/ML injection Inject 0.42 mLs (42 Units total) into the skin at bedtime. Patient taking differently: Inject 20 Units into the skin at bedtime.  03/19/15  Yes Linna HoffJames D Kindl, MD  olmesartan (BENICAR) 20 MG tablet Take 20 mg by mouth daily.   Yes Historical Provider, MD  azithromycin (ZITHROMAX) 250 MG tablet Take 1 tablet (250 mg total) by mouth daily. Take first 2 tablets together, then 1 every day until finished. 09/05/16   Donnetta HutchingBrian Chanique Duca, MD  carvedilol (COREG) 6.25 MG tablet Take 1 tablet (6.25 mg total) by mouth 2 (two) times daily with a meal. Patient not taking: Reported on 09/05/2016 05/16/15   Joline Salthonda G Barrett, PA-C  lisinopril-hydrochlorothiazide (PRINZIDE,ZESTORETIC) 20-12.5 MG per tablet Take 1 tablet by mouth daily. For blood pressure Patient not taking: Reported on 09/05/2016 03/19/15   Linna HoffJames D Kindl,  MD  nitroGLYCERIN (NITROSTAT) 0.4 MG SL tablet Place 1 tablet (0.4 mg total) under the tongue every 5 (five) minutes as needed for chest pain. Patient not taking: Reported on 09/05/2016 05/16/15   Joline Salt Barrett, PA-C  predniSONE (DELTASONE) 10 MG tablet Take 2 tablets (20 mg total) by mouth daily. 09/05/16   Donnetta Hutching, MD    Family History Family History  Problem Relation Age of Onset  . Hypertension Father   . Hyperlipidemia Father   . Cancer Mother   . Diabetes Sister   . Diabetes Brother   . Diabetes Sister   . Diabetes Sister   . Diabetes Sister   .  Diabetes Brother   . Diabetes Brother   . Diabetes Brother   . Diabetes Brother     Social History Social History  Substance Use Topics  . Smoking status: Never Smoker  . Smokeless tobacco: Never Used  . Alcohol use No     Allergies   Patient has no active allergies.   Review of Systems Review of Systems  All other systems reviewed and are negative.    Physical Exam Updated Vital Signs BP 162/83 (BP Location: Right Arm)   Pulse 115   Temp 99 F (37.2 C) (Oral)   Resp 18   SpO2 96%   Physical Exam  Constitutional: She is oriented to person, place, and time. She appears well-developed and well-nourished.  Patient is in no acute distress.  HENT:  Head: Normocephalic and atraumatic.  Eyes: Conjunctivae are normal.  Neck: Neck supple.  Cardiovascular: Normal rate and regular rhythm.   Pulmonary/Chest: Effort normal and breath sounds normal.  Abdominal: Soft. Bowel sounds are normal.  Musculoskeletal: Normal range of motion.  Neurological: She is alert and oriented to person, place, and time.  Skin: Skin is warm and dry.  Psychiatric: She has a normal mood and affect. Her behavior is normal.  Nursing note and vitals reviewed.    ED Treatments / Results  Labs (all labs ordered are listed, but only abnormal results are displayed) Labs Reviewed  CBC WITH DIFFERENTIAL/PLATELET - Abnormal; Notable for the following:       Result Value   Hemoglobin 10.5 (*)    HCT 31.1 (*)    MCV 77.6 (*)    All other components within normal limits  COMPREHENSIVE METABOLIC PANEL - Abnormal; Notable for the following:    Glucose, Bld 269 (*)    BUN 38 (*)    Total Protein 6.4 (*)    Albumin 3.3 (*)    All other components within normal limits  POC URINE PREG, ED    EKG  EKG Interpretation None       Radiology Dg Chest 2 View  Result Date: 09/05/2016 CLINICAL DATA:  Cough.  Hemoptysis. EXAM: CHEST  2 VIEW COMPARISON:  08/16/2012. FINDINGS: Normal sized heart.  Interval diffuse prominence of the interstitial markings. No airspace consolidation. Mild scoliosis. Mid thoracic spine degenerative change. Mild right shoulder degenerative changes. IMPRESSION: Interval diffuse prominence of the interstitial markings, suspicious for interstitial pneumonitis. Pulmonary edema is less likely with a normal sized heart and normal pulmonary vasculature. Electronically Signed   By: Beckie Salts M.D.   On: 09/05/2016 15:33   Ct Chest W Contrast  Result Date: 09/05/2016 CLINICAL DATA:  Hemoptysis. EXAM: CT CHEST WITH CONTRAST TECHNIQUE: Multidetector CT imaging of the chest was performed during intravenous contrast administration. CONTRAST:  1 ISOVUE-300 IOPAMIDOL (ISOVUE-300) INJECTION 61% COMPARISON:  Two-view chest x-ray from the  same day. FINDINGS: Cardiovascular: The heart size is normal. No significant pleural or pericardial effusion is present. Great vessels are unremarkable. Mediastinum/Nodes: Subcentimeter peritracheal and axillary lymph nodes are present. No significant adenopathy is evident. A small hiatal hernia is present. The distal esophagus is dilated. Lungs/Pleura: Diffuse ground-glass airspace opacification is present in the upper and lower lobes bilaterally, more prominent on the left. No focal mass lesion is present. There are no effusions. Upper Abdomen: No focal solid organ lesions are present. The visualized bowel is unremarkable. Musculoskeletal: Vertebral body heights and alignment are normal. No focal lytic or blastic lesions are present. IMPRESSION: 1. Diffuse ground-glass airspace disease bilaterally is most concerning for bilateral pneumonia. Edema or hemorrhage could have a similar appearance. 2. Subcentimeter lymph nodes are likely reactive. 3. No evidence for malignancy. 4. Dilated distal esophagus and small hiatal hernia likely reflect esophageal dysmotility. Electronically Signed   By: Marin Robertshristopher  Mattern M.D.   On: 09/05/2016 18:04     Procedures Procedures (including critical care time)  Medications Ordered in ED Medications  sodium chloride 0.9 % injection (not administered)  methylPREDNISolone sodium succinate (SOLU-MEDROL) 125 mg/2 mL injection 125 mg (125 mg Intravenous Given 09/05/16 1658)  iopamidol (ISOVUE-300) 61 % injection (75 mLs  Contrast Given 09/05/16 1723)  cefTRIAXone (ROCEPHIN) 1 g in dextrose 5 % 50 mL IVPB (0 g Intravenous Stopped 09/05/16 2024)  azithromycin (ZITHROMAX) 500 mg in dextrose 5 % 250 mL IVPB (500 mg Intravenous New Bag/Given 09/05/16 2023)     Initial Impression / Assessment and Plan / ED Course  I have reviewed the triage vital signs and the nursing notes.  Pertinent labs & imaging results that were available during my care of the patient were reviewed by me and considered in my medical decision making (see chart for details).  Clinical Course    Patient is in no respiratory distress. CT chest with contrast is most concerning for bilateral pneumonia. No evidence of malignancy. Patient is hemodynamically stable. She appears well. She is not diaphoretic, dyspneic, tachypnea take. Discharge medications Zithromax and prednisone.   Patient is in no respiratory distress.    Final diagnoses:  Pneumonitis    New Prescriptions New Prescriptions   AZITHROMYCIN (ZITHROMAX) 250 MG TABLET    Take 1 tablet (250 mg total) by mouth daily. Take first 2 tablets together, then 1 every day until finished.   PREDNISONE (DELTASONE) 10 MG TABLET    Take 2 tablets (20 mg total) by mouth daily.     Donnetta HutchingBrian Drinda Belgard, MD 09/05/16 2141

## 2016-09-05 NOTE — ED Notes (Signed)
Patient transported to CT 

## 2016-09-05 NOTE — Discharge Instructions (Signed)
Chest x-ray and CT scan showed pneumonitis which is lung inflammation but no obvious pneumonia. Prescription for antibiotic and prednisone to start tomorrow around dinnertime. Return if worse in anyway.

## 2016-09-05 NOTE — ED Triage Notes (Signed)
Patient here with complaints of coughing up blood. Reports that she was sitting on the couch, coughed, and heard something pop and started coughing up bright red blood.

## 2016-09-05 NOTE — ED Notes (Signed)
Bed: [id number] Expected date:  Expected time:  Means of arrival:  Comments: 50 yo hematemesis with rhonchi

## 2016-12-09 ENCOUNTER — Other Ambulatory Visit: Payer: Self-pay | Admitting: Internal Medicine

## 2016-12-09 DIAGNOSIS — E0590 Thyrotoxicosis, unspecified without thyrotoxic crisis or storm: Secondary | ICD-10-CM

## 2017-01-11 ENCOUNTER — Encounter (HOSPITAL_COMMUNITY)
Admission: RE | Admit: 2017-01-11 | Discharge: 2017-01-11 | Disposition: A | Discharge status: Home / Self Care | Payer: Self-pay | Source: Ambulatory Visit | Attending: Internal Medicine | Admitting: Internal Medicine

## 2017-01-11 DIAGNOSIS — E0590 Thyrotoxicosis, unspecified without thyrotoxic crisis or storm: Secondary | ICD-10-CM | POA: Insufficient documentation

## 2017-01-11 MED ORDER — SODIUM IODIDE I 131 CAPSULE
7.8000 | Freq: Once | INTRAVENOUS | Status: AC
  Administered 2017-01-11: 7.8 via ORAL

## 2017-01-12 ENCOUNTER — Encounter (HOSPITAL_COMMUNITY)
Admission: RE | Admit: 2017-01-12 | Discharge: 2017-01-12 | Disposition: A | Discharge status: Home / Self Care | Payer: Self-pay | Source: Ambulatory Visit | Attending: Internal Medicine | Admitting: Internal Medicine

## 2017-01-12 MED ORDER — SODIUM PERTECHNETATE TC 99M INJECTION
10.0000 | Freq: Once | INTRAVENOUS | Status: AC | PRN
  Administered 2017-01-12: 10 via INTRAVENOUS

## 2017-06-15 ENCOUNTER — Other Ambulatory Visit: Payer: Self-pay | Admitting: Family Medicine

## 2017-06-15 ENCOUNTER — Ambulatory Visit
Admission: RE | Admit: 2017-06-15 | Discharge: 2017-06-15 | Disposition: A | Discharge status: Home / Self Care | Payer: No Typology Code available for payment source | Source: Ambulatory Visit | Attending: Family Medicine | Admitting: Family Medicine

## 2017-06-15 DIAGNOSIS — L97509 Non-pressure chronic ulcer of other part of unspecified foot with unspecified severity: Principal | ICD-10-CM

## 2017-06-15 DIAGNOSIS — E11621 Type 2 diabetes mellitus with foot ulcer: Secondary | ICD-10-CM

## 2017-06-28 ENCOUNTER — Ambulatory Visit: Payer: Self-pay

## 2017-07-18 ENCOUNTER — Encounter (HOSPITAL_COMMUNITY): Payer: Self-pay | Admitting: Emergency Medicine

## 2017-07-18 ENCOUNTER — Emergency Department (HOSPITAL_COMMUNITY): Payer: Self-pay

## 2017-07-18 ENCOUNTER — Inpatient Hospital Stay (HOSPITAL_COMMUNITY)
Admission: EM | Admit: 2017-07-18 | Discharge: 2017-07-23 | DRG: 617 | Disposition: A | Discharge status: Home / Self Care | Payer: Self-pay | Attending: Internal Medicine | Admitting: Internal Medicine

## 2017-07-18 DIAGNOSIS — L97519 Non-pressure chronic ulcer of other part of right foot with unspecified severity: Secondary | ICD-10-CM | POA: Diagnosis present

## 2017-07-18 DIAGNOSIS — I10 Essential (primary) hypertension: Secondary | ICD-10-CM | POA: Diagnosis present

## 2017-07-18 DIAGNOSIS — E1165 Type 2 diabetes mellitus with hyperglycemia: Secondary | ICD-10-CM | POA: Diagnosis present

## 2017-07-18 DIAGNOSIS — M869 Osteomyelitis, unspecified: Secondary | ICD-10-CM

## 2017-07-18 DIAGNOSIS — E1040 Type 1 diabetes mellitus with diabetic neuropathy, unspecified: Secondary | ICD-10-CM | POA: Diagnosis present

## 2017-07-18 DIAGNOSIS — E10621 Type 1 diabetes mellitus with foot ulcer: Secondary | ICD-10-CM | POA: Diagnosis present

## 2017-07-18 DIAGNOSIS — Z9111 Patient's noncompliance with dietary regimen: Secondary | ICD-10-CM

## 2017-07-18 DIAGNOSIS — L03119 Cellulitis of unspecified part of limb: Secondary | ICD-10-CM

## 2017-07-18 DIAGNOSIS — L03115 Cellulitis of right lower limb: Secondary | ICD-10-CM | POA: Diagnosis present

## 2017-07-18 DIAGNOSIS — B353 Tinea pedis: Secondary | ICD-10-CM | POA: Diagnosis present

## 2017-07-18 DIAGNOSIS — E1069 Type 1 diabetes mellitus with other specified complication: Principal | ICD-10-CM | POA: Diagnosis present

## 2017-07-18 DIAGNOSIS — E11621 Type 2 diabetes mellitus with foot ulcer: Secondary | ICD-10-CM | POA: Diagnosis present

## 2017-07-18 DIAGNOSIS — E11628 Type 2 diabetes mellitus with other skin complications: Secondary | ICD-10-CM | POA: Diagnosis present

## 2017-07-18 DIAGNOSIS — Z79899 Other long term (current) drug therapy: Secondary | ICD-10-CM

## 2017-07-18 DIAGNOSIS — D649 Anemia, unspecified: Secondary | ICD-10-CM | POA: Diagnosis present

## 2017-07-18 DIAGNOSIS — Z7982 Long term (current) use of aspirin: Secondary | ICD-10-CM

## 2017-07-18 DIAGNOSIS — I2510 Atherosclerotic heart disease of native coronary artery without angina pectoris: Secondary | ICD-10-CM | POA: Diagnosis present

## 2017-07-18 DIAGNOSIS — M86171 Other acute osteomyelitis, right ankle and foot: Secondary | ICD-10-CM

## 2017-07-18 DIAGNOSIS — IMO0002 Reserved for concepts with insufficient information to code with codable children: Secondary | ICD-10-CM | POA: Diagnosis present

## 2017-07-18 DIAGNOSIS — E785 Hyperlipidemia, unspecified: Secondary | ICD-10-CM | POA: Diagnosis present

## 2017-07-18 DIAGNOSIS — N289 Disorder of kidney and ureter, unspecified: Secondary | ICD-10-CM

## 2017-07-18 DIAGNOSIS — N179 Acute kidney failure, unspecified: Secondary | ICD-10-CM | POA: Diagnosis present

## 2017-07-18 DIAGNOSIS — I252 Old myocardial infarction: Secondary | ICD-10-CM

## 2017-07-18 DIAGNOSIS — L97509 Non-pressure chronic ulcer of other part of unspecified foot with unspecified severity: Secondary | ICD-10-CM | POA: Diagnosis present

## 2017-07-18 DIAGNOSIS — Z23 Encounter for immunization: Secondary | ICD-10-CM

## 2017-07-18 LAB — BASIC METABOLIC PANEL
Anion gap: 8 (ref 5–15)
BUN: 39 mg/dL — AB (ref 6–20)
CHLORIDE: 101 mmol/L (ref 101–111)
CO2: 24 mmol/L (ref 22–32)
Calcium: 8.9 mg/dL (ref 8.9–10.3)
Creatinine, Ser: 1.35 mg/dL — ABNORMAL HIGH (ref 0.44–1.00)
GFR calc non Af Amer: 45 mL/min — ABNORMAL LOW (ref >60)
GFR, EST AFRICAN AMERICAN: 52 mL/min — AB (ref >60)
Glucose, Bld: 422 mg/dL — ABNORMAL HIGH (ref 65–99)
POTASSIUM: 4.6 mmol/L (ref 3.5–5.1)
SODIUM: 133 mmol/L — AB (ref 135–145)

## 2017-07-18 LAB — CBC WITH DIFFERENTIAL/PLATELET
Basophils Absolute: 0.1 10*3/uL (ref 0.0–0.1)
Basophils Relative: 1 %
EOS ABS: 0.2 10*3/uL (ref 0.0–0.7)
Eosinophils Relative: 2 %
HEMATOCRIT: 35.5 % — AB (ref 36.0–46.0)
HEMOGLOBIN: 11.8 g/dL — AB (ref 12.0–15.0)
LYMPHS ABS: 2.9 10*3/uL (ref 0.7–4.0)
Lymphocytes Relative: 42 %
MCH: 27.4 pg (ref 26.0–34.0)
MCHC: 33.2 g/dL (ref 30.0–36.0)
MCV: 82.4 fL (ref 78.0–100.0)
MONOS PCT: 6 %
Monocytes Absolute: 0.4 10*3/uL (ref 0.1–1.0)
NEUTROS ABS: 3.3 10*3/uL (ref 1.7–7.7)
Neutrophils Relative %: 49 %
Platelets: 322 10*3/uL (ref 150–400)
RBC: 4.31 MIL/uL (ref 3.87–5.11)
RDW: 13.5 % (ref 11.5–15.5)
WBC: 6.8 10*3/uL (ref 4.0–10.5)

## 2017-07-18 LAB — CBG MONITORING, ED: GLUCOSE-CAPILLARY: 235 mg/dL — AB (ref 65–99)

## 2017-07-18 NOTE — ED Triage Notes (Signed)
Diabetic pt c/o 8/10 right great toe pain since this morning, healing diabetic ulcer present on the bottom of the toe, pt states pain is throbbing, no fever no chills.

## 2017-07-19 ENCOUNTER — Encounter (HOSPITAL_BASED_OUTPATIENT_CLINIC_OR_DEPARTMENT_OTHER): Payer: Self-pay

## 2017-07-19 ENCOUNTER — Encounter (HOSPITAL_COMMUNITY): Payer: Self-pay | Admitting: Family Medicine

## 2017-07-19 ENCOUNTER — Observation Stay (HOSPITAL_COMMUNITY): Payer: Self-pay

## 2017-07-19 ENCOUNTER — Other Ambulatory Visit (INDEPENDENT_AMBULATORY_CARE_PROVIDER_SITE_OTHER): Payer: Self-pay | Admitting: Orthopedic Surgery

## 2017-07-19 DIAGNOSIS — L97514 Non-pressure chronic ulcer of other part of right foot with necrosis of bone: Secondary | ICD-10-CM

## 2017-07-19 DIAGNOSIS — M869 Osteomyelitis, unspecified: Secondary | ICD-10-CM

## 2017-07-19 DIAGNOSIS — E1165 Type 2 diabetes mellitus with hyperglycemia: Secondary | ICD-10-CM

## 2017-07-19 DIAGNOSIS — L97519 Non-pressure chronic ulcer of other part of right foot with unspecified severity: Secondary | ICD-10-CM

## 2017-07-19 DIAGNOSIS — L03119 Cellulitis of unspecified part of limb: Secondary | ICD-10-CM

## 2017-07-19 DIAGNOSIS — L97509 Non-pressure chronic ulcer of other part of unspecified foot with unspecified severity: Secondary | ICD-10-CM

## 2017-07-19 DIAGNOSIS — I2583 Coronary atherosclerosis due to lipid rich plaque: Secondary | ICD-10-CM

## 2017-07-19 DIAGNOSIS — E11628 Type 2 diabetes mellitus with other skin complications: Secondary | ICD-10-CM | POA: Diagnosis present

## 2017-07-19 DIAGNOSIS — I10 Essential (primary) hypertension: Secondary | ICD-10-CM

## 2017-07-19 DIAGNOSIS — I2510 Atherosclerotic heart disease of native coronary artery without angina pectoris: Secondary | ICD-10-CM

## 2017-07-19 DIAGNOSIS — N289 Disorder of kidney and ureter, unspecified: Secondary | ICD-10-CM

## 2017-07-19 DIAGNOSIS — E11621 Type 2 diabetes mellitus with foot ulcer: Secondary | ICD-10-CM

## 2017-07-19 DIAGNOSIS — M86171 Other acute osteomyelitis, right ankle and foot: Secondary | ICD-10-CM

## 2017-07-19 LAB — CBC WITH DIFFERENTIAL/PLATELET
BASOS ABS: 0 10*3/uL (ref 0.0–0.1)
Basophils Relative: 1 %
Eosinophils Absolute: 0.2 10*3/uL (ref 0.0–0.7)
Eosinophils Relative: 3 %
HEMATOCRIT: 38 % (ref 36.0–46.0)
Hemoglobin: 12.7 g/dL (ref 12.0–15.0)
LYMPHS ABS: 2.7 10*3/uL (ref 0.7–4.0)
LYMPHS PCT: 38 %
MCH: 27.5 pg (ref 26.0–34.0)
MCHC: 33.4 g/dL (ref 30.0–36.0)
MCV: 82.3 fL (ref 78.0–100.0)
MONO ABS: 0.7 10*3/uL (ref 0.1–1.0)
Monocytes Relative: 9 %
NEUTROS ABS: 3.6 10*3/uL (ref 1.7–7.7)
Neutrophils Relative %: 49 %
Platelets: 304 10*3/uL (ref 150–400)
RBC: 4.62 MIL/uL (ref 3.87–5.11)
RDW: 13.3 % (ref 11.5–15.5)
WBC: 7.2 10*3/uL (ref 4.0–10.5)

## 2017-07-19 LAB — BASIC METABOLIC PANEL
ANION GAP: 8 (ref 5–15)
BUN: 33 mg/dL — ABNORMAL HIGH (ref 6–20)
CHLORIDE: 103 mmol/L (ref 101–111)
CO2: 25 mmol/L (ref 22–32)
Calcium: 9.1 mg/dL (ref 8.9–10.3)
Creatinine, Ser: 1.09 mg/dL — ABNORMAL HIGH (ref 0.44–1.00)
GFR calc Af Amer: >60 mL/min (ref >60)
GFR, EST NON AFRICAN AMERICAN: 58 mL/min — AB (ref >60)
GLUCOSE: 220 mg/dL — AB (ref 65–99)
POTASSIUM: 3.9 mmol/L (ref 3.5–5.1)
SODIUM: 136 mmol/L (ref 135–145)

## 2017-07-19 LAB — GLUCOSE, CAPILLARY
GLUCOSE-CAPILLARY: 144 mg/dL — AB (ref 65–99)
Glucose-Capillary: 212 mg/dL — ABNORMAL HIGH (ref 65–99)
Glucose-Capillary: 182 mg/dL — ABNORMAL HIGH (ref 65–99)
Glucose-Capillary: 203 mg/dL — ABNORMAL HIGH (ref 65–99)

## 2017-07-19 LAB — SEDIMENTATION RATE: SED RATE: 54 mm/h — AB (ref 0–22)

## 2017-07-19 LAB — C-REACTIVE PROTEIN

## 2017-07-19 LAB — HEMOGLOBIN A1C
Hgb A1c MFr Bld: 8.3 % — ABNORMAL HIGH (ref 4.8–5.6)
Mean Plasma Glucose: 191.51 mg/dL

## 2017-07-19 LAB — HIV ANTIBODY (ROUTINE TESTING W REFLEX): HIV SCREEN 4TH GENERATION: NONREACTIVE

## 2017-07-19 LAB — PREALBUMIN: Prealbumin: 26 mg/dL (ref 18–38)

## 2017-07-19 MED ORDER — ATORVASTATIN CALCIUM 10 MG PO TABS
10.0000 mg | ORAL_TABLET | Freq: Every day | ORAL | Status: DC
  Administered 2017-07-19 – 2017-07-22 (×4): 10 mg via ORAL
  Filled 2017-07-19 (×4): qty 1

## 2017-07-19 MED ORDER — ONDANSETRON HCL 4 MG PO TABS: 4.0000 mg | ORAL_TABLET | Freq: Four times a day (QID) | ORAL | Status: DC | PRN

## 2017-07-19 MED ORDER — METRONIDAZOLE IN NACL 5-0.79 MG/ML-% IV SOLN
500.0000 mg | Freq: Three times a day (TID) | INTRAVENOUS | Status: DC
  Administered 2017-07-19 – 2017-07-22 (×10): 500 mg via INTRAVENOUS
  Filled 2017-07-19 (×10): qty 100

## 2017-07-19 MED ORDER — HYDROCODONE-ACETAMINOPHEN 5-325 MG PO TABS: 1.0000 | ORAL_TABLET | ORAL | Status: DC | PRN

## 2017-07-19 MED ORDER — DEXTROSE 5 % IV SOLN
2.0000 g | Freq: Every day | INTRAVENOUS | Status: DC
  Administered 2017-07-19 – 2017-07-22 (×4): 2 g via INTRAVENOUS
  Filled 2017-07-19 (×4): qty 2

## 2017-07-19 MED ORDER — BISACODYL 5 MG PO TBEC: 5.0000 mg | DELAYED_RELEASE_TABLET | Freq: Every day | ORAL | Status: DC | PRN

## 2017-07-19 MED ORDER — SODIUM CHLORIDE 0.9 % IV SOLN
INTRAVENOUS | Status: AC
  Administered 2017-07-19: 06:00:00 via INTRAVENOUS

## 2017-07-19 MED ORDER — ENOXAPARIN SODIUM 40 MG/0.4ML ~~LOC~~ SOLN
40.0000 mg | Freq: Every day | SUBCUTANEOUS | Status: DC
  Administered 2017-07-19 – 2017-07-23 (×5): 40 mg via SUBCUTANEOUS
  Filled 2017-07-19 (×5): qty 0.4

## 2017-07-19 MED ORDER — GABAPENTIN 600 MG PO TABS
600.0000 mg | ORAL_TABLET | Freq: Two times a day (BID) | ORAL | Status: DC
  Administered 2017-07-19 – 2017-07-23 (×9): 600 mg via ORAL
  Filled 2017-07-19 (×10): qty 1

## 2017-07-19 MED ORDER — TERBINAFINE HCL 1 % EX CREA
TOPICAL_CREAM | Freq: Two times a day (BID) | CUTANEOUS | Status: DC
  Administered 2017-07-19 – 2017-07-23 (×7): via TOPICAL
  Filled 2017-07-19: qty 12

## 2017-07-19 MED ORDER — VANCOMYCIN HCL IN DEXTROSE 1-5 GM/200ML-% IV SOLN
1000.0000 mg | Freq: Once | INTRAVENOUS | Status: AC
  Administered 2017-07-19: 1000 mg via INTRAVENOUS
  Filled 2017-07-19: qty 200

## 2017-07-19 MED ORDER — SENNOSIDES-DOCUSATE SODIUM 8.6-50 MG PO TABS: 1.0000 | ORAL_TABLET | Freq: Every evening | ORAL | Status: DC | PRN

## 2017-07-19 MED ORDER — GABAPENTIN 300 MG PO CAPS
600.0000 mg | ORAL_CAPSULE | Freq: Once | ORAL | Status: AC
  Administered 2017-07-19: 600 mg via ORAL
  Filled 2017-07-19: qty 2

## 2017-07-19 MED ORDER — ACETAMINOPHEN 325 MG PO TABS
650.0000 mg | ORAL_TABLET | Freq: Four times a day (QID) | ORAL | Status: DC | PRN
  Administered 2017-07-20: 650 mg via ORAL
  Filled 2017-07-19: qty 2

## 2017-07-19 MED ORDER — INSULIN ASPART 100 UNIT/ML ~~LOC~~ SOLN
0.0000 [IU] | Freq: Every day | SUBCUTANEOUS | Status: DC
  Administered 2017-07-19: 2 [IU] via SUBCUTANEOUS

## 2017-07-19 MED ORDER — INSULIN DETEMIR 100 UNIT/ML ~~LOC~~ SOLN
20.0000 [IU] | Freq: Every day | SUBCUTANEOUS | Status: DC
  Administered 2017-07-19: 20 [IU] via SUBCUTANEOUS
  Filled 2017-07-19: qty 0.2

## 2017-07-19 MED ORDER — HYDRALAZINE HCL 20 MG/ML IJ SOLN: 10.0000 mg | INTRAMUSCULAR | Status: DC | PRN

## 2017-07-19 MED ORDER — INSULIN ASPART 100 UNIT/ML ~~LOC~~ SOLN
0.0000 [IU] | Freq: Three times a day (TID) | SUBCUTANEOUS | Status: DC
  Administered 2017-07-19: 5 [IU] via SUBCUTANEOUS
  Administered 2017-07-19: 3 [IU] via SUBCUTANEOUS
  Administered 2017-07-19: 2 [IU] via SUBCUTANEOUS
  Administered 2017-07-20: 11 [IU] via SUBCUTANEOUS
  Administered 2017-07-20 – 2017-07-22 (×4): 8 [IU] via SUBCUTANEOUS
  Administered 2017-07-22 – 2017-07-23 (×2): 5 [IU] via SUBCUTANEOUS

## 2017-07-19 MED ORDER — IRBESARTAN 150 MG PO TABS
150.0000 mg | ORAL_TABLET | Freq: Every day | ORAL | Status: DC
  Administered 2017-07-19: 150 mg via ORAL
  Filled 2017-07-19: qty 1
  Filled 2017-07-19: qty 2

## 2017-07-19 MED ORDER — PRO-STAT SUGAR FREE PO LIQD
30.0000 mL | Freq: Two times a day (BID) | ORAL | Status: DC
  Administered 2017-07-19 – 2017-07-23 (×6): 30 mL via ORAL
  Filled 2017-07-19 (×8): qty 30

## 2017-07-19 MED ORDER — ACETAMINOPHEN 650 MG RE SUPP: 650.0000 mg | Freq: Four times a day (QID) | RECTAL | Status: DC | PRN

## 2017-07-19 MED ORDER — ONDANSETRON HCL 4 MG/2ML IJ SOLN
4.0000 mg | Freq: Four times a day (QID) | INTRAMUSCULAR | Status: DC | PRN
  Administered 2017-07-21: 4 mg via INTRAVENOUS

## 2017-07-19 MED ORDER — PIPERACILLIN-TAZOBACTAM 3.375 G IVPB 30 MIN
3.3750 g | Freq: Once | INTRAVENOUS | Status: AC
  Administered 2017-07-19: 3.375 g via INTRAVENOUS
  Filled 2017-07-19: qty 50

## 2017-07-19 NOTE — Consult Note (Signed)
Reason for Consult:Great toe osteo Referring Physician: B Regalado  Rebecca [name] is an 51 y.o. female with poorly controlled DM. HPI: Rebecca [name] has been struggling with a right great toe ulcer for about a year. She has been treated a few times with oral abx for it. It will wax and wane in severity. The pain has finally become severe enough that she came to the ED for evaluation. X-rays and MRI shows osteo in the distal phalanx. She also c/o some left little toe pain. Incidentally she broke her right 2nd toe and didn't realize it. It now has a deformity.  Past Medical History:  Diagnosis Date  . Anemia   . Diabetes mellitus   . Dyslipidemia   . Hypertension   . Non-STEMI (non-ST elevated myocardial infarction) [facility])    Medical therapy for distal LAD dissection    Past Surgical History:  Procedure Laterality Date  . CARDIAC CATHETERIZATION N/A 05/14/2015   Procedure: Left Heart Cath and Coronary Angiography;  Surgeon: Leonie Man, MD; dLAD 60%>>95% (small vessel, ?dissection), CFX & RCA systems no sig dz, EF & LVEDP nl       Family History  Problem Relation Age of Onset  . Hypertension Father   . Hyperlipidemia Father   . Cancer Mother   . Diabetes Sister   . Diabetes Brother   . Diabetes Sister   . Diabetes Sister   . Diabetes Sister   . Diabetes Brother   . Diabetes Brother   . Diabetes Brother   . Diabetes Brother     Social History:  reports that she has never smoked. She has never used smokeless tobacco. She reports that she does not drink alcohol or use drugs.  Allergies: No Known Allergies  Medications: I have reviewed the patient's current medications.  Results for orders placed or performed during the hospital encounter of 07/18/17 (from the past 48 hour(s))  CBC with Differential     Status: Abnormal   Collection Time: 07/18/17  8:36 PM  Result Value Ref Range   WBC 6.8 4.0 - 10.5 K/uL   RBC 4.31 3.87 - 5.11 MIL/uL   Hemoglobin 11.8 (L) 12.0 - 15.0 g/dL    HCT 35.5 (L) 36.0 - 46.0 %   MCV 82.4 78.0 - 100.0 fL   MCH 27.4 26.0 - 34.0 pg   MCHC 33.2 30.0 - 36.0 g/dL   RDW 13.5 11.5 - 15.5 %   Platelets 322 150 - 400 K/uL   Neutrophils Relative % 49 %   Neutro Abs 3.3 1.7 - 7.7 K/uL   Lymphocytes Relative 42 %   Lymphs Abs 2.9 0.7 - 4.0 K/uL   Monocytes Relative 6 %   Monocytes Absolute 0.4 0.1 - 1.0 K/uL   Eosinophils Relative 2 %   Eosinophils Absolute 0.2 0.0 - 0.7 K/uL   Basophils Relative 1 %   Basophils Absolute 0.1 0.0 - 0.1 K/uL  Basic metabolic panel     Status: Abnormal   Collection Time: 07/18/17  8:36 PM  Result Value Ref Range   Sodium 133 (L) 135 - 145 mmol/L   Potassium 4.6 3.5 - 5.1 mmol/L   Chloride 101 101 - 111 mmol/L   CO2 24 22 - 32 mmol/L   Glucose, Bld 422 (H) 65 - 99 mg/dL   BUN 39 (H) 6 - 20 mg/dL   Creatinine, Ser 1.35 (H) 0.44 - 1.00 mg/dL   Calcium 8.9 8.9 - 10.3 mg/dL   GFR calc non Af  Amer 45 (L) >60 mL/min   GFR calc Af Amer 52 (L) >60 mL/min    Comment: (NOTE) The eGFR has been calculated using the CKD EPI equation. This calculation has not been validated in all clinical situations. eGFR's persistently <60 mL/min signify possible Chronic Kidney Disease.    Anion gap 8 5 - 15  Sedimentation rate     Status: Abnormal   Collection Time: 07/18/17 11:53 PM  Result Value Ref Range   Sed Rate 54 (H) 0 - 22 mm/hr  C-reactive protein     Status: None   Collection Time: 07/18/17 11:53 PM  Result Value Ref Range   CRP <0.8 <1.0 mg/dL  CBG monitoring, ED     Status: Abnormal   Collection Time: 07/19/17 12:03 AM  Result Value Ref Range   Glucose-Capillary 235 (H) 65 - 99 mg/dL  Blood Cultures x 2 sites     Status: None (Preliminary result)   Collection Time: 07/19/17  3:30 AM  Result Value Ref Range   Specimen Description BLOOD RIGHT HAND    Special Requests      BOTTLES DRAWN AEROBIC ONLY Blood Culture adequate volume   Culture PENDING    Report Status PENDING   Hemoglobin A1c     Status:  Abnormal   Collection Time: 07/19/17  3:39 AM  Result Value Ref Range   Hgb A1c MFr Bld 8.3 (H) 4.8 - 5.6 %    Comment: (NOTE) Pre diabetes:          5.7%-6.4% Diabetes:              >6.4% Glycemic control for   <7.0% adults with diabetes    Mean Plasma Glucose 191.51 mg/dL  Prealbumin     Status: None   Collection Time: 07/19/17  3:39 AM  Result Value Ref Range   Prealbumin 26.0 18 - 38 mg/dL  Basic metabolic panel     Status: Abnormal   Collection Time: 07/19/17  3:39 AM  Result Value Ref Range   Sodium 136 135 - 145 mmol/L   Potassium 3.9 3.5 - 5.1 mmol/L   Chloride 103 101 - 111 mmol/L   CO2 25 22 - 32 mmol/L   Glucose, Bld 220 (H) 65 - 99 mg/dL   BUN 33 (H) 6 - 20 mg/dL   Creatinine, Ser 1.09 (H) 0.44 - 1.00 mg/dL   Calcium 9.1 8.9 - 10.3 mg/dL   GFR calc non Af Amer 58 (L) >60 mL/min   GFR calc Af Amer >60 >60 mL/min    Comment: (NOTE) The eGFR has been calculated using the CKD EPI equation. This calculation has not been validated in all clinical situations. eGFR's persistently <60 mL/min signify possible Chronic Kidney Disease.    Anion gap 8 5 - 15  CBC WITH DIFFERENTIAL     Status: None   Collection Time: 07/19/17  3:39 AM  Result Value Ref Range   WBC 7.2 4.0 - 10.5 K/uL   RBC 4.62 3.87 - 5.11 MIL/uL   Hemoglobin 12.7 12.0 - 15.0 g/dL   HCT 38.0 36.0 - 46.0 %   MCV 82.3 78.0 - 100.0 fL   MCH 27.5 26.0 - 34.0 pg   MCHC 33.4 30.0 - 36.0 g/dL   RDW 13.3 11.5 - 15.5 %   Platelets 304 150 - 400 K/uL   Neutrophils Relative % 49 %   Neutro Abs 3.6 1.7 - 7.7 K/uL   Lymphocytes Relative 38 %   Lymphs Abs 2.7  0.7 - 4.0 K/uL   Monocytes Relative 9 %   Monocytes Absolute 0.7 0.1 - 1.0 K/uL   Eosinophils Relative 3 %   Eosinophils Absolute 0.2 0.0 - 0.7 K/uL   Basophils Relative 1 %   Basophils Absolute 0.0 0.0 - 0.1 K/uL  Glucose, capillary     Status: Abnormal   Collection Time: 07/19/17  8:07 AM  Result Value Ref Range   Glucose-Capillary 212 (H) 65 - 99  mg/dL    Mr Toes Right Wo Contrast  Result Date: 07/19/2017 CLINICAL DATA:  Right great toe pain.  Diabetic foot ulcer. EXAM: MRI OF THE RIGHT TOES WITHOUT CONTRAST TECHNIQUE: Multiplanar, multisequence MR imaging of the right forefoot was performed. No intravenous contrast was administered. COMPARISON:  Radiographs dated 07/18/2017 FINDINGS: Bones/Joint/Cartilage There is abnormal signal from the distal phalanx of the right great toe consistent with osteomyelitis. No discrete bone destruction or joint effusion. The other bones of the forefoot appear normal. Soft tissues Focal edema in the soft tissues of the medial plantar aspect of the great toe. IMPRESSION: Findings consistent with osteomyelitis of the distal phalanx of the right great toe. Adjacent cellulitis. Electronically Signed   By: Lorriane Shire M.D.   On: 07/19/2017 07:00   Dg Toe Great Right  Result Date: 07/18/2017 CLINICAL DATA:  Diabetic ulcer EXAM: RIGHT GREAT TOE COMPARISON:  06/15/2017 FINDINGS: No fracture or malalignment. No soft tissue emphysema. Minimal cortical irregularity at the tuft of the distal phalanx with some lucency on the medial side IMPRESSION: 1. No fracture 2. Questionable cortical lucency and irregularity medial and plantar surface of the first distal phalanx, cannot exclude small foci of osteomyelitis. Electronically Signed   By: Donavan Foil M.D.   On: 07/18/2017 23:38    Review of Systems  Constitutional: Negative for weight loss.  HENT: Negative for ear discharge, ear pain, hearing loss and tinnitus.   Eyes: Negative for blurred vision, double vision, photophobia and pain.  Respiratory: Negative for cough, sputum production and shortness of breath.   Cardiovascular: Negative for chest pain.  Gastrointestinal: Negative for abdominal pain, nausea and vomiting.  Genitourinary: Negative for dysuria, flank pain, frequency and urgency.  Musculoskeletal: Positive for joint pain (Right great toe, left little  toe). Negative for back pain, falls, myalgias and neck pain.  Neurological: Negative for dizziness, tingling, sensory change, focal weakness, loss of consciousness and headaches.  Endo/Heme/Allergies: Does not bruise/bleed easily.  Psychiatric/Behavioral: Negative for depression, memory loss and substance abuse. The patient is not nervous/anxious.    Blood pressure (!) 153/67, pulse 78, temperature 98.3 F (36.8 C), temperature source Oral, resp. rate 18, height 4' 11" (1.499 m), weight 78 kg (172 lb), SpO2 100 %. Physical Exam  Constitutional: She appears well-developed and well-nourished. No distress.  HENT:  Head: Normocephalic.  Eyes: Conjunctivae are normal. Right eye exhibits no discharge. Left eye exhibits no discharge. No scleral icterus.  Neck: Normal range of motion.  Cardiovascular: Normal rate and regular rhythm.   Respiratory: Effort normal. No respiratory distress.  Musculoskeletal:  RLE No traumatic wounds or ecchymosis  Nontender  No knee or ankle effusion  Knee stable to varus/ valgus and anterior/posterior stress  Sens DPN, SPN, TN paresthetic  Motor EHL 3/5, ext, flex, evers 5/5  DP 2+, PT 2+, No significant edema             Some desquamation in 4/5 interdigital space  LLE No traumatic wounds or ecchymosis  No knee or ankle effusion  Knee  stable to varus/ valgus and anterior/posterior stress  Sens DPN, SPN, TN paresthetic  Motor EHL, ext, flex, evers 5/5  DP 2+, PT 2+, No significant edema             2nd toe with hammer deformity, feels as if PIP joint is fused             Desquamation, erythema 4/5 interdigital space  Neurological: She is alert.  Skin: Skin is warm and dry. She is not diaphoretic.  Psychiatric: She has a normal mood and affect. Her behavior is normal.    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 Sharol Given to evaluate. Check ABI's in meantime. Tinea pedis, likely bilaterally -- Will give  antifungal    Lisette Abu, PA-C Orthopedic Surgery [phone] 07/19/2017, 10:18 AM

## 2017-07-19 NOTE — Plan of Care (Signed)
Problem: Pain Managment: Goal: General experience of comfort will improve Outcome: Completed/Met Date Met: 07/19/17 Spoke with daughter to explain pain management to pt

## 2017-07-19 NOTE — ED Provider Notes (Signed)
[facility]   CSN: [id number] Arrival date & time: 07/18/17  2018  Time seen 23:20 PM   History   Chief Complaint Chief Complaint  Patient presents with  . toe pain    HPI Rebecca [name] is a 51 y.o. female.  HPI patient states she started developing a wound on the volar aspect of her right great toe about a year ago.  She has been followed at the wound center, stating that they do not do any specific treatment for it.  She was last seen about 2 weeks ago.  She states it drains clear fluid off and on however today at 2 PM it started hurting.  She denies any drainage today.  She states the last time it drained was 2 days ago.  She denies any swelling or redness of her great toe, she denies fever or chills.  She states although she has had neuropathy that was a feeling of numbness the toe has been burning since about 2 PM.  She denies having it before. She denies any known trauma.  She denies polyuria or polydipsia or weight loss. She has had diabetes for 23 years.   Patient reports she was treated for thyroiditis about 6 months ago.  [facility]   Past Medical History:  Diagnosis Date  . Anemia   . Diabetes mellitus   . Dyslipidemia   . Hypertension   . Non-STEMI (non-ST elevated myocardial infarction) [facility](HCC)    Medical therapy for distal LAD dissection    Patient Active Problem List   Diagnosis Date Noted  . Mild renal insufficiency 07/19/2017  . Cellulitis in diabetic foot (HCC) 07/19/2017  . AKI (acute kidney injury) (HCC) 03/04/2016  . Toe ulcer, right (HCC) 11/07/2015  . Toe fracture 11/07/2015  . Essential hypertension 05/14/2015  . CAD- with distal LAD disease, ? 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 05/14/2015  . NSTEMI (non-ST elevated myocardial infarction) (HCC)   . Syncope 05/13/2015  . Hyperglycemia 10/25/2014  . Abrasion of thigh, left 10/25/2014  . DM (diabetes mellitus), type 2,  uncontrolled (HCC)   . DM hyperosmolar coma, type 2 (HCC) 05/22/2012  . Hyperosmolar syndrome 05/21/2012    Past Surgical History:  Procedure Laterality Date  . CARDIAC CATHETERIZATION N/A 05/14/2015   Procedure: Left Heart Cath and Coronary Angiography;  Surgeon: Marykay Lexavid W Harding, MD; dLAD 60%>>95% (small vessel, ?dissection), CFX & RCA systems no sig dz, EF & LVEDP nl       OB History    No data available       Home Medications    Prior to Admission medications   Medication Sig Start Date End Date Taking? Authorizing Provider  aspirin EC 81 MG EC tablet Take 1 tablet (81 mg total) by mouth daily. 05/16/15   Barrett, Joline Salthonda G, PA-C  atorvastatin (LIPITOR) 80 MG tablet Take 1 tablet (80 mg total) by mouth daily at 6 PM. Patient taking differently: Take 10 mg by mouth daily at 6 PM.  05/16/15   Barrett, Joline Salthonda G, PA-C  azithromycin (ZITHROMAX) 250 MG tablet Take 1 tablet (250 mg total) by mouth daily. Take first 2 tablets together, then 1 every day until finished. 09/05/16   Donnetta Hutchingook, Brian, MD  carvedilol (COREG) 6.25 MG tablet Take 1 tablet (6.25 mg total) by mouth 2 (two) times daily with a meal. Patient not taking: Reported on 09/05/2016 05/16/15   Barrett, Joline Salthonda G, PA-C  gabapentin (NEURONTIN) 600 MG tablet  Take 2 tablets (1,200 mg total) by mouth at bedtime. Patient taking differently: Take 600 mg by mouth 2 (two) times daily.  03/19/15   Linna Hoff, MD  insulin aspart (NOVOLOG) 100 UNIT/ML injection Inject 6 Units into the skin 3 (three) times daily before meals. Patient taking differently: Inject 6 Units into the skin 2 (two) times daily.  03/19/15   Linna Hoff, MD  insulin detemir (LEVEMIR) 100 UNIT/ML injection Inject 0.42 mLs (42 Units total) into the skin at bedtime. Patient taking differently: Inject 20 Units into the skin at bedtime.  03/19/15   Linna Hoff, MD  lisinopril-hydrochlorothiazide (PRINZIDE,ZESTORETIC) 20-12.5 MG per tablet Take 1 tablet by mouth daily. For  blood pressure Patient not taking: Reported on 09/05/2016 03/19/15   Linna Hoff, MD  nitroGLYCERIN (NITROSTAT) 0.4 MG SL tablet Place 1 tablet (0.4 mg total) under the tongue every 5 (five) minutes as needed for chest pain. Patient not taking: Reported on 09/05/2016 05/16/15   Barrett, Joline Salt, PA-C  olmesartan (BENICAR) 20 MG tablet Take 20 mg by mouth daily.    [provider]  predniSONE (DELTASONE) 10 MG tablet Take 2 tablets (20 mg total) by mouth daily. 09/05/16   Donnetta Hutching, MD    Family History Family History  Problem Relation Age of Onset  . Hypertension Father   . Hyperlipidemia Father   . Cancer Mother   . Diabetes Sister   . Diabetes Brother   . Diabetes Sister   . Diabetes Sister   . Diabetes Sister   . Diabetes Brother   . Diabetes Brother   . Diabetes Brother   . Diabetes Brother     Social History Social History  Substance Use Topics  . Smoking status: Never Smoker  . Smokeless tobacco: Never Used  . Alcohol use No  unemployed   Allergies   Patient has no known allergies.   Review of Systems Review of Systems  All other systems reviewed and are negative.    Physical Exam Updated Vital Signs BP (!) 163/86   Pulse 78   Temp 98.3 F (36.8 C) (Oral)   Resp 18   Ht 4\' 11"  (1.499 m)   Wt 78 kg (172 lb)   SpO2 100%   BMI 34.74 kg/m   Physical Exam  Constitutional: She is oriented to person, place, and time. She appears well-developed and well-nourished.  Non-toxic appearance. She does not appear ill. No distress.  HENT:  Head: Normocephalic and atraumatic.  Right Ear: External ear normal.  Left Ear: External ear normal.  Nose: Nose normal. No mucosal edema or rhinorrhea.  Mouth/Throat: Mucous membranes are normal. No dental abscesses or uvula swelling.  Eyes: Conjunctivae and EOM are normal.  Neck: Normal range of motion and full passive range of motion without pain.  Cardiovascular: Normal rate.   Pulmonary/Chest: Effort  normal. No respiratory distress. She has no rhonchi. She exhibits no crepitus.  Abdominal: Normal appearance. She exhibits no distension.  Musculoskeletal: Normal range of motion. She exhibits no edema or tenderness.  Moves all extremities well.   Neurological: She is alert and oriented to person, place, and time. She has normal strength. No cranial nerve deficit.  Skin: Skin is warm, dry and intact. No rash noted. No erythema. No pallor.  Superficial ulcer on the volar aspect without drainage or redness.   Psychiatric: She has a normal mood and affect. Her speech is normal and behavior is normal. Her mood appears not anxious.  Nursing note and vitals reviewed.        ED Treatments / Results  Labs (all labs ordered are listed, but only abnormal results are displayed) Results for orders placed or performed during the hospital encounter of 07/18/17  CBC with Differential  Result Value Ref Range   WBC 6.8 4.0 - 10.5 K/uL   RBC 4.31 3.87 - 5.11 MIL/uL   Hemoglobin 11.8 (L) 12.0 - 15.0 g/dL   HCT 40.9 (L) 81.1 - 91.4 %   MCV 82.4 78.0 - 100.0 fL   MCH 27.4 26.0 - 34.0 pg   MCHC 33.2 30.0 - 36.0 g/dL   RDW 78.2 95.6 - 21.3 %   Platelets 322 150 - 400 K/uL   Neutrophils Relative % 49 %   Neutro Abs 3.3 1.7 - 7.7 K/uL   Lymphocytes Relative 42 %   Lymphs Abs 2.9 0.7 - 4.0 K/uL   Monocytes Relative 6 %   Monocytes Absolute 0.4 0.1 - 1.0 K/uL   Eosinophils Relative 2 %   Eosinophils Absolute 0.2 0.0 - 0.7 K/uL   Basophils Relative 1 %   Basophils Absolute 0.1 0.0 - 0.1 K/uL  Basic metabolic panel  Result Value Ref Range   Sodium 133 (L) 135 - 145 mmol/L   Potassium 4.6 3.5 - 5.1 mmol/L   Chloride 101 101 - 111 mmol/L   CO2 24 22 - 32 mmol/L   Glucose, Bld 422 (H) 65 - 99 mg/dL   BUN 39 (H) 6 - 20 mg/dL   Creatinine, Ser 0.86 (H) 0.44 - 1.00 mg/dL   Calcium 8.9 8.9 - 57.8 mg/dL   GFR calc non Af Amer 45 (L) >60 mL/min   GFR calc Af Amer 52 (L) >60 mL/min   Anion gap 8 5 - 15   Sedimentation rate  Result Value Ref Range   Sed Rate 54 (H) 0 - 22 mm/hr  C-reactive protein  Result Value Ref Range   CRP <0.8 <1.0 mg/dL  CBG monitoring, ED  Result Value Ref Range   Glucose-Capillary 235 (H) 65 - 99 mg/dL   Laboratory interpretation all normal except hyperglycemia (improved without treatment), elevation of SED rate with normal CRP., renal insufficiency    EKG  EKG Interpretation None       Radiology Dg Toe Great Right  Result Date: 07/18/2017 CLINICAL DATA:  Diabetic ulcer EXAM: RIGHT GREAT TOE COMPARISON:  06/15/2017 FINDINGS: No fracture or malalignment. No soft tissue emphysema. Minimal cortical irregularity at the tuft of the distal phalanx with some lucency on the medial side IMPRESSION: 1. No fracture 2. Questionable cortical lucency and irregularity medial and plantar surface of the first distal phalanx, cannot exclude small foci of osteomyelitis. Electronically Signed   By: Jasmine Pang M.D.   On: 07/18/2017 23:38    Procedures Procedures (including critical care time)  Medications Ordered in ED Medications  piperacillin-tazobactam (ZOSYN) IVPB 3.375 g (0 g Intravenous Stopped 07/19/17 0124)  vancomycin (VANCOCIN) IVPB 1000 mg/200 mL premix (0 mg Intravenous Stopped 07/19/17 0231)  gabapentin (NEURONTIN) capsule 600 mg (600 mg Oral Given 07/19/17 0059)     Initial Impression / Assessment and Plan / ED Course  I have reviewed the triage vital signs and the nursing notes.  Pertinent labs & imaging results that were available during my care of the patient were reviewed by me and considered in my medical decision making (see chart for details).      We discussed her test results after my interview,  I reviewed her x-ray prior to the radiologist reading and I was concerned that there could be some osteomyelitis.  After reviewing her x-ray I ordered a sed rate and CRP to her blood work.  After reviewing the radiologist's reading of her x-ray  she was started on Zosyn and vancomycin for possible osteomyelitis.  She was given Neurontin 600 mg orally.  Recheck at 2:40 AM patient states she is feeling a little better.  We discussed her final test results that were still pending.  She is agreeable for admission.  03:09 AM Dr Antionette Char, hospitalist will admit.    Final Clinical Impressions(s) / ED Diagnoses   Final diagnoses:  Osteomyelitis of right foot, unspecified type [facility])    Plan admission  Devoria Albe, MD, Concha Pyo, MD 07/19/17 [phone]

## 2017-07-19 NOTE — Progress Notes (Signed)
PROGRESS NOTE    Rebecca [name]  PJA:[id number] DOB: [birth year] DOA: 07/18/2017 PCP: [facility], [facility]     Brief Narrative: Rebecca [name] is a 51 y.o. female with medical history significant for insulin-dependent diabetes mellitus, coronary artery disease, and hypertension, now presenting to the emergency department for evaluation of throbbing right great toe pain.  Patient reports that she has had an ulcer at the base of the right first toe for about a year now.  She reports that it had been draining recently and has become increasingly painful.  She suffers from diabetic neuropathy and often has associated pain in both feet, but now reports a severe throbbing pain that is new involving the right first toe.  No fevers or chills are reported.  She had been treated with antibiotics for this previously.  No chest pain or palpitations.  ED Course: Upon arrival to the ED, patient is found to be afebrile, saturating well on room air, and with vitals otherwise stable.  Radiographs of the right foot features are questionable cortical lucency and irregularity at the medial and plantar aspect of the first right phalanx, possibly reflecting a small foci of osteomyelitis.  Chemistry panel reveals a sodium of 133, BUN 39, glucose 422, and serum creatinine 1.35, similar to priors.  CBC is notable for a stable chronic normocytic anemia with hemoglobin of 11.8.  ESR is mildly elevated at 54 and CRP is negative.  Patient was treated with vancomycin and Zosyn in the emergency department, remained hemodynamically stable and in no apparent respiratory distress, and will be observed in the medical-surgical unit for ongoing evaluation and management of toe ulcer with concern for underlying osteomyelitis.   Assessment & Plan:   Principal Problem:   Toe ulcer, right (HCC) Active Problems:   DM (diabetes mellitus), type 2, uncontrolled ([place])   Essential hypertension   CAD- with distal LAD  disease, ? 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   Mild renal insufficiency   Cellulitis in diabetic foot (HCC)   Diabetic foot ulcer ([place])  1-Non Healing Diabetic Toe Ulcer, Osteomyelitis;  She report last time she took oral antibiotics was 6 month ago. Over last 2 months she started to have problem again with ulcer, it has open , and has been draining bloody secretion. She presented with worsening pain.  MRI consistent with osteomyelitis distal phalanges great toe.  Continue with IV antibiotics.  Follow ABI.  Ortho consulted.  Follow Blood culture.   2-Insulin DM type 1; On levemir while inpatient.  SSI.   3-Acute Renal Insufficiency;  Hold ARB.  Improved. Follow trend.   4-HTN; hold ARB due to AKI.  Start Norvasc.     CAD  - No anginal complaints  - Continue Lipitor and ARB         DVT prophylaxis: Lovenox.  Code Status: full code.  Family Communication: care discussed with daughter  Disposition Plan: remain inpatient. [facility]me at time of discharge   Consultants:   ortho   Procedures:   ABI   Antimicrobials: Ceftriaxone and flagyl./    Subjective: She report pain great toe worse over last two month.  Last time she was on antibiotics was 6 months ago.   Objective: Vitals:   07/19/17 0230 07/19/17 0539 07/19/17 0540 07/19/17 0600  BP: (!) 163/86 123/75  (!) 153/67  Pulse: 78  80 78  Resp:      Temp:      TempSrc:      SpO2: 100%  100% 100%  Weight:      Height:        Intake/Output Summary (Last 24 hours) at 07/19/17 0810 Last data filed at 07/19/17 0231  Gross per 24 hour  Intake              250 ml  Output                0 ml  Net              250 ml   Filed Weights   07/18/17 2025  Weight: 78 kg (172 lb)    Examination:  General exam: Appears calm and comfortable  Respiratory system: Clear to auscultation. Respiratory effort normal. Cardiovascular system: S1 & S2 heard, RRR. No JVD, murmurs, rubs, gallops or clicks. No pedal edema. Gastrointestinal  system: Abdomen is nondistended, soft and nontender. No organomegaly or masses felt. Normal bowel sounds heard. Central nervous system: Alert and oriented. No focal neurological deficits. Extremities: Symmetric 5 x 5 power. Skin:right great toe with ulcer plantar aspect, no drainage notice. Swollen great toe.   Data Reviewed: I have personally reviewed following labs and imaging studies  CBC:  Recent Labs Lab 07/18/17 2036 07/19/17 0339  WBC 6.8 7.2  NEUTROABS 3.3 3.6  HGB 11.8* 12.7  HCT 35.5* 38.0  MCV 82.4 82.3  PLT 322 478   Basic Metabolic Panel:  Recent Labs Lab 07/18/17 2036 07/19/17 0339  NA 133* 136  K 4.6 3.9  CL 101 103  CO2 24 25  GLUCOSE 422* 220*  BUN 39* 33*  CREATININE 1.35* 1.09*  CALCIUM 8.9 9.1   GFR: Estimated Creatinine Clearance: 55 mL/min (A) (by C-G formula based on SCr of 1.09 mg/dL (H)). Liver Function Tests: No results for input(s): AST, ALT, ALKPHOS, BILITOT, PROT, ALBUMIN in the last 168 hours. No results for input(s): LIPASE, AMYLASE in the last 168 hours. No results for input(s): AMMONIA in the last 168 hours. Coagulation Profile: No results for input(s): INR, PROTIME in the last 168 hours. Cardiac Enzymes: No results for input(s): CKTOTAL, CKMB, CKMBINDEX, TROPONINI in the last 168 hours. BNP (last 3 results) No results for input(s): PROBNP in the last 8760 hours. HbA1C:  Recent Labs  07/19/17 0339  HGBA1C 8.3*   CBG:  Recent Labs Lab 07/19/17 0003  GLUCAP 235*   Lipid Profile: No results for input(s): CHOL, HDL, LDLCALC, TRIG, CHOLHDL, LDLDIRECT in the last 72 hours. Thyroid Function Tests: No results for input(s): TSH, T4TOTAL, FREET4, T3FREE, THYROIDAB in the last 72 hours. Anemia Panel: No results for input(s): VITAMINB12, FOLATE, FERRITIN, TIBC, IRON, RETICCTPCT in the last 72 hours. Sepsis Labs: No results for input(s): PROCALCITON, LATICACIDVEN in the last 168 hours.  Recent Results (from the past 240  hour(s))  Blood Cultures x 2 sites     Status: None (Preliminary result)   Collection Time: 07/19/17  3:30 AM  Result Value Ref Range Status   Specimen Description BLOOD RIGHT HAND  Final   Special Requests   Final    BOTTLES DRAWN AEROBIC ONLY Blood Culture adequate volume   Culture PENDING  Incomplete   Report Status PENDING  Incomplete         Radiology Studies: Mr Toes Right Wo Contrast  Result Date: 07/19/2017 CLINICAL DATA:  Right great toe pain.  Diabetic foot ulcer. EXAM: MRI OF THE RIGHT TOES WITHOUT CONTRAST TECHNIQUE: Multiplanar, multisequence MR imaging of the right forefoot was performed. No intravenous contrast was administered. COMPARISON:  Radiographs dated 07/18/2017  FINDINGS: Bones/Joint/Cartilage There is abnormal signal from the distal phalanx of the right great toe consistent with osteomyelitis. No discrete bone destruction or joint effusion. The other bones of the forefoot appear normal. Soft tissues Focal edema in the soft tissues of the medial plantar aspect of the great toe. IMPRESSION: Findings consistent with osteomyelitis of the distal phalanx of the right great toe. Adjacent cellulitis. Electronically Signed   By: Lorriane Shire M.D.   On: 07/19/2017 07:00   Dg Toe Great Right  Result Date: 07/18/2017 CLINICAL DATA:  Diabetic ulcer EXAM: RIGHT GREAT TOE COMPARISON:  06/15/2017 FINDINGS: No fracture or malalignment. No soft tissue emphysema. Minimal cortical irregularity at the tuft of the distal phalanx with some lucency on the medial side IMPRESSION: 1. No fracture 2. Questionable cortical lucency and irregularity medial and plantar surface of the first distal phalanx, cannot exclude small foci of osteomyelitis. Electronically Signed   By: Donavan Foil M.D.   On: 07/18/2017 23:38        Scheduled Meds: . atorvastatin  10 mg Oral q1800  . enoxaparin (LOVENOX) injection  40 mg Subcutaneous Daily  . gabapentin  600 mg Oral BID  . insulin aspart  0-15  Units Subcutaneous TID WC  . insulin aspart  0-5 Units Subcutaneous QHS  . insulin detemir  20 Units Subcutaneous QHS  . irbesartan  150 mg Oral Daily   Continuous Infusions: . sodium chloride 100 mL/hr at 07/19/17 0551  . cefTRIAXone (ROCEPHIN)  IV Stopped (07/19/17 0615)   And  . metronidazole 500 mg (07/19/17 0620)     LOS: 0 days    Time spent: 35 minutes.     Elmarie Shiley, MD [facility] Pager [phone]  If 7PM-7AM, please contact night-coverage [email or web address] Password [email or web address] 07/19/2017, 8:10 AM

## 2017-07-19 NOTE — Progress Notes (Signed)
VASCULAR LAB PRELIMINARY  PRELIMINARY  PRELIMINARY  PRELIMINARY  ABI  completed.    Preliminary report may be found under results review.  Eduin Friedel, RVT 07/19/2017, 11:47 AM

## 2017-07-19 NOTE — Consult Note (Signed)
ORTHOPAEDIC CONSULTATION  REQUESTING PHYSICIAN: Rebecca [name], Rebecca A, MD  Chief Complaint: Chronic ulceration right great toe  HPI: Rebecca [name] is [name] 51 y.o. female who presents with diabetic insensate neuropathy.  Patient is undergone over 1 year treatment for ulceration redness and swelling of right great toe.  Past Medical History:  Diagnosis Date  . Anemia   . Diabetes mellitus   . Dyslipidemia   . Hypertension   . Non-STEMI (non-ST elevated myocardial infarction) [facility](HCC)    Medical therapy for distal LAD dissection   Past Surgical History:  Procedure Laterality Date  . CARDIAC CATHETERIZATION N/[name] 05/14/2015   Procedure: Left Heart Cath and Coronary Angiography;  Surgeon: Rebecca Lexavid W Harding, MD; dLAD 60%>>95% (small vessel, ?dissection), CFX & RCA systems no sig dz, EF & LVEDP nl      Social History   Social History  . Marital status: Married    Spouse name: N/[name]  . Number of children: N/[name]  . Years of education: N/[name]   Social History Main Topics  . Smoking status: Never Smoker  . Smokeless tobacco: Never Used  . Alcohol use No  . Drug use: No  . Sexual activity: Yes   Other Topics Concern  . None   Social History Narrative  . None   Family History  Problem Relation Age of Onset  . Hypertension Father   . Hyperlipidemia Father   . Cancer Mother   . Diabetes Sister   . Diabetes Brother   . Diabetes Sister   . Diabetes Sister   . Diabetes Sister   . Diabetes Brother   . Diabetes Brother   . Diabetes Brother   . Diabetes Brother    - negative except otherwise stated in the family history section No Known Allergies Prior to Admission medications   Medication Sig Start Date End Date Taking? Authorizing Provider  aspirin EC 81 MG EC tablet Take 1 tablet (81 mg total) by mouth daily. 05/16/15  Yes Barrett, Joline Salthonda G, PA-C  atorvastatin (LIPITOR) 80 MG tablet Take 1 tablet (80 mg total) by mouth daily at 6 PM. Patient taking differently: Take 10 mg by mouth  daily at 6 PM.  05/16/15  Yes Barrett, Joline Salthonda G, PA-C  gabapentin (NEURONTIN) 600 MG tablet Take 2 tablets (1,200 mg total) by mouth at bedtime. Patient taking differently: Take 600 mg by mouth 2 (two) times daily.  03/19/15  Yes Kindl, Quita SkyeJames D, MD  insulin aspart protamine- aspart (NOVOLOG MIX 70/30) (70-30) 100 UNIT/ML injection Inject 30-40 Units into the skin 2 (two) times daily. 40 units in the morning and 30 units in the evening   Yes [provider]  nitroGLYCERIN (NITROSTAT) 0.4 MG SL tablet Place 1 tablet (0.4 mg total) under the tongue every 5 (five) minutes as needed for chest pain. 05/16/15  Yes Barrett, Joline Salthonda G, PA-C  olmesartan (BENICAR) 20 MG tablet Take 20 mg by mouth daily.   Yes [provider]   Mr Toes Right Wo Contrast  Result Date: 07/19/2017 CLINICAL DATA:  Right great toe pain.  Diabetic foot ulcer. EXAM: MRI OF THE RIGHT TOES WITHOUT CONTRAST TECHNIQUE: Multiplanar, multisequence MR imaging of the right forefoot was performed. No intravenous contrast was administered. COMPARISON:  Radiographs dated 07/18/2017 FINDINGS: Bones/Joint/Cartilage There is abnormal signal from the distal phalanx of the right great toe consistent with osteomyelitis. No discrete bone destruction or joint effusion. The other bones of the forefoot appear normal. Soft tissues Focal edema in the soft  tissues of the medial plantar aspect of the great toe. IMPRESSION: Findings consistent with osteomyelitis of the distal phalanx of the right great toe. Adjacent cellulitis. Electronically Signed   By: Rebecca [name] M.[name].   On: 07/19/2017 07:00   Dg Toe Great Right  Result Date: 07/18/2017 CLINICAL DATA:  Diabetic ulcer EXAM: RIGHT GREAT TOE COMPARISON:  06/15/2017 FINDINGS: No fracture or malalignment. No soft tissue emphysema. Minimal cortical irregularity at the tuft of the distal phalanx with some lucency on the medial side IMPRESSION: 1. No fracture 2. Questionable cortical lucency and  irregularity medial and plantar surface of the first distal phalanx, cannot exclude small foci of osteomyelitis. Electronically Signed   By: Rebecca [name] M.[name].   On: 07/18/2017 23:38   - pertinent xrays, CT, MRI studies were reviewed and independently interpreted  Positive ROS: All other systems have been reviewed and were otherwise negative with the exception of those mentioned in the HPI and as above.  Physical Exam: General: Alert, no acute distress Psychiatric: Patient is competent for consent with normal mood and affect Lymphatic: No axillary or cervical lymphadenopathy Cardiovascular: No pedal edema Respiratory: No cyanosis, no use of accessory musculature GI: No organomegaly, abdomen is soft and non-tender  Skin: Examination there is cellulitis swelling and ulceration of the right great toe there is no ascending cellulitis.   Neurologic: Patient does not have protective sensation bilateral lower extremities.   MUSCULOSKELETAL:  Patient has [name] strong dorsalis pedis pulse.  Good range of motion of the ankle and subtalar joint.  There is no tenderness to palpation at the MTP joint of the great toe.  Review of the radiographs and the MRI scan shows osteomyelitis involving the entire distal phalanx of the right great toe.  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  Plan: Plan: Will plan for amputation of the right great toe through the MTP joint.  There is no operating room time available on Wednesday I will plan for surgery on Friday continue IV antibiotics until 24 hours postoperatively from surgery.  Patient may be able to discharge to home once she is safe with physical therapy would prefer nonweightbearing on the right lower extremity but touchdown weightbearing on the heel would be acceptable.  Thank you for the consult and the opportunity to see [name], MD [facility] [phone] 5:41  PM

## 2017-07-19 NOTE — Consult Note (Signed)
WOC Nurse wound consult note Reason for Consult: Osteomyelitis to right great toe, present on admission.  Calloused lesion to left fifth metatarsal, lateral aspect.   Wound type:neuropathic injury Pressure Injury POA: Yes Measurement: Right great toe with calloused lesion, 1 cm x 1 cm x 0.2 cm nonintact center.  Left lateral fifth metatarsal 0.3 cm soft calloused lesion.  Dry and intact Wound YQI:HKVQQVZDGbed:calloused Drainage (amount, consistency, odor) none Periwound:intact, right toe is tender to touch Dressing procedure/placement/frequency:adult daughter at bedside and assists with translation.  Patient states that she often trims the callous herself and sees MD for her diabetes but not a podiatrist.  Explained to patient via her daughter that due to her diabetes she should see the podiatrist to trim callouses to avoid infection.  Discussed impaired wound healing with diabetes.. Both daughter and patient verbalize understanding.  Denies any further questions.  Xeroform gauze daily while systemic antibiotics address osteomyelitis.  Will not follow at this time.  Please re-consult if needed.  Maple HudsonKaren Simrit Gohlke RN BSN CWON Pager [phone]

## 2017-07-19 NOTE — H&P (Signed)
History and Physical    Rebecca [name] IZT:[id number] DOB: 28-Feb-1966 DOA: 07/18/2017  PCP: [facility]   Patient coming from: Home  Chief Complaint: Right great toe pain  HPI: Rebecca [name] is a 51 y.o. female with medical history significant for insulin-dependent diabetes mellitus, coronary artery disease, and hypertension, now presenting to the emergency department for evaluation of throbbing right great toe pain.  Patient reports that she has had an ulcer at the base of the right first toe for about a year now.  She reports that it had been draining recently and has become increasingly painful.  She suffers from diabetic neuropathy and often has associated pain in both feet, but now reports a severe throbbing pain that is new involving the right first toe.  No fevers or chills are reported.  She had been treated with antibiotics for this previously.  No chest pain or palpitations.  ED Course: Upon arrival to the ED, patient is found to be afebrile, saturating well on room air, and with vitals otherwise stable.  Radiographs of the right foot features are questionable cortical lucency and irregularity at the medial and plantar aspect of the first right phalanx, possibly reflecting a small foci of osteomyelitis.  Chemistry panel reveals a sodium of 133, BUN 39, glucose 422, and serum creatinine 1.35, similar to priors.  CBC is notable for a stable chronic normocytic anemia with hemoglobin of 11.8.  ESR is mildly elevated at 54 and CRP is negative.  Patient was treated with vancomycin and Zosyn in the emergency department, remained hemodynamically stable and in no apparent respiratory distress, and will be observed in the medical-surgical unit for ongoing evaluation and management of toe ulcer with concern for underlying osteomyelitis.  Review of Systems:  All other systems reviewed and apart from HPI, are negative.  Past Medical History:  Diagnosis Date  . Anemia    . Diabetes mellitus   . Dyslipidemia   . Hypertension   . Non-STEMI (non-ST elevated myocardial infarction) [facility])    Medical therapy for distal LAD dissection    Past Surgical History:  Procedure Laterality Date  . CARDIAC CATHETERIZATION N/A 05/14/2015   Procedure: Left Heart Cath and Coronary Angiography;  Surgeon: Leonie Man, MD; dLAD 60%>>95% (small vessel, ?dissection), CFX & RCA systems no sig dz, EF & LVEDP nl        reports that she has never smoked. She has never used smokeless tobacco. She reports that she does not drink alcohol or use drugs.  No Known Allergies  Family History  Problem Relation Age of Onset  . Hypertension Father   . Hyperlipidemia Father   . Cancer Mother   . Diabetes Sister   . Diabetes Brother   . Diabetes Sister   . Diabetes Sister   . Diabetes Sister   . Diabetes Brother   . Diabetes Brother   . Diabetes Brother   . Diabetes Brother      Prior to Admission medications   Medication Sig Start Date End Date Taking? Authorizing Provider  aspirin EC 81 MG EC tablet Take 1 tablet (81 mg total) by mouth daily. 05/16/15   Barrett, Evelene Croon, PA-C  atorvastatin (LIPITOR) 80 MG tablet Take 1 tablet (80 mg total) by mouth daily at 6 PM. Patient taking differently: Take 10 mg by mouth daily at 6 PM.  05/16/15   Barrett, Evelene Croon, PA-C  azithromycin (ZITHROMAX) 250 MG tablet Take 1 tablet (250 mg total)  by mouth daily. Take first 2 tablets together, then 1 every day until finished. 09/05/16   Nat Christen, MD  carvedilol (COREG) 6.25 MG tablet Take 1 tablet (6.25 mg total) by mouth 2 (two) times daily with a meal. Patient not taking: Reported on 09/05/2016 05/16/15   Barrett, Evelene Croon, PA-C  gabapentin (NEURONTIN) 600 MG tablet Take 2 tablets (1,200 mg total) by mouth at bedtime. Patient taking differently: Take 600 mg by mouth 2 (two) times daily.  03/19/15   Billy Fischer, MD  insulin aspart (NOVOLOG) 100 UNIT/ML injection Inject 6 Units into the  skin 3 (three) times daily before meals. Patient taking differently: Inject 6 Units into the skin 2 (two) times daily.  03/19/15   Billy Fischer, MD  insulin detemir (LEVEMIR) 100 UNIT/ML injection Inject 0.42 mLs (42 Units total) into the skin at bedtime. Patient taking differently: Inject 20 Units into the skin at bedtime.  03/19/15   Billy Fischer, MD  lisinopril-hydrochlorothiazide (PRINZIDE,ZESTORETIC) 20-12.5 MG per tablet Take 1 tablet by mouth daily. For blood pressure Patient not taking: Reported on 09/05/2016 03/19/15   Billy Fischer, MD  nitroGLYCERIN (NITROSTAT) 0.4 MG SL tablet Place 1 tablet (0.4 mg total) under the tongue every 5 (five) minutes as needed for chest pain. Patient not taking: Reported on 09/05/2016 05/16/15   Barrett, Evelene Croon, PA-C  olmesartan (BENICAR) 20 MG tablet Take 20 mg by mouth daily.    [provider]  predniSONE (DELTASONE) 10 MG tablet Take 2 tablets (20 mg total) by mouth daily. 09/05/16   Nat Christen, MD    Physical Exam: Vitals:   07/19/17 0130 07/19/17 0145 07/19/17 0215 07/19/17 0230  BP: (!) 170/92 (!) 149/83 (!) 159/85 (!) 163/86  Pulse: 79 79 79 78  Resp:      Temp:      TempSrc:      SpO2: 100% 100% 100% 100%  Weight:      Height:          Constitutional: NAD, calm  Eyes: PERTLA, lids and conjunctivae normal ENMT: Mucous membranes are moist. Posterior pharynx clear of any exudate or lesions.   Neck: normal, supple, no masses, no thyromegaly Respiratory: clear to auscultation bilaterally, no wheezing, no crackles. Normal respiratory effort.   Cardiovascular: S1 & S2 heard, regular rate and rhythm. No extremity edema. No significant JVD. Abdomen: No distension, no tenderness, no masses palpated. Bowel sounds normal.  Musculoskeletal: no clubbing / cyanosis. No joint deformity upper and lower extremities.   Skin: ulcer at medial aspect of plantar right great toe surrounded by thick callous with edema, no drainage. Skin is  otherwise warm, dry, well-perfused. Neurologic: CN 2-12 grossly intact. Sensation intact. Strength 5/5 in all 4 limbs.  Psychiatric:  Alert and oriented x 3. Pleasant and cooperative       Labs on Admission: I have personally reviewed following labs and imaging studies  CBC:  Recent Labs Lab 07/18/17 2036  WBC 6.8  NEUTROABS 3.3  HGB 11.8*  HCT 35.5*  MCV 82.4  PLT 573   Basic Metabolic Panel:  Recent Labs Lab 07/18/17 2036  NA 133*  K 4.6  CL 101  CO2 24  GLUCOSE 422*  BUN 39*  CREATININE 1.35*  CALCIUM 8.9   GFR: Estimated Creatinine Clearance: 44.4 mL/min (A) (by C-G formula based on SCr of 1.35 mg/dL (H)). Liver Function Tests: No results for input(s): AST, ALT, ALKPHOS, BILITOT, PROT, ALBUMIN in the last 168 hours. No  results for input(s): LIPASE, AMYLASE in the last 168 hours. No results for input(s): AMMONIA in the last 168 hours. Coagulation Profile: No results for input(s): INR, PROTIME in the last 168 hours. Cardiac Enzymes: No results for input(s): CKTOTAL, CKMB, CKMBINDEX, TROPONINI in the last 168 hours. BNP (last 3 results) No results for input(s): PROBNP in the last 8760 hours. HbA1C: No results for input(s): HGBA1C in the last 72 hours. CBG:  Recent Labs Lab 07/19/17 0003  GLUCAP 235*   Lipid Profile: No results for input(s): CHOL, HDL, LDLCALC, TRIG, CHOLHDL, LDLDIRECT in the last 72 hours. Thyroid Function Tests: No results for input(s): TSH, T4TOTAL, FREET4, T3FREE, THYROIDAB in the last 72 hours. Anemia Panel: No results for input(s): VITAMINB12, FOLATE, FERRITIN, TIBC, IRON, RETICCTPCT in the last 72 hours. Urine analysis:    Component Value Date/Time   COLORURINE YELLOW 03/05/2016 [place] 03/05/2016 0457   LABSPEC 1.017 03/05/2016 0457   PHURINE 5.5 03/05/2016 0457   GLUCOSEU 500 (A) 03/05/2016 0457   HGBUR NEGATIVE 03/05/2016 0457   BILIRUBINUR NEGATIVE 03/05/2016 0457   KETONESUR NEGATIVE 03/05/2016 0457     PROTEINUR NEGATIVE 03/05/2016 0457   UROBILINOGEN 0.2 05/13/2015 0411   NITRITE NEGATIVE 03/05/2016 0457   LEUKOCYTESUR TRACE (A) 03/05/2016 0457   Sepsis Labs: _0 (procalcitonin:4,lacticidven:4) )No results found for this or any previous visit (from the past 240 hour(s)).   Radiological Exams on Admission: Dg Toe Great Right  Result Date: 07/18/2017 CLINICAL DATA:  Diabetic ulcer EXAM: RIGHT GREAT TOE COMPARISON:  06/15/2017 FINDINGS: No fracture or malalignment. No soft tissue emphysema. Minimal cortical irregularity at the tuft of the distal phalanx with some lucency on the medial side IMPRESSION: 1. No fracture 2. Questionable cortical lucency and irregularity medial and plantar surface of the first distal phalanx, cannot exclude small foci of osteomyelitis. Electronically Signed   By: Donavan Foil M.D.   On: 07/18/2017 23:38    EKG: Not performed.   Assessment/Plan  1. Non-healing diabetic toe ulcer, ?osteomyelitis  - Pt presents with pain and drainage from a non-healing ulcer at plantar aspect of right 1st toe  - Radiographs suggest possible osteomyelitis  - She was was treated with vancomycin and Zosyn in ED  - Plan to check inflammatory markers, ABI, and MRI   - Continue abx with Rocephin and Flagyl; if MRI positive for osteo, will likely resume vancomycin   2. Insulin-dependent DM  - A1c was 10.8% two years ago  - Serum glucose 422 on admission, improved to 220 with IVF  - Managed at home with Levemir 20 units qHS and Novolog 6 units BID  - Continue Levemir 20 units qHS with moderate-intensity SSI    3. Renal insufficiency  - SCr is 1.35 on admission with no recent priors  - Plan to provide a gentle IVF hydration and repeat chemistries in am    4. Hypertension  - BP elevated in ED, pain may be contributing  - Continue ARB, use hydralazine IVP's prn   5. CAD  - No anginal complaints  - Continue Lipitor and ARB   DVT prophylaxis: Lovenox  Code Status:  Full  Family Communication: Discussed with patient Disposition Plan: Observe on med-surg Consults called: None Admission status: Observation    Vianne Bulls, MD [facility] Pager [phone]  If 7PM-7AM, please contact night-coverage [email or web address] Password [email or web address]  07/19/2017, 3:18 AM

## 2017-07-19 NOTE — ED Notes (Signed)
Patient transported to MRI 

## 2017-07-19 NOTE — Progress Notes (Signed)
Initial Nutrition Assessment  DOCUMENTATION CODES:   Obesity unspecified  INTERVENTION:  - Provide Pro-Stat BID, each supplement provides 100 calories and 15 grams protein  NUTRITION DIAGNOSIS:   Increased nutrient needs related to wound healing as evidenced by estimated needs.  GOAL:   Patient will meet greater than or equal to 90% of their needs  MONITOR:   PO intake, Supplement acceptance, Labs, Weight trends, I & O's, Skin  REASON FOR ASSESSMENT:   Consult Wound healing  ASSESSMENT:   Pt with PMH of DM, HTN, dyslipidemia, CAD presents with DM ulcer at R toe   Pt's daughters at bedside assisted in collecting pt's nutritional history. Per report pt had a great appetite PTA and has been trying to eat "healthier" and less. Per pt's daughter's pt does not like the taste of the food available here, per meal completion records pt consumed 75% of breakfast this morning.  Per daughters, pt may have gained a little weight but believe it is r/t fluid retention. Promoted adequate protein consumption to aid in wound healing.   Labs reviewed; CBG 182-235, Hemoglobin A1C 8.3 Medications reviewed; sliding scale insulin, Levemir  NUTRITION - FOCUSED PHYSICAL EXAM:    Most Recent Value  Orbital Region  No depletion  Upper Arm Region  No depletion  Thoracic and Lumbar Region  No depletion  Buccal Region  No depletion  Temple Region  No depletion  Clavicle Bone Region  No depletion  Clavicle and Acromion Bone Region  No depletion  Scapular Bone Region  No depletion  Dorsal Hand  No depletion  Patellar Region  No depletion  Anterior Thigh Region  No depletion  Posterior Calf Region  No depletion  Edema (RD Assessment)  None      Diet Order:  Diet heart healthy/carb modified Room service appropriate? Yes; Fluid consistency: Thin  EDUCATION NEEDS:   No education needs have been identified at this time  Skin:  Skin Assessment: Skin Integrity Issues: Skin Integrity  Issues:: Diabetic Ulcer Diabetic Ulcer: at toe  Last BM:  No recorded BM date  Height:   Ht Readings from Last 1 Encounters:  07/18/17 4\' 11"  (1.499 m)    Weight:   Wt Readings from Last 1 Encounters:  07/18/17 172 lb (78 kg)    Ideal Body Weight:  44.5 kg  BMI:  Body mass index is 34.74 kg/m.  Estimated Nutritional Needs:   Kcal:  1560-1716  Protein:  95-115 grams  Fluid:  >/= 1.5 L/d  Fransisca KaufmannAllison Ioannides, MS, RDN, LDN 07/19/2017 4:14 PM

## 2017-07-20 LAB — GLUCOSE, CAPILLARY
GLUCOSE-CAPILLARY: 333 mg/dL — AB (ref 65–99)
GLUCOSE-CAPILLARY: 95 mg/dL (ref 65–99)
Glucose-Capillary: 265 mg/dL — ABNORMAL HIGH (ref 65–99)
Glucose-Capillary: 157 mg/dL — ABNORMAL HIGH (ref 65–99)

## 2017-07-20 MED ORDER — INSULIN ASPART 100 UNIT/ML ~~LOC~~ SOLN
4.0000 [IU] | Freq: Three times a day (TID) | SUBCUTANEOUS | Status: DC
  Administered 2017-07-20 – 2017-07-23 (×6): 4 [IU] via SUBCUTANEOUS

## 2017-07-20 MED ORDER — INSULIN DETEMIR 100 UNIT/ML ~~LOC~~ SOLN
26.0000 [IU] | Freq: Every day | SUBCUTANEOUS | Status: DC
  Administered 2017-07-20 – 2017-07-21 (×2): 26 [IU] via SUBCUTANEOUS
  Filled 2017-07-20 (×2): qty 0.26

## 2017-07-20 MED ORDER — AMLODIPINE BESYLATE 5 MG PO TABS
5.0000 mg | ORAL_TABLET | Freq: Every day | ORAL | Status: DC
  Administered 2017-07-20 – 2017-07-23 (×4): 5 mg via ORAL
  Filled 2017-07-20 (×5): qty 1

## 2017-07-20 NOTE — Care Management Note (Signed)
Case Management Note  Patient Details  Name: Rebecca [name] MRN: [id number] Date of Birth: 05/31/1966  Subjective/Objective:                 Patient admitted from home w family. Plan for amputation of the right great toe at the MTP joint on Friday morning.  PCP TAP[facility]   Action/Plan:  CM will continue to follow for DC planning needs.  Expected Discharge Date:                  Expected Discharge Plan:  Home/Self Care  In-House Referral:     Discharge planning Services  CM Consult  Post Acute Care Choice:    Choice offered to:     DME Arranged:    DME Agency:     HH Arranged:    HH Agency:     Status of Service:  In process, will continue to follow  If discussed at Long Length of Stay Meetings, dates discussed:    Additional Comments:  Lawerance SabalDebbie Alex Mcmanigal, RN 07/20/2017, 4:08 PM

## 2017-07-20 NOTE — Progress Notes (Signed)
Inpatient Diabetes Program Recommendations  AACE/ADA: New Consensus Statement on Inpatient Glycemic Control (2015)  Target Ranges:  Prepandial:   less than 140 mg/dL      Peak postprandial:   less than 180 mg/dL (1-2 hours)      Critically ill patients:  140 - 180 mg/dL   Lab Results  Component Value Date   GLUCAP 333 (H) 07/20/2017   HGBA1C 8.3 (H) 07/19/2017    Review of Glycemic ControlResults for [name], Caoilainn I (MRN [id number]) as of 07/20/2017 14:31  Ref. Range 07/19/2017 12:22 07/19/2017 17:31 07/19/2017 20:47 07/20/2017 07:33 07/20/2017 12:31  Glucose-Capillary Latest Ref Range: 65 - 99 mg/dL 409182 (H) 811144 (H) 914203 (H) 265 (H) 333 (H)   Diabetes history: Type 2 diabetes Outpatient Diabetes medications: Novolog 70/30-40 units in the AM and Novolog 70/30- 30 units in the PM Current orders for Inpatient glycemic control:  Novolog moderate tid with meals and HS, Levemir 26 units q HS  Inpatient Diabetes Program Recommendations:    Consider increasing Levemir to 30 units q HS, and add Novolog 6 units tid with meals (meal coverage).  Text page sent to MD.   Thanks, Beryl MeagerJenny Cay Kath, RN, BC-ADM Inpatient Diabetes Coordinator Pager [phone] (8a-5p)

## 2017-07-20 NOTE — Progress Notes (Signed)
PROGRESS NOTE    Rebecca [name]  MAU:[id number] DOB: [birth year] DOA: 07/18/2017 PCP: Inc, [facility]     Brief Narrative: Rebecca [name] is a 51 y.o. female with medical history significant for insulin-dependent diabetes mellitus, coronary artery disease, and hypertension, now presenting to the emergency department for evaluation of throbbing right great toe pain.  Patient reports that she has had an ulcer at the base of the right first toe for about a year now.  She reports that it had been draining recently and has become increasingly painful.  She suffers from diabetic neuropathy and often has associated pain in both feet, but now reports a severe throbbing pain that is new involving the right first toe.  No fevers or chills are reported.  She had been treated with antibiotics for this previously.  No chest pain or palpitations.  ED Course: Upon arrival to the ED, patient is found to be afebrile, saturating well on room air, and with vitals otherwise stable.  Radiographs of the right foot features are questionable cortical lucency and irregularity at the medial and plantar aspect of the first right phalanx, possibly reflecting a small foci of osteomyelitis.  Chemistry panel reveals a sodium of 133, BUN 39, glucose 422, and serum creatinine 1.35, similar to priors.  CBC is notable for a stable chronic normocytic anemia with hemoglobin of 11.8.  ESR is mildly elevated at 54 and CRP is negative.  Patient was treated with vancomycin and Zosyn in the emergency department, remained hemodynamically stable and in no apparent respiratory distress, and will be observed in the medical-surgical unit for ongoing evaluation and management of toe ulcer with concern for underlying osteomyelitis.   Assessment & Plan:   Principal Problem:   Toe ulcer, right (HCC) Active Problems:   DM (diabetes mellitus), type 2, uncontrolled ([place])   Essential hypertension   CAD- with distal LAD  disease, ? 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   Mild renal insufficiency   Cellulitis in diabetic foot (HCC)   Diabetic foot ulcer (HCC)   Osteomyelitis of right foot ([place])  1-Non Healing Diabetic Toe Ulcer, Osteomyelitis;  She report last time she took oral antibiotics was 6 month ago. Over last 2 months she started to have problem again with ulcer, it has open , and has been draining bloody secretion. She presented with worsening pain.  MRI consistent with osteomyelitis distal phalanges great toe.  Continue with IV antibiotics.  ABI. Normal/  Ortho consulted.  Follow Blood culture. Pending.  Plan for sx on Friday.   2-Insulin DM type 1; On levemir while inpatient. Increase levemir.  SSI.   3-Acute Renal Insufficiency;  Hold ARB.  Improved. Follow trend.  Repeat labs in am.   4-HTN; hold ARB due to AKI.  Started  Norvasc.     CAD  - No anginal complaints  - Continue Lipitor and ARB         DVT prophylaxis: Lovenox.  Code Status: full code.  Family Communication: care discussed with daughter  Disposition Plan: remain inpatient. [facility] at time of discharge   Consultants:   ortho   Procedures:   ABI   Antimicrobials: Ceftriaxone and flagyl./    Subjective: She relate pain has improved.  She is asking what is the risk for wound healing. I discussed with Dr Sharol Given, and is 10 %.    Objective: Vitals:   07/19/17 1756 07/19/17 2048 07/20/17 0522 07/20/17 0927  BP: 114/67 139/68 (!) 124/53 (!) 153/79  Pulse: 80  86 90 92  Resp: _0 Temp: 98.7 F (37.1 C) 98.5 F (36.9 C) 98.4 F (36.9 C) 98.2 F (36.8 C)  TempSrc: Oral Oral Oral Oral  SpO2: 95% 100% 100% 98%  Weight:  78.2 kg (172 lb 6.4 oz)    Height:        Intake/Output Summary (Last 24 hours) at 07/20/17 1350 Last data filed at 07/20/17 1856  Gross per 24 hour  Intake              980 ml  Output                0 ml  Net              980 ml   Filed Weights   07/18/17 2025 07/19/17 2048  Weight: 78  kg (172 lb) 78.2 kg (172 lb 6.4 oz)    Examination:  General exam: NAD Respiratory system: CTA Cardiovascular system: S 1, S 2 RRR Gastrointestinal system: BS present, soft, nt Central nervous system: non focal.  Extremities: Symmetric 5 x 5 power. Skin:right great toe with ulcer plantar aspect, no drainage notice. Swollen great toe.   Data Reviewed: I have personally reviewed following labs and imaging studies  CBC:  Recent Labs Lab 07/18/17 2036 07/19/17 0339  WBC 6.8 7.2  NEUTROABS 3.3 3.6  HGB 11.8* 12.7  HCT 35.5* 38.0  MCV 82.4 82.3  PLT 322 314   Basic Metabolic Panel:  Recent Labs Lab 07/18/17 2036 07/19/17 0339  NA 133* 136  K 4.6 3.9  CL 101 103  CO2 24 25  GLUCOSE 422* 220*  BUN 39* 33*  CREATININE 1.35* 1.09*  CALCIUM 8.9 9.1   GFR: Estimated Creatinine Clearance: 55.1 mL/min (A) (by C-G formula based on SCr of 1.09 mg/dL (H)). Liver Function Tests: No results for input(s): AST, ALT, ALKPHOS, BILITOT, PROT, ALBUMIN in the last 168 hours. No results for input(s): LIPASE, AMYLASE in the last 168 hours. No results for input(s): AMMONIA in the last 168 hours. Coagulation Profile: No results for input(s): INR, PROTIME in the last 168 hours. Cardiac Enzymes: No results for input(s): CKTOTAL, CKMB, CKMBINDEX, TROPONINI in the last 168 hours. BNP (last 3 results) No results for input(s): PROBNP in the last 8760 hours. HbA1C:  Recent Labs  07/19/17 0339  HGBA1C 8.3*   CBG:  Recent Labs Lab 07/19/17 1222 07/19/17 1731 07/19/17 2047 07/20/17 0733 07/20/17 1231  GLUCAP 182* 144* 203* 265* 333*   Lipid Profile: No results for input(s): CHOL, HDL, LDLCALC, TRIG, CHOLHDL, LDLDIRECT in the last 72 hours. Thyroid Function Tests: No results for input(s): TSH, T4TOTAL, FREET4, T3FREE, THYROIDAB in the last 72 hours. Anemia Panel: No results for input(s): VITAMINB12, FOLATE, FERRITIN, TIBC, IRON, RETICCTPCT in the last 72 hours. Sepsis Labs: No  results for input(s): PROCALCITON, LATICACIDVEN in the last 168 hours.  Recent Results (from the past 240 hour(s))  Blood Cultures x 2 sites     Status: None (Preliminary result)   Collection Time: 07/19/17  3:30 AM  Result Value Ref Range Status   Specimen Description BLOOD RIGHT HAND  Final   Special Requests   Final    BOTTLES DRAWN AEROBIC ONLY Blood Culture adequate volume   Culture PENDING  Incomplete   Report Status PENDING  Incomplete         Radiology Studies: Mr Toes Right Wo Contrast  Result Date: 07/19/2017 CLINICAL DATA:  Right great toe pain.  Diabetic  foot ulcer. EXAM: MRI OF THE RIGHT TOES WITHOUT CONTRAST TECHNIQUE: Multiplanar, multisequence MR imaging of the right forefoot was performed. No intravenous contrast was administered. COMPARISON:  Radiographs dated 07/18/2017 FINDINGS: Bones/Joint/Cartilage There is abnormal signal from the distal phalanx of the right great toe consistent with osteomyelitis. No discrete bone destruction or joint effusion. The other bones of the forefoot appear normal. Soft tissues Focal edema in the soft tissues of the medial plantar aspect of the great toe. IMPRESSION: Findings consistent with osteomyelitis of the distal phalanx of the right great toe. Adjacent cellulitis. Electronically Signed   By: Lorriane Shire M.D.   On: 07/19/2017 07:00   Dg Toe Great Right  Result Date: 07/18/2017 CLINICAL DATA:  Diabetic ulcer EXAM: RIGHT GREAT TOE COMPARISON:  06/15/2017 FINDINGS: No fracture or malalignment. No soft tissue emphysema. Minimal cortical irregularity at the tuft of the distal phalanx with some lucency on the medial side IMPRESSION: 1. No fracture 2. Questionable cortical lucency and irregularity medial and plantar surface of the first distal phalanx, cannot exclude small foci of osteomyelitis. Electronically Signed   By: Donavan Foil M.D.   On: 07/18/2017 23:38        Scheduled Meds: . atorvastatin  10 mg Oral q1800  .  enoxaparin (LOVENOX) injection  40 mg Subcutaneous Daily  . feeding supplement (PRO-STAT SUGAR FREE 64)  30 mL Oral BID  . gabapentin  600 mg Oral BID  . insulin aspart  0-15 Units Subcutaneous TID WC  . insulin aspart  0-5 Units Subcutaneous QHS  . insulin detemir  20 Units Subcutaneous QHS  . terbinafine   Topical BID   Continuous Infusions: . cefTRIAXone (ROCEPHIN)  IV Stopped (07/20/17 0841)   And  . metronidazole Stopped (07/20/17 0734)     LOS: 1 day    Time spent: 35 minutes.     Elmarie Shiley, MD [facility] Pager [phone]  If 7PM-7AM, please contact night-coverage [email or web address] Password [email or web address] 07/20/2017, 1:50 PM

## 2017-07-20 NOTE — Progress Notes (Signed)
Patient ID: Rebecca [name], female   DOB: 07/16/1966, 51 y.o.   MRN: [id number] Patient alert and oriented this morning without complaints.  Plan for amputation of the right great toe at the MTP joint on Friday morning. 

## 2017-07-21 ENCOUNTER — Encounter (HOSPITAL_COMMUNITY): Payer: Self-pay | Admitting: *Deleted

## 2017-07-21 ENCOUNTER — Encounter (HOSPITAL_COMMUNITY)
Admission: EM | Disposition: A | Discharge status: Home / Self Care | Payer: Self-pay | Source: Home / Self Care | Attending: Internal Medicine

## 2017-07-21 ENCOUNTER — Inpatient Hospital Stay (HOSPITAL_COMMUNITY): Payer: Self-pay | Admitting: Certified Registered Nurse Anesthetist

## 2017-07-21 DIAGNOSIS — M86171 Other acute osteomyelitis, right ankle and foot: Secondary | ICD-10-CM

## 2017-07-21 HISTORY — PX: AMPUTATION: SHX166

## 2017-07-21 LAB — BASIC METABOLIC PANEL
Anion gap: 8 (ref 5–15)
BUN: 26 mg/dL — AB (ref 6–20)
CALCIUM: 8.9 mg/dL (ref 8.9–10.3)
CO2: 23 mmol/L (ref 22–32)
CREATININE: 0.86 mg/dL (ref 0.44–1.00)
Chloride: 105 mmol/L (ref 101–111)
GFR calc non Af Amer: >60 mL/min (ref >60)
Glucose, Bld: 248 mg/dL — ABNORMAL HIGH (ref 65–99)
Potassium: 4.2 mmol/L (ref 3.5–5.1)
Sodium: 136 mmol/L (ref 135–145)

## 2017-07-21 LAB — CBC
HEMATOCRIT: 36.3 % (ref 36.0–46.0)
Hemoglobin: 11.9 g/dL — ABNORMAL LOW (ref 12.0–15.0)
MCH: 27.4 pg (ref 26.0–34.0)
MCHC: 32.8 g/dL (ref 30.0–36.0)
MCV: 83.4 fL (ref 78.0–100.0)
Platelets: 275 10*3/uL (ref 150–400)
RBC: 4.35 MIL/uL (ref 3.87–5.11)
RDW: 13.4 % (ref 11.5–15.5)
WBC: 7.9 10*3/uL (ref 4.0–10.5)

## 2017-07-21 LAB — I-STAT BETA HCG BLOOD, ED (NOT ORDERABLE): I-stat hCG, quantitative: 5.4 m[IU]/mL — ABNORMAL HIGH (ref <5)

## 2017-07-21 LAB — GLUCOSE, CAPILLARY
GLUCOSE-CAPILLARY: 277 mg/dL — AB (ref 65–99)
GLUCOSE-CAPILLARY: 252 mg/dL — AB (ref 65–99)
GLUCOSE-CAPILLARY: 177 mg/dL — AB (ref 65–99)
Glucose-Capillary: 248 mg/dL — ABNORMAL HIGH (ref 65–99)
Glucose-Capillary: 165 mg/dL — ABNORMAL HIGH (ref 65–99)

## 2017-07-21 LAB — MRSA PCR SCREENING: MRSA BY PCR: NEGATIVE

## 2017-07-21 SURGERY — AMPUTATION DIGIT
Anesthesia: General | Site: Toe | Laterality: Right

## 2017-07-21 MED ORDER — BISACODYL 10 MG RE SUPP: 10.0000 mg | Freq: Every day | RECTAL | Status: DC | PRN

## 2017-07-21 MED ORDER — POLYETHYLENE GLYCOL 3350 17 G PO PACK: 17.0000 g | PACK | Freq: Every day | ORAL | Status: DC | PRN

## 2017-07-21 MED ORDER — PROPOFOL 10 MG/ML IV BOLUS
INTRAVENOUS | Status: DC | PRN
  Administered 2017-07-21: 150 mg via INTRAVENOUS

## 2017-07-21 MED ORDER — ONDANSETRON HCL 4 MG/2ML IJ SOLN
4.0000 mg | Freq: Four times a day (QID) | INTRAMUSCULAR | Status: DC | PRN
  Administered 2017-07-21: 4 mg via INTRAVENOUS
  Filled 2017-07-21 (×2): qty 2

## 2017-07-21 MED ORDER — ACETAMINOPHEN 650 MG RE SUPP: 650.0000 mg | RECTAL | Status: DC | PRN

## 2017-07-21 MED ORDER — FENTANYL CITRATE (PF) 250 MCG/5ML IJ SOLN
INTRAMUSCULAR | Status: AC
  Filled 2017-07-21: qty 5

## 2017-07-21 MED ORDER — CHLORHEXIDINE GLUCONATE 4 % EX LIQD
60.0000 mL | Freq: Once | CUTANEOUS | Status: DC
  Administered 2017-07-21: 4 via TOPICAL
  Filled 2017-07-21: qty 60

## 2017-07-21 MED ORDER — LACTATED RINGERS IV SOLN
INTRAVENOUS | Status: DC
  Administered 2017-07-21 (×2): via INTRAVENOUS

## 2017-07-21 MED ORDER — MAGNESIUM CITRATE PO SOLN: 1.0000 | Freq: Once | ORAL | Status: DC | PRN

## 2017-07-21 MED ORDER — SODIUM CHLORIDE 0.9 % IV SOLN: INTRAVENOUS | Status: DC

## 2017-07-21 MED ORDER — MEPERIDINE HCL 25 MG/ML IJ SOLN: 6.2500 mg | INTRAMUSCULAR | Status: DC | PRN

## 2017-07-21 MED ORDER — LIDOCAINE 2% (20 MG/ML) 5 ML SYRINGE
INTRAMUSCULAR | Status: DC | PRN
  Administered 2017-07-21: 80 mg via INTRAVENOUS

## 2017-07-21 MED ORDER — CEFAZOLIN SODIUM-DEXTROSE 2-4 GM/100ML-% IV SOLN
2.0000 g | INTRAVENOUS | Status: AC
  Administered 2017-07-21: 2 g via INTRAVENOUS
  Filled 2017-07-21: qty 100

## 2017-07-21 MED ORDER — PROPOFOL 10 MG/ML IV BOLUS
INTRAVENOUS | Status: AC
  Filled 2017-07-21: qty 20

## 2017-07-21 MED ORDER — HYDROMORPHONE HCL 1 MG/ML IJ SOLN
INTRAMUSCULAR | Status: AC
  Filled 2017-07-21: qty 1

## 2017-07-21 MED ORDER — DOCUSATE SODIUM 100 MG PO CAPS
100.0000 mg | ORAL_CAPSULE | Freq: Two times a day (BID) | ORAL | Status: DC
  Administered 2017-07-21 – 2017-07-23 (×4): 100 mg via ORAL
  Filled 2017-07-21 (×5): qty 1

## 2017-07-21 MED ORDER — PHENYLEPHRINE 40 MCG/ML (10ML) SYRINGE FOR IV PUSH (FOR BLOOD PRESSURE SUPPORT)
PREFILLED_SYRINGE | INTRAVENOUS | Status: AC
  Filled 2017-07-21: qty 10

## 2017-07-21 MED ORDER — ONDANSETRON HCL 4 MG PO TABS: 4.0000 mg | ORAL_TABLET | Freq: Four times a day (QID) | ORAL | Status: DC | PRN

## 2017-07-21 MED ORDER — METOCLOPRAMIDE HCL 5 MG PO TABS: 5.0000 mg | ORAL_TABLET | Freq: Three times a day (TID) | ORAL | Status: DC | PRN

## 2017-07-21 MED ORDER — MIDAZOLAM HCL 2 MG/2ML IJ SOLN
INTRAMUSCULAR | Status: AC
  Filled 2017-07-21: qty 2

## 2017-07-21 MED ORDER — METHOCARBAMOL 1000 MG/10ML IJ SOLN
500.0000 mg | Freq: Four times a day (QID) | INTRAVENOUS | Status: DC | PRN
  Filled 2017-07-21: qty 5

## 2017-07-21 MED ORDER — 0.9 % SODIUM CHLORIDE (POUR BTL) OPTIME
TOPICAL | Status: DC | PRN
  Administered 2017-07-21: 1000 mL

## 2017-07-21 MED ORDER — ONDANSETRON HCL 4 MG/2ML IJ SOLN: 4.0000 mg | Freq: Once | INTRAMUSCULAR | Status: DC | PRN

## 2017-07-21 MED ORDER — CEFAZOLIN SODIUM-DEXTROSE 1-4 GM/50ML-% IV SOLN
1.0000 g | Freq: Four times a day (QID) | INTRAVENOUS | Status: DC
  Administered 2017-07-21: 1 g via INTRAVENOUS
  Filled 2017-07-21 (×2): qty 50

## 2017-07-21 MED ORDER — METOCLOPRAMIDE HCL 5 MG/ML IJ SOLN
5.0000 mg | Freq: Three times a day (TID) | INTRAMUSCULAR | Status: DC | PRN
  Administered 2017-07-21: 10 mg via INTRAVENOUS
  Filled 2017-07-21: qty 2

## 2017-07-21 MED ORDER — PHENYLEPHRINE HCL 10 MG/ML IJ SOLN
INTRAMUSCULAR | Status: DC | PRN
  Administered 2017-07-21: 40 ug via INTRAVENOUS

## 2017-07-21 MED ORDER — HYDROMORPHONE HCL 1 MG/ML IJ SOLN
0.2500 mg | INTRAMUSCULAR | Status: DC | PRN
  Administered 2017-07-21 (×2): 0.5 mg via INTRAVENOUS

## 2017-07-21 MED ORDER — OXYCODONE HCL 5 MG PO TABS: 10.0000 mg | ORAL_TABLET | ORAL | Status: DC | PRN

## 2017-07-21 MED ORDER — ACETAMINOPHEN 325 MG PO TABS: 650.0000 mg | ORAL_TABLET | ORAL | Status: DC | PRN

## 2017-07-21 MED ORDER — METHOCARBAMOL 500 MG PO TABS: 500.0000 mg | ORAL_TABLET | Freq: Four times a day (QID) | ORAL | Status: DC | PRN

## 2017-07-21 MED ORDER — HYDROCODONE-ACETAMINOPHEN 5-325 MG PO TABS: 1.0000 | ORAL_TABLET | ORAL | Status: DC | PRN

## 2017-07-21 MED ORDER — FENTANYL CITRATE (PF) 100 MCG/2ML IJ SOLN
INTRAMUSCULAR | Status: DC | PRN
  Administered 2017-07-21: 100 ug via INTRAVENOUS

## 2017-07-21 SURGICAL SUPPLY — 29 items
BLADE SURG 21 STRL SS (BLADE) ×3 IMPLANT
BNDG CMPR 9X4 STRL LF SNTH (GAUZE/BANDAGES/DRESSINGS) ×1
BNDG COHESIVE 4X5 TAN STRL (GAUZE/BANDAGES/DRESSINGS) ×3 IMPLANT
BNDG ESMARK 4X9 LF (GAUZE/BANDAGES/DRESSINGS) ×2 IMPLANT
BNDG GAUZE ELAST 4 BULKY (GAUZE/BANDAGES/DRESSINGS) ×3 IMPLANT
COVER SURGICAL LIGHT HANDLE (MISCELLANEOUS) ×6 IMPLANT
DRAPE U-SHAPE 47X51 STRL (DRAPES) ×3 IMPLANT
DRSG ADAPTIC 3X8 NADH LF (GAUZE/BANDAGES/DRESSINGS) ×2 IMPLANT
DRSG PAD ABDOMINAL 8X10 ST (GAUZE/BANDAGES/DRESSINGS) ×3 IMPLANT
DURAPREP 26ML APPLICATOR (WOUND CARE) ×3 IMPLANT
ELECT REM PT RETURN 9FT ADLT (ELECTROSURGICAL) ×3
ELECTRODE REM PT RTRN 9FT ADLT (ELECTROSURGICAL) ×1 IMPLANT
GAUZE SPONGE 4X4 12PLY STRL (GAUZE/BANDAGES/DRESSINGS) ×2 IMPLANT
GLOVE BIOGEL PI IND STRL 9 (GLOVE) ×1 IMPLANT
GLOVE BIOGEL PI INDICATOR 9 (GLOVE) ×2
GLOVE SURG ORTHO 9.0 STRL STRW (GLOVE) ×3 IMPLANT
GOWN STRL REUS W/ TWL XL LVL3 (GOWN DISPOSABLE) ×2 IMPLANT
GOWN STRL REUS W/TWL XL LVL3 (GOWN DISPOSABLE) ×6
KIT BASIN OR (CUSTOM PROCEDURE TRAY) ×3 IMPLANT
KIT ROOM TURNOVER OR (KITS) ×3 IMPLANT
MANIFOLD NEPTUNE II (INSTRUMENTS) ×3 IMPLANT
NEEDLE 22X1 1/2 (OR ONLY) (NEEDLE) IMPLANT
NS IRRIG 1000ML POUR BTL (IV SOLUTION) ×3 IMPLANT
PACK ORTHO EXTREMITY (CUSTOM PROCEDURE TRAY) ×3 IMPLANT
PAD ABD 8X10 STRL (GAUZE/BANDAGES/DRESSINGS) ×2 IMPLANT
PAD ARMBOARD 7.5X6 YLW CONV (MISCELLANEOUS) ×6 IMPLANT
SUT ETHILON 2 0 PSLX (SUTURE) ×3 IMPLANT
SYR CONTROL 10ML LL (SYRINGE) IMPLANT
TOWEL OR 17X26 10 PK STRL BLUE (TOWEL DISPOSABLE) ×3 IMPLANT

## 2017-07-21 NOTE — Progress Notes (Signed)
Inpatient Diabetes Program Recommendations  AACE/ADA: New Consensus Statement on Inpatient Glycemic Control (2015)  Target Ranges:  Prepandial:   less than 140 mg/dL      Peak postprandial:   less than 180 mg/dL (1-2 hours)      Critically ill patients:  140 - 180 mg/dL   Lab Results  Component Value Date   GLUCAP 252 (H) 07/21/2017   HGBA1C 8.3 (H) 07/19/2017    Review of Glycemic ControlResults for [name], Rebecca I (MRN [id number]) as of 07/21/2017 12:05  Ref. Range 07/20/2017 07:33 07/20/2017 12:31 07/20/2017 17:04 07/20/2017 20:58 07/21/2017 08:07 07/21/2017 09:52 07/21/2017 11:37  Glucose-Capillary Latest Ref Range: 65 - 99 mg/dL 629265 (H) 528333 (H) 95 413157 (H) 248 (H) 277 (H) 252 (H)   Diabetes history: Type 2 diabetes Outpatient Diabetes medications: Novolog 70/30-40 units in the AM and Novolog 70/30- 30 units in the PM Current orders for Inpatient glycemic control:  Novolog moderate tid with meals and HS, Levemir 26 units q HS Inpatient Diabetes Program Recommendations:   Consider increasing Levemir to 32 units q HS.    Thanks, Beryl MeagerJenny Celine Dishman, RN, BC-ADM Inpatient Diabetes Coordinator Pager [phone] (8a-5p)

## 2017-07-21 NOTE — Progress Notes (Signed)
I-stat beta hcg 5.4. Anesthesiologist notified. No new orders received.

## 2017-07-21 NOTE — Progress Notes (Signed)
PROGRESS NOTE  Rebecca [name]:[id number] DOB: 11/30/1965 DOA: 07/18/2017 PCP: [facility], [facility]   LOS: 2 days   Brief Narrative / Interim history: Rebecca I Floresis a 51 y.o.femalewith medical history significant for insulin-dependent diabetes mellitus, coronary artery disease, and hypertension, now presenting to the emergency department for evaluation of throbbing right great toe pain. She was found to have toe osteomyelitis and is s/p amputation 10/31  Assessment & Plan: Principal Problem:   Toe ulcer, right (HCC) Active Problems:   DM (diabetes mellitus), type 2, uncontrolled (HCC)   Essential hypertension   CAD- with distal LAD disease, ? 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   Mild renal insufficiency   Cellulitis in diabetic foot (HCC)   Diabetic foot ulcer (HCC)   Osteomyelitis of right foot (HCC)   Acute osteomyelitis of toe of right foot (HCC)   Non Healing Diabetic Toe Ulcer, Osteomyelitis -She report last time she took oral antibiotics was 6 month ago. Over last 2 months she started to have problem again with ulcer, it has open , and has been draining bloody secretion. She presented with worsening pain.  -MRI consistent with osteomyelitis distal phalanges great toe.  -continue IV antibiotics for today, s/p amputation this morning  -likely convert to po Abx on d/c for 2-3 more days  Insulin DM type 1 -On levemir while inpatient. Increase levemir.  -SSI -non compliant with her diet  Acute Renal Insufficiency -Hold ARB, Cr now normalized  HTN  -hold ARB due to AKI.  -Started  Norvasc, BP 140s this morning   CAD  -No anginal complaints  -Continue Lipitor   DVT prophylaxis: Lovenox Code Status: Full code Family Communication: daughters bedside Disposition Plan: home 1-2 days  Consultants:   Orthopedic surgery   Procedures:   Right great toe amputation   Antimicrobials:  Vancomycin 10/29 >>  Ceftriaxone 10/29 >>  Metronidazole  10/29 >>  Subjective: -seen post op, no pain yet  Objective: Vitals:   07/21/17 1040 07/21/17 1045 07/21/17 1055 07/21/17 1130  BP: (!) 162/76  (!) 153/77   Pulse: 83 83 85   Resp: 16 14 13    Temp:    97.9 F (36.6 C)  TempSrc:      SpO2: 98% 97% 98%   Weight:      Height:        Intake/Output Summary (Last 24 hours) at 07/21/17 1305 Last data filed at 07/21/17 1140  Gross per 24 hour  Intake              900 ml  Output               25 ml  Net              875 ml   Filed Weights   07/19/17 2048 07/20/17 2102 07/21/17 0835  Weight: 78.2 kg (172 lb 6.4 oz) 78.2 kg (172 lb 4.8 oz) 78 kg (172 lb)    Examination:  Constitutional: NAD Eyes: lids and conjunctivae normal Respiratory: clear to auscultation bilaterally, no wheezing, no crackles.  Cardiovascular: Regular rate and rhythm, no murmurs / rubs / gallops.  Abdomen: no tenderness. Bowel sounds positive.  Musculoskeletal: no clubbing / cyanosis. Right foot wrapped Skin: no rashes, lesions, ulcers. No induration Neurologic: non focal    Data Reviewed: I have independently reviewed following labs and imaging studies   CBC:  Recent Labs Lab 07/18/17 2036 07/19/17 0339 07/21/17 0226  WBC 6.8 7.2 7.9  NEUTROABS 3.3 3.6  --  HGB 11.8* 12.7 11.9*  HCT 35.5* 38.0 36.3  MCV 82.4 82.3 83.4  PLT 322 304 275   Basic Metabolic Panel:  Recent Labs Lab 07/18/17 2036 07/19/17 0339 07/21/17 0226  NA 133* 136 136  K 4.6 3.9 4.2  CL 101 103 105  CO2 24 25 23   GLUCOSE 422* 220* 248*  BUN 39* 33* 26*  CREATININE 1.35* 1.09* 0.86  CALCIUM 8.9 9.1 8.9   GFR: Estimated Creatinine Clearance: 69.8 mL/min (by C-G formula based on SCr of 0.86 mg/dL). Liver Function Tests: No results for input(s): AST, ALT, ALKPHOS, BILITOT, PROT, ALBUMIN in the last 168 hours. No results for input(s): LIPASE, AMYLASE in the last 168 hours. No results for input(s): AMMONIA in the last 168 hours. Coagulation Profile: No results for  input(s): INR, PROTIME in the last 168 hours. Cardiac Enzymes: No results for input(s): CKTOTAL, CKMB, CKMBINDEX, TROPONINI in the last 168 hours. BNP (last 3 results) No results for input(s): PROBNP in the last 8760 hours. HbA1C:  Recent Labs  07/19/17 0339  HGBA1C 8.3*   CBG:  Recent Labs Lab 07/20/17 1704 07/20/17 2058 07/21/17 0807 07/21/17 0952 07/21/17 1137  GLUCAP 95 157* 248* 277* 252*   Lipid Profile: No results for input(s): CHOL, HDL, LDLCALC, TRIG, CHOLHDL, LDLDIRECT in the last 72 hours. Thyroid Function Tests: No results for input(s): TSH, T4TOTAL, FREET4, T3FREE, THYROIDAB in the last 72 hours. Anemia Panel: No results for input(s): VITAMINB12, FOLATE, FERRITIN, TIBC, IRON, RETICCTPCT in the last 72 hours. Urine analysis:    Component Value Date/Time   COLORURINE YELLOW 03/05/2016 0457   APPEARANCEUR CLEAR 03/05/2016 0457   LABSPEC 1.017 03/05/2016 0457   PHURINE 5.5 03/05/2016 0457   GLUCOSEU 500 (A) 03/05/2016 0457   HGBUR NEGATIVE 03/05/2016 0457   BILIRUBINUR NEGATIVE 03/05/2016 0457   KETONESUR NEGATIVE 03/05/2016 0457   PROTEINUR NEGATIVE 03/05/2016 0457   UROBILINOGEN 0.2 05/13/2015 0411   NITRITE NEGATIVE 03/05/2016 0457   LEUKOCYTESUR TRACE (A) 03/05/2016 0457   Sepsis Labs: Invalid input(s): PROCALCITONIN, LACTICIDVEN  Recent Results (from the past 240 hour(s))  Blood Cultures x 2 sites     Status: None (Preliminary result)   Collection Time: 07/19/17  3:20 AM  Result Value Ref Range Status   Specimen Description BLOOD LEFT ARM  Final   Special Requests   Final    BOTTLES DRAWN AEROBIC AND ANAEROBIC Blood Culture results may not be optimal due to an excessive volume of blood received in culture bottles   Culture NO GROWTH 2 DAYS  Final   Report Status PENDING  Incomplete  Blood Cultures x 2 sites     Status: None (Preliminary result)   Collection Time: 07/19/17  3:30 AM  Result Value Ref Range Status   Specimen Description BLOOD  RIGHT HAND  Final   Special Requests   Final    BOTTLES DRAWN AEROBIC ONLY Blood Culture adequate volume   Culture NO GROWTH 2 DAYS  Final   Report Status PENDING  Incomplete  MRSA PCR Screening     Status: None   Collection Time: 07/21/17  8:00 AM  Result Value Ref Range Status   MRSA by PCR NEGATIVE NEGATIVE Final    Comment:        The GeneXpert MRSA Assay (FDA approved for NASAL specimens only), is one component of a comprehensive MRSA colonization surveillance program. It is not intended to diagnose MRSA infection nor to guide or monitor treatment for MRSA infections.  Radiology Studies: No results found.  Scheduled Meds: . amLODipine  5 mg Oral Daily  . atorvastatin  10 mg Oral q1800  . docusate sodium  100 mg Oral BID  . enoxaparin (LOVENOX) injection  40 mg Subcutaneous Daily  . feeding supplement (PRO-STAT SUGAR FREE 64)  30 mL Oral BID  . gabapentin  600 mg Oral BID  . HYDROmorphone      . insulin aspart  0-15 Units Subcutaneous TID WC  . insulin aspart  0-5 Units Subcutaneous QHS  . insulin aspart  4 Units Subcutaneous TID WC  . insulin detemir  26 Units Subcutaneous QHS  . terbinafine   Topical BID   Continuous Infusions: . sodium chloride    .  ceFAZolin (ANCEF) IV    . cefTRIAXone (ROCEPHIN)  IV 2 g (07/21/17 1303)   And  . metronidazole 500 mg (07/21/17 0648)  . lactated ringers 10 mL/hr at 07/21/17 0837  . methocarbamol (ROBAXIN)  IV      Pamella Pert, MD, PhD [facility] Pager [phone]  If 7PM-7AM, please contact night-coverage [email or web address] Password [email or web address] 07/21/2017, 1:05 PM

## 2017-07-21 NOTE — Anesthesia Postprocedure Evaluation (Signed)
Anesthesia Post Note  Patient: Lenor Derricksidora I Flores  Procedure(s) Performed: RIGHT GREAT TOE AMPUTATION (Right Toe)     Patient location during evaluation: PACU Anesthesia Type: General Level of consciousness: awake and alert Pain management: pain level controlled Vital Signs Assessment: post-procedure vital signs reviewed and stable Respiratory status: spontaneous breathing, nonlabored ventilation, respiratory function stable and patient connected to nasal cannula oxygen Cardiovascular status: blood pressure returned to baseline and stable Postop Assessment: no apparent nausea or vomiting Anesthetic complications: no    Last Vitals:  Vitals:   07/21/17 1055 07/21/17 1130  BP: (!) 153/77   Pulse: 85   Resp: 13   Temp:  36.6 C  SpO2: 98%     Last Pain:  Vitals:   07/21/17 1030  TempSrc:   PainSc: 0-No pain        RLE Motor Response: Purposeful movement (07/21/17 1130) RLE Sensation: Full sensation (07/21/17 1130)      Delano Scardino DAVID

## 2017-07-21 NOTE — H&P (View-Only) (Signed)
Patient ID: Rebecca [name], female   DOB: 07/25/1966, 51 y.o.   MRN: [id number] Patient alert and oriented this morning without complaints.  Plan for amputation of the right great toe at the MTP joint on Friday morning.

## 2017-07-21 NOTE — Anesthesia Procedure Notes (Signed)
Procedure Name: LMA Insertion Date/Time: 07/21/2017 9:27 AM Performed by: Dorie RankQUINN, Aleda Madl M Pre-anesthesia Checklist: Patient identified, Emergency Drugs available, Suction available, Patient being monitored and Timeout performed Patient Re-evaluated:Patient Re-evaluated prior to induction Oxygen Delivery Method: Circle system utilized Preoxygenation: Pre-oxygenation with 100% oxygen Induction Type: IV induction Ventilation: Mask ventilation without difficulty LMA: LMA inserted LMA Size: 4.0 Number of attempts: 1 Placement Confirmation: positive ETCO2 and breath sounds checked- equal and bilateral Tube secured with: Tape Dental Injury: Teeth and Oropharynx as per pre-operative assessment

## 2017-07-21 NOTE — Op Note (Signed)
07/18/2017 - 07/21/2017  9:39 AM  PATIENT:  Rebecca [name]    PRE-OPERATIVE DIAGNOSIS:  Osteomyelitis Right Great Toe  POST-OPERATIVE DIAGNOSIS:  Same  PROCEDURE:  RIGHT GREAT TOE AMPUTATION  SURGEON:  Nadara MustardMarcus V Rosalynd Mcwright, MD  PHYSICIAN ASSISTANT:None ANESTHESIA:   General  PREOPERATIVE INDICATIONS:  Rebecca Derricksidora I [name] is a  51 y.o. female with a diagnosis of Osteomyelitis Right Great Toe who failed conservative measures and elected for surgical management.    The risks benefits and alternatives were discussed with the patient preoperatively including but not limited to the risks of infection, bleeding, nerve injury, cardiopulmonary complications, the need for revision surgery, among others, and the patient was willing to proceed.  OPERATIVE IMPLANTS: None  OPERATIVE FINDINGS: Good petechial bleeding no abscess  OPERATIVE PROCEDURE: Patient was brought to the operating room and underwent a general anesthetic.  After adequate levels of anesthesia were obtained patient's right lower extremity was prepped using DuraPrep draped under sterile field a timeout was called.  A fishmouth incision was made just distal to the MTP joint.  The toe was amputated through the MTP joint.  Dr. cautery was used for hemostasis.  There is no abscess no necrotic tissue.  Wound was irrigated with normal saline.  The incision was closed using 2-0 nylon.  A sterile compressive dressing was applied patient was extubated and taken the PACU in stable condition.

## 2017-07-21 NOTE — Transfer of Care (Signed)
Immediate Anesthesia Transfer of Care Note  Patient: Lenor Derricksidora I Flores  Procedure(s) Performed: RIGHT GREAT TOE AMPUTATION (Right Toe)  Patient Location: PACU  Anesthesia Type:General  Level of Consciousness: awake, alert  and oriented  Airway & Oxygen Therapy: Patient Spontanous Breathing  Post-op Assessment: Post -op Vital signs reviewed and stable  Post vital signs: stable  Last Vitals:  Vitals:   07/21/17 0527 07/21/17 0940  BP: (!) 145/69   Pulse: 80   Resp: 19   Temp: 36.6 C 36.5 C  SpO2: 98%     Last Pain:  Vitals:   07/21/17 0940  TempSrc:   PainSc: 0-No pain      Patients Stated Pain Goal: 0 (07/20/17 2112)  Complications: No apparent anesthesia complications

## 2017-07-21 NOTE — Interval H&P Note (Signed)
History and Physical Interval Note:  07/21/2017 6:41 AM  [name]  has presented today for surgery, with the diagnosis of Osteomyelitis Right Great Toe  The various methods of treatment have been discussed with the patient and family. After consideration of risks, benefits and other options for treatment, the patient has consented to  Procedure(s): RIGHT GREAT TOE AMPUTATION (Right) as a surgical intervention .  The patient's history has been reviewed, patient examined, no change in status, stable for surgery.  I have reviewed the patient's chart and labs.  Questions were answered to the patient's satisfaction.     Nadara MustardMarcus V Duda

## 2017-07-21 NOTE — Progress Notes (Signed)
Orthopedic Tech Progress Note Patient Details:  Rebecca [name] 07/11/1966 [id number]  Ortho Devices Type of Ortho Device: Postop shoe/boot Ortho Device/Splint Location: rle Ortho Device/Splint Interventions: Application   [name] 07/21/2017, 2:25 PM

## 2017-07-21 NOTE — Anesthesia Preprocedure Evaluation (Addendum)
Anesthesia Evaluation  Patient identified by MRN, date of birth, ID band Patient awake    Reviewed: Allergy & Precautions, NPO status , Patient's Chart, lab work & pertinent test results  Airway Mallampati: I  TM Distance: >3 FB Neck ROM: Full    Dental  (+) Teeth Intact, Dental Advisory Given   Pulmonary    Pulmonary exam normal breath sounds clear to auscultation       Cardiovascular Exercise Tolerance: Good hypertension, + CAD and + Past MI  Normal cardiovascular exam     Neuro/Psych    GI/Hepatic   Endo/Other  diabetes  Renal/GU      Musculoskeletal   Abdominal (+)  Abdomen: soft. Bowel sounds: normal.  Peds  Hematology   Anesthesia Other Findings   Reproductive/Obstetrics                            Anesthesia Physical Anesthesia Plan  ASA: III  Anesthesia Plan: General   Post-op Pain Management:    Induction: Intravenous  PONV Risk Score and Plan: 3 and Ondansetron, Dexamethasone, Midazolam and Treatment may vary due to age or medical condition  Airway Management Planned: LMA  Additional Equipment:   Intra-op Plan:   Post-operative Plan: Extubation in OR  Informed Consent: I have reviewed the patients History and Physical, chart, labs and discussed the procedure including the risks, benefits and alternatives for the proposed anesthesia with the patient or authorized representative who has indicated his/her understanding and acceptance.     Plan Discussed with: CRNA and Surgeon  Anesthesia Plan Comments:         Anesthesia Quick Evaluation

## 2017-07-22 ENCOUNTER — Encounter (HOSPITAL_COMMUNITY): Payer: Self-pay | Admitting: Orthopedic Surgery

## 2017-07-22 LAB — GLUCOSE, CAPILLARY
GLUCOSE-CAPILLARY: 110 mg/dL — AB (ref 65–99)
Glucose-Capillary: 248 mg/dL — ABNORMAL HIGH (ref 65–99)
Glucose-Capillary: 252 mg/dL — ABNORMAL HIGH (ref 65–99)
Glucose-Capillary: 93 mg/dL (ref 65–99)

## 2017-07-22 MED ORDER — AMOXICILLIN-POT CLAVULANATE 875-125 MG PO TABS
1.0000 | ORAL_TABLET | Freq: Two times a day (BID) | ORAL | Status: DC
  Administered 2017-07-22 – 2017-07-23 (×3): 1 via ORAL
  Filled 2017-07-22 (×3): qty 1

## 2017-07-22 MED ORDER — INSULIN DETEMIR 100 UNIT/ML ~~LOC~~ SOLN
30.0000 [IU] | Freq: Every day | SUBCUTANEOUS | Status: DC
  Administered 2017-07-22: 30 [IU] via SUBCUTANEOUS
  Filled 2017-07-22: qty 0.3

## 2017-07-22 MED ORDER — PNEUMOCOCCAL VAC POLYVALENT 25 MCG/0.5ML IJ INJ
0.5000 mL | INJECTION | Freq: Once | INTRAMUSCULAR | Status: AC
  Administered 2017-07-22: 0.5 mL via INTRAMUSCULAR
  Filled 2017-07-22: qty 0.5

## 2017-07-22 NOTE — Progress Notes (Addendum)
Results for Rebecca [name], Rebecca I (MRN [id number]) as of 07/22/2017 13:09  Ref. Range 07/21/2017 11:37 07/21/2017 16:59 07/21/2017 21:25 07/22/2017 08:02 07/22/2017 11:51  Glucose-Capillary Latest Ref Range: 65 - 99 mg/dL 409252 (H) 811165 (H) 914177 (H) 248 (H) 252 (H)  Noted that blood sugars have been greater than 180 mg/dl.  Recommend increasing Levemir to 30 units daily if blood sugars continue to be elevated. Will continue to monitor blood sugars while in the hospital.  Smith MinceKendra Estell Puccini RN BSN CDE Diabetes Coordinator Pager: [phone]  8am-5pm

## 2017-07-22 NOTE — Evaluation (Signed)
Physical Therapy Evaluation Patient Details Name: Rebecca [name] MRN: [id number] DOB: 10/18/1965 Today's Date: 07/22/2017   History of Present Illness  Pt is a 51 y/o spanish speaking female who presents s/p R great toe amputation on 07/21/17. Pt is currently TDWB on the R in post-op shoe.   Clinical Impression  Pt admitted with above diagnosis. Pt currently with functional limitations due to the deficits listed below (see PT Problem List). At the time of PT eval pt was able to perform transfers and ambulation with gross min guard assist for balance support and safety with RW use. Pt appeared to maintain weight bearing precautions well, and feel pt will progress well with mobility once IV is out and pt is able to bear more weight through her UE's. Pt will need to be able to negotiate a full flight of stairs before she is safe to discharge home. Will attempt tomorrow. Acutely, pt will benefit from skilled PT to increase their independence and safety with mobility to allow discharge to the venue listed below.       Follow Up Recommendations Home Health PT;Supervision for mobility/OOB    Equipment Recommendations  Rolling walker with 5" wheels    Recommendations for Other Services       Precautions / Restrictions Precautions Precautions: Fall Restrictions Weight Bearing Restrictions: Yes RLE Weight Bearing: Touchdown weight bearing      Mobility  Bed Mobility Overal bed mobility: Modified Independent Bed Mobility: Supine to Sit           General bed mobility comments: Increased time but no assist required to transition to EOB. HOB flat and rails lowered to simulate home environment.   Transfers Overall transfer level: Needs assistance Equipment used: Rolling walker (2 wheeled) Transfers: Sit to/from [employer]GI CorporationStand;Stand Pivot Transfers Sit to Stand: Min guard Stand pivot transfers: Min guard       General transfer comment: Hands-on assist required throughout transfer to standing  and to recliner chair. Increased time required and VC's for proper sequencing and safety with the RW. Pt appeared to maintain weight bearing precautions well. Pt reports increased pain at IV site in hand with weight bearing through RW, and was somewhat limited because of that.   Ambulation/Gait                Stairs            Wheelchair Mobility    Modified Rankin (Stroke Patients Only)       Balance Overall balance assessment: Needs assistance Sitting-balance support: Feet supported;No upper extremity supported Sitting balance-Leahy Scale: Good     Standing balance support: Bilateral upper extremity supported;During functional activity Standing balance-Leahy Scale: Poor Standing balance comment: Reliant on RW for support at this time.                              Pertinent Vitals/Pain Pain Assessment: No/denies pain (At rest)    Home Living Family/patient expects to be discharged to:: Private residence Living Arrangements: Spouse/significant other;Children Available Help at Discharge: Family;Available 24 hours/day Type of Home: Apartment Home Access: Level entry     Home Layout: Two level;Bed/bath upstairs Home Equipment: None      Prior Function Level of Independence: Independent         Comments: Does not work, does not drive     Hand Dominance   Dominant Hand: Right    Extremity/Trunk Assessment   Upper Extremity Assessment Upper Extremity  Assessment: Overall WFL for tasks assessed    Lower Extremity Assessment Lower Extremity Assessment: RLE deficits/detail RLE Deficits / Details: Decreased strength and acute pain consistent with procedure mentioned above.  RLE: Unable to fully assess due to pain;Unable to fully assess due to immobilization    Cervical / Trunk Assessment Cervical / Trunk Assessment: Normal  Communication   Communication: Prefers language other than English (Spanish)  Cognition Arousal/Alertness:  Awake/alert Behavior During Therapy: WFL for tasks assessed/performed Overall Cognitive Status: Within Functional Limits for tasks assessed                                 General Comments: Cognition normal per family      General Comments      Exercises     Assessment/Plan    PT Assessment Patient needs continued PT services  PT Problem List Decreased strength;Decreased range of motion;Decreased activity tolerance;Decreased balance;Decreased mobility;Decreased knowledge of use of DME;Decreased safety awareness;Decreased knowledge of precautions;Pain       PT Treatment Interventions DME instruction;Gait training;Stair training;Functional mobility training;Therapeutic activities;Therapeutic exercise;Neuromuscular re-education;Patient/family education    PT Goals (Current goals can be found in the Care Plan section)  Acute Rehab PT Goals Patient Stated Goal: Independence PT Goal Formulation: With patient/family Time For Goal Achievement: 07/29/17 Potential to Achieve Goals: Good    Frequency Min 3X/week   Barriers to discharge        Co-evaluation               AM-PAC PT "6 Clicks" Daily Activity  Outcome Measure Difficulty turning over in bed (including adjusting bedclothes, sheets and blankets)?: None Difficulty moving from lying on back to sitting on the side of the bed? : None Difficulty sitting down on and standing up from a chair with arms (e.g., wheelchair, bedside commode, etc,.)?: A Little Help needed moving to and from a bed to chair (including a wheelchair)?: A Little Help needed walking in hospital room?: A Little Help needed climbing 3-5 steps with a railing? : A Lot 6 Click Score: 19    End of Session Equipment Utilized During Treatment: Gait belt Activity Tolerance: Patient tolerated treatment well Patient left: in chair;with call bell/phone within reach;with chair alarm set;with family/visitor present Nurse Communication: Mobility  status PT Visit Diagnosis: Unsteadiness on feet (R26.81);Pain;Difficulty in walking, not elsewhere classified (R26.2) Pain - Right/Left: Right Pain - part of body: Ankle and joints of foot    Time: 5621-3086 PT Time Calculation (min) (ACUTE ONLY): 21 min   Charges:   PT Evaluation $PT Eval Moderate Complexity: 1 Mod     PT G Codes:        Conni Slipper, PT, DPT Acute Rehabilitation Services Pager: [phone]   Marylynn Pearson 07/22/2017, 11:30 AM

## 2017-07-22 NOTE — Progress Notes (Signed)
PROGRESS NOTE  Rebecca [name] ZOX:[id number] DOB: 09/11/1966 DOA: 07/18/2017 PCP: [facility]   LOS: 3 days   Brief Narrative / Interim history: Rebecca I Floresis a 51 y.o.femalewith medical history significant for insulin-dependent diabetes mellitus, coronary artery disease, and hypertension, now presenting to the emergency department for evaluation of throbbing right great toe pain. She was found to have toe osteomyelitis and is s/p amputation 10/31  Assessment & Plan: Principal Problem:   Toe ulcer, right (HCC) Active Problems:   DM (diabetes mellitus), type 2, uncontrolled (HCC)   Essential hypertension   CAD- with distal LAD disease, ? 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   Mild renal insufficiency   Cellulitis in diabetic foot (HCC)   Diabetic foot ulcer (HCC)   Osteomyelitis of right foot (HCC)   Acute osteomyelitis of toe of right foot (HCC)   Non Healing Diabetic Toe Ulcer, Osteomyelitis -She report last time she took oral antibiotics was 6 month ago. Over last 2 months she started to have problem again with ulcer, it has open , and has been draining bloody secretion. She presented with worsening pain.  -MRI consistent with osteomyelitis distal phalanges great toe.  -continue IV antibiotics for today, s/p amputation 10/31 -Not safe to go home today per physical therapy, she will need another session tomorrow and potentially can go home afterwards  Insulin DM type 1 -On levemir while inpatient. Increase levemir.  -SSI -non compliant with her diet  Acute Renal Insufficiency -Hold ARB, Cr now normalized  HTN  -hold ARB due to AKI.   CAD  -No anginal complaints  -Continue Lipitor   DVT prophylaxis: Lovenox Code Status: Full code Family Communication: daughters bedside Disposition Plan: home 1 day once PT finishes evaluation  Consultants:   Orthopedic surgery   Procedures:   Right great toe amputation   Antimicrobials:  Vancomycin  10/29 >> 11/1  Ceftriaxone 10/29 >>11/1  Metronidazole 10/29 >>11/1  Augmentin 11/1 >> plan for 2-3 days post op  Subjective: -feeling well  Objective: Vitals:   07/21/17 1130 07/21/17 1700 07/21/17 2130 07/22/17 0950  BP:  (!) 141/81 (!) 157/82 134/77  Pulse:  94 (!) 102 94  Resp:  18 17 20   Temp: 97.9 F (36.6 C) 98 F (36.7 C) 97.7 F (36.5 C) (!) 97.1 F (36.2 C)  TempSrc:  Oral Oral Oral  SpO2:  100% 98% 99%  Weight:      Height:        Intake/Output Summary (Last 24 hours) at 07/22/17 1410 Last data filed at 07/22/17 1100  Gross per 24 hour  Intake              480 ml  Output                0 ml  Net              480 ml   Filed Weights   07/19/17 2048 07/20/17 2102 07/21/17 0835  Weight: 78.2 kg (172 lb 6.4 oz) 78.2 kg (172 lb 4.8 oz) 78 kg (172 lb)    Examination:  Constitutional: NAD Respiratory: CTA biL Cardiovascular: RRR  Data Reviewed: I have independently reviewed following labs and imaging studies   CBC:  Recent Labs Lab 07/18/17 2036 07/19/17 0339 07/21/17 0226  WBC 6.8 7.2 7.9  NEUTROABS 3.3 3.6  --   HGB 11.8* 12.7 11.9*  HCT 35.5* 38.0 36.3  MCV 82.4 82.3 83.4  PLT 322 304 275   Basic Metabolic  Panel:  Recent Labs Lab 07/18/17 2036 07/19/17 0339 07/21/17 0226  NA 133* 136 136  K 4.6 3.9 4.2  CL 101 103 105  CO2 24 25 23   GLUCOSE 422* 220* 248*  BUN 39* 33* 26*  CREATININE 1.35* 1.09* 0.86  CALCIUM 8.9 9.1 8.9   GFR: Estimated Creatinine Clearance: 69.8 mL/min (by C-G formula based on SCr of 0.86 mg/dL). Liver Function Tests: No results for input(s): AST, ALT, ALKPHOS, BILITOT, PROT, ALBUMIN in the last 168 hours. No results for input(s): LIPASE, AMYLASE in the last 168 hours. No results for input(s): AMMONIA in the last 168 hours. Coagulation Profile: No results for input(s): INR, PROTIME in the last 168 hours. Cardiac Enzymes: No results for input(s): CKTOTAL, CKMB, CKMBINDEX, TROPONINI in the last 168  hours. BNP (last 3 results) No results for input(s): PROBNP in the last 8760 hours. HbA1C: No results for input(s): HGBA1C in the last 72 hours. CBG:  Recent Labs Lab 07/21/17 1137 07/21/17 1659 07/21/17 2125 07/22/17 0802 07/22/17 1151  GLUCAP 252* 165* 177* 248* 252*   Lipid Profile: No results for input(s): CHOL, HDL, LDLCALC, TRIG, CHOLHDL, LDLDIRECT in the last 72 hours. Thyroid Function Tests: No results for input(s): TSH, T4TOTAL, FREET4, T3FREE, THYROIDAB in the last 72 hours. Anemia Panel: No results for input(s): VITAMINB12, FOLATE, FERRITIN, TIBC, IRON, RETICCTPCT in the last 72 hours. Urine analysis:    Component Value Date/Time   COLORURINE YELLOW 03/05/2016 0457   APPEARANCEUR CLEAR 03/05/2016 0457   LABSPEC 1.017 03/05/2016 0457   PHURINE 5.5 03/05/2016 0457   GLUCOSEU 500 (A) 03/05/2016 0457   HGBUR NEGATIVE 03/05/2016 0457   BILIRUBINUR NEGATIVE 03/05/2016 0457   KETONESUR NEGATIVE 03/05/2016 0457   PROTEINUR NEGATIVE 03/05/2016 0457   UROBILINOGEN 0.2 05/13/2015 0411   NITRITE NEGATIVE 03/05/2016 0457   LEUKOCYTESUR TRACE (A) 03/05/2016 0457   Sepsis Labs: Invalid input(s): PROCALCITONIN, LACTICIDVEN  Recent Results (from the past 240 hour(s))  Blood Cultures x 2 sites     Status: None (Preliminary result)   Collection Time: 07/19/17  3:20 AM  Result Value Ref Range Status   Specimen Description BLOOD LEFT ARM  Final   Special Requests   Final    BOTTLES DRAWN AEROBIC AND ANAEROBIC Blood Culture results may not be optimal due to an excessive volume of blood received in culture bottles   Culture NO GROWTH 2 DAYS  Final   Report Status PENDING  Incomplete  Blood Cultures x 2 sites     Status: None (Preliminary result)   Collection Time: 07/19/17  3:30 AM  Result Value Ref Range Status   Specimen Description BLOOD RIGHT HAND  Final   Special Requests   Final    BOTTLES DRAWN AEROBIC ONLY Blood Culture adequate volume   Culture NO GROWTH 2 DAYS   Final   Report Status PENDING  Incomplete  MRSA PCR Screening     Status: None   Collection Time: 07/21/17  8:00 AM  Result Value Ref Range Status   MRSA by PCR NEGATIVE NEGATIVE Final    Comment:        The GeneXpert MRSA Assay (FDA approved for NASAL specimens only), is one component of a comprehensive MRSA colonization surveillance program. It is not intended to diagnose MRSA infection nor to guide or monitor treatment for MRSA infections.     Radiology Studies: No results found.  Scheduled Meds: . amLODipine  5 mg Oral Daily  . atorvastatin  10 mg Oral q1800  .  docusate sodium  100 mg Oral BID  . enoxaparin (LOVENOX) injection  40 mg Subcutaneous Daily  . feeding supplement (PRO-STAT SUGAR FREE 64)  30 mL Oral BID  . gabapentin  600 mg Oral BID  . insulin aspart  0-15 Units Subcutaneous TID WC  . insulin aspart  0-5 Units Subcutaneous QHS  . insulin aspart  4 Units Subcutaneous TID WC  . insulin detemir  30 Units Subcutaneous QHS  . terbinafine   Topical BID   Continuous Infusions: . sodium chloride    . cefTRIAXone (ROCEPHIN)  IV Stopped (07/22/17 [id number])   And  . metronidazole Stopped (07/22/17 0732)  . lactated ringers 10 mL/hr at 07/21/17 0837  . methocarbamol (ROBAXIN)  IV      Pamella Pertostin Gherghe, MD, PhD [facility] Pager [phone]  If 7PM-7AM, please contact night-coverage [email or web address] Password [email or web address] 07/22/2017, 2:10 PM

## 2017-07-22 NOTE — Plan of Care (Signed)
Problem: Safety: Goal: Ability to remain free from injury will improve Outcome: Progressing Pt calls for assistance appropriately, call bell in reach, non skid socks on, family at bedside.

## 2017-07-22 NOTE — Progress Notes (Signed)
Patient ID: Rebecca [name], female   DOB: 06/14/1966, 51 y.o.   MRN: [id number] Postoperative day 1 right great toe amputation.  Patient's family member is present for interpretation.  Examination the dressing is clean and dry.  Plan for physical therapy progressive ambulation minimize weightbearing on the right lower extremity with a postoperative shoe.  Patient may be discharged from an orthopedic standpoint once she is safe with therapy.  The importance of elevation was discussed protected weightbearing change dressing in the office in 1 week with follow-up in the office in 1 week.

## 2017-07-23 LAB — GLUCOSE, CAPILLARY: GLUCOSE-CAPILLARY: 245 mg/dL — AB (ref 65–99)

## 2017-07-23 MED ORDER — HYDROCODONE-ACETAMINOPHEN 5-325 MG PO TABS: 1.0000 | ORAL_TABLET | ORAL | 0 refills | Status: DC | PRN

## 2017-07-23 MED ORDER — AMOXICILLIN-POT CLAVULANATE 875-125 MG PO TABS: 1.0000 | ORAL_TABLET | Freq: Two times a day (BID) | ORAL | 0 refills | Status: DC

## 2017-07-23 NOTE — Care Management (Signed)
    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  A cane, crutch or walker will not resolve  issue with performing activities of daily living. A wheelchair will allow patient to safely perform daily activities. Patient can safely propel the wheelchair in the home or has a caregiver who can provide assistance.  Accessories: elevating leg rests (ELRs), wheel locks, extensions and anti-tippers.   07/23/17 1021   07/22/17 1318  For home use only DME Walker rolling  Once    Question:  Patient needs a walker to treat with the following condition  Answer:  Toe amputation status (HCC)   07/22/17 1318

## 2017-07-23 NOTE — Progress Notes (Signed)
Physical Therapy Treatment Patient Details Name: Rebecca [name] MRN: [id number] DOB: 1966-04-07 Today's Date: 07/23/2017    History of Present Illness Pt is a 51 y/o spanish speaking female who presents s/p R great toe amputation on 07/21/17. Pt is currently TDWB on the R in post-op shoe.     PT Comments    Pt progressing towards physical therapy goals. Was able to perform transfers and ambulation with gross min guard assist for balance support and safety. Pt with increased difficulty with walker management due to painful R hand IV access. Focus of session was stair training, as pt has 17 stairs to her bedroom and bathroom at home. Pt required +2 assist for safety with negotiating stairs (seated, bumping up backwards), and pt reports she does not feel she can do this at home. After lengthy discussion with video interpreter assisting, pt decided she will stay downstairs at her home as she can sleep down there and will have a 1/2 bath. When it is time to shower, she will go to her daughter's home (bathroom on main level). As pt will be in and out of the house often, feel a wheelchair will be most appropriate so pt can maintain weight bearing restrictions and decrease risk of falls. Will continue to follow.   Follow Up Recommendations  Home health PT;Supervision for mobility/OOB     Equipment Recommendations  Rolling walker with 5" wheels;Wheelchair (measurements PT);Wheelchair cushion (measurements PT);3in1 (PT)    Recommendations for Other Services       Precautions / Restrictions Precautions Precautions: Fall Restrictions Weight Bearing Restrictions: Yes RLE Weight Bearing: Non weight bearing    Mobility  Bed Mobility Overal bed mobility: Modified Independent Bed Mobility: Supine to Sit           General bed mobility comments: Increased time but no assist required to transition to EOB. HOB flat and rails lowered to simulate home environment.   Transfers Overall transfer  level: Needs assistance Equipment used: Rolling walker (2 wheeled) Transfers: Sit to/from [employer] Sit to Stand: Min guard         General transfer comment: Hands-on assist required throughout transfer to full standing position. Increased time required and VC's for hand placement on seated surface for safety with the RW. Pt appeared to maintain weight bearing precautions well. Pt reports increased pain at IV site in hand with weight bearing through RW, and was somewhat limited because of that.   Ambulation/Gait Ambulation/Gait assistance: Min guard Ambulation Distance (Feet): 10 Feet Assistive device: Rolling walker (2 wheeled) Gait Pattern/deviations: Step-to pattern;Decreased stride length;Trunk flexed Gait velocity: Decreased Gait velocity interpretation: Below normal speed for age/gender General Gait Details: Pt continues to be limited by IV in R hand. Pt was not able to bear weight through the walker on the R side because of this, and minimal ambulation completed to get to/from chair and stairs.    Stairs Stairs: Yes   Stair Management: Seated/boosting;Backwards;Forwards Number of Stairs: 4 General stair comments: Pt was educated on negotiating stairs backwards with the RW for support. Due to painful IV access not able to attempt with RW. Pt then educated on bumping up stairs in a seated position. She was able to complete 4 stairs utilizing this method and bracing R hand with knuckles instead of palm. She reports that she does not feel she will be able to do this at home, as she has 17 stairs to get up to her bedroom/bathroom. +2 assist for safety required.  Wheelchair  Mobility    Modified Rankin (Stroke Patients Only)       Balance Overall balance assessment: Needs assistance Sitting-balance support: Feet supported;No upper extremity supported Sitting balance-Leahy Scale: Good     Standing balance support: Bilateral upper extremity supported;During  functional activity Standing balance-Leahy Scale: Poor Standing balance comment: Reliant on RW for support at this time.                             Cognition Arousal/Alertness: Awake/alert Behavior During Therapy: WFL for tasks assessed/performed Overall Cognitive Status: Within Functional Limits for tasks assessed                                 General Comments: Cognition normal per family      Exercises      General Comments        Pertinent Vitals/Pain Pain Assessment: No/denies pain    Home Living                      Prior Function            PT Goals (current goals can now be found in the care plan section) Acute Rehab PT Goals Patient Stated Goal: Independence PT Goal Formulation: With patient/family Time For Goal Achievement: 07/29/17 Potential to Achieve Goals: Good Progress towards PT goals: Progressing toward goals    Frequency    Min 3X/week      PT Plan Current plan remains appropriate    Co-evaluation              AM-PAC PT "6 Clicks" Daily Activity  Outcome Measure  Difficulty turning over in bed (including adjusting bedclothes, sheets and blankets)?: None Difficulty moving from lying on back to sitting on the side of the bed? : None Difficulty sitting down on and standing up from a chair with arms (e.g., wheelchair, bedside commode, etc,.)?: A Little Help needed moving to and from a bed to chair (including a wheelchair)?: A Little Help needed walking in hospital room?: A Little Help needed climbing 3-5 steps with a railing? : A Lot 6 Click Score: 19    End of Session Equipment Utilized During Treatment: Gait belt Activity Tolerance: Patient tolerated treatment well Patient left: in chair;with call bell/phone within reach;with chair alarm set;with family/visitor present Nurse Communication: Mobility status PT Visit Diagnosis: Unsteadiness on feet (R26.81);Pain;Difficulty in walking, not  elsewhere classified (R26.2) Pain - Right/Left: Right Pain - part of body: Ankle and joints of foot     Time: [id number] PT Time Calculation (min) (ACUTE ONLY): 29 min  Charges:  $Gait Training: 23-37 mins                    G Codes:       Conni SlipperLaura Ancelmo Hunt, PT, DPT Acute Rehabilitation Services Pager: [phone]    Marylynn PearsonLaura D Kearney Evitt 07/23/2017, 10:40 AM

## 2017-07-23 NOTE — Care Management Note (Signed)
Case Management Note  Patient Details  Name: Lenor Derricksidora I Flores MRN: [id number] Date of Birth: 06/08/1966  Subjective/Objective:                 Patient to DC to home today. Does not meet qualifications for charity home PT. Dr Elvera LennoxGherghe aware. [facility] assessing to see if patient will qualify for WC. Patient received MATCH letter (in [place] and [place] Reviewed with her and her daughter who will be assisting in her care after DC.    Action/Plan:   Expected Discharge Date:  07/23/17               Expected Discharge Plan:  Home/Self Care  In-House Referral:     Discharge planning Services  CM Consult, MATCH Program  Post Acute Care Choice:  Durable Medical Equipment Choice offered to:     DME Arranged:  Wheelchair manual DME Agency:  [facility]  [facility]     Status of Service:  Completed, signed off  If discussed at Long Length of Stay Meetings, dates discussed:    Additional Comments:  Lawerance SabalDebbie Kiersten Coss, RN 07/23/2017, 10:50 AM

## 2017-07-23 NOTE — Discharge Instructions (Signed)
Follow with [facility] in 5-7 days  Please get a complete blood count and chemistry panel checked by your Primary MD at your next visit, and again as instructed by your Primary MD. Please get your medications reviewed and adjusted by your Primary MD.  Please request your Primary MD to go over all Hospital Tests and Procedure/Radiological results at the follow up, please get all Hospital records sent to your Prim MD by signing hospital release before you go home.  If you had Pneumonia of Lung problems at the Hospital: Please get a 2 view Chest X ray done in 6-8 weeks after hospital discharge or sooner if instructed by your Primary MD.  If you have Congestive Heart Failure: Please call your Cardiologist or Primary MD anytime you have any of the following symptoms:  1) 3 pound weight gain in 24 hours or 5 pounds in 1 week  2) shortness of breath, with or without a dry hacking cough  3) swelling in the hands, feet or stomach  4) if you have to sleep on extra pillows at night in order to breathe  Follow cardiac low salt diet and 1.5 lit/day fluid restriction.  If you have diabetes Accuchecks 4 times/day, Once in AM empty stomach and then before each meal. Log in all results and show them to your primary doctor at your next visit. If any glucose reading is under 80 or above 300 call your primary MD immediately.  If you have Seizure/Convulsions/Epilepsy: Please do not drive, operate heavy machinery, participate in activities at heights or participate in high speed sports until you have seen by Primary MD or a Neurologist and advised to do so again.  If you had Gastrointestinal Bleeding: Please ask your Primary MD to check a complete blood count within one week of discharge or at your next visit. Your endoscopic/colonoscopic biopsies that are pending at the time of discharge, will also need to followed by your Primary MD.  Get Medicines reviewed and adjusted. Please  take all your medications with you for your next visit with your Primary MD  Please request your Primary MD to go over all hospital tests and procedure/radiological results at the follow up, please ask your Primary MD to get all Hospital records sent to his/her office.  If you experience worsening of your admission symptoms, develop shortness of breath, life threatening emergency, suicidal or homicidal thoughts you must seek medical attention immediately by calling 911 or calling your MD immediately  if symptoms less severe.  You must read complete instructions/literature along with all the possible adverse reactions/side effects for all the Medicines you take and that have been prescribed to you. Take any new Medicines after you have completely understood and accpet all the possible adverse reactions/side effects.   Do not drive or operate heavy machinery when taking Pain medications.   Do not take more than prescribed Pain, Sleep and Anxiety Medications  Special Instructions: If you have smoked or chewed Tobacco  in the last 2 yrs please stop smoking, stop any regular Alcohol  and or any Recreational drug use.  Wear Seat belts while driving.  Please note You were cared for by a hospitalist during your hospital stay. If you have any questions about your discharge medications or the care you received while you were in the hospital after you are discharged, you can call the unit and asked to speak with the hospitalist on call if the hospitalist that took care of you is  not available. Once you are discharged, your primary care physician will handle any further medical issues. Please note that NO REFILLS for any discharge medications will be authorized once you are discharged, as it is imperative that you return to your primary care physician (or establish a relationship with a primary care physician if you do not have one) for your aftercare needs so that they can reassess your need for medications  and monitor your lab values.  You can reach the hospitalist office at phone [phone] or fax [phone]   If you do not have a primary care physician, you can call [phone] for a physician referral.  Activity: As tolerated with Full fall precautions use walker/cane & assistance as needed  Diet: diabetic  Disposition Home

## 2017-07-23 NOTE — Discharge Summary (Signed)
Physician Discharge Summary  Rebecca [name] XBM:[id number] DOB: [birth year] DOA: 07/18/2017  PCP: [facility]  Admit date: 07/18/2017 Discharge date: 07/23/2017  Admitted From: home Disposition:  home  Recommendations for Outpatient Follow-up:  1. Follow up with Dr. Sharol Given in 1 week  Home Health: PT Equipment/Devices: wheelchair  Discharge Condition: stable CODE STATUS: Full code Diet recommendation: diabetic   HPI: Per Dr. Myna Hidalgo, Rebecca [name] is a 51 y.o. female with medical history significant for insulin-dependent diabetes mellitus, coronary artery disease, and hypertension, now presenting to the emergency department for evaluation of throbbing right great toe pain.  Patient reports that she has had an ulcer at the base of the right first toe for about a year now.  She reports that it had been draining recently and has become increasingly painful.  She suffers from diabetic neuropathy and often has associated pain in both feet, but now reports a severe throbbing pain that is new involving the right first toe.  No fevers or chills are reported.  She had been treated with antibiotics for this previously.  No chest pain or palpitations. ED Course: Upon arrival to the ED, patient is found to be afebrile, saturating well on room air, and with vitals otherwise stable.  Radiographs of the right foot features are questionable cortical lucency and irregularity at the medial and plantar aspect of the first right phalanx, possibly reflecting a small foci of osteomyelitis.  Chemistry panel reveals a sodium of 133, BUN 39, glucose 422, and serum creatinine 1.35, similar to priors.  CBC is notable for a stable chronic normocytic anemia with hemoglobin of 11.8.  ESR is mildly elevated at 54 and CRP is negative.  Patient was treated with vancomycin and Zosyn in the emergency department, remained hemodynamically stable and in no apparent respiratory distress, and will be  observed in the medical-surgical unit for ongoing evaluation and management of toe ulcer with concern for underlying osteomyelitis.  Hospital Course: Discharge Diagnoses:  Principal Problem:   Toe ulcer, right (HCC) Active Problems:   DM (diabetes mellitus), type 2, uncontrolled ([place])   Essential hypertension   CAD- with distal LAD disease, ? 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   Mild renal insufficiency   Cellulitis in diabetic foot (HCC)   Diabetic foot ulcer (HCC)   Osteomyelitis of right foot (HCC)   Acute osteomyelitis of toe of right foot (HCC)   Non Healing Diabetic Toe Ulcer, Osteomyelitis -She report last time she took oral antibiotics was 6 month ago. Over last 2 months she started to have problem again with ulcer, it has open , and has been draining bloody secretion. She presented with worsening pain. MRI consistent with osteomyelitis distal phalanges great toe.  Patient was started on IV antibiotics, orthopedic surgery was consulted and she is status post toe amputation on 10/31.  She recovered well postop, was evaluated by physical therapy recommended home health PT.  Her antibiotics were transitioned to Augmentin for a few more days postop, she will have scheduled follow-up with Dr. Sharol Given from orthopedic surgery in 1 week in his office. Insulin DM type 1 - non compliant with her diet, discussed extensively with patient, she will try to make some dietary changes.  Continue home regimen. Acute Renal Insufficiency -creatinine now normalized with fluids HTN-resume home medications CAD -No anginal complaints, continue Lipitor   Discharge Instructions  Discharge Instructions    Elevate operative extremity    Complete by:  As directed    Touch down weight  bearing    Complete by:  As directed    Laterality:  right   Extremity:  Lower     Allergies as of 07/23/2017   No Known Allergies     Medication List    TAKE these medications   amoxicillin-clavulanate 875-125 MG tablet Commonly  known as:  AUGMENTIN Take 1 tablet by mouth every 12 (twelve) hours.   aspirin 81 MG EC tablet Take 1 tablet (81 mg total) by mouth daily.   atorvastatin 80 MG tablet Commonly known as:  LIPITOR Take 1 tablet (80 mg total) by mouth daily at 6 PM. What changed:  how much to take   gabapentin 600 MG tablet Commonly known as:  NEURONTIN Take 2 tablets (1,200 mg total) by mouth at bedtime. What changed:  how much to take  when to take this   HYDROcodone-acetaminophen 5-325 MG tablet Commonly known as:  NORCO/VICODIN Take 1-2 tablets by mouth every 4 (four) hours as needed for moderate pain.   insulin aspart protamine- aspart (70-30) 100 UNIT/ML injection Commonly known as:  NOVOLOG MIX 70/30 Inject 30-40 Units into the skin 2 (two) times daily. 40 units in the morning and 30 units in the evening   nitroGLYCERIN 0.4 MG SL tablet Commonly known as:  NITROSTAT Place 1 tablet (0.4 mg total) under the tongue every 5 (five) minutes as needed for chest pain.   olmesartan 20 MG tablet Commonly known as:  BENICAR Take 20 mg by mouth daily.            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  A cane, crutch or walker will not resolve  issue with performing activities of daily living. A wheelchair will allow patient to safely perform daily activities. Patient can safely propel the wheelchair in the home or has a caregiver who can provide assistance.  Accessories: elevating leg rests (ELRs), wheel locks, extensions and anti-tippers.   07/23/17 1021   07/22/17 1318  For home use only DME Walker rolling  Once    Question:  Patient needs a walker to treat with the following condition  Answer:  Toe amputation  status ([place])   07/22/17 1318       Discharge Care Instructions        Start     Ordered   07/22/17 0000  Touch down weight bearing    Question Answer Comment  Laterality right   Extremity Lower      07/22/17 0654     Follow-up Information    Newt Minion, MD Follow up in 1 week(s).   Specialty:  Orthopedic Surgery Contact information: [place] [address] [phone]           Consultations:  Orthopedic surgery  Procedures/Studies:  Right great toe amputation 10/31  Mr Toes Right Wo Contrast  Result Date: 07/19/2017 CLINICAL DATA:  Right great toe pain.  Diabetic foot ulcer. EXAM: MRI OF THE RIGHT TOES WITHOUT CONTRAST TECHNIQUE: Multiplanar, multisequence MR imaging of the right forefoot was performed. No intravenous contrast was administered. COMPARISON:  Radiographs dated 07/18/2017 FINDINGS: Bones/Joint/Cartilage There is abnormal signal from the distal phalanx of the right great  toe consistent with osteomyelitis. No discrete bone destruction or joint effusion. The other bones of the forefoot appear normal. Soft tissues Focal edema in the soft tissues of the medial plantar aspect of the great toe. IMPRESSION: Findings consistent with osteomyelitis of the distal phalanx of the right great toe. Adjacent cellulitis. Electronically Signed   By: Lorriane Shire M.D.   On: 07/19/2017 07:00   Dg Toe Great Right  Result Date: 07/18/2017 CLINICAL DATA:  Diabetic ulcer EXAM: RIGHT GREAT TOE COMPARISON:  06/15/2017 FINDINGS: No fracture or malalignment. No soft tissue emphysema. Minimal cortical irregularity at the tuft of the distal phalanx with some lucency on the medial side IMPRESSION: 1. No fracture 2. Questionable cortical lucency and irregularity medial and plantar surface of the first distal phalanx, cannot exclude small foci of osteomyelitis. Electronically Signed   By: Donavan Foil M.D.   On: 07/18/2017 23:38     Subjective: - no  chest pain, shortness of breath, no abdominal pain, nausea or vomiting.   Discharge Exam: Vitals:   07/23/17 0622 07/23/17 1100  BP: (!) 156/82 117/73  Pulse: 86 88  Resp: 18 18  Temp: 98.4 F (36.9 C) 98 F (36.7 C)  SpO2: 98% 100%    General: Pt is alert, awake, not in acute distress Cardiovascular: RRR, S1/S2 +, no rubs, no gallops Respiratory: CTA bilaterally, no wheezing, no rhonchi Abdominal: Soft, NT, ND, bowel sounds + Extremities: no edema, no cyanosis    The results of significant diagnostics from this hospitalization (including imaging, microbiology, ancillary and laboratory) are listed below for reference.     Microbiology: Recent Results (from the past 240 hour(s))  Blood Cultures x 2 sites     Status: None (Preliminary result)   Collection Time: 07/19/17  3:20 AM  Result Value Ref Range Status   Specimen Description BLOOD LEFT ARM  Final   Special Requests   Final    BOTTLES DRAWN AEROBIC AND ANAEROBIC Blood Culture results may not be optimal due to an excessive volume of blood received in culture bottles   Culture NO GROWTH 4 DAYS  Final   Report Status PENDING  Incomplete  Blood Cultures x 2 sites     Status: None (Preliminary result)   Collection Time: 07/19/17  3:30 AM  Result Value Ref Range Status   Specimen Description BLOOD RIGHT HAND  Final   Special Requests   Final    BOTTLES DRAWN AEROBIC ONLY Blood Culture adequate volume   Culture NO GROWTH 4 DAYS  Final   Report Status PENDING  Incomplete  MRSA PCR Screening     Status: None   Collection Time: 07/21/17  8:00 AM  Result Value Ref Range Status   MRSA by PCR NEGATIVE NEGATIVE Final    Comment:        The GeneXpert MRSA Assay (FDA approved for NASAL specimens only), is one component of a comprehensive MRSA colonization surveillance program. It is not intended to diagnose MRSA infection nor to guide or monitor treatment for MRSA infections.      Labs: BNP (last 3 results) No  results for input(s): BNP in the last 8760 hours. Basic Metabolic Panel:  Recent Labs Lab 07/18/17 2036 07/19/17 0339 07/21/17 0226  NA 133* 136 136  K 4.6 3.9 4.2  CL 101 103 105  CO2 _0 GLUCOSE 422* 220* 248*  BUN 39* 33* 26*  CREATININE 1.35* 1.09* 0.86  CALCIUM 8.9 9.1 8.9   Liver Function Tests: No  results for input(s): AST, ALT, ALKPHOS, BILITOT, PROT, ALBUMIN in the last 168 hours. No results for input(s): LIPASE, AMYLASE in the last 168 hours. No results for input(s): AMMONIA in the last 168 hours. CBC:  Recent Labs Lab 07/18/17 2036 07/19/17 0339 07/21/17 0226  WBC 6.8 7.2 7.9  NEUTROABS 3.3 3.6  --   HGB 11.8* 12.7 11.9*  HCT 35.5* 38.0 36.3  MCV 82.4 82.3 83.4  PLT 322 304 275   Cardiac Enzymes: No results for input(s): CKTOTAL, CKMB, CKMBINDEX, TROPONINI in the last 168 hours. BNP: Invalid input(s): POCBNP CBG:  Recent Labs Lab 07/22/17 0802 07/22/17 1151 07/22/17 1702 07/22/17 2050 07/23/17 0809  GLUCAP 248* 252* 93 110* 245*   D-Dimer No results for input(s): DDIMER in the last 72 hours. Hgb A1c No results for input(s): HGBA1C in the last 72 hours. Lipid Profile No results for input(s): CHOL, HDL, LDLCALC, TRIG, CHOLHDL, LDLDIRECT in the last 72 hours. Thyroid function studies No results for input(s): TSH, T4TOTAL, T3FREE, THYROIDAB in the last 72 hours.  Invalid input(s): FREET3 Anemia work up No results for input(s): VITAMINB12, FOLATE, FERRITIN, TIBC, IRON, RETICCTPCT in the last 72 hours. Urinalysis    Component Value Date/Time   COLORURINE YELLOW 03/05/2016 0457   APPEARANCEUR CLEAR 03/05/2016 0457   LABSPEC 1.017 03/05/2016 0457   PHURINE 5.5 03/05/2016 0457   GLUCOSEU 500 (A) 03/05/2016 0457   HGBUR NEGATIVE 03/05/2016 0457   BILIRUBINUR NEGATIVE 03/05/2016 0457   KETONESUR NEGATIVE 03/05/2016 0457   PROTEINUR NEGATIVE 03/05/2016 0457   UROBILINOGEN 0.2 05/13/2015 0411   NITRITE NEGATIVE 03/05/2016 0457    LEUKOCYTESUR TRACE (A) 03/05/2016 0457   Sepsis Labs Invalid input(s): PROCALCITONIN,  WBC,  LACTICIDVEN   Time coordinating discharge: 25 minutes  SIGNED:  Marzetta Board, MD  [facility] 07/23/2017, 3:10 PM Pager [phone]  If 7PM-7AM, please contact night-coverage [email or web address] Password [email or web address]

## 2017-07-23 NOTE — Care Management (Signed)
    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  A cane, crutch or walker will not resolve  issue with performing activities of daily living. A wheelchair will allow patient to safely perform daily activities. Patient can safely propel the wheelchair in the home or has a caregiver who can provide assistance.  Accessories: elevating leg rests (ELRs), wheel locks, extensions and anti-tippers.   07/23/17 1021   07/22/17 1318  For home use only DME Walker rolling  Once    Question:  Patient needs a walker to treat with the following condition  Answer:  Toe amputation status (HCC)   07/22/17 1318     

## 2017-07-24 LAB — CULTURE, BLOOD (ROUTINE X 2)
CULTURE: NO GROWTH
Culture: NO GROWTH
SPECIAL REQUESTS: ADEQUATE

## 2017-07-26 ENCOUNTER — Other Ambulatory Visit (INDEPENDENT_AMBULATORY_CARE_PROVIDER_SITE_OTHER): Payer: Self-pay | Admitting: Orthopedic Surgery

## 2017-07-26 ENCOUNTER — Telehealth (INDEPENDENT_AMBULATORY_CARE_PROVIDER_SITE_OTHER): Payer: Self-pay | Admitting: Orthopedic Surgery

## 2017-07-26 MED ORDER — HYDROCODONE-ACETAMINOPHEN 5-325 MG PO TABS: 1.0000 | ORAL_TABLET | ORAL | 0 refills | Status: DC | PRN

## 2017-07-26 NOTE — Telephone Encounter (Signed)
[name],Rebecca 1965/11/19    Pt will run out of pain before sched appt time  07/29/2017 @2 :45pm  Please call pt daughter mom doesn't speak [name]  [phone]

## 2017-07-26 NOTE — Telephone Encounter (Signed)
rx written

## 2017-07-27 NOTE — Telephone Encounter (Signed)
Called pt and lm on vm to advise that rx is ready for pick up

## 2017-07-29 ENCOUNTER — Encounter (INDEPENDENT_AMBULATORY_CARE_PROVIDER_SITE_OTHER): Payer: Self-pay | Admitting: Orthopedic Surgery

## 2017-07-29 ENCOUNTER — Ambulatory Visit (INDEPENDENT_AMBULATORY_CARE_PROVIDER_SITE_OTHER): Payer: Self-pay | Admitting: Orthopedic Surgery

## 2017-07-29 DIAGNOSIS — M86171 Other acute osteomyelitis, right ankle and foot: Secondary | ICD-10-CM

## 2017-07-29 DIAGNOSIS — E1165 Type 2 diabetes mellitus with hyperglycemia: Secondary | ICD-10-CM

## 2017-07-29 NOTE — Progress Notes (Signed)
   Post-Op Visit Note   Patient: Rebecca [name]           Date of Birth: 01/09/1966           MRN: [id number] Visit Date: 07/29/2017 PCP: [facility]  Chief Complaint:  Chief Complaint  Patient presents with  . Right Foot - Routine Post Op    Right GT Amputation 8 days post op     HPI:  HPI The patient is a 51 year old woman seen 1 week status post right great toe amputation. Has been ambulating with walker.   Ortho Exam Incision well approximated with sutures. Mild swelling. No drainage or erythema or sign of infection.  Visit Diagnoses:  1. Acute osteomyelitis of toe of right foot (HCC)   2. Uncontrolled type 2 diabetes mellitus with hyperglycemia (HCC)     Plan: begin daily dial soap cleansing. Apply dry dressing. Minimize weight bearing. Elevate as able. Follow up in office in 1 week, eval for suture removal at that time.   Follow-Up Instructions: Return in about 1 week (around 08/05/2017).   Imaging: No results found.  Orders:  No orders of the defined types were placed in this encounter.  No orders of the defined types were placed in this encounter.    PMFS History: Patient Active Problem List   Diagnosis Date Noted  . Acute osteomyelitis of toe of right foot (HCC)   . Mild renal insufficiency 07/19/2017  . AKI (acute kidney injury) (HCC) 03/04/2016  . Essential hypertension 05/14/2015  . CAD- with distal LAD disease, ? 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 05/14/2015  . NSTEMI (non-ST elevated myocardial infarction) (HCC)   . Syncope 05/13/2015  . Hyperglycemia 10/25/2014  . Abrasion of thigh, left 10/25/2014  . DM (diabetes mellitus), type 2, uncontrolled (HCC)   . DM hyperosmolar coma, type 2 (HCC) 05/22/2012  . Hyperosmolar syndrome 05/21/2012   Past Medical History:  Diagnosis Date  . Anemia   . Diabetes mellitus   . Dyslipidemia   . Hypertension   . Non-STEMI (non-ST elevated myocardial infarction) [facility](HCC)    Medical therapy for  distal LAD dissection    Family History  Problem Relation Age of Onset  . Hypertension Father   . Hyperlipidemia Father   . Cancer Mother   . Diabetes Sister   . Diabetes Brother   . Diabetes Sister   . Diabetes Sister   . Diabetes Sister   . Diabetes Brother   . Diabetes Brother   . Diabetes Brother   . Diabetes Brother     History reviewed. No pertinent surgical history. Social History   Occupational History  . Not on file  Tobacco Use  . Smoking status: Never Smoker  . Smokeless tobacco: Never Used  Substance and Sexual Activity  . Alcohol use: No  . Drug use: No  . Sexual activity: Yes

## 2017-08-05 ENCOUNTER — Ambulatory Visit (INDEPENDENT_AMBULATORY_CARE_PROVIDER_SITE_OTHER): Payer: Self-pay | Admitting: Orthopedic Surgery

## 2017-08-05 ENCOUNTER — Encounter (INDEPENDENT_AMBULATORY_CARE_PROVIDER_SITE_OTHER): Payer: Self-pay | Admitting: Orthopedic Surgery

## 2017-08-05 VITALS — Ht 59.0 in | Wt 172.0 lb

## 2017-08-05 DIAGNOSIS — Z89411 Acquired absence of right great toe: Secondary | ICD-10-CM

## 2017-08-05 DIAGNOSIS — IMO0002 Reserved for concepts with insufficient information to code with codable children: Secondary | ICD-10-CM

## 2017-08-05 NOTE — Progress Notes (Signed)
   Post-Op Visit Note   Patient: Rebecca [name]           Date of Birth: 12/15/1965           MRN: [id number] Visit Date: 08/05/2017 PCP: [facility]  Chief Complaint:  Chief Complaint  Patient presents with  . Right Foot - Routine Post Op    07/21/17 right great toe amputation     HPI:  HPI The patient is a 51 year old woman seen 2 weeks status post right great toe amputation. Has been ambulating with walker.   Ortho Exam Incision well approximated with sutures. Mild swelling. Scant bleeding. No drainage or erythema or sign of infection.  Visit Diagnoses:  1. Great toe amputation status, right (HCC)     Plan: sutures harvested. continue daily dial soap cleansing. Apply dry dressing. weight bearing as tolerated. Elevate as able. Follow up in office in 2 weeks.   Follow-Up Instructions: Return in about 2 weeks (around 08/19/2017).   Imaging: No results found.  Orders:  No orders of the defined types were placed in this encounter.  No orders of the defined types were placed in this encounter.    PMFS History: Patient Active Problem List   Diagnosis Date Noted  . Great toe amputation status, right (HCC) 08/05/2017  . Acute osteomyelitis of toe of right foot (HCC)   . Mild renal insufficiency 07/19/2017  . AKI (acute kidney injury) (HCC) 03/04/2016  . Essential hypertension 05/14/2015  . CAD- with distal LAD disease, ? 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 05/14/2015  . NSTEMI (non-ST elevated myocardial infarction) (HCC)   . Syncope 05/13/2015  . Hyperglycemia 10/25/2014  . Abrasion of thigh, left 10/25/2014  . DM (diabetes mellitus), type 2, uncontrolled (HCC)   . DM hyperosmolar coma, type 2 (HCC) 05/22/2012  . Hyperosmolar syndrome 05/21/2012   Past Medical History:  Diagnosis Date  . Anemia   . Diabetes mellitus   . Dyslipidemia   . Hypertension   . Non-STEMI (non-ST elevated myocardial infarction) [facility](HCC)    Medical therapy for distal LAD  dissection    Family History  Problem Relation Age of Onset  . Hypertension Father   . Hyperlipidemia Father   . Cancer Mother   . Diabetes Sister   . Diabetes Brother   . Diabetes Sister   . Diabetes Sister   . Diabetes Sister   . Diabetes Brother   . Diabetes Brother   . Diabetes Brother   . Diabetes Brother     Past Surgical History:  Procedure Laterality Date  . AMPUTATION Right 07/21/2017   Procedure: RIGHT GREAT TOE AMPUTATION;  Surgeon: Nadara Mustarduda, Marcus V, MD;  [facility] OR;  Service: Orthopedics;  Laterality: Right;  . CARDIAC CATHETERIZATION N/A 05/14/2015   Procedure: Left Heart Cath and Coronary Angiography;  Surgeon: Marykay Lexavid W Harding, MD; dLAD 60%>>95% (small vessel, ?dissection), CFX & RCA systems no sig dz, EF & LVEDP nl      Social History   Occupational History  . Not on file  Tobacco Use  . Smoking status: Never Smoker  . Smokeless tobacco: Never Used  Substance and Sexual Activity  . Alcohol use: No  . Drug use: No  . Sexual activity: Yes

## 2017-08-19 ENCOUNTER — Ambulatory Visit (INDEPENDENT_AMBULATORY_CARE_PROVIDER_SITE_OTHER): Payer: Self-pay | Admitting: Orthopedic Surgery

## 2017-08-19 ENCOUNTER — Encounter (INDEPENDENT_AMBULATORY_CARE_PROVIDER_SITE_OTHER): Payer: Self-pay | Admitting: Orthopedic Surgery

## 2017-08-19 DIAGNOSIS — Z89411 Acquired absence of right great toe: Secondary | ICD-10-CM

## 2017-08-19 DIAGNOSIS — IMO0002 Reserved for concepts with insufficient information to code with codable children: Secondary | ICD-10-CM

## 2017-08-19 NOTE — Progress Notes (Signed)
Office Visit Note   Patient: Rebecca [name]           Date of Birth: 04/24/1966           MRN: [id number] Visit Date: 08/19/2017              Requested by: [facility] [address] [place], [place] [id number] PCP: [facility]  Chief Complaint  Patient presents with  . Right Foot - Routine Post Op, Wound Check, Follow-up      HPI: Patient is a 51 year old woman who presents in follow-up 4 weeks status post right great toe amputation at the MTP joint.  Patient denies any pain.  Patient is currently ambulating in soft slippers.  Assessment & Plan: Visit Diagnoses:  1. Great toe amputation status, right [facility](HCC)     Plan: Patient was given instructions to use a stiff soled sneaker like a Trail running sneaker.  She was given instructions and demonstrated heel cord stretching.  She can increase her activities and weightbearing as tolerated follow-up as needed  Follow-Up Instructions: Return if symptoms worsen or fail to improve.   Ortho Exam  Patient is alert, oriented, no adenopathy, well-dressed, normal affect, normal respiratory effort. On examination patient has dorsiflexion to neutral.  The incision is well-healed there is no redness no cellulitis no drainage she does have some mild swelling  Imaging: No results found. No images are attached to the encounter.  Labs: Lab Results  Component Value Date   HGBA1C 8.3 (H) 07/19/2017   HGBA1C 10.8 (H) 05/13/2015   HGBA1C 13.5 (H) 10/25/2014   ESRSEDRATE 54 (H) 07/18/2017   CRP <0.8 07/18/2017   REPTSTATUS 07/24/2017 FINAL 07/19/2017   CULT NO GROWTH 5 DAYS 07/19/2017    @LABSALLVALUES (HGBA1)@  @BMI1 @  Orders:  No orders of the defined types were placed in this encounter.  No orders of the defined types were placed in this encounter.    Procedures: No procedures performed  Clinical Data: No additional findings.  ROS:  All other systems negative,  except as noted in the HPI. Review of Systems  Objective: Vital Signs: There were no vitals taken for this visit.  Specialty Comments:  No specialty comments available.  PMFS History: Patient Active Problem List   Diagnosis Date Noted  . Great toe amputation status, right (HCC) 08/05/2017  . Acute osteomyelitis of toe of right foot (HCC)   . Mild renal insufficiency 07/19/2017  . AKI (acute kidney injury) (HCC) 03/04/2016  . Essential hypertension 05/14/2015  . CAD- with distal LAD disease, ? 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 05/14/2015  . NSTEMI (non-ST elevated myocardial infarction) (HCC)   . Syncope 05/13/2015  . Hyperglycemia 10/25/2014  . Abrasion of thigh, left 10/25/2014  . DM (diabetes mellitus), type 2, uncontrolled (HCC)   . DM hyperosmolar coma, type 2 (HCC) 05/22/2012  . Hyperosmolar syndrome 05/21/2012   Past Medical History:  Diagnosis Date  . Anemia   . Diabetes mellitus   . Dyslipidemia   . Hypertension   . Non-STEMI (non-ST elevated myocardial infarction) [facility](HCC)    Medical therapy for distal LAD dissection    Family History  Problem Relation Age of Onset  . Hypertension Father   . Hyperlipidemia Father   . Cancer Mother   . Diabetes Sister   . Diabetes Brother   . Diabetes Sister   . Diabetes Sister   . Diabetes Sister   . Diabetes Brother   . Diabetes Brother   .  Diabetes Brother   . Diabetes Brother     Past Surgical History:  Procedure Laterality Date  . AMPUTATION Right 07/21/2017   Procedure: RIGHT GREAT TOE AMPUTATION;  Surgeon: Nadara Mustarduda, Jaiden Dinkins V, MD;  [facility];  Service: Orthopedics;  Laterality: Right;  . CARDIAC CATHETERIZATION N/A 05/14/2015   Procedure: Left Heart Cath and Coronary Angiography;  Surgeon: Marykay Lexavid W Harding, MD; dLAD 60%>>95% (small vessel, ?dissection), CFX & RCA systems no sig dz, EF & LVEDP nl      Social History   Occupational History  . Not on file  Tobacco Use  . Smoking status: Never Smoker  . Smokeless tobacco: Never  Used  Substance and Sexual Activity  . Alcohol use: No  . Drug use: No  . Sexual activity: Yes

## 2018-03-18 ENCOUNTER — Ambulatory Visit (INDEPENDENT_AMBULATORY_CARE_PROVIDER_SITE_OTHER): Payer: Self-pay | Admitting: Physician Assistant

## 2018-04-12 ENCOUNTER — Encounter (INDEPENDENT_AMBULATORY_CARE_PROVIDER_SITE_OTHER): Payer: Self-pay | Admitting: Physician Assistant

## 2018-04-12 ENCOUNTER — Ambulatory Visit (INDEPENDENT_AMBULATORY_CARE_PROVIDER_SITE_OTHER): Payer: Self-pay | Admitting: Physician Assistant

## 2018-04-12 ENCOUNTER — Other Ambulatory Visit: Payer: Self-pay

## 2018-04-12 VITALS — BP 146/90 | HR 90 | Temp 98.1°F | Ht 61.5 in | Wt 175.8 lb

## 2018-04-12 DIAGNOSIS — E1165 Type 2 diabetes mellitus with hyperglycemia: Secondary | ICD-10-CM

## 2018-04-12 DIAGNOSIS — Z760 Encounter for issue of repeat prescription: Secondary | ICD-10-CM

## 2018-04-12 DIAGNOSIS — E7841 Elevated Lipoprotein(a): Secondary | ICD-10-CM

## 2018-04-12 DIAGNOSIS — E0590 Thyrotoxicosis, unspecified without thyrotoxic crisis or storm: Secondary | ICD-10-CM

## 2018-04-12 DIAGNOSIS — I10 Essential (primary) hypertension: Secondary | ICD-10-CM

## 2018-04-12 LAB — POCT GLYCOSYLATED HEMOGLOBIN (HGB A1C): HEMOGLOBIN A1C: 9.9 % — AB (ref 4.0–5.6)

## 2018-04-12 MED ORDER — ASPIRIN 81 MG PO TBEC: 81.0000 mg | DELAYED_RELEASE_TABLET | Freq: Every day | ORAL | 1 refills | Status: DC

## 2018-04-12 MED ORDER — METFORMIN HCL ER 500 MG PO TB24: 1000.0000 mg | ORAL_TABLET | Freq: Every day | ORAL | 5 refills | Status: DC

## 2018-04-12 MED ORDER — METHIMAZOLE 5 MG PO TABS: 5.0000 mg | ORAL_TABLET | Freq: Every day | ORAL | 5 refills | Status: DC

## 2018-04-12 MED ORDER — INSULIN GLARGINE 100 UNIT/ML SOLOSTAR PEN: 60.0000 [IU] | PEN_INJECTOR | Freq: Every day | SUBCUTANEOUS | 11 refills | Status: DC

## 2018-04-12 MED ORDER — NITROGLYCERIN 0.4 MG SL SUBL: 0.4000 mg | SUBLINGUAL_TABLET | SUBLINGUAL | 3 refills | Status: DC | PRN

## 2018-04-12 MED ORDER — ATORVASTATIN CALCIUM 20 MG PO TABS: 20.0000 mg | ORAL_TABLET | Freq: Every day | ORAL | 5 refills | Status: DC

## 2018-04-12 MED ORDER — LOSARTAN POTASSIUM 50 MG PO TABS
50.0000 mg | ORAL_TABLET | Freq: Every day | ORAL | 5 refills | Status: DC
Start: 2018-04-12 — End: 2018-05-11

## 2018-04-12 MED ORDER — GABAPENTIN 600 MG PO TABS: 1200.0000 mg | ORAL_TABLET | Freq: Three times a day (TID) | ORAL | 5 refills | Status: DC

## 2018-04-12 MED ORDER — PEN NEEDLES 31G X 8 MM MISC: 1.0000 | Freq: Every day | 11 refills | Status: DC

## 2018-04-12 MED FILL — LOSARTAN POTASSIUM 50 MG TA: 50 | 30 days supply | Qty: 30 | Fill #0

## 2018-04-12 MED FILL — METFORMIN HCL ER 500 MG TAB: 500 | 30 days supply | Qty: 60 | Fill #0

## 2018-04-12 MED FILL — !LANTUS SOLOSTAR 100UNITS/M: 100 | 25 days supply | Qty: 15 | Fill #0

## 2018-04-12 MED FILL — methIMAzole 5 MG TABS: 5 | 30 days supply | Qty: 30 | Fill #0

## 2018-04-12 MED FILL — GABAPENTIN 600 MG TABLET: 600 | 30 days supply | Qty: 180 | Fill #0

## 2018-04-12 MED FILL — ATORVASTATIN 20 MG TABLET: 20 | 30 days supply | Qty: 30 | Fill #0

## 2018-04-12 MED FILL — TRUEPLUS PEN NDL 31GX3/16: 31G X 5 MM | 30 days supply | Qty: 100 | Fill #0

## 2018-04-12 MED FILL — TRUEPLUS PEN NDL 31GX3/16": 31G X 5 MM | 30 days supply | Qty: 100 | Fill #0

## 2018-04-12 MED FILL — NITROSTAT 0.4 MG TABLET SL: 0.4 | 25 days supply | Qty: 25 | Fill #0

## 2018-04-12 NOTE — Progress Notes (Signed)
Subjective:  Patient ID: Rebecca [name], female    DOB: 07/28/1966  Age: 52 y.o. MRN: [id number]  CC: DM  HPI Rebecca Derricksidora I [name] is a 52 y.o. female with a medical history of DM2, diabetic polyneuropathy, HTN, HLD, hyperthyroidism, anemia, right hallux amputations and NSTEMI. Last A1c 8.3% eight months ago. A1c 9.9% in clinic today. Reports checking blood sugar TID with reading low of 97 and high of 546. Endorses tingling of the lower extremities and mild fatigue. Does not endorse polydipsia, polyuria, CP, palpitations, SOB, HA, abdominal pain, f/c/n/v, rash, or GI/GU sxs.     Outpatient Medications Prior to Visit  Medication Sig Dispense Refill  . atorvastatin (LIPITOR) 20 MG tablet Take 20 mg by mouth at bedtime.  2  . gabapentin (NEURONTIN) 600 MG tablet Take 2 tablets (1,200 mg total) by mouth at bedtime. (Patient taking differently: Take 600 mg by mouth 2 (two) times daily. ) 30 tablet 1  . insulin aspart protamine- aspart (NOVOLOG MIX 70/30) (70-30) 100 UNIT/ML injection Inject 30-40 Units into the skin 2 (two) times daily. 40 units in the morning and 30 units in the evening    . losartan (COZAAR) 25 MG tablet Take 25 mg by mouth daily.    . methimazole (TAPAZOLE) 5 MG tablet Take 5 mg by mouth daily.  1  . atorvastatin (LIPITOR) 80 MG tablet Take 1 tablet (80 mg total) by mouth daily at 6 PM. (Patient taking differently: Take 20 mg by mouth at bedtime. ) 30 tablet 11  . aspirin EC 81 MG EC tablet Take 1 tablet (81 mg total) by mouth daily. (Patient not taking: Reported on 04/12/2018)    . nitroGLYCERIN (NITROSTAT) 0.4 MG SL tablet Place 1 tablet (0.4 mg total) under the tongue every 5 (five) minutes as needed for chest pain. (Patient not taking: Reported on 04/12/2018) 25 tablet 3  . olmesartan (BENICAR) 20 MG tablet Take 20 mg by mouth daily.    Marland Kitchen. amoxicillin-clavulanate (AUGMENTIN) 875-125 MG tablet Take 1 tablet by mouth every 12 (twelve) hours. 6 tablet 0  . HYDROcodone-acetaminophen  (NORCO/VICODIN) 5-325 MG tablet Take 1-2 tablets every 4 (four) hours as needed by mouth for moderate pain. 12 tablet 0   No facility-administered medications prior to visit.      ROS Review of Systems  Constitutional: Negative for chills, fever and malaise/fatigue.  Eyes: Negative for blurred vision.  Respiratory: Negative for shortness of breath.   Cardiovascular: Negative for chest pain and palpitations.  Gastrointestinal: Negative for abdominal pain and nausea.  Genitourinary: Negative for dysuria and hematuria.  Musculoskeletal: Negative for joint pain and myalgias.  Skin: Negative for rash.  Neurological: Negative for tingling and headaches.  Psychiatric/Behavioral: Negative for depression. The patient is not nervous/anxious.     Objective:  BP (!) 146/90 (BP Location: Left Arm, Patient Position: Sitting, Cuff Size: Normal)   Pulse 90   Temp 98.1 F (36.7 C) (Oral)   Ht 5' 1.5" (1.562 m)   Wt 175 lb 12.8 oz (79.7 kg)   SpO2 99%   BMI 32.68 kg/m   BP/Weight 04/12/2018 08/05/2017 07/23/2017  Systolic BP 146 - 117  Diastolic BP 90 - 73  Wt. (Lbs) 175.8 172 -  BMI 32.68 34.74 -      Physical Exam  Constitutional: She is oriented to person, place, and time.  Well developed, obese, NAD, polite  HENT:  Head: Normocephalic and atraumatic.  Eyes: No scleral icterus.  Neck: Normal range of motion. Neck  supple. No thyromegaly present.  Cardiovascular: Normal rate, regular rhythm and normal heart sounds.  Pulmonary/Chest: Effort normal and breath sounds normal.  Musculoskeletal: She exhibits no edema.  Neurological: She is alert and oriented to person, place, and time.  Impaired light touch sensation of the LE bilaterally  Skin: Skin is warm and dry. No rash noted. No erythema. No pallor.  Psychiatric: She has a normal mood and affect. Her behavior is normal. Thought content normal.  Vitals reviewed.    Assessment & Plan:    1. Uncontrolled type 2 diabetes  mellitus with hyperglycemia (HCC) - HgB A1c 9.9% today - Increase gabapentin (NEURONTIN) 600 MG tablet; Take 2 tablets (1,200 mg total) by mouth 3 (three) times daily.  Dispense: 180 tablet; Refill: 5 - Begin Insulin Glargine (LANTUS SOLOSTAR) 100 UNIT/ML Solostar Pen; Inject 60 Units into the skin daily.  Dispense: 15 mL; Refill: 11 - Begin metFORMIN (GLUCOPHAGE XR) 500 MG 24 hr tablet; Take 2 tablets (1,000 mg total) by mouth daily with breakfast.  Dispense: 60 tablet; Refill: 5 - Begin Insulin Pen Needle (PEN NEEDLES) 31G X 8 MM MISC; Inject 1 each into the skin at bedtime.  Dispense: 30 each; Refill: 11 - Comprehensive metabolic panel  2. Hyperthyroidism - Refill methimazole (TAPAZOLE) 5 MG tablet; Take 1 tablet (5 mg total) by mouth daily.  Dispense: 30 tablet; Refill: 5 - Thyroid Panel With TSH  3. Elevated lipoprotein(a) - Refill atorvastatin (LIPITOR) 20 MG tablet; Take 1 tablet (20 mg total) by mouth at bedtime.  Dispense: 30 tablet; Refill: 5 - Lipid panel; Future  4. Medication refill - nitroGLYCERIN (NITROSTAT) 0.4 MG SL tablet; Place 1 tablet (0.4 mg total) under the tongue every 5 (five) minutes as needed for chest pain.  Dispense: 25 tablet; Refill: 3  5. Hypertension, unspecified type - Increase losartan (COZAAR) 50 MG tablet; Take 1 tablet (50 mg total) by mouth daily.  Dispense: 30 tablet; Refill: 5 - Refill aspirin 81 MG EC tablet; Take 1 tablet (81 mg total) by mouth daily.  Dispense: 90 tablet; Refill: 1   Meds ordered this encounter  Medications  . gabapentin (NEURONTIN) 600 MG tablet    Sig: Take 2 tablets (1,200 mg total) by mouth 3 (three) times daily.    Dispense:  180 tablet    Refill:  5    Order Specific Question:   Supervising Provider    Answer:   Hoy Register [[id number]]  . nitroGLYCERIN (NITROSTAT) 0.4 MG SL tablet    Sig: Place 1 tablet (0.4 mg total) under the tongue every 5 (five) minutes as needed for chest pain.    Dispense:  25 tablet    Refill:   3    Order Specific Question:   Supervising Provider    Answer:   Hoy Register [[id number]]  . methimazole (TAPAZOLE) 5 MG tablet    Sig: Take 1 tablet (5 mg total) by mouth daily.    Dispense:  30 tablet    Refill:  5    Order Specific Question:   Supervising Provider    Answer:   Hoy Register [[id number]]  . losartan (COZAAR) 50 MG tablet    Sig: Take 1 tablet (50 mg total) by mouth daily.    Dispense:  30 tablet    Refill:  5    Order Specific Question:   Supervising Provider    Answer:   Hoy Register [[id number]]  . atorvastatin (LIPITOR) 20 MG tablet    Sig: Take 1 tablet (  20 mg total) by mouth at bedtime.    Dispense:  30 tablet    Refill:  5    Order Specific Question:   Supervising Provider    Answer:   Hoy Register [[id number]]  . aspirin 81 MG EC tablet    Sig: Take 1 tablet (81 mg total) by mouth daily.    Dispense:  90 tablet    Refill:  1    Order Specific Question:   Supervising Provider    Answer:   Hoy Register [[id number]]  . Insulin Glargine (LANTUS SOLOSTAR) 100 UNIT/ML Solostar Pen    Sig: Inject 60 Units into the skin daily.    Dispense:  15 mL    Refill:  11    Order Specific Question:   Supervising Provider    Answer:   Hoy Register [[id number]]  . metFORMIN (GLUCOPHAGE XR) 500 MG 24 hr tablet    Sig: Take 2 tablets (1,000 mg total) by mouth daily with breakfast.    Dispense:  60 tablet    Refill:  5    Order Specific Question:   Supervising Provider    Answer:   Hoy Register [[id number]]  . Insulin Pen Needle (PEN NEEDLES) 31G X 8 MM MISC    Sig: Inject 1 each into the skin at bedtime.    Dispense:  30 each    Refill:  11    Order Specific Question:   Supervising Provider    Answer:   Hoy Register [[id number]]    Follow-up: Return in about 1 month (around 05/10/2018) for diabetes glucose log.   Loletta Specter PA

## 2018-04-12 NOTE — Patient Instructions (Signed)
Baker Janus la va a llamar para obtener informacion para la programa de papanicolaou y [place].    Diabetes mellitus y nutricin Diabetes Mellitus and Nutrition Si sufre de diabetes (diabetes mellitus), es muy importante tener hbitos alimenticios saludables debido a que sus niveles de Psychologist, counselling sangre (glucosa) se ven afectados en gran medida por lo que come y bebe. [place] alimentos saludables en las cantidades [place], aproximadamente a la [employer], [place] ayudar a:  Scientist, physiological glucemia.  Disminuir el riesgo de sufrir una enfermedad cardaca.  Mejorar la presin arterial.  Barista o mantener un peso saludable.  Todas las personas que sufren de diabetes son diferentes y cada una tiene necesidades diferentes en cuanto a un plan de alimentacin. El mdico puede recomendarle que trabaje con un especialista en dietas y nutricin (nutricionista) para Tax adviser plan para usted. Su plan de alimentacin puede variar segn factores como:  Las caloras que necesita.  Los medicamentos que toma.  Su peso.  Sus niveles de glucemia, presin arterial y colesterol.  Su nivel de [place].  Otras afecciones que tenga, como enfermedades cardacas o renales.  Cmo me afectan los carbohidratos? Los carbohidratos afectan el nivel de glucemia ms que cualquier otro tipo de alimento. La ingesta de carbohidratos naturalmente aumenta la cantidad glucosa en la sangre. El recuento de carbohidratos es un mtodo destinado a Midwife un registro de la cantidad de carbohidratos que se ingieren. El recuento de carbohidratos es importante para Pharmacologist la glucemia a un nivel saludable, en especial si utiliza insulina o toma determinados medicamentos por va oral para la diabetes. Es importante saber la cantidad de carbohidratos que se pueden ingerir en cada comida sin correr Surveyor, minerals. Esto es Government social research officer. El nutricionista puede ayudarlo a calcular  la cantidad de carbohidratos que debe ingerir en cada comida y colacin. Los alimentos que contienen carbohidratos incluyen:  Pan, cereal, arroz, pasta y galletas.  Papas y maz.  Guisantes, frijoles y lentejas.  Leche y Dentist.  Nils Pyle y [place].  Postres, como pasteles, galletitas, helado y caramelos.  Cmo me afecta el alcohol? El alcohol puede provocar disminuciones sbitas de la glucemia (hipoglucemia), en especial si utiliza insulina o toma determinados medicamentos por va oral para la diabetes. La hipoglucemia es una afeccin potencialmente mortal. Los sntomas de la hipoglucemia (somnolencia, mareos y confusin) son similares a los sntomas de haber consumido demasiado alcohol. Si el mdico afirma que el alcohol es seguro para usted, siga estas pautas:  Limite el consumo de alcohol a no ms de 1 medida por da si es mujer y no est Orthoptist, y a 2 medidas si es hombre. Una medida equivale a 12oz ( ) de cerveza, 5oz ( ) de vino o 1oz (44ml) de bebidas de alta graduacin alcohlica.  No beba con el estmago vaco.  Mantngase hidratado con agua, gaseosas dietticas o t helado sin azcar.  Tenga en cuenta que las gaseosas comunes, los jugos y otros refrescos pueden contener mucha azcar y se deben contar como carbohidratos.  Consejos para seguir Consulting civil engineer las etiquetas de los alimentos  Comience por controlar el tamao de la porcin en la etiqueta. La cantidad de caloras, carbohidratos, grasas y otros nutrientes mencionados en la etiqueta se basan en una porcin del alimento. Muchos alimentos contienen ms de una porcin por envase.  Verifique la cantidad total de gramos (g) de carbohidratos totales en una porcin. Puede calcular la cantidad de porciones de carbohidratos al dividir el  total de carbohidratos por 15. Por ejemplo, si un alimento posee un total de 30g de carbohidratos, equivale a 2 porciones de carbohidratos.  Verifique la cantidad de gramos  (g) de grasas saturadas y grasas trans en una porcin. Escoja alimentos que no contengan grasa o que tengan un bajo contenido.  Controle la cantidad de miligramos (mg) de sodio en una porcin. La mayora de las personas deben limitar la ingesta de sodio total a menos de 2300mg  por Futures traderda.  Siempre consulte la informacin nutricional de los alimentos etiquetados como "con bajo contenido de grasa" o "sin grasa". Estos alimentos pueden ser ms altos en azcar agregada o en carbohidratos refinados y deben evitarse.  Hable con el nutricionista para identificar sus objetivos diarios en cuanto a los nutrientes mencionados en la etiqueta. De compras  Evite comprar alimentos procesados, enlatados o prehechos. Estos alimentos tienden a Civil Service fast streamertener mayor cantidad de [place]asa, sodio y azcar agregada.  Compre en la zona exterior de la tienda de comestibles. Esta incluye frutas y Medtronicvegetales frescos, granos a granel, carnes frescas y productos lcteos frescos. Coccin  Utilice mtodos de coccin a baja temperatura, como hornear, en lugar de mtodos de coccin a alta temperatura, como frer en abundante aceite.  Cocine con aceites saludables, como el aceite de Balatonoliva, canola o Lebanongirasol.  Evite cocinar con manteca, crema o carnes con alto contenido de grasa. Planificacin de las comidas  TRW AutomotiveConsuma las comidas y las colaciones de forma regular, preferentemente a la misma hora todos Dahlgrenlos das. Evite pasar largos perodos de tiempo sin comer.  Consuma alimentos ricos en fibra, como frutas frescas, verduras, frijoles y cereales integrales. Consulte al nutricionista sobre cuntas porciones de carbohidratos puede consumir en cada comida.  Consuma entre 4 y 6 onzas de protenas magras por da, como carnes Steelemagras, pollo, pescado, Dillard'shuevos o tofu. 1 onza equivale a 1 onza de carne, pollo o pescado, 1 huevo, o 1/4 taza de tofu.  Coma algunos alimentos por da que contengan grasas saludables, como aguacates, frutos secos, semillas y  pescado. Estilo de vida   Controle su nivel de glucemia con regularidad.  Haga ejercicio al menos 30minutos, 5das o ms por semana, o como se lo haya indicado el mdico.  Tome los Monsanto Companymedicamentos como se lo haya indicado el mdico.  No consuma ningn producto que contenga nicotina o tabaco, como cigarrillos y Administrator, Civil Servicecigarrillos electrnicos. Si necesita ayuda para dejar de fumar, consulte al CIGNAmdico.  Trabaje con un asesor o instructor en diabetes para identificar estrategias para controlar el estrs y cualquier desafo emocional y social. Cules son algunas de las preguntas que puedo hacerle a mi mdico?  Es necesario que me rena con IT trainerun instructor en diabetes?  Es necesario que me rena con un nutricionista?  A qu nmero puedo llamar si tengo preguntas?  Cules son los mejores momentos para controlar la glucemia? Dnde encontrar ms informacin:  Asociacin Americana de la Diabetes (American Diabetes Association): diabetes.org/food-and-fitness/food  Academia de Nutricin y Pension scheme managerDiettica (Academy of Nutrition and Dietetics): https://www.vargas.com/www.eatright.org/resources/health/diseases-and-conditions/diabetes  The Krogernstituto Nacional de la Diabetes y las Enfermedades Digestivas y Special educational needs teacherenales [facility](National Institute of Diabetes and Digestive and Kidney Diseases) (Institutos Nacionales de ClarkSalud, NIH): FindJewelers.czwww.niddk.nih.gov/health-information/diabetes/overview/diet-eating-physical-activity Resumen  Un plan de alimentacin saludable lo ayudar a Scientist, physiologicalcontrolar la glucemia y Pharmacologistmantener un estilo de vida saludable.  Trabajar con un especialista en dietas y nutricin (nutricionista) puede ayudarlo a Designer, television/film setelaborar el mejor plan de alimentacin para usted.  Tenga en cuenta que los carbohidratos y el alcohol tienen efectos inmediatos en sus niveles de  glucemia. Es importante contar los carbohidratos y consumir alcohol con prudencia. Esta informacin no tiene Theme park manager el consejo del mdico. Asegrese de hacerle al mdico cualquier  pregunta que tenga. Document Released: 12/15/2007 Document Revised: 12/28/2016 Document Reviewed: 12/28/2016 Elsevier Interactive Patient Education  2018 [employer].

## 2018-04-13 ENCOUNTER — Other Ambulatory Visit (INDEPENDENT_AMBULATORY_CARE_PROVIDER_SITE_OTHER): Payer: Self-pay | Admitting: Physician Assistant

## 2018-04-13 DIAGNOSIS — E0590 Thyrotoxicosis, unspecified without thyrotoxic crisis or storm: Secondary | ICD-10-CM

## 2018-04-13 LAB — COMPREHENSIVE METABOLIC PANEL
ALBUMIN: 4.4 g/dL (ref 3.5–5.5)
ALK PHOS: 147 IU/L — AB (ref 39–117)
ALT: 30 IU/L (ref 0–32)
AST: 25 IU/L (ref 0–40)
Albumin/Globulin Ratio: 1.3 (ref 1.2–2.2)
BILIRUBIN TOTAL: 0.4 mg/dL (ref 0.0–1.2)
BUN / CREAT RATIO: 22 (ref 9–23)
BUN: 25 mg/dL — ABNORMAL HIGH (ref 6–24)
CHLORIDE: 103 mmol/L (ref 96–106)
CO2: 23 mmol/L (ref 20–29)
Calcium: 10 mg/dL (ref 8.7–10.2)
Creatinine, Ser: 1.13 mg/dL — ABNORMAL HIGH (ref 0.57–1.00)
GFR calc non Af Amer: 56 mL/min/{1.73_m2} — ABNORMAL LOW (ref >59)
GFR, EST AFRICAN AMERICAN: 65 mL/min/{1.73_m2} (ref >59)
GLOBULIN, TOTAL: 3.4 g/dL (ref 1.5–4.5)
GLUCOSE: 211 mg/dL — AB (ref 65–99)
Potassium: 4.4 mmol/L (ref 3.5–5.2)
SODIUM: 142 mmol/L (ref 134–144)
Total Protein: 7.8 g/dL (ref 6.0–8.5)

## 2018-04-13 LAB — THYROID PANEL WITH TSH
Free Thyroxine Index: 1.8 (ref 1.2–4.9)
T3 UPTAKE RATIO: 27 % (ref 24–39)
T4, Total: 6.8 ug/dL (ref 4.5–12.0)
TSH: 3.29 u[IU]/mL (ref 0.450–4.500)

## 2018-04-13 MED ORDER — METHIMAZOLE 5 MG PO TABS: 5.0000 mg | ORAL_TABLET | Freq: Every day | ORAL | 3 refills | Status: DC

## 2018-04-15 ENCOUNTER — Telehealth (INDEPENDENT_AMBULATORY_CARE_PROVIDER_SITE_OTHER): Payer: Self-pay

## 2018-04-15 NOTE — Telephone Encounter (Signed)
Call placed using pacific interpreter [phone]) patient is aware that thyroid is stable on methimazole 5 mg and that it has been refilled and sent to [facility] pharmacy. Kidney filtration mildly impaired but similar to previous. Keep appointment on 8/21 at 9:30 at [facility]. Clarified clinic location for patient and son. Maryjean Mornempestt S Tyia Binford, CMA

## 2018-04-22 ENCOUNTER — Other Ambulatory Visit: Payer: Self-pay | Admitting: Obstetrics and Gynecology

## 2018-04-22 DIAGNOSIS — Z1231 Encounter for screening mammogram for malignant neoplasm of breast: Secondary | ICD-10-CM

## 2018-05-11 ENCOUNTER — Other Ambulatory Visit: Payer: Self-pay

## 2018-05-11 ENCOUNTER — Encounter (INDEPENDENT_AMBULATORY_CARE_PROVIDER_SITE_OTHER): Payer: Self-pay | Admitting: Physician Assistant

## 2018-05-11 ENCOUNTER — Ambulatory Visit (INDEPENDENT_AMBULATORY_CARE_PROVIDER_SITE_OTHER): Payer: Self-pay | Admitting: Physician Assistant

## 2018-05-11 VITALS — BP 146/78 | HR 90 | Temp 97.7°F | Ht 62.0 in | Wt 178.0 lb

## 2018-05-11 DIAGNOSIS — Z794 Long term (current) use of insulin: Secondary | ICD-10-CM

## 2018-05-11 DIAGNOSIS — Z1211 Encounter for screening for malignant neoplasm of colon: Secondary | ICD-10-CM

## 2018-05-11 DIAGNOSIS — E119 Type 2 diabetes mellitus without complications: Secondary | ICD-10-CM

## 2018-05-11 DIAGNOSIS — M542 Cervicalgia: Secondary | ICD-10-CM

## 2018-05-11 DIAGNOSIS — I10 Essential (primary) hypertension: Secondary | ICD-10-CM

## 2018-05-11 DIAGNOSIS — Z23 Encounter for immunization: Secondary | ICD-10-CM

## 2018-05-11 MED ORDER — ACETAMINOPHEN 500 MG PO TABS: 1000.0000 mg | ORAL_TABLET | Freq: Three times a day (TID) | ORAL | 0 refills | Status: DC | PRN

## 2018-05-11 MED ORDER — CYCLOBENZAPRINE HCL 5 MG PO TABS: 5.0000 mg | ORAL_TABLET | Freq: Every day | ORAL | 0 refills | Status: DC

## 2018-05-11 MED ORDER — LOSARTAN POTASSIUM 100 MG PO TABS: 100.0000 mg | ORAL_TABLET | Freq: Every day | ORAL | 11 refills | Status: DC

## 2018-05-11 MED FILL — CYCLOBENZAPRINE 5 MG TABLET: 5 | 15 days supply | Qty: 15 | Fill #0

## 2018-05-11 NOTE — Progress Notes (Signed)
Subjective:  Patient ID: Rebecca [name], female    DOB: 12/19/1965  Age: 52 y.o. MRN: [id number]  CC: f/u glucose log.  HPI Rebecca [name] is a 52 y.o. female with a medical history of DM2, diabetic polyneuropathy, HTN, HLD, hyperthyroidism, anemia, right hallux amputations and NSTEMI presents for review of her glucose log. Last A1c 9.9% last month. Had reported blood sugar reading low of 97 and high of 546 last month. Was prescribed Lantus 60 units, metformin 1000 XR, Did not bring her glucose log but reports low of 110 and high of 140. Not exercising regularly because she is taking care of multiple young grandchildren. Does not endorse polydipsia, polyuria, fatigue, visual blurring, or tingling/numbness.    Complains of neck pain since approximately one week ago. Wakes up with a crink in her neck. Takes care of multiple grandchildren everyday. Does not endorse paresthesias, weakness, or any other associated symptoms.        Outpatient Medications Prior to Visit  Medication Sig Dispense Refill  . aspirin 81 MG EC tablet Take 1 tablet (81 mg total) by mouth daily. 90 tablet 1  . atorvastatin (LIPITOR) 20 MG tablet Take 1 tablet (20 mg total) by mouth at bedtime. 30 tablet 5  . gabapentin (NEURONTIN) 600 MG tablet Take 2 tablets (1,200 mg total) by mouth 3 (three) times daily. 180 tablet 5  . Insulin Glargine (LANTUS SOLOSTAR) 100 UNIT/ML Solostar Pen Inject 60 Units into the skin daily. 15 mL 11  . Insulin Pen Needle (PEN NEEDLES) 31G X 8 MM MISC Inject 1 each into the skin at bedtime. 30 each 11  . losartan (COZAAR) 50 MG tablet Take 1 tablet (50 mg total) by mouth daily. 30 tablet 5  . metFORMIN (GLUCOPHAGE XR) 500 MG 24 hr tablet Take 2 tablets (1,000 mg total) by mouth daily with breakfast. 60 tablet 5  . methimazole (TAPAZOLE) 5 MG tablet Take 1 tablet (5 mg total) by mouth daily. 90 tablet 3  . nitroGLYCERIN (NITROSTAT) 0.4 MG SL tablet Place 1 tablet (0.4 mg total) under the  tongue every 5 (five) minutes as needed for chest pain. 25 tablet 3  . olmesartan (BENICAR) 20 MG tablet Take 20 mg by mouth daily.     No facility-administered medications prior to visit.      ROS Review of Systems  Constitutional: Negative for chills, fever and malaise/fatigue.  Eyes: Negative for blurred vision.  Respiratory: Negative for shortness of breath.   Cardiovascular: Negative for chest pain and palpitations.  Gastrointestinal: Negative for abdominal pain and nausea.  Genitourinary: Negative for dysuria and hematuria.  Musculoskeletal: Positive for neck pain. Negative for joint pain and myalgias.  Skin: Negative for rash.  Neurological: Negative for tingling and headaches.  Psychiatric/Behavioral: Negative for depression. The patient is not nervous/anxious.     Objective:  BP (!) 146/78 (BP Location: Left Arm, Patient Position: Sitting, Cuff Size: Normal)   Pulse 90   Temp 97.7 F (36.5 C) (Oral)   Ht 5\' 2"  (1.575 m)   Wt 178 lb (80.7 kg)   SpO2 97%   BMI 32.56 kg/m   BP/Weight 05/11/2018 04/12/2018 08/05/2017  Systolic BP 146 146 -  Diastolic BP 78 90 -  Wt. (Lbs) 178 175.8 172  BMI 32.56 32.68 34.74      Physical Exam  Constitutional: She is oriented to person, place, and time.  Well developed, well nourished, NAD, polite  HENT:  Head: Normocephalic and atraumatic.  Eyes: No  scleral icterus.  Neck: Normal range of motion. Neck supple. No thyromegaly present.  Cardiovascular: Normal rate, regular rhythm and normal heart sounds.  Pulmonary/Chest: Effort normal and breath sounds normal.  Abdominal: Soft. Bowel sounds are normal. There is no tenderness.  Musculoskeletal: She exhibits no edema.  Mild TTP along the upper trapezius bilaterally. No muscular spasm of the upper trapezius.   Neurological: She is alert and oriented to person, place, and time.  Skin: Skin is warm and dry. No rash noted. No erythema. No pallor.  Psychiatric: She has a normal mood  and affect. Her behavior is normal. Thought content normal.  Vitals reviewed.    Assessment & Plan:   1. Type 2 diabetes mellitus without complication, with long-term current use of insulin (HCC) - Continue Metformin and Lantus at current doses - Exercise regularly for 30 minutes five days a week  2. Neck pain - Begin cyclobenzaprine (FLEXERIL) 5 MG tablet; Take 1 tablet (5 mg total) by mouth at bedtime.  Dispense: 15 tablet; Refill: 0 - Begin acetaminophen (TYLENOL) 500 MG tablet; Take 2 tablets (1,000 mg total) by mouth every 8 (eight) hours as needed.  Dispense: 42 tablet; Refill: 0  3. Need for influenza vaccination - Flu Vaccine QUAD 6+ mos PF IM (Fluarix Quad PF)  4. Special screening for malignant neoplasms, colon - Fecal occult blood, imunochemical   Meds ordered this encounter  Medications  . cyclobenzaprine (FLEXERIL) 5 MG tablet    Sig: Take 1 tablet (5 mg total) by mouth at bedtime.    Dispense:  15 tablet    Refill:  0    Order Specific Question:   Supervising Provider    Answer:   Hoy RegisterNEWLIN, ENOBONG [[id number]]  . acetaminophen (TYLENOL) 500 MG tablet    Sig: Take 2 tablets (1,000 mg total) by mouth every 8 (eight) hours as needed.    Dispense:  42 tablet    Refill:  0    Order Specific Question:   Supervising Provider    Answer:   Hoy RegisterNEWLIN, ENOBONG [[id number]]    Follow-up: 2 months for DM2 and A1c  Loletta Specteroger David Gomez PA

## 2018-05-11 NOTE — Patient Instructions (Signed)
Diabetes mellitus y nutrición  Diabetes Mellitus and Nutrition  Si sufre de diabetes (diabetes mellitus), es muy importante tener hábitos alimenticios saludables debido a que sus niveles de azúcar en la sangre (glucosa) se ven afectados en gran medida por lo que come y bebe. Comer alimentos saludables en las cantidades adecuadas, aproximadamente a la misma hora todos los días, lo ayudará a:  · Controlar la glucemia.  · Disminuir el riesgo de sufrir una enfermedad cardíaca.  · Mejorar la presión arterial.  · Alcanzar o mantener un peso saludable.    Todas las personas que sufren de diabetes son diferentes y cada una tiene necesidades diferentes en cuanto a un plan de alimentación. El médico puede recomendarle que trabaje con un especialista en dietas y nutrición (nutricionista) para elaborar el mejor plan para usted. Su plan de alimentación puede variar según factores como:  · Las calorías que necesita.  · Los medicamentos que toma.  · Su peso.  · Sus niveles de glucemia, presión arterial y colesterol.  · Su nivel de actividad.  · Otras afecciones que tenga, como enfermedades cardíacas o renales.    ¿Cómo me afectan los carbohidratos?  Los carbohidratos afectan el nivel de glucemia más que cualquier otro tipo de alimento. La ingesta de carbohidratos naturalmente aumenta la cantidad glucosa en la sangre. El recuento de carbohidratos es un método destinado a llevar un registro de la cantidad de carbohidratos que se ingieren. El recuento de carbohidratos es importante para mantener la glucemia a un nivel saludable, en especial si utiliza insulina o toma determinados medicamentos por vía oral para la diabetes.  Es importante saber la cantidad de carbohidratos que se pueden ingerir en cada comida sin correr ningún riesgo. Esto es diferente en cada persona. El nutricionista puede ayudarlo a calcular la cantidad de carbohidratos que debe ingerir en cada comida y colación.   Los alimentos que contienen carbohidratos incluyen:  · Pan, cereal, arroz, pasta y galletas.  · Papas y maíz.  · Guisantes, frijoles y lentejas.  · Leche y yogur.  · Frutas y jugo.  · Postres, como pasteles, galletitas, helado y caramelos.    ¿Cómo me afecta el alcohol?  El alcohol puede provocar disminuciones súbitas de la glucemia (hipoglucemia), en especial si utiliza insulina o toma determinados medicamentos por vía oral para la diabetes. La hipoglucemia es una afección potencialmente mortal. Los síntomas de la hipoglucemia (somnolencia, mareos y confusión) son similares a los síntomas de haber consumido demasiado alcohol.  Si el médico afirma que el alcohol es seguro para usted, siga estas pautas:  · Limite el consumo de alcohol a no más de 1 medida por día si es mujer y no está embarazada, y a 2 medidas si es hombre. Una medida equivale a 12 oz (355 ml) de cerveza, 5 oz (148 ml) de vino o 1½ oz (44 ml) de bebidas de alta graduación alcohólica.  · No beba con el estómago vacío.  · Manténgase hidratado con agua, gaseosas dietéticas o té helado sin azúcar.  · Tenga en cuenta que las gaseosas comunes, los jugos y otros refrescos pueden contener mucha azúcar y se deben contar como carbohidratos.    Consejos para seguir este plan  Leer las etiquetas de los alimentos  · Comience por controlar el tamaño de la porción en la etiqueta. La cantidad de calorías, carbohidratos, grasas y otros nutrientes mencionados en la etiqueta se basan en una porción del alimento. Muchos alimentos contienen más de una porción por envase.  · Verifique la cantidad total de gramos (g)   de carbohidratos totales en una porción. Puede calcular la cantidad de porciones de carbohidratos al dividir el total de carbohidratos por 15. Por ejemplo, si un alimento posee un total de 30 g de carbohidratos, equivale a 2 porciones de carbohidratos.  · Verifique la cantidad de gramos (g) de grasas saturadas y grasas trans  en una porción. Escoja alimentos que no contengan grasa o que tengan un bajo contenido.  · Controle la cantidad de miligramos (mg) de sodio en una porción. La mayoría de las personas deben limitar la ingesta de sodio total a menos de 2300 mg por día.  · Siempre consulte la información nutricional de los alimentos etiquetados como “con bajo contenido de grasa” o “sin grasa”. Estos alimentos pueden ser más altos en azúcar agregada o en carbohidratos refinados y deben evitarse.  · Hable con el nutricionista para identificar sus objetivos diarios en cuanto a los nutrientes mencionados en la etiqueta.  De compras  · Evite comprar alimentos procesados, enlatados o prehechos. Estos alimentos tienden a tener mayor cantidad de grasa, sodio y azúcar agregada.  · Compre en la zona exterior de la tienda de comestibles. Esta incluye frutas y vegetales frescos, granos a granel, carnes frescas y productos lácteos frescos.  Cocción  · Utilice métodos de cocción a baja temperatura, como hornear, en lugar de métodos de cocción a alta temperatura, como freír en abundante aceite.  · Cocine con aceites saludables, como el aceite de oliva, canola o girasol.  · Evite cocinar con manteca, crema o carnes con alto contenido de grasa.  Planificación de las comidas  · Consuma las comidas y las colaciones de forma regular, preferentemente a la misma hora todos los días. Evite pasar largos períodos de tiempo sin comer.  · Consuma alimentos ricos en fibra, como frutas frescas, verduras, frijoles y cereales integrales. Consulte al nutricionista sobre cuántas porciones de carbohidratos puede consumir en cada comida.  · Consuma entre 4 y 6 onzas de proteínas magras por día, como carnes magras, pollo, pescado, huevos o tofu. 1 onza equivale a 1 onza de carne, pollo o pescado, 1 huevo, o 1/4 taza de tofu.  · Coma algunos alimentos por día que contengan grasas saludables, como aguacates, frutos secos, semillas y pescado.  Estilo de vida     · Controle su nivel de glucemia con regularidad.  · Haga ejercicio al menos 30 minutos, 5 días o más por semana, o como se lo haya indicado el médico.  · Tome los medicamentos como se lo haya indicado el médico.  · No consuma ningún producto que contenga nicotina o tabaco, como cigarrillos y cigarrillos electrónicos. Si necesita ayuda para dejar de fumar, consulte al médico.  · Trabaje con un asesor o instructor en diabetes para identificar estrategias para controlar el estrés y cualquier desafío emocional y social.  ¿Cuáles son algunas de las preguntas que puedo hacerle a mi médico?  · ¿Es necesario que me reúna con un instructor en diabetes?  · ¿Es necesario que me reúna con un nutricionista?  · ¿A qué número puedo llamar si tengo preguntas?  · ¿Cuáles son los mejores momentos para controlar la glucemia?  Dónde encontrar más información:  · Asociación Americana de la Diabetes (American Diabetes Association): diabetes.org/food-and-fitness/food  · Academia de Nutrición y Dietética (Academy of Nutrition and Dietetics): www.eatright.org/resources/health/diseases-and-conditions/diabetes  · Instituto Nacional de la Diabetes y las Enfermedades Digestivas y Renales (National Institute of Diabetes and Digestive and Kidney Diseases) (Institutos Nacionales de Salud, NIH): www.niddk.nih.gov/health-information/diabetes/overview/diet-eating-physical-activity  Resumen  · Un plan de alimentación saludable   lo ayudará a controlar la glucemia y mantener un estilo de vida saludable.  · Trabajar con un especialista en dietas y nutrición (nutricionista) puede ayudarlo a elaborar el mejor plan de alimentación para usted.  · Tenga en cuenta que los carbohidratos y el alcohol tienen efectos inmediatos en sus niveles de glucemia. Es importante contar los carbohidratos y consumir alcohol con prudencia.  Esta información no tiene como fin reemplazar el consejo del médico. Asegúrese de hacerle al médico cualquier pregunta que tenga.   Document Released: 12/15/2007 Document Revised: 12/28/2016 Document Reviewed: 12/28/2016  Elsevier Interactive Patient Education © 2018 [employer]vier Inc.

## 2018-05-20 MED FILL — LOSARTAN POTASSIUM 100 MG T: 100 | 30 days supply | Qty: 30 | Fill #0

## 2018-05-20 MED FILL — !LANTUS SOLOSTAR 100UNITS/M: 100 | 25 days supply | Qty: 15 | Fill #1

## 2018-05-20 MED FILL — NITROSTAT 0.4 MG TABLET SL: 0.4 | 25 days supply | Qty: 25 | Fill #1

## 2018-06-16 ENCOUNTER — Ambulatory Visit (HOSPITAL_COMMUNITY)
Admission: RE | Admit: 2018-06-16 | Discharge: 2018-06-16 | Disposition: A | Discharge status: Home / Self Care | Payer: Self-pay | Source: Ambulatory Visit | Attending: Obstetrics and Gynecology | Admitting: Obstetrics and Gynecology

## 2018-06-16 ENCOUNTER — Ambulatory Visit
Admission: RE | Admit: 2018-06-16 | Discharge: 2018-06-16 | Disposition: A | Discharge status: Home / Self Care | Payer: No Typology Code available for payment source | Source: Ambulatory Visit | Attending: Obstetrics and Gynecology | Admitting: Obstetrics and Gynecology

## 2018-06-16 ENCOUNTER — Encounter (HOSPITAL_COMMUNITY): Payer: Self-pay

## 2018-06-16 VITALS — BP 114/76 | Wt 181.6 lb

## 2018-06-16 DIAGNOSIS — Z1231 Encounter for screening mammogram for malignant neoplasm of breast: Secondary | ICD-10-CM

## 2018-06-16 DIAGNOSIS — Z1239 Encounter for other screening for malignant neoplasm of breast: Secondary | ICD-10-CM

## 2018-06-16 HISTORY — DX: Disorder of thyroid, unspecified: E07.9

## 2018-06-16 NOTE — Patient Instructions (Signed)
Explained breast self awareness with Lenor Derrick. Patient did not need a Pap smear today due to last Pap smear was 08/07/2015. Let her know [facility] will cover Pap smears every 3 years unless has a history of abnormal Pap smears. Referred patient to the [facility] for a screening mammogram. Appointment scheduled for Thursday, June 16, 2018 at 1620. Let patient know the Breast Center will follow up with her within the next couple weeks with results of mammogram by letter or phone. Lenor Derrick verbalized understanding.  [place], Kathaleen Maser, RN 3:29 PM

## 2018-06-16 NOTE — Progress Notes (Signed)
No complaints today.   Pap Smear: Pap smear not completed today. Last Pap smear was 08/07/2015 at [facility] and normal. Per patient has no history of an abnormal Pap smear. Last Pap smear result is in Epic.  Physical exam: Breasts Breasts symmetrical. No skin abnormalities bilateral breasts. No nipple retraction bilateral breasts. No nipple discharge bilateral breasts. No lymphadenopathy. No lumps palpated bilateral breasts. No complaints of pain or tenderness on exam. Referred patient to the [facility] for a screening mammogram. Appointment scheduled for Thursday, June 16, 2018 at 1620.        Pelvic/Bimanual No Pap smear completed today since last Pap smear was 08/07/2015. Pap smear not indicated per [facility] guidelines.   Smoking History: Patient has never smoked.  Patient Navigation: Patient education provided. Access to services provided for patient through [facility] program. Spanish interpreter provided.   Colorectal Cancer Screening: Per patient has never had a colonoscopy completed. No complaints today. FIT Test given to patient to complete and return to [facility].  Breast and Cervical Cancer Risk Assessment: Patient has no family history of breast cancer, known genetic mutations, or radiation treatment to the chest before age 65. Patient has no history of cervical dysplasia, immunocompromised, or DES exposure in-utero.  Risk Assessment    Risk Scores      06/16/2018   Last edited by: Lynnell Dike, LPN   5-year risk: 0.5 %   Lifetime risk: 4 %         Used Spanish interpreter Natale Lay from [place].

## 2018-06-20 ENCOUNTER — Emergency Department (HOSPITAL_COMMUNITY): Payer: Self-pay

## 2018-06-20 ENCOUNTER — Encounter (HOSPITAL_COMMUNITY): Payer: Self-pay | Admitting: Internal Medicine

## 2018-06-20 ENCOUNTER — Inpatient Hospital Stay (HOSPITAL_COMMUNITY)
Admission: EM | Admit: 2018-06-20 | Discharge: 2018-06-24 | DRG: 872 | Disposition: A | Discharge status: Home / Self Care | Payer: Self-pay | Attending: Internal Medicine | Admitting: Internal Medicine

## 2018-06-20 ENCOUNTER — Other Ambulatory Visit: Payer: Self-pay

## 2018-06-20 DIAGNOSIS — E876 Hypokalemia: Secondary | ICD-10-CM | POA: Diagnosis present

## 2018-06-20 DIAGNOSIS — Z794 Long term (current) use of insulin: Secondary | ICD-10-CM

## 2018-06-20 DIAGNOSIS — Z79899 Other long term (current) drug therapy: Secondary | ICD-10-CM

## 2018-06-20 DIAGNOSIS — R7881 Bacteremia: Secondary | ICD-10-CM | POA: Diagnosis present

## 2018-06-20 DIAGNOSIS — I2510 Atherosclerotic heart disease of native coronary artery without angina pectoris: Secondary | ICD-10-CM | POA: Diagnosis present

## 2018-06-20 DIAGNOSIS — Z89411 Acquired absence of right great toe: Secondary | ICD-10-CM

## 2018-06-20 DIAGNOSIS — Z6833 Body mass index (BMI) 33.0-33.9, adult: Secondary | ICD-10-CM

## 2018-06-20 DIAGNOSIS — N179 Acute kidney failure, unspecified: Secondary | ICD-10-CM | POA: Diagnosis present

## 2018-06-20 DIAGNOSIS — E860 Dehydration: Secondary | ICD-10-CM | POA: Diagnosis present

## 2018-06-20 DIAGNOSIS — A4151 Sepsis due to Escherichia coli [E. coli]: Principal | ICD-10-CM | POA: Diagnosis present

## 2018-06-20 DIAGNOSIS — E785 Hyperlipidemia, unspecified: Secondary | ICD-10-CM | POA: Diagnosis present

## 2018-06-20 DIAGNOSIS — Z7982 Long term (current) use of aspirin: Secondary | ICD-10-CM

## 2018-06-20 DIAGNOSIS — N390 Urinary tract infection, site not specified: Secondary | ICD-10-CM | POA: Diagnosis present

## 2018-06-20 DIAGNOSIS — E1165 Type 2 diabetes mellitus with hyperglycemia: Secondary | ICD-10-CM | POA: Diagnosis present

## 2018-06-20 DIAGNOSIS — A419 Sepsis, unspecified organism: Secondary | ICD-10-CM | POA: Diagnosis present

## 2018-06-20 DIAGNOSIS — I10 Essential (primary) hypertension: Secondary | ICD-10-CM | POA: Diagnosis present

## 2018-06-20 DIAGNOSIS — IMO0002 Reserved for concepts with insufficient information to code with codable children: Secondary | ICD-10-CM | POA: Diagnosis present

## 2018-06-20 DIAGNOSIS — I252 Old myocardial infarction: Secondary | ICD-10-CM

## 2018-06-20 DIAGNOSIS — R310 Gross hematuria: Secondary | ICD-10-CM | POA: Diagnosis present

## 2018-06-20 DIAGNOSIS — E669 Obesity, unspecified: Secondary | ICD-10-CM | POA: Diagnosis present

## 2018-06-20 DIAGNOSIS — D649 Anemia, unspecified: Secondary | ICD-10-CM | POA: Diagnosis present

## 2018-06-20 DIAGNOSIS — R739 Hyperglycemia, unspecified: Secondary | ICD-10-CM

## 2018-06-20 DIAGNOSIS — E0590 Thyrotoxicosis, unspecified without thyrotoxic crisis or storm: Secondary | ICD-10-CM | POA: Diagnosis present

## 2018-06-20 DIAGNOSIS — T465X6A Underdosing of other antihypertensive drugs, initial encounter: Secondary | ICD-10-CM | POA: Diagnosis present

## 2018-06-20 DIAGNOSIS — R7989 Other specified abnormal findings of blood chemistry: Secondary | ICD-10-CM

## 2018-06-20 DIAGNOSIS — N10 Acute tubulo-interstitial nephritis: Secondary | ICD-10-CM

## 2018-06-20 LAB — URINALYSIS, ROUTINE W REFLEX MICROSCOPIC
BILIRUBIN URINE: NEGATIVE
Ketones, ur: NEGATIVE mg/dL
NITRITE: POSITIVE — AB
PROTEIN: 100 mg/dL — AB
Specific Gravity, Urine: 1.017 (ref 1.005–1.030)
WBC, UA: >50 WBC/hpf — ABNORMAL HIGH (ref 0–5)
pH: 5 (ref 5.0–8.0)

## 2018-06-20 LAB — CBC
HCT: 39.9 % (ref 36.0–46.0)
Hemoglobin: 12.8 g/dL (ref 12.0–15.0)
MCH: 27.7 pg (ref 26.0–34.0)
MCHC: 32.1 g/dL (ref 30.0–36.0)
MCV: 86.4 fL (ref 78.0–100.0)
PLATELETS: 258 10*3/uL (ref 150–400)
RBC: 4.62 MIL/uL (ref 3.87–5.11)
RDW: 12.2 % (ref 11.5–15.5)
WBC: 5.1 10*3/uL (ref 4.0–10.5)

## 2018-06-20 LAB — BASIC METABOLIC PANEL
Anion gap: 11 (ref 5–15)
BUN: 36 mg/dL — ABNORMAL HIGH (ref 6–20)
CHLORIDE: 96 mmol/L — AB (ref 98–111)
CO2: 24 mmol/L (ref 22–32)
CREATININE: 1.68 mg/dL — AB (ref 0.44–1.00)
Calcium: 9.6 mg/dL (ref 8.9–10.3)
GFR calc non Af Amer: 34 mL/min — ABNORMAL LOW (ref >60)
GFR, EST AFRICAN AMERICAN: 39 mL/min — AB (ref >60)
Glucose, Bld: 609 mg/dL (ref 70–99)
Potassium: 5.2 mmol/L — ABNORMAL HIGH (ref 3.5–5.1)
Sodium: 131 mmol/L — ABNORMAL LOW (ref 135–145)

## 2018-06-20 LAB — I-STAT BETA HCG BLOOD, ED (MC, WL, AP ONLY): I-stat hCG, quantitative: <5 m[IU]/mL (ref <5)

## 2018-06-20 LAB — I-STAT CG4 LACTIC ACID, ED
LACTIC ACID, VENOUS: 2.38 mmol/L — AB (ref 0.5–1.9)
Lactic Acid, Venous: 3.66 mmol/L (ref 0.5–1.9)

## 2018-06-20 LAB — LACTIC ACID, PLASMA
Lactic Acid, Venous: 1.6 mmol/L (ref 0.5–1.9)
Lactic Acid, Venous: 3.4 mmol/L (ref 0.5–1.9)

## 2018-06-20 LAB — PROCALCITONIN: Procalcitonin: 39.43 ng/mL

## 2018-06-20 LAB — GLUCOSE, CAPILLARY
GLUCOSE-CAPILLARY: 354 mg/dL — AB (ref 70–99)
GLUCOSE-CAPILLARY: 220 mg/dL — AB (ref 70–99)

## 2018-06-20 MED ORDER — ACETAMINOPHEN 325 MG PO TABS: 650.0000 mg | ORAL_TABLET | Freq: Four times a day (QID) | ORAL | Status: DC | PRN

## 2018-06-20 MED ORDER — ASPIRIN EC 81 MG PO TBEC
81.0000 mg | DELAYED_RELEASE_TABLET | Freq: Every day | ORAL | Status: DC
  Administered 2018-06-21 – 2018-06-24 (×4): 81 mg via ORAL
  Filled 2018-06-20 (×4): qty 1

## 2018-06-20 MED ORDER — ONDANSETRON HCL 4 MG/2ML IJ SOLN
4.0000 mg | Freq: Once | INTRAMUSCULAR | Status: AC
  Administered 2018-06-20: 4 mg via INTRAVENOUS
  Filled 2018-06-20: qty 2

## 2018-06-20 MED ORDER — SODIUM CHLORIDE 0.9% FLUSH
3.0000 mL | Freq: Two times a day (BID) | INTRAVENOUS | Status: DC
  Administered 2018-06-21: 3 mL via INTRAVENOUS
  Administered 2018-06-22: 10 mL via INTRAVENOUS
  Administered 2018-06-22: 3 mL via INTRAVENOUS

## 2018-06-20 MED ORDER — LACTATED RINGERS IV SOLN
INTRAVENOUS | Status: DC
  Administered 2018-06-20 – 2018-06-23 (×4): via INTRAVENOUS

## 2018-06-20 MED ORDER — ASPIRIN 81 MG PO TBEC: 81.0000 mg | DELAYED_RELEASE_TABLET | Freq: Every day | ORAL | Status: DC

## 2018-06-20 MED ORDER — SODIUM CHLORIDE 0.9 % IV BOLUS (SEPSIS)
500.0000 mL | Freq: Once | INTRAVENOUS | Status: AC
  Administered 2018-06-20: 500 mL via INTRAVENOUS

## 2018-06-20 MED ORDER — ACETAMINOPHEN 650 MG RE SUPP: 650.0000 mg | Freq: Four times a day (QID) | RECTAL | Status: DC | PRN

## 2018-06-20 MED ORDER — SODIUM CHLORIDE 0.9 % IV SOLN
1.0000 g | INTRAVENOUS | Status: DC
  Administered 2018-06-20: 1 g via INTRAVENOUS
  Filled 2018-06-20 (×2): qty 10

## 2018-06-20 MED ORDER — INSULIN ASPART 100 UNIT/ML ~~LOC~~ SOLN
0.0000 [IU] | Freq: Every day | SUBCUTANEOUS | Status: DC
  Administered 2018-06-20: 2 [IU] via SUBCUTANEOUS
  Administered 2018-06-23: 4 [IU] via SUBCUTANEOUS

## 2018-06-20 MED ORDER — GABAPENTIN 300 MG PO CAPS
1200.0000 mg | ORAL_CAPSULE | Freq: Three times a day (TID) | ORAL | Status: DC
  Administered 2018-06-20 – 2018-06-24 (×11): 1200 mg via ORAL
  Filled 2018-06-20 (×12): qty 4

## 2018-06-20 MED ORDER — SODIUM CHLORIDE 0.9 % IV BOLUS
1000.0000 mL | Freq: Once | INTRAVENOUS | Status: AC
  Administered 2018-06-20: 1000 mL via INTRAVENOUS

## 2018-06-20 MED ORDER — INSULIN ASPART 100 UNIT/ML ~~LOC~~ SOLN
0.0000 [IU] | Freq: Three times a day (TID) | SUBCUTANEOUS | Status: DC
  Administered 2018-06-20: 15 [IU] via SUBCUTANEOUS
  Administered 2018-06-21 (×2): 3 [IU] via SUBCUTANEOUS
  Administered 2018-06-21: 5 [IU] via SUBCUTANEOUS
  Administered 2018-06-22: 3 [IU] via SUBCUTANEOUS
  Administered 2018-06-22: 5 [IU] via SUBCUTANEOUS
  Administered 2018-06-23: 3 [IU] via SUBCUTANEOUS
  Administered 2018-06-23: 5 [IU] via SUBCUTANEOUS

## 2018-06-20 MED ORDER — HYDROMORPHONE HCL 1 MG/ML IJ SOLN
0.5000 mg | Freq: Once | INTRAMUSCULAR | Status: AC
  Administered 2018-06-20: 0.5 mg via INTRAVENOUS
  Filled 2018-06-20: qty 1

## 2018-06-20 MED ORDER — DOCUSATE SODIUM 100 MG PO CAPS
100.0000 mg | ORAL_CAPSULE | Freq: Two times a day (BID) | ORAL | Status: DC
  Administered 2018-06-20 – 2018-06-24 (×8): 100 mg via ORAL
  Filled 2018-06-20 (×9): qty 1

## 2018-06-20 MED ORDER — SODIUM CHLORIDE 0.9 % IV BOLUS (SEPSIS)
1000.0000 mL | Freq: Once | INTRAVENOUS | Status: AC
  Administered 2018-06-20: 1000 mL via INTRAVENOUS

## 2018-06-20 MED ORDER — FENTANYL CITRATE (PF) 100 MCG/2ML IJ SOLN
50.0000 ug | INTRAMUSCULAR | Status: DC | PRN
  Administered 2018-06-20: 50 ug via INTRAVENOUS
  Filled 2018-06-20: qty 2

## 2018-06-20 MED ORDER — INSULIN ASPART 100 UNIT/ML ~~LOC~~ SOLN
14.0000 [IU] | Freq: Once | SUBCUTANEOUS | Status: AC
  Administered 2018-06-20: 14 [IU] via SUBCUTANEOUS
  Filled 2018-06-20: qty 1

## 2018-06-20 MED ORDER — METHIMAZOLE 5 MG PO TABS
5.0000 mg | ORAL_TABLET | Freq: Every day | ORAL | Status: DC
  Administered 2018-06-21 – 2018-06-24 (×4): 5 mg via ORAL
  Filled 2018-06-20 (×4): qty 1

## 2018-06-20 MED ORDER — ACETAMINOPHEN 500 MG PO TABS
1000.0000 mg | ORAL_TABLET | Freq: Once | ORAL | Status: AC
  Administered 2018-06-20: 1000 mg via ORAL
  Filled 2018-06-20: qty 2

## 2018-06-20 MED ORDER — ATORVASTATIN CALCIUM 20 MG PO TABS
20.0000 mg | ORAL_TABLET | Freq: Every day | ORAL | Status: DC
  Administered 2018-06-20 – 2018-06-23 (×4): 20 mg via ORAL
  Filled 2018-06-20 (×4): qty 1

## 2018-06-20 MED ORDER — ONDANSETRON HCL 4 MG/2ML IJ SOLN: 4.0000 mg | Freq: Four times a day (QID) | INTRAMUSCULAR | Status: DC | PRN

## 2018-06-20 MED ORDER — POLYETHYLENE GLYCOL 3350 17 G PO PACK: 17.0000 g | PACK | Freq: Every day | ORAL | Status: DC | PRN

## 2018-06-20 MED ORDER — ACETAMINOPHEN 325 MG PO TABS
650.0000 mg | ORAL_TABLET | Freq: Four times a day (QID) | ORAL | Status: DC | PRN
  Administered 2018-06-21 (×2): 650 mg via ORAL
  Filled 2018-06-20 (×2): qty 2

## 2018-06-20 MED ORDER — INSULIN GLARGINE 100 UNIT/ML ~~LOC~~ SOLN
60.0000 [IU] | Freq: Every day | SUBCUTANEOUS | Status: DC
  Administered 2018-06-20 – 2018-06-24 (×5): 60 [IU] via SUBCUTANEOUS
  Filled 2018-06-20 (×5): qty 0.6

## 2018-06-20 MED ORDER — ONDANSETRON HCL 4 MG PO TABS: 4.0000 mg | ORAL_TABLET | Freq: Four times a day (QID) | ORAL | Status: DC | PRN

## 2018-06-20 MED ORDER — MORPHINE SULFATE (PF) 2 MG/ML IV SOLN: 2.0000 mg | INTRAVENOUS | Status: DC | PRN

## 2018-06-20 MED ORDER — ENOXAPARIN SODIUM 40 MG/0.4ML ~~LOC~~ SOLN
40.0000 mg | SUBCUTANEOUS | Status: DC
  Administered 2018-06-20 – 2018-06-23 (×4): 40 mg via SUBCUTANEOUS
  Filled 2018-06-20 (×4): qty 0.4

## 2018-06-20 MED ORDER — SODIUM CHLORIDE 0.9 % IV SOLN
INTRAVENOUS | Status: DC | PRN
  Administered 2018-06-20: 250 mL via INTRAVENOUS

## 2018-06-20 NOTE — ED Provider Notes (Signed)
[facility] EMERGENCY DEPARTMENT Provider Note   CSN: [id number] Arrival date & time: 06/20/18  9371     History   Chief Complaint Chief Complaint  Patient presents with  . Flank Pain    HPI KESHONNA VALVO is a 52 y.o. female.  Patient c/o acute onset right flank pain last PM. Pain constant, dull, moderate-severe, non radiating, with associated nausea and vomiting. Emesis not bloody or bilious. No abd distension. +hematuria. No dysuria. No hx kidney stones or kidney infection. Fever to 102.   The history is provided by the patient and a relative. A language interpreter was used.  Flank Pain  Associated symptoms include abdominal pain. Pertinent negatives include no chest pain, no headaches and no shortness of breath.    Past Medical History:  Diagnosis Date  . Anemia   . Diabetes mellitus   . Dyslipidemia   . Hypertension   . Non-STEMI (non-ST elevated myocardial infarction) [facility])    Medical therapy for distal LAD dissection  . Thyroid disease     Patient Active Problem List   Diagnosis Date Noted  . Great toe amputation status, right (HCC) 08/05/2017  . Acute osteomyelitis of toe of right foot (HCC)   . Mild renal insufficiency 07/19/2017  . AKI (acute kidney injury) (HCC) 03/04/2016  . Essential hypertension 05/14/2015  . CAD- with distal LAD disease, ? 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 05/14/2015  . NSTEMI (non-ST elevated myocardial infarction) (HCC)   . Syncope 05/13/2015  . Hyperglycemia 10/25/2014  . Abrasion of thigh, left 10/25/2014  . DM (diabetes mellitus), type 2, uncontrolled (HCC)   . DM hyperosmolar coma, type 2 (HCC) 05/22/2012  . Hyperosmolar syndrome 05/21/2012    Past Surgical History:  Procedure Laterality Date  . AMPUTATION Right 07/21/2017   Procedure: RIGHT GREAT TOE AMPUTATION;  Surgeon: Nadara Mustard, MD;  Location: [facility] OR;  Service: Orthopedics;  Laterality: Right;  . CARDIAC CATHETERIZATION N/A 05/14/2015   Procedure: Left  Heart Cath and Coronary Angiography;  Surgeon: Marykay Lex, MD; dLAD 60%>>95% (small vessel, ?dissection), CFX & RCA systems no sig dz, EF & LVEDP nl        OB History    Gravida  4   Para      Term      Preterm      AB      Living  4     SAB      TAB      Ectopic      Multiple      Live Births  4            Home Medications    Prior to Admission medications   Medication Sig Start Date End Date Taking? Authorizing Provider  gabapentin (NEURONTIN) 600 MG tablet Take 2 tablets (1,200 mg total) by mouth 3 (three) times daily. 04/12/18  Yes Loletta Specter, PA-C  nitroGLYCERIN (NITROSTAT) 0.4 MG SL tablet Place 1 tablet (0.4 mg total) under the tongue every 5 (five) minutes as needed for chest pain. 04/12/18  Yes Loletta Specter, PA-C  acetaminophen (TYLENOL) 500 MG tablet Take 2 tablets (1,000 mg total) by mouth every 8 (eight) hours as needed. 05/11/18   Loletta Specter, PA-C  aspirin 81 MG EC tablet Take 1 tablet (81 mg total) by mouth daily. 04/12/18   Loletta Specter, PA-C  atorvastatin (LIPITOR) 20 MG tablet Take 1 tablet (20 mg total) by mouth at bedtime. 04/12/18   Loletta Specter,  PA-C  cyclobenzaprine (FLEXERIL) 5 MG tablet Take 1 tablet (5 mg total) by mouth at bedtime. 05/11/18   Loletta Specter, PA-C  Insulin Glargine (LANTUS SOLOSTAR) 100 UNIT/ML Solostar Pen Inject 60 Units into the skin daily. 04/12/18   Loletta Specter, PA-C  Insulin Pen Needle (PEN NEEDLES) 31G X 8 MM MISC Inject 1 each into the skin at bedtime. 04/12/18   Loletta Specter, PA-C  losartan (COZAAR) 100 MG tablet Take 1 tablet (100 mg total) by mouth daily. 05/11/18   Loletta Specter, PA-C  metFORMIN (GLUCOPHAGE XR) 500 MG 24 hr tablet Take 2 tablets (1,000 mg total) by mouth daily with breakfast. 04/12/18   Loletta Specter, PA-C  methimazole (TAPAZOLE) 5 MG tablet Take 1 tablet (5 mg total) by mouth daily. 04/13/18   Loletta Specter, PA-C  olmesartan (BENICAR) 20  MG tablet Take 20 mg by mouth daily.    [provider]    Family History Family History  Problem Relation Age of Onset  . Hypertension Father   . Hyperlipidemia Father   . Cancer Mother   . Diabetes Sister   . Diabetes Brother   . Diabetes Sister   . Diabetes Sister   . Diabetes Sister   . Diabetes Brother   . Diabetes Brother   . Diabetes Brother   . Diabetes Brother     Social History Social History   Tobacco Use  . Smoking status: Never Smoker  . Smokeless tobacco: Never Used  Substance Use Topics  . Alcohol use: No  . Drug use: No     Allergies   Patient has no known allergies.   Review of Systems Review of Systems  Constitutional: Positive for fever.  HENT: Negative for sore throat.   Eyes: Negative for redness.  Respiratory: Negative for cough and shortness of breath.   Cardiovascular: Negative for chest pain.  Gastrointestinal: Positive for abdominal pain and vomiting. Negative for diarrhea.  Endocrine: Negative for polyuria.  Genitourinary: Positive for flank pain and hematuria. Negative for dysuria.  Musculoskeletal: Negative for neck pain and neck stiffness.  Skin: Negative for rash.  Neurological: Negative for headaches.  Hematological: Does not bruise/bleed easily.  Psychiatric/Behavioral: Negative for confusion.     Physical Exam Updated Vital Signs BP (!) 155/99 (BP Location: Right Arm)   Pulse (!) 130   Temp (!) 102.7 F (39.3 C) (Oral)   Resp (!) 22   LMP  (LMP Unknown)   SpO2 96%   Physical Exam  Constitutional: She appears well-developed and well-nourished.  HENT:  Mouth/Throat: Oropharynx is clear and moist.  Eyes: Conjunctivae are normal. No scleral icterus.  Neck: Neck supple. No tracheal deviation present.  No stiffness or rigidity  Cardiovascular: Normal rate, regular rhythm, normal heart sounds and intact distal pulses. Exam reveals no gallop and no friction rub.  No murmur heard. Pulmonary/Chest: Effort normal  and breath sounds normal. No respiratory distress.  Abdominal: Soft. Normal appearance and bowel sounds are normal. She exhibits no distension and no mass. There is tenderness. There is no rebound and no guarding. No hernia.  Right abd tenderness.   Genitourinary:  Genitourinary Comments: Mild right cva tenderness.   Musculoskeletal: She exhibits no edema.  Neurological: She is alert.  Skin: Skin is warm and dry. No rash noted.  Psychiatric: She has a normal mood and affect.  Nursing note and vitals reviewed.    ED Treatments / Results  Labs (all labs ordered are listed, but  only abnormal results are displayed) Results for orders placed or performed during the hospital encounter of 06/20/18  Basic metabolic panel  Result Value Ref Range   Sodium 131 (L) 135 - 145 mmol/L   Potassium 5.2 (H) 3.5 - 5.1 mmol/L   Chloride 96 (L) 98 - 111 mmol/L   CO2 24 22 - 32 mmol/L   Glucose, Bld 609 (HH) 70 - 99 mg/dL   BUN 36 (H) 6 - 20 mg/dL   Creatinine, Ser 1.61 (H) 0.44 - 1.00 mg/dL   Calcium 9.6 8.9 - 09.6 mg/dL   GFR calc non Af Amer 34 (L) >60 mL/min   GFR calc Af Amer 39 (L) >60 mL/min   Anion gap 11 5 - 15  CBC  Result Value Ref Range   WBC 5.1 4.0 - 10.5 K/uL   RBC 4.62 3.87 - 5.11 MIL/uL   Hemoglobin 12.8 12.0 - 15.0 g/dL   HCT 04.5 40.9 - 81.1 %   MCV 86.4 78.0 - 100.0 fL   MCH 27.7 26.0 - 34.0 pg   MCHC 32.1 30.0 - 36.0 g/dL   RDW 91.4 78.2 - 95.6 %   Platelets 258 150 - 400 K/uL  I-Stat beta hCG blood, ED  Result Value Ref Range   I-stat hCG, quantitative <5.0 <5 mIU/mL   Comment 3          I-Stat CG4 Lactic Acid, ED  (not at  [facility])  Result Value Ref Range   Lactic Acid, Venous 3.66 (HH) 0.5 - 1.9 mmol/L   Comment NOTIFIED PHYSICIAN    Ms Digital Screening Tomo Bilateral  Result Date: 06/17/2018 CLINICAL DATA:  Screening. EXAM: DIGITAL SCREENING BILATERAL MAMMOGRAM WITH TOMO AND CAD COMPARISON:  Previous exam(s). ACR Breast Density Category c: The breast tissue is  heterogeneously dense, which may obscure small masses. FINDINGS: There are no findings suspicious for malignancy. Images were processed with CAD. IMPRESSION: No mammographic evidence of malignancy. A result letter of this screening mammogram will be mailed directly to the patient. RECOMMENDATION: Screening mammogram in one year. (Code:SM-B-01Y) BI-RADS CATEGORY  1: Negative. Electronically Signed   By: Edwin Cap M.D.   On: 06/17/2018 08:57   Ct Renal Stone Study  Result Date: 06/20/2018 CLINICAL DATA:  Right flank pain with nausea EXAM: CT ABDOMEN AND PELVIS WITHOUT CONTRAST TECHNIQUE: Multidetector CT imaging of the abdomen and pelvis was performed following the standard protocol without oral or IV contrast. COMPARISON:  September 22, 2004 FINDINGS: Lower chest: Lung bases are clear. Hepatobiliary: No focal liver lesions are appreciable. There is hepatic steatosis. Gallbladder wall is not appreciably thickened. There is no biliary duct dilatation. Pancreas: No pancreatic mass or inflammatory focus. Spleen: No splenic lesions are evident. Adrenals/Urinary Tract: Adrenals bilaterally appear unremarkable. Right kidney is diffusely edematous. There is mild perinephric stranding on the right. There is moderate hydronephrosis on the right. There is no hydronephrosis on the left. There is no evident renal mass on either side. There is no intrarenal calculus on either side. There is no appreciable ureteral calculus on either side. There are phleboliths on the right which are near but appear separate from the distal right ureter. Urinary bladder is midline with wall thickness within normal limits. Stomach/Bowel: There is moderate stool throughout the colon. There is no appreciable bowel wall or mesenteric thickening. There is no evident bowel obstruction. There is no free air or portal venous air. Vascular/Lymphatic: There is no abdominal aortic aneurysm. No vascular lesions are evident on this noncontrast  enhanced  study. There is no adenopathy in the abdomen or pelvis. Reproductive: Uterus is anteverted. There is no evident pelvic mass. Other: Appendix appears normal. There is no abscess or ascites in the abdomen or pelvis. There is a small ventral hernia containing only fat. Musculoskeletal: There is degenerative change in the lumbar spine. There are no blastic or lytic bone lesions. There is no intramuscular or abdominal wall lesion. IMPRESSION: 1. Moderate hydronephrosis and ureterectasis on the right. Right kidney is edematous with moderate perinephric stranding on the right. No intrarenal or ureteral calculus evident on the right. Question recent calculus passage to account for these findings. Pyelonephritis is a differential consideration given this appearance. No abscess involving the right kidney is evident on this study. 2. Hepatic steatosis. No focal liver lesions evident on this noncontrast enhanced study. 3. No evident bowel obstruction. No abscess in the abdomen or pelvis evident. Appendix appears normal. 4.  Small ventral hernia containing only fat. Electronically Signed   By: Bretta Bang III M.D.   On: 06/20/2018 13:18    EKG None  Radiology Ct Renal Stone Study  Result Date: 06/20/2018 CLINICAL DATA:  Right flank pain with nausea EXAM: CT ABDOMEN AND PELVIS WITHOUT CONTRAST TECHNIQUE: Multidetector CT imaging of the abdomen and pelvis was performed following the standard protocol without oral or IV contrast. COMPARISON:  September 22, 2004 FINDINGS: Lower chest: Lung bases are clear. Hepatobiliary: No focal liver lesions are appreciable. There is hepatic steatosis. Gallbladder wall is not appreciably thickened. There is no biliary duct dilatation. Pancreas: No pancreatic mass or inflammatory focus. Spleen: No splenic lesions are evident. Adrenals/Urinary Tract: Adrenals bilaterally appear unremarkable. Right kidney is diffusely edematous. There is mild perinephric stranding on the right. There is  moderate hydronephrosis on the right. There is no hydronephrosis on the left. There is no evident renal mass on either side. There is no intrarenal calculus on either side. There is no appreciable ureteral calculus on either side. There are phleboliths on the right which are near but appear separate from the distal right ureter. Urinary bladder is midline with wall thickness within normal limits. Stomach/Bowel: There is moderate stool throughout the colon. There is no appreciable bowel wall or mesenteric thickening. There is no evident bowel obstruction. There is no free air or portal venous air. Vascular/Lymphatic: There is no abdominal aortic aneurysm. No vascular lesions are evident on this noncontrast enhanced study. There is no adenopathy in the abdomen or pelvis. Reproductive: Uterus is anteverted. There is no evident pelvic mass. Other: Appendix appears normal. There is no abscess or ascites in the abdomen or pelvis. There is a small ventral hernia containing only fat. Musculoskeletal: There is degenerative change in the lumbar spine. There are no blastic or lytic bone lesions. There is no intramuscular or abdominal wall lesion. IMPRESSION: 1. Moderate hydronephrosis and ureterectasis on the right. Right kidney is edematous with moderate perinephric stranding on the right. No intrarenal or ureteral calculus evident on the right. Question recent calculus passage to account for these findings. Pyelonephritis is a differential consideration given this appearance. No abscess involving the right kidney is evident on this study. 2. Hepatic steatosis. No focal liver lesions evident on this noncontrast enhanced study. 3. No evident bowel obstruction. No abscess in the abdomen or pelvis evident. Appendix appears normal. 4.  Small ventral hernia containing only fat. Electronically Signed   By: Bretta Bang III M.D.   On: 06/20/2018 13:18    Procedures Procedures (including critical care time)  Medications  Ordered in ED Medications  fentaNYL (SUBLIMAZE) injection 50 mcg (50 mcg Intravenous Given 06/20/18 1013)  cefTRIAXone (ROCEPHIN) 1 g in sodium chloride 0.9 % 100 mL IVPB (has no administration in time range)  sodium chloride 0.9 % bolus 1,000 mL (has no administration in time range)  HYDROmorphone (DILAUDID) injection 0.5 mg (has no administration in time range)  ondansetron (ZOFRAN) injection 4 mg (has no administration in time range)  ondansetron (ZOFRAN) injection 4 mg (4 mg Intravenous Given 06/20/18 1010)     Initial Impression / Assessment and Plan / ED Course  I have reviewed the triage vital signs and the nursing notes.  Pertinent labs & imaging results that were available during my care of the patient were reviewed by me and considered in my medical decision making (see chart for details).  Iv ns bolus. Labs. Cultures.   Ct imaging.  Reviewed nursing notes and prior charts for additional history.   Dilaudid .5 mg iv. zofran iv.   Rocephin iv.   Labs reviewed-  Glucose very high. hc03 normal. novolog sq.  Additional iv ns bolus - 30 cc/kg.   bp normal on recheck.  Ct reviewed - changes c/w right pyelo - no ureteral stone noted.   Medical service consulted for admission.   Repeat lactate pending.  CRITICAL CARE  RE: severe right flank pain, suspected sepsis/elevated lactate, hyperglycemia, aki.  Performed by: Suzi Roots Total critical care time: 40 minutes Critical care time was exclusive of separately billable procedures and treating other patients. Critical care was necessary to treat or prevent imminent or life-threatening deterioration. Critical care was time spent personally by me on the following activities: development of treatment plan with patient and/or surrogate as well as nursing, discussions with consultants, evaluation of patient's response to treatment, examination of patient, obtaining history from patient or surrogate, ordering and performing  treatments and interventions, ordering and review of laboratory studies, ordering and review of radiographic studies, pulse oximetry and re-evaluation of patient's condition.   Final Clinical Impressions(s) / ED Diagnoses   Final diagnoses:  None    ED Discharge Orders    None       Cathren Laine, MD 06/20/18 1357

## 2018-06-20 NOTE — H&P (Signed)
History and Physical    Rebecca [name] ZOX:[id number] DOB: [birth year] DOA: 06/20/2018  PCP: Loletta Specter, PA-C Consultants:  None Patient coming from:  Home - lives with son; NOK: Daughter, son - [phone]  Chief Complaint: flank pain  HPI: Rebecca [name] is a 52 y.o. female with medical history significant of hyperthyroidism; CAD (distal LAD dissection, medical therapy only); HTN; HLD; and DM presenting with flank pain.   Las night, she noticed pain in her right flank.  She noticed urinary urgency with hesitation.  She awoke with frank hematuria.  She had chills and sweats at home this AM but did not check her temperature.  She has never had a UTI before.  She is not sexually active.  She has ongoing mild but improved suprapubic pain and right flank pain.   ED Course: Severe R flank pain, T 102.7, tachycardia.  BP ok.  Lactate 3.6.  Glucose 600, bicarb normal. +AKI.  Awaiting UA results.  CT with pyelo, no abscess, no stone.  Review of Systems: As per HPI; otherwise review of systems reviewed and negative.   Ambulatory Status:  Ambulates without assistance  Past Medical History:  Diagnosis Date  . Anemia   . Diabetes mellitus   . Dyslipidemia   . Hypertension   . Non-STEMI (non-ST elevated myocardial infarction) [facility])    Medical therapy for distal LAD dissection  . Thyroid disease     Past Surgical History:  Procedure Laterality Date  . AMPUTATION Right 07/21/2017   Procedure: RIGHT GREAT TOE AMPUTATION;  Surgeon: Nadara Mustard, MD;  Location: [facility] OR;  Service: Orthopedics;  Laterality: Right;  . CARDIAC CATHETERIZATION N/A 05/14/2015   Procedure: Left Heart Cath and Coronary Angiography;  Surgeon: Marykay Lex, MD; dLAD 60%>>95% (small vessel, ?dissection), CFX & RCA systems no sig dz, EF & LVEDP nl       Social History   Socioeconomic History  . Marital status: Married    Spouse name: Not on file  . Number of children: Not on file  . Years of education:  Not on file  . Highest education level: Not on file  Occupational History  . Occupation: unemployed  Social Needs  . Financial resource strain: Not on file  . Food insecurity:    Worry: Not on file    Inability: Not on file  . Transportation needs:    Medical: Not on file    Non-medical: Not on file  Tobacco Use  . Smoking status: Never Smoker  . Smokeless tobacco: Never Used  Substance and Sexual Activity  . Alcohol use: No  . Drug use: No  . Sexual activity: Yes  Lifestyle  . Physical activity:    Days per week: Not on file    Minutes per session: Not on file  . Stress: Not on file  Relationships  . Social connections:    Talks on phone: Not on file    Gets together: Not on file    Attends religious service: Not on file    Active member of club or organization: Not on file    Attends meetings of clubs or organizations: Not on file    Relationship status: Not on file  . Intimate partner violence:    Fear of current or ex partner: Not on file    Emotionally abused: Not on file    Physically abused: Not on file    Forced sexual activity: Not on file  Other Topics Concern  . Not  on file  Social History Narrative  . Not on file    No Known Allergies  Family History  Problem Relation Age of Onset  . Hypertension Father   . Hyperlipidemia Father   . Cancer Mother   . Diabetes Sister   . Diabetes Brother   . Diabetes Sister   . Diabetes Sister   . Diabetes Sister   . Diabetes Brother   . Diabetes Brother   . Diabetes Brother   . Diabetes Brother     Prior to Admission medications   Medication Sig Start Date End Date Taking? Authorizing Provider  acetaminophen (TYLENOL) 500 MG tablet Take 2 tablets (1,000 mg total) by mouth every 8 (eight) hours as needed. 05/11/18  Yes Loletta Specter, PA-C  aspirin 81 MG EC tablet Take 1 tablet (81 mg total) by mouth daily. 04/12/18  Yes Loletta Specter, PA-C  atorvastatin (LIPITOR) 20 MG tablet Take 1 tablet (20 mg  total) by mouth at bedtime. 04/12/18  Yes Loletta Specter, PA-C  gabapentin (NEURONTIN) 600 MG tablet Take 2 tablets (1,200 mg total) by mouth 3 (three) times daily. 04/12/18  Yes Loletta Specter, PA-C  Insulin Glargine (LANTUS SOLOSTAR) 100 UNIT/ML Solostar Pen Inject 60 Units into the skin daily. 04/12/18  Yes Loletta Specter, PA-C  losartan (COZAAR) 100 MG tablet Take 1 tablet (100 mg total) by mouth daily. 05/11/18  Yes Loletta Specter, PA-C  metFORMIN (GLUCOPHAGE XR) 500 MG 24 hr tablet Take 2 tablets (1,000 mg total) by mouth daily with breakfast. 04/12/18  Yes Loletta Specter, PA-C  methimazole (TAPAZOLE) 5 MG tablet Take 1 tablet (5 mg total) by mouth daily. 04/13/18  Yes Loletta Specter, PA-C  nitroGLYCERIN (NITROSTAT) 0.4 MG SL tablet Place 1 tablet (0.4 mg total) under the tongue every 5 (five) minutes as needed for chest pain. 04/12/18  Yes Loletta Specter, PA-C  olmesartan (BENICAR) 20 MG tablet Take 20 mg by mouth daily.   Yes [provider]  cyclobenzaprine (FLEXERIL) 5 MG tablet Take 1 tablet (5 mg total) by mouth at bedtime. Patient not taking: Reported on 06/20/2018 05/11/18   Loletta Specter, PA-C  Insulin Pen Needle (PEN NEEDLES) 31G X 8 MM MISC Inject 1 each into the skin at bedtime. 04/12/18   Loletta Specter, PA-C    Physical Exam: Vitals:   06/20/18 1345 06/20/18 1407 06/20/18 1408 06/20/18 1430  BP: (!) 153/82     Pulse: (!) 124 (!) 124 (!) 124   Resp: 12 19 16    Temp:    (!) 101.2 F (38.4 C)  TempSrc:    Oral  SpO2: 98% 100% 99%   Weight:      Height:         General:  Appears calm and comfortable and is NAD Eyes:  PERRL, EOMI, normal lids, iris ENT:  grossly normal hearing, lips & tongue, mmm Neck:  no LAD, masses or thyromegaly Cardiovascular:  RRR, no m/r/g. No LE edema.  Respiratory:   CTA bilaterally with no wheezes/rales/rhonchi.  Normal respiratory effort. Abdomen:  Soft, R-sided and suprapubic TTP, ND, NABS Back:   Right  CVAT Skin:  no rash or induration seen on limited exam Musculoskeletal:  grossly normal tone BUE/BLE, good ROM, no bony abnormality; s/p right great toe amputation Lower extremity:  No LE edema.  Limited foot exam with no ulcerations.  2+ distal pulses. Psychiatric:  grossly normal mood and affect, speech fluent and appropriate, AOx3  Neurologic:  CN 2-12 grossly intact, moves all extremities in coordinated fashion, sensation intact    Radiological Exams on Admission: Ct Renal Stone Study  Result Date: 06/20/2018 CLINICAL DATA:  Right flank pain with nausea EXAM: CT ABDOMEN AND PELVIS WITHOUT CONTRAST TECHNIQUE: Multidetector CT imaging of the abdomen and pelvis was performed following the standard protocol without oral or IV contrast. COMPARISON:  September 22, 2004 FINDINGS: Lower chest: Lung bases are clear. Hepatobiliary: No focal liver lesions are appreciable. There is hepatic steatosis. Gallbladder wall is not appreciably thickened. There is no biliary duct dilatation. Pancreas: No pancreatic mass or inflammatory focus. Spleen: No splenic lesions are evident. Adrenals/Urinary Tract: Adrenals bilaterally appear unremarkable. Right kidney is diffusely edematous. There is mild perinephric stranding on the right. There is moderate hydronephrosis on the right. There is no hydronephrosis on the left. There is no evident renal mass on either side. There is no intrarenal calculus on either side. There is no appreciable ureteral calculus on either side. There are phleboliths on the right which are near but appear separate from the distal right ureter. Urinary bladder is midline with wall thickness within normal limits. Stomach/Bowel: There is moderate stool throughout the colon. There is no appreciable bowel wall or mesenteric thickening. There is no evident bowel obstruction. There is no free air or portal venous air. Vascular/Lymphatic: There is no abdominal aortic aneurysm. No vascular lesions are evident on  this noncontrast enhanced study. There is no adenopathy in the abdomen or pelvis. Reproductive: Uterus is anteverted. There is no evident pelvic mass. Other: Appendix appears normal. There is no abscess or ascites in the abdomen or pelvis. There is a small ventral hernia containing only fat. Musculoskeletal: There is degenerative change in the lumbar spine. There are no blastic or lytic bone lesions. There is no intramuscular or abdominal wall lesion. IMPRESSION: 1. Moderate hydronephrosis and ureterectasis on the right. Right kidney is edematous with moderate perinephric stranding on the right. No intrarenal or ureteral calculus evident on the right. Question recent calculus passage to account for these findings. Pyelonephritis is a differential consideration given this appearance. No abscess involving the right kidney is evident on this study. 2. Hepatic steatosis. No focal liver lesions evident on this noncontrast enhanced study. 3. No evident bowel obstruction. No abscess in the abdomen or pelvis evident. Appendix appears normal. 4.  Small ventral hernia containing only fat. Electronically Signed   By: Bretta Bang III M.D.   On: 06/20/2018 13:18    EKG: not done   Labs on Admission: I have personally reviewed the available labs and imaging studies at the time of the admission.  Pertinent labs:   Na++ 131 K+ 5.2 Glucose 609 CO2 24 BUN 36/Creatinine 1.68/GFR 34; prior 25/1.13/56 on 04/12/18 Lactate 3.55, 2.38 Normal CBC HCG <5.0 A1c 9.9 on 7/23  Assessment/Plan Principal Problem:   Sepsis secondary to UTI [facility]) Active Problems:   DM (diabetes mellitus), type 2, uncontrolled (HCC)   Essential hypertension   CAD- with distal LAD disease, ? 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   AKI (acute kidney injury) (HCC)   Sepsis -SIRS criteria in this patient includes: Fever, tachycardia, tachypnea  -Patient has evidence of acute organ failure with elevated lactate  -While awaiting blood cultures, this  appears to be a preseptic condition. -Sepsis protocol initiated -Patient did not have initial lactate >4 or SBP <90/MAP <65 and did not receive the 30 cc/kg IVF bolus. -Suspected urinary source -Blood and urine cultures pending -Will place in observation status with  telemetry and continue to monitor -Treat with IV Rocephin for presumed urinary source -Will trend lactate to ensure improvement -Will order procalcitonin level.  Antibiotics would not be indicated for PCT <0.1 and probably should not be used for < 0.25.  >0.5 indicates infection and >>0.5 indicates more serious disease.  As the procalcitonin level normalizes, it will be reasonable to consider de-escalation of antibiotic coverage.  AKI -Likely due to prerenal failure secondary to dehydration in the setting of infection with continuation of ARB (prescribed 2, only taking 1?), poorly controlled DM, and use of Metformin. -IVF repletion -Follow up renal function by BMP -Avoid ACEI/ARB, NSAIDs, Metformin for now  Uncontrolled DM -Will check A1c - her last was 9.9 in July and indicates chronically poor control -hold Glucophage -Continue Lantus -She was just given short-acting SQ insulin, 14 units -Glucostabilizer could be considered, but she does not have evidence of acidosis or an elevated anion gap and so SQ insulin and IVF should be sufficient -Cover with moderate-scale SSI -Diabetic coordinator consult requested, as she needs improved long-term glycemic control  HTN -She appears to have been prescribed both Benicar and Cozaar -She does not appear to have been particularly compliant with either, but should not be taking 2 different ARBs simultaneously -Will d/c Benicar -Hold Cozaar  HLD -Continue Lipitor  Hyperthyroidism -Resume Tapazole -Her last TFTs were normal in 7/19, but she did appear to be taking Tapazole at that time  DVT prophylaxis: Lovenox  Code Status:  Full - confirmed with patient/family Family  Communication: Daughter present throughout evaluation and translated throughout Disposition Plan:  Home once clinically improved Consults called: None  Admission status: It is my clinical opinion that referral for OBSERVATION is reasonable and necessary in this patient based on the above information provided. The aforementioned taken together are felt to place the patient at high risk for further clinical deterioration. However it is anticipated that the patient may be medically stable for discharge from the hospital within 24 to 48 hours.    Jonah Blue MD [facility]  If note is complete, please contact covering daytime or nighttime physician. [email or web address] Password [email or web address]  06/20/2018, 2:51 PM

## 2018-06-20 NOTE — ED Notes (Signed)
Patient transported to CT 

## 2018-06-20 NOTE — Progress Notes (Signed)
New Admission Note:   Arrival Method: stretcher from ED Mental Orientation: spanish speaking alert and oriented x4  Telemetry: box: 13: ST Assessment: Completed Skin: Intact IV: left FA infusing   Pain: 0  Tubes: None Safety Measures: Safety Fall Prevention Plan has been discussed  Admission: complete 5 [place] Orientation: Patient has been orientated to the room, unit and staff.   Family: at bedside   Orders to be reviewed and implemented. Will continue to monitor the patient. Call light has been placed within reach and bed alarm has been activated.   Gladstone Pih, RN

## 2018-06-20 NOTE — ED Triage Notes (Signed)
Pt to ER for evaluation of acute onset right flank pain radiating into right groin onset last night with nausea and vomiting. Pt appears uncomfortable in triage.

## 2018-06-20 NOTE — ED Notes (Signed)
Phlebotomy at bedside drawing labs.

## 2018-06-21 DIAGNOSIS — N10 Acute tubulo-interstitial nephritis: Secondary | ICD-10-CM

## 2018-06-21 DIAGNOSIS — N179 Acute kidney failure, unspecified: Secondary | ICD-10-CM

## 2018-06-21 DIAGNOSIS — R7881 Bacteremia: Secondary | ICD-10-CM | POA: Diagnosis present

## 2018-06-21 DIAGNOSIS — E1165 Type 2 diabetes mellitus with hyperglycemia: Secondary | ICD-10-CM

## 2018-06-21 LAB — GLUCOSE, CAPILLARY
GLUCOSE-CAPILLARY: 154 mg/dL — AB (ref 70–99)
Glucose-Capillary: 170 mg/dL — ABNORMAL HIGH (ref 70–99)
Glucose-Capillary: 233 mg/dL — ABNORMAL HIGH (ref 70–99)
Glucose-Capillary: 172 mg/dL — ABNORMAL HIGH (ref 70–99)

## 2018-06-21 LAB — BLOOD CULTURE ID PANEL (REFLEXED)
Acinetobacter baumannii: NOT DETECTED
CANDIDA GLABRATA: NOT DETECTED
CANDIDA KRUSEI: NOT DETECTED
CANDIDA TROPICALIS: NOT DETECTED
CARBAPENEM RESISTANCE: NOT DETECTED
Candida albicans: NOT DETECTED
Candida parapsilosis: NOT DETECTED
ESCHERICHIA COLI: DETECTED — AB
Enterobacter cloacae complex: NOT DETECTED
Enterobacteriaceae species: DETECTED — AB
Enterococcus species: NOT DETECTED
Haemophilus influenzae: NOT DETECTED
Klebsiella oxytoca: NOT DETECTED
Klebsiella pneumoniae: NOT DETECTED
Listeria monocytogenes: NOT DETECTED
NEISSERIA MENINGITIDIS: NOT DETECTED
PROTEUS SPECIES: NOT DETECTED
Pseudomonas aeruginosa: NOT DETECTED
SERRATIA MARCESCENS: NOT DETECTED
STAPHYLOCOCCUS AUREUS BCID: NOT DETECTED
STAPHYLOCOCCUS SPECIES: NOT DETECTED
STREPTOCOCCUS AGALACTIAE: NOT DETECTED
STREPTOCOCCUS PNEUMONIAE: NOT DETECTED
Streptococcus pyogenes: NOT DETECTED
Streptococcus species: NOT DETECTED

## 2018-06-21 LAB — BASIC METABOLIC PANEL
Anion gap: 10 (ref 5–15)
BUN: 23 mg/dL — AB (ref 6–20)
CALCIUM: 8.4 mg/dL — AB (ref 8.9–10.3)
CO2: 22 mmol/L (ref 22–32)
CREATININE: 1.4 mg/dL — AB (ref 0.44–1.00)
Chloride: 106 mmol/L (ref 98–111)
GFR calc Af Amer: 49 mL/min — ABNORMAL LOW (ref >60)
GFR calc non Af Amer: 42 mL/min — ABNORMAL LOW (ref >60)
Glucose, Bld: 218 mg/dL — ABNORMAL HIGH (ref 70–99)
Potassium: 4.1 mmol/L (ref 3.5–5.1)
Sodium: 138 mmol/L (ref 135–145)

## 2018-06-21 LAB — LACTIC ACID, PLASMA
Lactic Acid, Venous: 2.7 mmol/L (ref 0.5–1.9)
Lactic Acid, Venous: 0.8 mmol/L (ref 0.5–1.9)

## 2018-06-21 LAB — HEMOGLOBIN A1C
HEMOGLOBIN A1C: 10.8 % — AB (ref 4.8–5.6)
MEAN PLASMA GLUCOSE: 263 mg/dL

## 2018-06-21 MED ORDER — ACETAMINOPHEN 325 MG PO TABS
325.0000 mg | ORAL_TABLET | Freq: Once | ORAL | Status: AC
  Administered 2018-06-21: 325 mg via ORAL
  Filled 2018-06-21: qty 1

## 2018-06-21 MED ORDER — TRAMADOL HCL 50 MG PO TABS: 50.0000 mg | ORAL_TABLET | Freq: Four times a day (QID) | ORAL | Status: DC | PRN

## 2018-06-21 MED ORDER — SODIUM CHLORIDE 0.9 % IV SOLN
2.0000 g | INTRAVENOUS | Status: DC
  Administered 2018-06-21 – 2018-06-24 (×4): 2 g via INTRAVENOUS
  Filled 2018-06-21 (×4): qty 20

## 2018-06-21 MED ORDER — SODIUM CHLORIDE 0.9 % IV BOLUS
1000.0000 mL | Freq: Once | INTRAVENOUS | Status: AC
  Administered 2018-06-21: 1000 mL via INTRAVENOUS

## 2018-06-21 NOTE — Progress Notes (Signed)
2000:Recieved report from outgoing RN of lactic acid 3.4. Text paged Rollen Sox. Orders received for 1000 ml bolus and repeat lactic acid.   0050: Patient shivering in bed. Temp 102.1.BP 148/89. Pulse 134. Text paged K. Schorr. Orders received for 1000 ml bolus. 0230: Temp 101.5. Lactic acid 2.7. Heart rate sustaining 130's Text paged NP. NP informed bolus is currently running.  Orders received for repeat lactic acid in the morning and monitor heart rate.

## 2018-06-21 NOTE — Progress Notes (Signed)
Progress Note    Rebecca [name]  ZOX:[id number] DOB: [birth year]  DOA: 06/20/2018 PCP: Loletta Specter, PA-C    Brief Narrative:   Medical records reviewed and are as summarized below:  Rebecca [name] is an 52 y.o. female with medical history significant of hyperthyroidism; CAD (distal LAD dissection, medical therapy only); HTN; HLD; and DM presenting with flank pain.   Las night, she noticed pain in her right flank.  She noticed urinary urgency with hesitation.  She awoke with frank hematuria.  She had chills and sweats at home this AM but did not check her temperature.  She has never had a UTI before.  She is not sexually active.  She has ongoing mild but improved suprapubic pain and right flank pain.   Assessment/Plan:   Principal Problem:   Sepsis secondary to UTI [facility]) Active Problems:   DM (diabetes mellitus), type 2, uncontrolled (HCC)   Essential hypertension   CAD- with distal LAD disease, ? 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   AKI (acute kidney injury) (HCC)  Sepsis due to gram negative bacteremia: source Urine -SIRS criteria in this patient includes: Fever, tachycardia, tachypnea  -Patient has evidence of acute organ failure with elevated lactate  -Blood and urine cultures pending final: prelim is ecoli -Will place in observation status with telemetry and continue to monitor -IV rocephin increased to 2mg  -pro calcitonin elevated  AKI -Likely due to prerenal failure secondary to dehydration in the setting of infection with continuation of ARB (prescribed 2, only taking 1?), poorly controlled DM, and use of Metformin. -IVF -Follow up renal function by BMP -Avoid ACEI/ARB, NSAIDs, Metformin for now  Uncontrolled DM -HgbA1c: 10.8 -hold Glucophage -Continue Lantus -She was just given short-acting SQ insulin, 14 units -Cover with moderate-scale SSI -Diabetic coordinator consult requested, as she needs improved long-term glycemic control  HTN -She appears to have been  prescribed both Benicar and Cozaar -She does not appear to have been particularly compliant with either, but should not be taking 2 different ARBs simultaneously -Will d/c Benicar -Hold Cozaar  HLD -Continue Lipitor  Hyperthyroidism -Resume Tapazole -Her last TFTs were normal in 7/19, but she did appear to be taking Tapazole at that time  obesity Body mass index is 33.11 kg/m.  Patient has gram negative bacteremia and will be in hospital > 2 midnights for IV abx and IVF as well as blood sugar management as her elevated HgbA1c puts her at risk for complications   Family Communication/Anticipated D/C date and plan/Code Status   DVT prophylaxis: Lovenox ordered. Code Status: Full Code.  Family Communication:  Disposition Plan: pending improvement   Medical Consultants:    None.   Anti-Infectives:    None  Subjective:   No SOB, still with tenderness  Objective:    Vitals:   06/20/18 2059 06/21/18 0025 06/21/18 0150 06/21/18 0449  BP: 114/67 (!) 148/89  (!) 102/54  Pulse: (!) 105 (!) 134  (!) 105  Resp: 18 (!) 27  (!) 24  Temp: 98.2 F (36.8 C) (!) (P) 102.1 F (38.9 C) (!) 101.5 F (38.6 C) 98.7 F (37.1 C)  TempSrc: Oral Oral Oral Oral  SpO2: 98% 96%  95%  Weight:      Height:        Intake/Output Summary (Last 24 hours) at 06/21/2018 0732 Last data filed at 06/21/2018 0300 Gross per 24 hour  Intake 2648.7 ml  Output 1700 ml  Net 948.7 ml   Filed Weights   06/20/18 1226  Weight: 82.1 kg    Exam: In bed, resting comfortably +BS, mildly tender Min LE edema No increased work of breathing No rashes or lesions  (seen with a spanish interpreter)   Data Reviewed:   I have personally reviewed following labs and imaging studies:  Labs: Labs show the following:   Basic Metabolic Panel: Recent Labs  Lab 06/20/18 1025 06/21/18 0005  NA 131* 138  K 5.2* 4.1  CL 96* 106  CO2 24 22  GLUCOSE 609* 218*  BUN 36* 23*  CREATININE 1.68*  1.40*  CALCIUM 9.6 8.4*   GFR Estimated Creatinine Clearance: 46.7 mL/min (A) (by C-G formula based on SCr of 1.4 mg/dL (H)). Liver Function Tests: No results for input(s): AST, ALT, ALKPHOS, BILITOT, PROT, ALBUMIN in the last 168 hours. No results for input(s): LIPASE, AMYLASE in the last 168 hours. No results for input(s): AMMONIA in the last 168 hours. Coagulation profile No results for input(s): INR, PROTIME in the last 168 hours.  CBC: Recent Labs  Lab 06/20/18 1025  WBC 5.1  HGB 12.8  HCT 39.9  MCV 86.4  PLT 258   Cardiac Enzymes: No results for input(s): CKTOTAL, CKMB, CKMBINDEX, TROPONINI in the last 168 hours. BNP (last 3 results) No results for input(s): PROBNP in the last 8760 hours. CBG: Recent Labs  Lab 06/20/18 1608 06/20/18 2137  GLUCAP 354* 220*   D-Dimer: No results for input(s): DDIMER in the last 72 hours. Hgb A1c: Recent Labs    06/20/18 1527  HGBA1C 10.8*   Lipid Profile: No results for input(s): CHOL, HDL, LDLCALC, TRIG, CHOLHDL, LDLDIRECT in the last 72 hours. Thyroid function studies: No results for input(s): TSH, T4TOTAL, T3FREE, THYROIDAB in the last 72 hours.  Invalid input(s): FREET3 Anemia work up: No results for input(s): VITAMINB12, FOLATE, FERRITIN, TIBC, IRON, RETICCTPCT in the last 72 hours. Sepsis Labs: Recent Labs  Lab 06/20/18 1025  06/20/18 1527 06/20/18 1721 06/21/18 0005 06/21/18 0543  PROCALCITON  --   --  39.43  --   --   --   WBC 5.1  --   --   --   --   --   LATICACIDVEN  --    < > 1.6 3.4* 2.7* 0.8   < > = values in this interval not displayed.    Microbiology Recent Results (from the past 240 hour(s))  Blood Culture (routine x 2)     Status: None (Preliminary result)   Collection Time: 06/20/18 12:11 PM  Result Value Ref Range Status   Specimen Description BLOOD BLOOD LEFT WRIST  Final   Special Requests   Final    BOTTLES DRAWN AEROBIC ONLY Blood Culture adequate volume   Culture  Setup Time   Final      GRAM NEGATIVE RODS AEROBIC BOTTLE ONLY CRITICAL RESULT CALLED TO, READ BACK BY AND VERIFIED WITHShela Commons [facility] PHARMD [id number] 06/21/18 A BROWNING    Culture   Final    NO GROWTH < 24 HOURS Performed at [facility], [address] [place], [place] [address]    Report Status PENDING  Incomplete  Blood Culture (routine x 2)     Status: None (Preliminary result)   Collection Time: 06/20/18 12:23 PM  Result Value Ref Range Status   Specimen Description BLOOD BLOOD RIGHT HAND  Final   Special Requests   Final    BOTTLES DRAWN AEROBIC AND ANAEROBIC Blood Culture adequate volume   Culture  Setup Time   Final  IN BOTH AEROBIC AND ANAEROBIC BOTTLES GRAM NEGATIVE RODS Organism ID to follow CRITICAL RESULT CALLED TO, READ BACK BY AND VERIFIED WITHShela Commons [facility] PHARMD [id number] 06/21/18 A BROWNING    Culture   Final    NO GROWTH < 24 HOURS Performed at [facility], [address], [place], [place] [address]    Report Status PENDING  Incomplete  Blood Culture ID Panel (Reflexed)     Status: Abnormal   Collection Time: 06/20/18 12:23 PM  Result Value Ref Range Status   Enterococcus species NOT DETECTED NOT DETECTED Final   Listeria monocytogenes NOT DETECTED NOT DETECTED Final   Staphylococcus species NOT DETECTED NOT DETECTED Final   Staphylococcus aureus NOT DETECTED NOT DETECTED Final   Streptococcus species NOT DETECTED NOT DETECTED Final   Streptococcus agalactiae NOT DETECTED NOT DETECTED Final   Streptococcus pneumoniae NOT DETECTED NOT DETECTED Final   Streptococcus pyogenes NOT DETECTED NOT DETECTED Final   Acinetobacter baumannii NOT DETECTED NOT DETECTED Final   Enterobacteriaceae species DETECTED (A) NOT DETECTED Final    Comment: Enterobacteriaceae represent a large family of gram-negative bacteria, not a single organism. CRITICAL RESULT CALLED TO, READ BACK BY AND VERIFIED WITH: J [facility] PHARMD [id number] 06/21/18 A BROWNING    Enterobacter cloacae complex NOT DETECTED NOT  DETECTED Final   Escherichia coli DETECTED (A) NOT DETECTED Final    Comment: CRITICAL RESULT CALLED TO, READ BACK BY AND VERIFIED WITH: J [facility] PHARMD [id number] 06/21/18 A BROWNING    Klebsiella oxytoca NOT DETECTED NOT DETECTED Final   Klebsiella pneumoniae NOT DETECTED NOT DETECTED Final   Proteus species NOT DETECTED NOT DETECTED Final   Serratia marcescens NOT DETECTED NOT DETECTED Final   Carbapenem resistance NOT DETECTED NOT DETECTED Final   Haemophilus influenzae NOT DETECTED NOT DETECTED Final   Neisseria meningitidis NOT DETECTED NOT DETECTED Final   Pseudomonas aeruginosa NOT DETECTED NOT DETECTED Final   Candida albicans NOT DETECTED NOT DETECTED Final   Candida glabrata NOT DETECTED NOT DETECTED Final   Candida krusei NOT DETECTED NOT DETECTED Final   Candida parapsilosis NOT DETECTED NOT DETECTED Final   Candida tropicalis NOT DETECTED NOT DETECTED Final    Comment: Performed at [facility], [address] [place], [place] [address]    Procedures and diagnostic studies:  Ct Renal Stone Study  Result Date: 06/20/2018 CLINICAL DATA:  Right flank pain with nausea EXAM: CT ABDOMEN AND PELVIS WITHOUT CONTRAST TECHNIQUE: Multidetector CT imaging of the abdomen and pelvis was performed following the standard protocol without oral or IV contrast. COMPARISON:  September 22, 2004 FINDINGS: Lower chest: Lung bases are clear. Hepatobiliary: No focal liver lesions are appreciable. There is hepatic steatosis. Gallbladder wall is not appreciably thickened. There is no biliary duct dilatation. Pancreas: No pancreatic mass or inflammatory focus. Spleen: No splenic lesions are evident. Adrenals/Urinary Tract: Adrenals bilaterally appear unremarkable. Right kidney is diffusely edematous. There is mild perinephric stranding on the right. There is moderate hydronephrosis on the right. There is no hydronephrosis on the left. There is no evident renal mass on either side. There is no intrarenal  calculus on either side. There is no appreciable ureteral calculus on either side. There are phleboliths on the right which are near but appear separate from the distal right ureter. Urinary bladder is midline with wall thickness within normal limits. Stomach/Bowel: There is moderate stool throughout the colon. There is no appreciable bowel wall or mesenteric thickening. There is no evident bowel obstruction.  There is no free air or portal venous air. Vascular/Lymphatic: There is no abdominal aortic aneurysm. No vascular lesions are evident on this noncontrast enhanced study. There is no adenopathy in the abdomen or pelvis. Reproductive: Uterus is anteverted. There is no evident pelvic mass. Other: Appendix appears normal. There is no abscess or ascites in the abdomen or pelvis. There is a small ventral hernia containing only fat. Musculoskeletal: There is degenerative change in the lumbar spine. There are no blastic or lytic bone lesions. There is no intramuscular or abdominal wall lesion. IMPRESSION: 1. Moderate hydronephrosis and ureterectasis on the right. Right kidney is edematous with moderate perinephric stranding on the right. No intrarenal or ureteral calculus evident on the right. Question recent calculus passage to account for these findings. Pyelonephritis is a differential consideration given this appearance. No abscess involving the right kidney is evident on this study. 2. Hepatic steatosis. No focal liver lesions evident on this noncontrast enhanced study. 3. No evident bowel obstruction. No abscess in the abdomen or pelvis evident. Appendix appears normal. 4.  Small ventral hernia containing only fat. Electronically Signed   By: Bretta Bang III M.D.   On: 06/20/2018 13:18    Medications:   . aspirin EC  81 mg Oral Daily  . atorvastatin  20 mg Oral QHS  . docusate sodium  100 mg Oral BID  . enoxaparin (LOVENOX) injection  40 mg Subcutaneous Q24H  . gabapentin  1,200 mg Oral TID  .  insulin aspart  0-15 Units Subcutaneous TID WC  . insulin aspart  0-5 Units Subcutaneous QHS  . insulin glargine  60 Units Subcutaneous Daily  . methimazole  5 mg Oral Daily  . sodium chloride flush  3 mL Intravenous Q12H   Continuous Infusions: . sodium chloride 250 mL (06/20/18 1230)  . cefTRIAXone (ROCEPHIN)  IV 2 g (06/21/18 0124)  . lactated ringers 100 mL/hr at 06/21/18 0207     LOS: 0 days   Joseph Art  [facility]   *Please refer to [email or web address], password [email or web address] to get updated schedule on who will round on this patient, as hospitalists switch teams weekly. If 7PM-7AM, please contact night-coverage at [email or web address], password [email or web address] for any overnight needs.  06/21/2018, 7:32 AM

## 2018-06-21 NOTE — Progress Notes (Signed)
PHARMACY - PHYSICIAN COMMUNICATION CRITICAL VALUE ALERT - BLOOD CULTURE IDENTIFICATION (BCID)  Rebecca [name] is an 52 y.o. female who presented to [facility] on 06/20/2018 with a chief complaint of sepsis, likely UTI  Name of physician (or Provider) Contacted: K Schorr ([facility])  Current antibiotics: Ceftriaxone 1g IV q24h  Changes to prescribed antibiotics recommended:  Increase Ceftriaxone to 2g IV q24h  Results for orders placed or performed during the hospital encounter of 06/20/18  Blood Culture ID Panel (Reflexed) (Collected: 06/20/2018 12:23 PM)  Result Value Ref Range   Enterococcus species NOT DETECTED NOT DETECTED   Listeria monocytogenes NOT DETECTED NOT DETECTED   Staphylococcus species NOT DETECTED NOT DETECTED   Staphylococcus aureus NOT DETECTED NOT DETECTED   Streptococcus species NOT DETECTED NOT DETECTED   Streptococcus agalactiae NOT DETECTED NOT DETECTED   Streptococcus pneumoniae NOT DETECTED NOT DETECTED   Streptococcus pyogenes NOT DETECTED NOT DETECTED   Acinetobacter baumannii NOT DETECTED NOT DETECTED   Enterobacteriaceae species DETECTED (A) NOT DETECTED   Enterobacter cloacae complex NOT DETECTED NOT DETECTED   Escherichia coli DETECTED (A) NOT DETECTED   Klebsiella oxytoca NOT DETECTED NOT DETECTED   Klebsiella pneumoniae NOT DETECTED NOT DETECTED   Proteus species NOT DETECTED NOT DETECTED   Serratia marcescens NOT DETECTED NOT DETECTED   Carbapenem resistance NOT DETECTED NOT DETECTED   Haemophilus influenzae NOT DETECTED NOT DETECTED   Neisseria meningitidis NOT DETECTED NOT DETECTED   Pseudomonas aeruginosa NOT DETECTED NOT DETECTED   Candida albicans NOT DETECTED NOT DETECTED   Candida glabrata NOT DETECTED NOT DETECTED   Candida krusei NOT DETECTED NOT DETECTED   Candida parapsilosis NOT DETECTED NOT DETECTED   Candida tropicalis NOT DETECTED NOT DETECTED    Abran Duke 06/21/2018  12:53 AM

## 2018-06-22 LAB — BASIC METABOLIC PANEL
Anion gap: 5 (ref 5–15)
BUN: 12 mg/dL (ref 6–20)
CALCIUM: 8 mg/dL — AB (ref 8.9–10.3)
CO2: 24 mmol/L (ref 22–32)
CREATININE: 1.03 mg/dL — AB (ref 0.44–1.00)
Chloride: 108 mmol/L (ref 98–111)
GFR calc Af Amer: >60 mL/min (ref >60)
GFR calc non Af Amer: >60 mL/min (ref >60)
Glucose, Bld: 108 mg/dL — ABNORMAL HIGH (ref 70–99)
Potassium: 3.4 mmol/L — ABNORMAL LOW (ref 3.5–5.1)
SODIUM: 137 mmol/L (ref 135–145)

## 2018-06-22 LAB — CBC
HEMATOCRIT: 28.8 % — AB (ref 36.0–46.0)
HEMOGLOBIN: 9.4 g/dL — AB (ref 12.0–15.0)
MCH: 27.6 pg (ref 26.0–34.0)
MCHC: 32.6 g/dL (ref 30.0–36.0)
MCV: 84.5 fL (ref 78.0–100.0)
PLATELETS: 224 10*3/uL (ref 150–400)
RBC: 3.41 MIL/uL — AB (ref 3.87–5.11)
RDW: 12.7 % (ref 11.5–15.5)
WBC: 12.2 10*3/uL — AB (ref 4.0–10.5)

## 2018-06-22 LAB — URINE CULTURE

## 2018-06-22 LAB — GLUCOSE, CAPILLARY
GLUCOSE-CAPILLARY: 155 mg/dL — AB (ref 70–99)
Glucose-Capillary: 87 mg/dL (ref 70–99)
Glucose-Capillary: 201 mg/dL — ABNORMAL HIGH (ref 70–99)
Glucose-Capillary: 171 mg/dL — ABNORMAL HIGH (ref 70–99)

## 2018-06-22 LAB — TSH: TSH: 2.453 u[IU]/mL (ref 0.350–4.500)

## 2018-06-22 MED ORDER — LIVING WELL WITH DIABETES BOOK - IN SPANISH
Freq: Once | Status: AC
  Administered 2018-06-22: 13:00:00
  Filled 2018-06-22: qty 1

## 2018-06-22 NOTE — Care Management Note (Signed)
Case Management Note  Patient Details  Name: HAVANNAH STREAT MRN: [id number] Date of Birth: 11-15-1965  Subjective/Objective:                 UTI   Action/Plan:  Spoke w patient at bedside with family. Patient goes to [facility], and gets meds filled at [facility]. Family identified no barriers.   Plan for patient to fill meds at [facility] unless discharged over weekend and will need MATCH.  Expected Discharge Date:                  Expected Discharge Plan:  Home/Self Care  In-House Referral:     Discharge planning Services  CM Consult  Post Acute Care Choice:    Choice offered to:     DME Arranged:    DME Agency:     HH Arranged:    HH Agency:     Status of Service:  Completed, signed off  If discussed at [employer] of Stay Meetings, dates discussed:    Additional Comments:  Lawerance Sabal, RN 06/22/2018, 10:34 AM

## 2018-06-22 NOTE — Progress Notes (Signed)
Inpatient Diabetes Program Recommendations  AACE/ADA: New Consensus Statement on Inpatient Glycemic Control (2015)  Target Ranges:  Prepandial:   less than 140 mg/dL      Peak postprandial:   less than 180 mg/dL (1-2 hours)      Critically ill patients:  140 - 180 mg/dL   Lab Results  Component Value Date   GLUCAP 201 (H) 06/22/2018   HGBA1C 10.8 (H) 06/20/2018    Review of Glycemic ControlResults for Rebecca [name], Rebecca [name] (MRN [id number]) as of 06/22/2018 11:37  Ref. Range 06/21/2018 11:20 06/21/2018 16:36 06/21/2018 21:56 06/22/2018 07:31 06/22/2018 11:19  Glucose-Capillary Latest Ref Range: 70 - 99 mg/dL 409 (H) 811 (H) 914 (H) 87 201 (H)    Diabetes history: Type 2 DM  Outpatient Diabetes medications:  Current orders for Inpatient glycemic control:  Lantus 60 units daily, Novolog moderate tid with meals and HS  Inpatient Diabetes Program Recommendations:    Referral received. Spoke to patient and daughter in room (daughter translated).  Per patient she was recently changed from [employer] 70/30 insulin to Lantus.  We discussed that Lantus insulin will not cover food and blood sugars may be going up during the day.  She usually only checks blood sugars once a day in the mornings.  We discussed A1C results of 10.8%.  Patient states that this is an improvement from last time (>12%).  Discussed importance of checking blood sugars at least 2 times a day and recording for MD.  We also discussed low blood sugar signs and symptoms.  She states that she knows that her blood sugars are low when she gets "sweaty".  Discussed treatment strategies for treating low blood sugars including 1/2 cup of juice/regular soda. Patient states that she usually takes 2 glucose tablets.  I recommended that she increase to 3-4 glucose tablets. Patient plans to f/u at [facility].Discussed importance of glycemic control for prevention of complications.  Will order Spanish booklet-Living Well with DM.  May need addition  of Novolog meal coverage to prevent post-prandial rises in blood sugars.  Consider adding Novolog 4 units tid with meals if post-prandial blood sugars increased.   Thanks,  Beryl Meager, RN, BC-ADM Inpatient Diabetes Coordinator Pager [phone] (8a-5p)

## 2018-06-22 NOTE — Plan of Care (Signed)

## 2018-06-22 NOTE — Progress Notes (Signed)
PROGRESS NOTE  Rebecca [name] ZOX:[id number] DOB: 20-Jan-1966 DOA: 06/20/2018 PCP: Loletta Specter, PA-C  HPI/Recap of past 24 hours:  Rebecca [name] is an 52 y.o. female with medical history significant ofhyperthyroidism; CAD (distal LAD dissection, medical therapy only); HTN; HLD; and DM presenting with flank pain.Las night, she noticed pain in her right flank. She noticed urinary urgency with hesitation. She awoke with frank hematuria. She had chills and sweats at home this AM but did not check her temperature. She has never had a UTI before. She is not sexually active. She has ongoing mild but improved suprapubic pain and right flank pain.  06/22/2018: Patient seen and examined with her daughter, son-in-law and grandson at bedside.  Reports her symptoms are improving.  On IV Rocephin for E. coli bacteremia.  Assessment/Plan: Principal Problem:   Sepsis secondary to UTI [facility]) Active Problems:   DM (diabetes mellitus), type 2, uncontrolled (HCC)   Essential hypertension   CAD- with distal LAD disease, ? 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 coli bacteremia Suspect from urosepsis Sepsis physiology is resolving Leukocytosis with WBC 12.2 and elevated procalcitonin Continue IV Rocephin Switch to Keflex tomorrow Blood cultures positive for E. coli resistant to Unasyn Monitor fever curve and WBC Obtain CBC in the morning  Urosepsis Management as stated above  Hypokalemia Potassium 3.4 Repleted with p.o. potassium  Chronic normocytic anemia No sign of overt bleeding at this time Obtain iron studies Repeat CBC in the morning   Code Status: Full code  Family Communication: Daughter and son-in-law at bedside  Disposition Plan: Home when clinically stable possibly tomorrow 06/23/2018   Consultants:  None  Procedures:  None  Antimicrobials:  Rocephin  DVT prophylaxis: Subcu Lovenox  daily   Objective: Vitals:   06/21/18 1707 06/21/18 2157 06/22/18 0535 06/22/18 1043  BP: 140/74 (!) 163/84 136/71 (!) 154/66  Pulse: 96 (!) 109 97 93  Resp: 18 (!) 22 18 18   Temp: 98.9 F (37.2 C) 100 F (37.8 C) 99.2 F (37.3 C) 99.1 F (37.3 C)  TempSrc: Oral Oral Oral Oral  SpO2: 98% 97% 97% 99%  Weight:  85.3 kg    Height:        Intake/Output Summary (Last 24 hours) at 06/22/2018 1739 Last data filed at 06/22/2018 1300 Gross per 24 hour  Intake 2260 ml  Output 1400 ml  Net 860 ml   Filed Weights   06/20/18 1226 06/21/18 2157  Weight: 82.1 kg 85.3 kg    Exam:  . General: 52 y.o. year-old female well developed well nourished in no acute distress.  Alert and oriented x3. . Cardiovascular: Regular rate and rhythm with no rubs or gallops.  No thyromegaly or JVD noted.   Marland Kitchen Respiratory: Clear to auscultation with no wheezes or rales. Good inspiratory effort. . Abdomen: Soft nontender nondistended with normal bowel sounds x4 quadrants. . Musculoskeletal: No lower extremity edema. 2/4 pulses in all 4 extremities. . Skin: No ulcerative lesions noted or rashes, . Psychiatry: Mood is appropriate for condition and setting   Data Reviewed: CBC: Recent Labs  Lab 06/20/18 1025 06/22/18 0438  WBC 5.1 12.2*  HGB 12.8 9.4*  HCT 39.9 28.8*  MCV 86.4 84.5  PLT 258 224   Basic Metabolic Panel: Recent Labs  Lab 06/20/18 1025 06/21/18 0005 06/22/18 0438  NA 131* 138 137  K 5.2* 4.1 3.4*  CL 96* 106 108  CO2 24 22  24  GLUCOSE 609* 218* 108*  BUN 36* 23* 12  CREATININE 1.68* 1.40* 1.03*  CALCIUM 9.6 8.4* 8.0*   GFR: Estimated Creatinine Clearance: 64.8 mL/min (A) (by C-G formula based on SCr of 1.03 mg/dL (H)). Liver Function Tests: No results for input(s): AST, ALT, ALKPHOS, BILITOT, PROT, ALBUMIN in the last 168 hours. No results for input(s): LIPASE, AMYLASE in the last 168 hours. No results for input(s): AMMONIA in the last 168 hours. Coagulation Profile: No  results for input(s): INR, PROTIME in the last 168 hours. Cardiac Enzymes: No results for input(s): CKTOTAL, CKMB, CKMBINDEX, TROPONINI in the last 168 hours. BNP (last 3 results) No results for input(s): PROBNP in the last 8760 hours. HbA1C: Recent Labs    06/20/18 1527  HGBA1C 10.8*   CBG: Recent Labs  Lab 06/21/18 1636 06/21/18 2156 06/22/18 0731 06/22/18 1119 06/22/18 1704  GLUCAP 172* 154* 87 201* 171*   Lipid Profile: No results for input(s): CHOL, HDL, LDLCALC, TRIG, CHOLHDL, LDLDIRECT in the last 72 hours. Thyroid Function Tests: Recent Labs    06/22/18 0438  TSH 2.453   Anemia Panel: No results for input(s): VITAMINB12, FOLATE, FERRITIN, TIBC, IRON, RETICCTPCT in the last 72 hours. Urine analysis:    Component Value Date/Time   COLORURINE YELLOW 06/20/2018 1842   APPEARANCEUR HAZY (A) 06/20/2018 1842   LABSPEC 1.017 06/20/2018 1842   PHURINE 5.0 06/20/2018 1842   GLUCOSEU >=500 (A) 06/20/2018 1842   HGBUR LARGE (A) 06/20/2018 1842   BILIRUBINUR NEGATIVE 06/20/2018 1842   KETONESUR NEGATIVE 06/20/2018 1842   PROTEINUR 100 (A) 06/20/2018 1842   UROBILINOGEN 0.2 05/13/2015 0411   NITRITE POSITIVE (A) 06/20/2018 1842   LEUKOCYTESUR MODERATE (A) 06/20/2018 1842   Sepsis Labs: @LABRCNTIP (procalcitonin:4,lacticidven:4)  ) Recent Results (from the past 240 hour(s))  Blood Culture (routine x 2)     Status: Abnormal (Preliminary result)   Collection Time: 06/20/18 12:11 PM  Result Value Ref Range Status   Specimen Description BLOOD BLOOD LEFT WRIST  Final   Special Requests   Final    BOTTLES DRAWN AEROBIC ONLY Blood Culture adequate volume   Culture  Setup Time   Final    GRAM NEGATIVE RODS AEROBIC BOTTLE ONLY CRITICAL RESULT CALLED TO, READ BACK BY AND VERIFIED WITHShela Commons [facility] PHARMD [id number] 06/21/18 A BROWNING    Culture (A)  Final    ESCHERICHIA COLI SUSCEPTIBILITIES PERFORMED ON PREVIOUS CULTURE WITHIN THE LAST 5 DAYS. Performed at [facility]  Lab, [address], [place], [place] [address]    Report Status PENDING  Incomplete  Blood Culture (routine x 2)     Status: Abnormal (Preliminary result)   Collection Time: 06/20/18 12:23 PM  Result Value Ref Range Status   Specimen Description BLOOD BLOOD RIGHT HAND  Final   Special Requests   Final    BOTTLES DRAWN AEROBIC AND ANAEROBIC Blood Culture adequate volume   Culture  Setup Time   Final    IN BOTH AEROBIC AND ANAEROBIC BOTTLES GRAM NEGATIVE RODS CRITICAL RESULT CALLED TO, READ BACK BY AND VERIFIED WITHMelven Sartorius [facility] 1914 06/21/18 A BROWNING Performed at [facility] Lab, [address] [place], [place] [address]    Culture ESCHERICHIA COLI (A)  Final   Report Status PENDING  Incomplete   Organism ID, Bacteria ESCHERICHIA COLI  Final      Susceptibility   Escherichia coli - MIC*    AMPICILLIN >=32 RESISTANT Resistant     CEFAZOLIN 8 SENSITIVE  Sensitive     CEFEPIME <=1 SENSITIVE Sensitive     CEFTAZIDIME <=1 SENSITIVE Sensitive     CEFTRIAXONE <=1 SENSITIVE Sensitive     CIPROFLOXACIN <=0.25 SENSITIVE Sensitive     GENTAMICIN <=1 SENSITIVE Sensitive     IMIPENEM <=0.25 SENSITIVE Sensitive     TRIMETH/SULFA <=20 SENSITIVE Sensitive     AMPICILLIN/SULBACTAM >=32 RESISTANT Resistant     PIP/TAZO 8 SENSITIVE Sensitive     Extended ESBL NEGATIVE Sensitive     * ESCHERICHIA COLI  Blood Culture ID Panel (Reflexed)     Status: Abnormal   Collection Time: 06/20/18 12:23 PM  Result Value Ref Range Status   Enterococcus species NOT DETECTED NOT DETECTED Final   Listeria monocytogenes NOT DETECTED NOT DETECTED Final   Staphylococcus species NOT DETECTED NOT DETECTED Final   Staphylococcus aureus NOT DETECTED NOT DETECTED Final   Streptococcus species NOT DETECTED NOT DETECTED Final   Streptococcus agalactiae NOT DETECTED NOT DETECTED Final   Streptococcus pneumoniae NOT DETECTED NOT DETECTED Final   Streptococcus pyogenes NOT DETECTED NOT DETECTED Final   Acinetobacter  baumannii NOT DETECTED NOT DETECTED Final   Enterobacteriaceae species DETECTED (A) NOT DETECTED Final    Comment: Enterobacteriaceae represent a large family of gram-negative bacteria, not a single organism. CRITICAL RESULT CALLED TO, READ BACK BY AND VERIFIED WITH: J [facility] PHARMD [id number] 06/21/18 A BROWNING    Enterobacter cloacae complex NOT DETECTED NOT DETECTED Final   Escherichia coli DETECTED (A) NOT DETECTED Final    Comment: CRITICAL RESULT CALLED TO, READ BACK BY AND VERIFIED WITH: J [facility] PHARMD [id number] 06/21/18 A BROWNING    Klebsiella oxytoca NOT DETECTED NOT DETECTED Final   Klebsiella pneumoniae NOT DETECTED NOT DETECTED Final   Proteus species NOT DETECTED NOT DETECTED Final   Serratia marcescens NOT DETECTED NOT DETECTED Final   Carbapenem resistance NOT DETECTED NOT DETECTED Final   Haemophilus influenzae NOT DETECTED NOT DETECTED Final   Neisseria meningitidis NOT DETECTED NOT DETECTED Final   Pseudomonas aeruginosa NOT DETECTED NOT DETECTED Final   Candida albicans NOT DETECTED NOT DETECTED Final   Candida glabrata NOT DETECTED NOT DETECTED Final   Candida krusei NOT DETECTED NOT DETECTED Final   Candida parapsilosis NOT DETECTED NOT DETECTED Final   Candida tropicalis NOT DETECTED NOT DETECTED Final    Comment: Performed at [facility] Lab, [address] [place], [place] [address]  Urine culture     Status: Abnormal   Collection Time: 06/20/18  6:19 PM  Result Value Ref Range Status   Specimen Description URINE, CLEAN CATCH  Final   Special Requests   Final    NONE Performed at [facility] Lab, [address], [place], [place] [address]    Culture 60,000 COLONIES/mL ESCHERICHIA COLI (A)  Final   Report Status 06/22/2018 FINAL  Final   Organism ID, Bacteria ESCHERICHIA COLI (A)  Final      Susceptibility   Escherichia coli - MIC*    AMPICILLIN >=32 RESISTANT Resistant     CEFAZOLIN 8 SENSITIVE Sensitive     CEFTRIAXONE <=1 SENSITIVE Sensitive      CIPROFLOXACIN <=0.25 SENSITIVE Sensitive     GENTAMICIN <=1 SENSITIVE Sensitive     IMIPENEM <=0.25 SENSITIVE Sensitive     NITROFURANTOIN 32 SENSITIVE Sensitive     TRIMETH/SULFA <=20 SENSITIVE Sensitive     AMPICILLIN/SULBACTAM >=32 RESISTANT Resistant     PIP/TAZO 8 SENSITIVE Sensitive     * 60,000 COLONIES/mL ESCHERICHIA COLI  Studies: No results found.  Scheduled Meds: . aspirin EC  81 mg Oral Daily  . atorvastatin  20 mg Oral QHS  . docusate sodium  100 mg Oral BID  . enoxaparin (LOVENOX) injection  40 mg Subcutaneous Q24H  . gabapentin  1,200 mg Oral TID  . insulin aspart  0-15 Units Subcutaneous TID WC  . insulin aspart  0-5 Units Subcutaneous QHS  . insulin glargine  60 Units Subcutaneous Daily  . methimazole  5 mg Oral Daily  . sodium chloride flush  3 mL Intravenous Q12H    Continuous Infusions: . sodium chloride 250 mL (06/20/18 1230)  . cefTRIAXone (ROCEPHIN)  IV Stopped (06/22/18 0853)  . lactated ringers 75 mL/hr at 06/21/18 1309     LOS: 1 day     Darlin Drop, MD [facility] Pager [phone]  If 7PM-7AM, please contact night-coverage [email or web address] Password [facility] 06/22/2018, 5:39 PM

## 2018-06-23 DIAGNOSIS — R7881 Bacteremia: Secondary | ICD-10-CM

## 2018-06-23 LAB — BASIC METABOLIC PANEL
ANION GAP: 7 (ref 5–15)
BUN: 13 mg/dL (ref 6–20)
CALCIUM: 8.4 mg/dL — AB (ref 8.9–10.3)
CHLORIDE: 107 mmol/L (ref 98–111)
CO2: 26 mmol/L (ref 22–32)
Creatinine, Ser: 0.92 mg/dL (ref 0.44–1.00)
GFR calc non Af Amer: >60 mL/min (ref >60)
GLUCOSE: 112 mg/dL — AB (ref 70–99)
Potassium: 3.6 mmol/L (ref 3.5–5.1)
Sodium: 140 mmol/L (ref 135–145)

## 2018-06-23 LAB — CULTURE, BLOOD (ROUTINE X 2)
SPECIAL REQUESTS: ADEQUATE
Special Requests: ADEQUATE

## 2018-06-23 LAB — GLUCOSE, CAPILLARY
GLUCOSE-CAPILLARY: 182 mg/dL — AB (ref 70–99)
GLUCOSE-CAPILLARY: 321 mg/dL — AB (ref 70–99)
Glucose-Capillary: 91 mg/dL (ref 70–99)
Glucose-Capillary: 219 mg/dL — ABNORMAL HIGH (ref 70–99)

## 2018-06-23 LAB — CBC
HCT: 30.1 % — ABNORMAL LOW (ref 36.0–46.0)
HEMOGLOBIN: 9.8 g/dL — AB (ref 12.0–15.0)
MCH: 27.2 pg (ref 26.0–34.0)
MCHC: 32.6 g/dL (ref 30.0–36.0)
MCV: 83.6 fL (ref 78.0–100.0)
Platelets: 230 10*3/uL (ref 150–400)
RBC: 3.6 MIL/uL — ABNORMAL LOW (ref 3.87–5.11)
RDW: 12.4 % (ref 11.5–15.5)
WBC: 8.8 10*3/uL (ref 4.0–10.5)

## 2018-06-23 LAB — PHOSPHORUS: PHOSPHORUS: 3.6 mg/dL (ref 2.5–4.6)

## 2018-06-23 LAB — PROCALCITONIN: Procalcitonin: 17.44 ng/mL

## 2018-06-23 LAB — LACTIC ACID, PLASMA: LACTIC ACID, VENOUS: 0.7 mmol/L (ref 0.5–1.9)

## 2018-06-23 LAB — MAGNESIUM: Magnesium: 1.8 mg/dL (ref 1.7–2.4)

## 2018-06-23 MED ORDER — MAGNESIUM SULFATE 2 GM/50ML IV SOLN
2.0000 g | Freq: Once | INTRAVENOUS | Status: AC
  Administered 2018-06-23: 2 g via INTRAVENOUS
  Filled 2018-06-23: qty 50

## 2018-06-23 NOTE — Progress Notes (Signed)
Inpatient Diabetes Program Recommendations  AACE/ADA: New Consensus Statement on Inpatient Glycemic Control (2015)  Target Ranges:  Prepandial:   less than 140 mg/dL      Peak postprandial:   less than 180 mg/dL (1-2 hours)      Critically ill patients:  140 - 180 mg/dL   Results for Rebecca [name], Rebecca [name] (MRN [id number]) as of 06/23/2018 14:04  Ref. Range 06/22/2018 07:31 06/22/2018 11:19 06/22/2018 17:04 06/22/2018 20:38 06/23/2018 07:55 06/23/2018 11:26  Glucose-Capillary Latest Ref Range: 70 - 99 mg/dL 87 409 (H) 811 (H) 914 (H) 91 219 (H)   Review of Glycemic Control  Diabetes history: Type 2 DM  Outpatient Diabetes medications: Lantus 60 units Daily, Metformin 1000 mg Daily Current orders for Inpatient glycemic control:  Lantus 60 units daily, Novolog moderate tid with meals and HS  Inpatient Diabetes Program Recommendations:    Glucose increases after meals into the 200 range. Consider Novolog 2 units tid meal coverage if patient consumes at least 50% of meals.  Thanks,  Christena Deem RN, MSN, BC-ADM Inpatient Diabetes Coordinator Team Pager [phone] (8a-5p)

## 2018-06-23 NOTE — Evaluation (Addendum)
Physical Therapy Evaluation Patient Details Name: Rebecca [name] MRN: [id number] DOB: 12-09-1965 Today's Date: 06/23/2018   History of Present Illness  Pt is a 52 y.o. F with significant PMH of hyperthyroidism, CAD, HTN, HLD, and DM who presents with flank pain. Admitted with sepsis secondary to UTI.  Clinical Impression  Patient evaluated by Physical Therapy with no further acute PT needs identified. Patient seems close to baseline in regards to functional mobility.Denies pain, dizziness, or acute weakness. Ambulating hallway distances without difficulty with no assistive device. All education has been completed and the patient has no further questions. No follow-up Physical Therapy or equipment needs. PT is signing off. Thank you for this referral.  [employer] interpreter [phone] assisted with this session.     Follow Up Recommendations No PT follow up    Equipment Recommendations  None recommended by PT    Recommendations for Other Services       Precautions / Restrictions Precautions Precautions: None Restrictions Weight Bearing Restrictions: No      Mobility  Bed Mobility Overal bed mobility: Independent                Transfers Overall transfer level: Independent Equipment used: None                Ambulation/Gait Ambulation/Gait assistance: Independent Educational psychologist (Feet): 180 Feet Assistive device: IV Pole Gait Pattern/deviations: WFL(Within Functional Limits)   Gait velocity interpretation: 1.31 - 2.62 ft/sec, indicative of limited community ambulator General Gait Details: Patient with slightly slower gait speed but overall no evidence of gross unsteadiness  Stairs            Wheelchair Mobility    Modified Rankin (Stroke Patients Only)       Balance Overall balance assessment: No apparent balance deficits (not formally assessed)                                           Pertinent Vitals/Pain Pain Assessment:  No/denies pain    Home Living Family/patient expects to be discharged to:: Private residence Living Arrangements: Children(son) Available Help at Discharge: Family;Available 24 hours/day Type of Home: Apartment Home Access: Level entry     Home Layout: Two level;Bed/bath upstairs Home Equipment: None      Prior Function Level of Independence: Independent         Comments: Does not work     Higher education careers adviser Dominance   Dominant Hand: Right    Extremity/Trunk Assessment   Upper Extremity Assessment Upper Extremity Assessment: Overall WFL for tasks assessed    Lower Extremity Assessment Lower Extremity Assessment: Overall WFL for tasks assessed    Cervical / Trunk Assessment Cervical / Trunk Assessment: Normal  Communication   Communication: Prefers language other than English(Spanish)  Cognition Arousal/Alertness: Awake/alert Behavior During Therapy: WFL for tasks assessed/performed Overall Cognitive Status: Within Functional Limits for tasks assessed                                        General Comments      Exercises     Assessment/Plan    PT Assessment Patent does not need any further PT services  PT Problem List         PT Treatment Interventions      PT Goals (Current goals can  be found in the Care Plan section)  Acute Rehab PT Goals Patient Stated Goal: none stated; agreeable to participate in physical therapy evaluation PT Goal Formulation: All assessment and education complete, DC therapy    Frequency     Barriers to discharge        Co-evaluation               AM-PAC PT "6 Clicks" Daily Activity  Outcome Measure Difficulty turning over in bed (including adjusting bedclothes, sheets and blankets)?: None Difficulty moving from lying on back to sitting on the side of the bed? : None Difficulty sitting down on and standing up from a chair with arms (e.g., wheelchair, bedside commode, etc,.)?: None Help needed moving to and  from a bed to chair (including a wheelchair)?: None Help needed walking in hospital room?: None Help needed climbing 3-5 steps with a railing? : None 6 Click Score: 24    End of Session   Activity Tolerance: Patient tolerated treatment well Patient left: in bed;with call bell/phone within reach   PT Visit Diagnosis: Difficulty in walking, not elsewhere classified (R26.2)    Time: 1610-9604 PT Time Calculation (min) (ACUTE ONLY): 10 min   Charges:   PT Evaluation $PT Eval Low Complexity: 1 Low         Laurina Bustle, PT, DPT Acute Rehabilitation Services Pager [phone] Office [phone]   Vanetta Mulders 06/23/2018, 10:08 AM

## 2018-06-23 NOTE — Progress Notes (Signed)
PROGRESS NOTE  Rebecca [name] ZOX:[id number] DOB: 02/10/1966 DOA: 06/20/2018 PCP: Loletta Specter, PA-C  HPI/Recap of past 24 hours:  Rebecca [name] is an 52 y.o. female with medical history significant ofhyperthyroidism; CAD (distal LAD dissection, medical therapy only); HTN; HLD; and DM presenting with flank pain.Las night, she noticed pain in her right flank. She noticed urinary urgency with hesitation. She awoke with frank hematuria. She had chills and sweats at home this AM but did not check her temperature. She has never had a UTI before. She is not sexually active. She has ongoing mild but improved suprapubic pain and right flank pain.  06/22/2018: Patient seen and examined with her daughter, son-in-law and grandson at bedside.  Reports her symptoms are improving.  On IV Rocephin for E. coli bacteremia.  06/23/18: Seen and examined at her bedside. Abdominal pain and nausea improving. Procalcitonin still elevated at 17 however trending down. Continue IV antibiotics for e-coli bacteremia.  Assessment/Plan: Principal Problem:   Sepsis secondary to UTI [facility]) Active Problems:   DM (diabetes mellitus), type 2, uncontrolled (HCC)   Essential hypertension   CAD- with distal LAD disease, ? 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 coli bacteremia Suspect from urosepsis Sepsis physiology is impproving Procalcitonin still elevated but improving 17 from 39 C/w rocephin- Blood cultures positive for E. coli resistant to Unasyn Monitor fever curve and WBC Repeat procalcitonin and CBC in the am  Urosepsis Management as stated above  Resolved Hypokalemia post repletion  Hypomagnesemia Repleted with 2g IV magnesium once  Chronic normocytic anemia No sign of overt bleeding at this time Repeat CBC and obtain iron studies.   Code Status: Full code  Family Communication: Daughter and son-in-law at bedside on  06/22/18  Disposition Plan: Home when clinically stable possibly tomorrow 06/24/2018   Consultants:  None  Procedures:  None  Antimicrobials:  Rocephin  DVT prophylaxis: Subcu Lovenox daily   Objective: Vitals:   06/22/18 1700 06/22/18 2038 06/23/18 0508 06/23/18 0754  BP: (!) 155/70 139/78 137/72 134/64  Pulse: 99 100 94 90  Resp: 19 20 18 18   Temp: 100.1 F (37.8 C) 98.6 F (37 C) 98 F (36.7 C) 98.2 F (36.8 C)  TempSrc: Oral Oral  Oral  SpO2: 98% 96% 97% 97%  Weight:      Height:        Intake/Output Summary (Last 24 hours) at 06/23/2018 1004 Last data filed at 06/23/2018 0942 Gross per 24 hour  Intake 2770.97 ml  Output 1450 ml  Net 1320.97 ml   Filed Weights   06/20/18 1226 06/21/18 2157  Weight: 82.1 kg 85.3 kg    Exam:  . General: 52 y.o. year-old female WD WN NAD A&O x 3 . Cardiovascular: RRR no rubs or gallops. No JVD or thyromegaly. Marland Kitchen Respiratory: Clear to auscultation with no wheezes or rales. Good inspiratory effort. . Abdomen: Soft nontender nondistended with normal bowel sounds x4 quadrants. . Musculoskeletal: No lower extremity edema. 2/4 pulses in all 4 extremities. . Skin: No ulcerative lesions noted or rashes, . Psychiatry: Mood is appropriate for condition and setting   Data Reviewed: CBC: Recent Labs  Lab 06/20/18 1025 06/22/18 0438 06/23/18 0727  WBC 5.1 12.2* 8.8  HGB 12.8 9.4* 9.8*  HCT 39.9 28.8* 30.1*  MCV 86.4 84.5 83.6  PLT 258 224 230   Basic Metabolic Panel: Recent Labs  Lab 06/20/18 1025 06/21/18 0005 06/22/18 0438 06/23/18  0727  NA 131* 138 137 140  K 5.2* 4.1 3.4* 3.6  CL 96* 106 108 107  CO2 24 22 24 26   GLUCOSE 609* 218* 108* 112*  BUN 36* 23* 12 13  CREATININE 1.68* 1.40* 1.03* 0.92  CALCIUM 9.6 8.4* 8.0* 8.4*  MG  --   --   --  1.8  PHOS  --   --   --  3.6   GFR: Estimated Creatinine Clearance: 72.5 mL/min (by C-G formula based on SCr of 0.92 mg/dL). Liver Function Tests: No results for  input(s): AST, ALT, ALKPHOS, BILITOT, PROT, ALBUMIN in the last 168 hours. No results for input(s): LIPASE, AMYLASE in the last 168 hours. No results for input(s): AMMONIA in the last 168 hours. Coagulation Profile: No results for input(s): INR, PROTIME in the last 168 hours. Cardiac Enzymes: No results for input(s): CKTOTAL, CKMB, CKMBINDEX, TROPONINI in the last 168 hours. BNP (last 3 results) No results for input(s): PROBNP in the last 8760 hours. HbA1C: Recent Labs    06/20/18 1527  HGBA1C 10.8*   CBG: Recent Labs  Lab 06/22/18 0731 06/22/18 1119 06/22/18 1704 06/22/18 2038 06/23/18 0755  GLUCAP 87 201* 171* 155* 91   Lipid Profile: No results for input(s): CHOL, HDL, LDLCALC, TRIG, CHOLHDL, LDLDIRECT in the last 72 hours. Thyroid Function Tests: Recent Labs    06/22/18 0438  TSH 2.453   Anemia Panel: No results for input(s): VITAMINB12, FOLATE, FERRITIN, TIBC, IRON, RETICCTPCT in the last 72 hours. Urine analysis:    Component Value Date/Time   COLORURINE YELLOW 06/20/2018 1842   APPEARANCEUR HAZY (A) 06/20/2018 1842   LABSPEC 1.017 06/20/2018 1842   PHURINE 5.0 06/20/2018 1842   GLUCOSEU >=500 (A) 06/20/2018 1842   HGBUR LARGE (A) 06/20/2018 1842   BILIRUBINUR NEGATIVE 06/20/2018 1842   KETONESUR NEGATIVE 06/20/2018 1842   PROTEINUR 100 (A) 06/20/2018 1842   UROBILINOGEN 0.2 05/13/2015 0411   NITRITE POSITIVE (A) 06/20/2018 1842   LEUKOCYTESUR MODERATE (A) 06/20/2018 1842   Sepsis Labs: @LABRCNTIP (procalcitonin:4,lacticidven:4)  ) Recent Results (from the past 240 hour(s))  Blood Culture (routine x 2)     Status: Abnormal   Collection Time: 06/20/18 12:11 PM  Result Value Ref Range Status   Specimen Description BLOOD BLOOD LEFT WRIST  Final   Special Requests   Final    BOTTLES DRAWN AEROBIC ONLY Blood Culture adequate volume   Culture  Setup Time   Final    GRAM NEGATIVE RODS AEROBIC BOTTLE ONLY CRITICAL RESULT CALLED TO, READ BACK BY AND  VERIFIED WITH: J [facility] PHARMD [id number] 06/21/18 A BROWNING    Culture (A)  Final    ESCHERICHIA COLI SUSCEPTIBILITIES PERFORMED ON PREVIOUS CULTURE WITHIN THE LAST 5 DAYS. Performed at [facility], [address] [place], [place] [address]    Report Status 06/23/2018 FINAL  Final  Blood Culture (routine x 2)     Status: Abnormal   Collection Time: 06/20/18 12:23 PM  Result Value Ref Range Status   Specimen Description BLOOD BLOOD RIGHT HAND  Final   Special Requests   Final    BOTTLES DRAWN AEROBIC AND ANAEROBIC Blood Culture adequate volume   Culture  Setup Time   Final    IN BOTH AEROBIC AND ANAEROBIC BOTTLES GRAM NEGATIVE RODS CRITICAL RESULT CALLED TO, READ BACK BY AND VERIFIED WITHMelven Sartor[facility] 4098 06/21/18 A BROWNING Performed at [facility], [address], [place], [place] [address]    Culture  ESCHERICHIA COLI (A)  Final   Report Status 06/23/2018 FINAL  Final   Organism ID, Bacteria ESCHERICHIA COLI  Final      Susceptibility   Escherichia coli - MIC*    AMPICILLIN >=32 RESISTANT Resistant     CEFAZOLIN 8 SENSITIVE Sensitive     CEFEPIME <=1 SENSITIVE Sensitive     CEFTAZIDIME <=1 SENSITIVE Sensitive     CEFTRIAXONE <=1 SENSITIVE Sensitive     CIPROFLOXACIN <=0.25 SENSITIVE Sensitive     GENTAMICIN <=1 SENSITIVE Sensitive     IMIPENEM <=0.25 SENSITIVE Sensitive     TRIMETH/SULFA <=20 SENSITIVE Sensitive     AMPICILLIN/SULBACTAM >=32 RESISTANT Resistant     PIP/TAZO 8 SENSITIVE Sensitive     Extended ESBL NEGATIVE Sensitive     * ESCHERICHIA COLI  Blood Culture ID Panel (Reflexed)     Status: Abnormal   Collection Time: 06/20/18 12:23 PM  Result Value Ref Range Status   Enterococcus species NOT DETECTED NOT DETECTED Final   Listeria monocytogenes NOT DETECTED NOT DETECTED Final   Staphylococcus species NOT DETECTED NOT DETECTED Final   Staphylococcus aureus NOT DETECTED NOT DETECTED Final   Streptococcus species NOT DETECTED NOT DETECTED Final    Streptococcus agalactiae NOT DETECTED NOT DETECTED Final   Streptococcus pneumoniae NOT DETECTED NOT DETECTED Final   Streptococcus pyogenes NOT DETECTED NOT DETECTED Final   Acinetobacter baumannii NOT DETECTED NOT DETECTED Final   Enterobacteriaceae species DETECTED (A) NOT DETECTED Final    Comment: Enterobacteriaceae represent a large family of gram-negative bacteria, not a single organism. CRITICAL RESULT CALLED TO, READ BACK BY AND VERIFIED WITH: J [facility] PHARMD [id number] 06/21/18 A BROWNING    Enterobacter cloacae complex NOT DETECTED NOT DETECTED Final   Escherichia coli DETECTED (A) NOT DETECTED Final    Comment: CRITICAL RESULT CALLED TO, READ BACK BY AND VERIFIED WITH: J [facility] PHARMD 1308 06/21/18 A BROWNING    Klebsiella oxytoca NOT DETECTED NOT DETECTED Final   Klebsiella pneumoniae NOT DETECTED NOT DETECTED Final   Proteus species NOT DETECTED NOT DETECTED Final   Serratia marcescens NOT DETECTED NOT DETECTED Final   Carbapenem resistance NOT DETECTED NOT DETECTED Final   Haemophilus influenzae NOT DETECTED NOT DETECTED Final   Neisseria meningitidis NOT DETECTED NOT DETECTED Final   Pseudomonas aeruginosa NOT DETECTED NOT DETECTED Final   Candida albicans NOT DETECTED NOT DETECTED Final   Candida glabrata NOT DETECTED NOT DETECTED Final   Candida krusei NOT DETECTED NOT DETECTED Final   Candida parapsilosis NOT DETECTED NOT DETECTED Final   Candida tropicalis NOT DETECTED NOT DETECTED Final    Comment: Performed at [facility], [address], [place], [place] [address]  Urine culture     Status: Abnormal   Collection Time: 06/20/18  6:19 PM  Result Value Ref Range Status   Specimen Description URINE, CLEAN CATCH  Final   Special Requests   Final    NONE Performed at [facility], [address], [place], [place] [address]    Culture 60,000 COLONIES/mL ESCHERICHIA COLI (A)  Final   Report Status 06/22/2018 FINAL  Final   Organism ID, Bacteria ESCHERICHIA  COLI (A)  Final      Susceptibility   Escherichia coli - MIC*    AMPICILLIN >=32 RESISTANT Resistant     CEFAZOLIN 8 SENSITIVE Sensitive     CEFTRIAXONE <=1 SENSITIVE Sensitive     CIPROFLOXACIN <=0.25 SENSITIVE Sensitive     GENTAMICIN <=1 SENSITIVE Sensitive  IMIPENEM <=0.25 SENSITIVE Sensitive     NITROFURANTOIN 32 SENSITIVE Sensitive     TRIMETH/SULFA <=20 SENSITIVE Sensitive     AMPICILLIN/SULBACTAM >=32 RESISTANT Resistant     PIP/TAZO 8 SENSITIVE Sensitive     * 60,000 COLONIES/mL ESCHERICHIA COLI  Culture, blood (routine x 2)     Status: None (Preliminary result)   Collection Time: 06/22/18  8:05 PM  Result Value Ref Range Status   Specimen Description BLOOD LEFT ANTECUBITAL  Final   Special Requests   Final    BOTTLES DRAWN AEROBIC AND ANAEROBIC Blood Culture results may not be optimal due to an excessive volume of blood received in culture bottles   Culture   Final    NO GROWTH < 12 HOURS Performed at [facility], [address] [place], [place] [address]    Report Status PENDING  Incomplete  Culture, blood (routine x 2)     Status: None (Preliminary result)   Collection Time: 06/22/18  8:05 PM  Result Value Ref Range Status   Specimen Description BLOOD RIGHT HAND  Final   Special Requests   Final    BOTTLES DRAWN AEROBIC ONLY Blood Culture adequate volume   Culture   Final    NO GROWTH < 12 HOURS Performed at [facility], [address], [place], [place] [address]    Report Status PENDING  Incomplete      Studies: No results found.  Scheduled Meds: . aspirin EC  81 mg Oral Daily  . atorvastatin  20 mg Oral QHS  . docusate sodium  100 mg Oral BID  . enoxaparin (LOVENOX) injection  40 mg Subcutaneous Q24H  . gabapentin  1,200 mg Oral TID  . insulin aspart  0-15 Units Subcutaneous TID WC  . insulin aspart  0-5 Units Subcutaneous QHS  . insulin glargine  60 Units Subcutaneous Daily  . methimazole  5 mg Oral Daily  . sodium chloride flush  3  mL Intravenous Q12H    Continuous Infusions: . sodium chloride 250 mL (06/20/18 1230)  . cefTRIAXone (ROCEPHIN)  IV 2 g (06/23/18 0130)  . lactated ringers 75 mL/hr at 06/21/18 1309  . magnesium sulfate 1 - 4 g bolus IVPB       LOS: 2 days     Darlin Drop, MD [facility] Pager [phone]  If 7PM-7AM, please contact night-coverage [email or web address] Password [email or web address] 06/23/2018, 10:04 AM

## 2018-06-24 LAB — GLUCOSE, CAPILLARY
Glucose-Capillary: 121 mg/dL — ABNORMAL HIGH (ref 70–99)
Glucose-Capillary: 181 mg/dL — ABNORMAL HIGH (ref 70–99)

## 2018-06-24 LAB — CBC
HCT: 29.4 % — ABNORMAL LOW (ref 36.0–46.0)
Hemoglobin: 9.6 g/dL — ABNORMAL LOW (ref 12.0–15.0)
MCH: 27.5 pg (ref 26.0–34.0)
MCHC: 32.7 g/dL (ref 30.0–36.0)
MCV: 84.2 fL (ref 78.0–100.0)
PLATELETS: 260 10*3/uL (ref 150–400)
RBC: 3.49 MIL/uL — ABNORMAL LOW (ref 3.87–5.11)
RDW: 12.5 % (ref 11.5–15.5)
WBC: 7.6 10*3/uL (ref 4.0–10.5)

## 2018-06-24 LAB — PROCALCITONIN: Procalcitonin: 7.49 ng/mL

## 2018-06-24 MED ORDER — LOSARTAN POTASSIUM 50 MG PO TABS
50.0000 mg | ORAL_TABLET | Freq: Every day | ORAL | Status: DC
  Administered 2018-06-24: 50 mg via ORAL
  Filled 2018-06-24: qty 1

## 2018-06-24 MED ORDER — INSULIN ASPART 100 UNIT/ML ~~LOC~~ SOLN: 0.0000 [IU] | Freq: Every day | SUBCUTANEOUS | 0 refills | Status: DC

## 2018-06-24 MED ORDER — CEPHALEXIN 500 MG PO CAPS
500.0000 mg | ORAL_CAPSULE | Freq: Three times a day (TID) | ORAL | Status: DC
  Administered 2018-06-24: 500 mg via ORAL
  Filled 2018-06-24 (×2): qty 1

## 2018-06-24 MED ORDER — INSULIN ASPART 100 UNIT/ML ~~LOC~~ SOLN: 2.0000 [IU] | Freq: Three times a day (TID) | SUBCUTANEOUS | Status: DC

## 2018-06-24 MED ORDER — LOSARTAN POTASSIUM 50 MG PO TABS: 50.0000 mg | ORAL_TABLET | Freq: Every day | ORAL | 0 refills | Status: DC

## 2018-06-24 MED ORDER — CEPHALEXIN 500 MG PO CAPS: 500.0000 mg | ORAL_CAPSULE | Freq: Three times a day (TID) | ORAL | 0 refills | Status: AC

## 2018-06-24 MED ORDER — INSULIN ASPART 100 UNIT/ML ~~LOC~~ SOLN: 2.0000 [IU] | Freq: Three times a day (TID) | SUBCUTANEOUS | 0 refills | Status: DC

## 2018-06-24 MED ORDER — INSULIN ASPART 100 UNIT/ML ~~LOC~~ SOLN: 0.0000 [IU] | Freq: Three times a day (TID) | SUBCUTANEOUS | 0 refills | Status: DC

## 2018-06-24 NOTE — Discharge Summary (Signed)
Discharge Summary  Rebecca [name]:[id number] DOB: 1966/06/17  PCP: Loletta Specter, PA-C  Admit date: 06/20/2018 Discharge date: 06/24/2018  Time spent: 25 minutes  Recommendations for Outpatient Follow-up:  1. Follow-up with your PCP 2. Take your medications as prescribed  Discharge Diagnoses:  Active Hospital Problems   Diagnosis Date Noted  . Sepsis secondary to UTI (HCC) 06/20/2018  . Bacteremia 06/21/2018  . AKI (acute kidney injury) (HCC) 03/04/2016  . Essential hypertension 05/14/2015  . CAD- with distal LAD disease, ? 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 05/14/2015  . DM (diabetes mellitus), type 2, uncontrolled (HCC)     Resolved Hospital Problems  No resolved problems to display.    Discharge Condition: Stable  Diet recommendation: Heart healthy diabetic diet  Vitals:   06/23/18 2020 06/24/18 0728  BP: (!) 157/77 (!) 144/80  Pulse: 100 82  Resp: 17 18  Temp: 98.3 F (36.8 C) 98.1 F (36.7 C)  SpO2: 98% 97%    History of present illness:  Rebecca I Floresis an 52 y.o.femalewith medical history significant ofhyperthyroidism; CAD (distal LAD dissection, medical therapy only); HTN; HLD; and DM presenting with flank pain.Las night, she noticed pain in her right flank. She noticed urinary urgency with hesitation. She awoke with frank hematuria. She had chills and sweats at home this AM but did not check her temperature. She has never had a UTI before. She is not sexually active. She has ongoing mild but improved suprapubic pain and right flank pain.  06/22/2018: Patient seen and examined with her daughter, son-in-law and grandson at bedside.  Reports her symptoms are improving.  On IV Rocephin for E. coli bacteremia.  06/23/18: Seen and examined at her bedside. Abdominal pain and nausea improving. Procalcitonin still elevated at 17 however trending down. Continue IV antibiotics for e-coli bacteremia.  06/24/2018: Patient seen and examined at bedside.  No acute  events overnight.  She has no new complaints.  The day of discharge, the patient was hemodynamically stable.  She will need to follow-up with her PCP post hospitalization.  Hospital Course:  Principal Problem:   Sepsis secondary to UTI [facility]) Active Problems:   DM (diabetes mellitus), type 2, uncontrolled (HCC)   Essential hypertension   CAD- with distal LAD disease, ? 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 coli bacteremia Most likely from urosepsis Sepsis physiology is resolving Procalcitonin continues to trend down to 7 on 06/24/2018 from 17-day prior from 39 on admission.  Completed 5 days of IV antibiotics, Rocephin Blood cultures positive for E. coli resistant to Unasyn Repeated blood cultures done on 06/22/2018- to date Started Keflex 500 mg 3 times daily x9 days Follow-up with your PCP posthospitalization  Urosepsis Management as stated above  Resolved Hypokalemia post repletion  Hypomagnesemia Repleted with 2g IV magnesium once  Chronic normocytic anemia No sign of overt bleeding at this time Follow-up with PCP   Code Status: Full code   Consultants:  None  Procedures:  None  Antimicrobials:  Rocephin    Discharge Exam: BP (!) 144/80 (BP Location: Right Arm)   Pulse 82   Temp 98.1 F (36.7 C) (Oral)   Resp 18   Ht 5\' 2"  (1.575 m)   Wt 84.5 kg   LMP  (LMP Unknown)   SpO2 97%   BMI 34.07 kg/m  . General: 52 y.o. year-old female well developed well nourished in no acute distress.  Alert and oriented x3. . Cardiovascular: Regular rate  and rhythm with no rubs or gallops.  No thyromegaly or JVD noted.   Marland Kitchen Respiratory: Clear to auscultation with no wheezes or rales. Good inspiratory effort. . Abdomen: Soft nontender nondistended with normal bowel sounds x4 quadrants. . Musculoskeletal: No lower extremity edema. 2/4 pulses in all 4 extremities. . Skin: No ulcerative lesions noted or  rashes, . Psychiatry: Mood is appropriate for condition and setting  Discharge Instructions You were cared for by a hospitalist during your hospital stay. If you have any questions about your discharge medications or the care you received while you were in the hospital after you are discharged, you can call the unit and asked to speak with the hospitalist on call if the hospitalist that took care of you is not available. Once you are discharged, your primary care physician will handle any further medical issues. Please note that NO REFILLS for any discharge medications will be authorized once you are discharged, as it is imperative that you return to your primary care physician (or establish a relationship with a primary care physician if you do not have one) for your aftercare needs so that they can reassess your need for medications and monitor your lab values.   Allergies as of 06/24/2018   No Known Allergies     Medication List    STOP taking these medications   acetaminophen 500 MG tablet Commonly known as:  TYLENOL   metFORMIN 500 MG 24 hr tablet Commonly known as:  GLUCOPHAGE-XR     TAKE these medications   aspirin 81 MG EC tablet Take 1 tablet (81 mg total) by mouth daily.   atorvastatin 20 MG tablet Commonly known as:  LIPITOR Take 1 tablet (20 mg total) by mouth at bedtime.   cephALEXin 500 MG capsule Commonly known as:  KEFLEX Take 1 capsule (500 mg total) by mouth 3 (three) times daily for 9 days.   gabapentin 600 MG tablet Commonly known as:  NEURONTIN Take 2 tablets (1,200 mg total) by mouth 3 (three) times daily.   insulin aspart 100 UNIT/ML injection Commonly known as:  novoLOG Inject 0-15 Units into the skin 3 (three) times daily with meals.   insulin aspart 100 UNIT/ML injection Commonly known as:  novoLOG Inject 2 Units into the skin 3 (three) times daily with meals.   insulin aspart 100 UNIT/ML injection Commonly known as:  novoLOG Inject 0-5 Units  into the skin at bedtime.   Insulin Glargine 100 UNIT/ML Solostar Pen Commonly known as:  LANTUS Inject 60 Units into the skin daily.   losartan 50 MG tablet Commonly known as:  COZAAR Take 1 tablet (50 mg total) by mouth daily. Start taking on:  06/25/2018 What changed:    medication strength  how much to take   methimazole 5 MG tablet Commonly known as:  TAPAZOLE Take 1 tablet (5 mg total) by mouth daily.   nitroGLYCERIN 0.4 MG SL tablet Commonly known as:  NITROSTAT Place 1 tablet (0.4 mg total) under the tongue every 5 (five) minutes as needed for chest pain.   Pen Needles 31G X 8 MM Misc Inject 1 each into the skin at bedtime.      No Known Allergies Follow-up Information    Loletta Specter, PA-C. Call in 1 day(s).   Specialty:  Physician Assistant Why:  Please call for post hospital follow up appointment. Contact information: [address] [place] [address] [phone]            The  results of significant diagnostics from this hospitalization (including imaging, microbiology, ancillary and laboratory) are listed below for reference.    Significant Diagnostic Studies: Ms Digital Screening Tomo Bilateral  Result Date: 06/17/2018 CLINICAL DATA:  Screening. EXAM: DIGITAL SCREENING BILATERAL MAMMOGRAM WITH TOMO AND CAD COMPARISON:  Previous exam(s). ACR Breast Density Category c: The breast tissue is heterogeneously dense, which may obscure small masses. FINDINGS: There are no findings suspicious for malignancy. Images were processed with CAD. IMPRESSION: No mammographic evidence of malignancy. A result letter of this screening mammogram will be mailed directly to the patient. RECOMMENDATION: Screening mammogram in one year. (Code:SM-B-01Y) BI-RADS CATEGORY  1: Negative. Electronically Signed   By: Edwin Cap M.D.   On: 06/17/2018 08:57   Ct Renal Stone Study  Result Date: 06/20/2018 CLINICAL DATA:  Right flank pain with nausea EXAM: CT  ABDOMEN AND PELVIS WITHOUT CONTRAST TECHNIQUE: Multidetector CT imaging of the abdomen and pelvis was performed following the standard protocol without oral or IV contrast. COMPARISON:  September 22, 2004 FINDINGS: Lower chest: Lung bases are clear. Hepatobiliary: No focal liver lesions are appreciable. There is hepatic steatosis. Gallbladder wall is not appreciably thickened. There is no biliary duct dilatation. Pancreas: No pancreatic mass or inflammatory focus. Spleen: No splenic lesions are evident. Adrenals/Urinary Tract: Adrenals bilaterally appear unremarkable. Right kidney is diffusely edematous. There is mild perinephric stranding on the right. There is moderate hydronephrosis on the right. There is no hydronephrosis on the left. There is no evident renal mass on either side. There is no intrarenal calculus on either side. There is no appreciable ureteral calculus on either side. There are phleboliths on the right which are near but appear separate from the distal right ureter. Urinary bladder is midline with wall thickness within normal limits. Stomach/Bowel: There is moderate stool throughout the colon. There is no appreciable bowel wall or mesenteric thickening. There is no evident bowel obstruction. There is no free air or portal venous air. Vascular/Lymphatic: There is no abdominal aortic aneurysm. No vascular lesions are evident on this noncontrast enhanced study. There is no adenopathy in the abdomen or pelvis. Reproductive: Uterus is anteverted. There is no evident pelvic mass. Other: Appendix appears normal. There is no abscess or ascites in the abdomen or pelvis. There is a small ventral hernia containing only fat. Musculoskeletal: There is degenerative change in the lumbar spine. There are no blastic or lytic bone lesions. There is no intramuscular or abdominal wall lesion. IMPRESSION: 1. Moderate hydronephrosis and ureterectasis on the right. Right kidney is edematous with moderate perinephric  stranding on the right. No intrarenal or ureteral calculus evident on the right. Question recent calculus passage to account for these findings. Pyelonephritis is a differential consideration given this appearance. No abscess involving the right kidney is evident on this study. 2. Hepatic steatosis. No focal liver lesions evident on this noncontrast enhanced study. 3. No evident bowel obstruction. No abscess in the abdomen or pelvis evident. Appendix appears normal. 4.  Small ventral hernia containing only fat. Electronically Signed   By: Bretta Bang III M.D.   On: 06/20/2018 13:18    Microbiology: Recent Results (from the past 240 hour(s))  Blood Culture (routine x 2)     Status: Abnormal   Collection Time: 06/20/18 12:11 PM  Result Value Ref Range Status   Specimen Description BLOOD BLOOD LEFT WRIST  Final   Special Requests   Final    BOTTLES DRAWN AEROBIC ONLY Blood Culture adequate volume   Culture  Setup Time   Final    GRAM NEGATIVE RODS AEROBIC BOTTLE ONLY CRITICAL RESULT CALLED TO, READ BACK BY AND VERIFIED WITH: J [facility] PHARMD [id number] 06/21/18 A BROWNING    Culture (A)  Final    ESCHERICHIA COLI SUSCEPTIBILITIES PERFORMED ON PREVIOUS CULTURE WITHIN THE LAST 5 DAYS. Performed at [facility], [address], [place], [place] [address]    Report Status 06/23/2018 FINAL  Final  Blood Culture (routine x 2)     Status: Abnormal   Collection Time: 06/20/18 12:23 PM  Result Value Ref Range Status   Specimen Description BLOOD BLOOD RIGHT HAND  Final   Special Requests   Final    BOTTLES DRAWN AEROBIC AND ANAEROBIC Blood Culture adequate volume   Culture  Setup Time   Final    IN BOTH AEROBIC AND ANAEROBIC BOTTLES GRAM NEGATIVE RODS CRITICAL RESULT CALLED TO, READ BACK BY AND VERIFIED WITHMelven [facility] 1478 06/21/18 A BROWNING Performed at [facility], [address], [place], [place] [address]    Culture ESCHERICHIA COLI (A)  Final   Report Status 06/23/2018  FINAL  Final   Organism ID, Bacteria ESCHERICHIA COLI  Final      Susceptibility   Escherichia coli - MIC*    AMPICILLIN >=32 RESISTANT Resistant     CEFAZOLIN 8 SENSITIVE Sensitive     CEFEPIME <=1 SENSITIVE Sensitive     CEFTAZIDIME <=1 SENSITIVE Sensitive     CEFTRIAXONE <=1 SENSITIVE Sensitive     CIPROFLOXACIN <=0.25 SENSITIVE Sensitive     GENTAMICIN <=1 SENSITIVE Sensitive     IMIPENEM <=0.25 SENSITIVE Sensitive     TRIMETH/SULFA <=20 SENSITIVE Sensitive     AMPICILLIN/SULBACTAM >=32 RESISTANT Resistant     PIP/TAZO 8 SENSITIVE Sensitive     Extended ESBL NEGATIVE Sensitive     * ESCHERICHIA COLI  Blood Culture ID Panel (Reflexed)     Status: Abnormal   Collection Time: 06/20/18 12:23 PM  Result Value Ref Range Status   Enterococcus species NOT DETECTED NOT DETECTED Final   Listeria monocytogenes NOT DETECTED NOT DETECTED Final   Staphylococcus species NOT DETECTED NOT DETECTED Final   Staphylococcus aureus (BCID) NOT DETECTED NOT DETECTED Final   Streptococcus species NOT DETECTED NOT DETECTED Final   Streptococcus agalactiae NOT DETECTED NOT DETECTED Final   Streptococcus pneumoniae NOT DETECTED NOT DETECTED Final   Streptococcus pyogenes NOT DETECTED NOT DETECTED Final   Acinetobacter baumannii NOT DETECTED NOT DETECTED Final   Enterobacteriaceae species DETECTED (A) NOT DETECTED Final    Comment: Enterobacteriaceae represent a large family of gram-negative bacteria, not a single organism. CRITICAL RESULT CALLED TO, READ BACK BY AND VERIFIED WITH: J [facility] PHARMD [id number] 06/21/18 A BROWNING    Enterobacter cloacae complex NOT DETECTED NOT DETECTED Final   Escherichia coli DETECTED (A) NOT DETECTED Final    Comment: CRITICAL RESULT CALLED TO, READ BACK BY AND VERIFIED WITH: J [facility] PHARMD [id number] 06/21/18 A BROWNING    Klebsiella oxytoca NOT DETECTED NOT DETECTED Final   Klebsiella pneumoniae NOT DETECTED NOT DETECTED Final   Proteus species NOT DETECTED NOT DETECTED  Final   Serratia marcescens NOT DETECTED NOT DETECTED Final   Carbapenem resistance NOT DETECTED NOT DETECTED Final   Haemophilus influenzae NOT DETECTED NOT DETECTED Final   Neisseria meningitidis NOT DETECTED NOT DETECTED Final   Pseudomonas aeruginosa NOT DETECTED NOT DETECTED Final   Candida albicans NOT DETECTED NOT DETECTED Final   Candida glabrata NOT DETECTED  NOT DETECTED Final   Candida krusei NOT DETECTED NOT DETECTED Final   Candida parapsilosis NOT DETECTED NOT DETECTED Final   Candida tropicalis NOT DETECTED NOT DETECTED Final    Comment: Performed at [facility], [address] [place], [place] [address]  Urine culture     Status: Abnormal   Collection Time: 06/20/18  6:19 PM  Result Value Ref Range Status   Specimen Description URINE, CLEAN CATCH  Final   Special Requests   Final    NONE Performed at [facility], [address], [place], [place] [address]    Culture 60,000 COLONIES/mL ESCHERICHIA COLI (A)  Final   Report Status 06/22/2018 FINAL  Final   Organism ID, Bacteria ESCHERICHIA COLI (A)  Final      Susceptibility   Escherichia coli - MIC*    AMPICILLIN >=32 RESISTANT Resistant     CEFAZOLIN 8 SENSITIVE Sensitive     CEFTRIAXONE <=1 SENSITIVE Sensitive     CIPROFLOXACIN <=0.25 SENSITIVE Sensitive     GENTAMICIN <=1 SENSITIVE Sensitive     IMIPENEM <=0.25 SENSITIVE Sensitive     NITROFURANTOIN 32 SENSITIVE Sensitive     TRIMETH/SULFA <=20 SENSITIVE Sensitive     AMPICILLIN/SULBACTAM >=32 RESISTANT Resistant     PIP/TAZO 8 SENSITIVE Sensitive     * 60,000 COLONIES/mL ESCHERICHIA COLI  Culture, blood (routine x 2)     Status: None (Preliminary result)   Collection Time: 06/22/18  8:05 PM  Result Value Ref Range Status   Specimen Description BLOOD LEFT ANTECUBITAL  Final   Special Requests   Final    BOTTLES DRAWN AEROBIC AND ANAEROBIC Blood Culture results may not be optimal due to an excessive volume of blood received in culture bottles    Culture   Final    NO GROWTH 2 DAYS Performed at [facility], [address], [place], [place] [address]    Report Status PENDING  Incomplete  Culture, blood (routine x 2)     Status: None (Preliminary result)   Collection Time: 06/22/18  8:05 PM  Result Value Ref Range Status   Specimen Description BLOOD RIGHT HAND  Final   Special Requests   Final    BOTTLES DRAWN AEROBIC ONLY Blood Culture adequate volume   Culture   Final    NO GROWTH 2 DAYS Performed at [facility], [address] [place], [place] [address]    Report Status PENDING  Incomplete     Labs: Basic Metabolic Panel: Recent Labs  Lab 06/20/18 1025 06/21/18 0005 06/22/18 0438 06/23/18 0727  NA 131* 138 137 140  K 5.2* 4.1 3.4* 3.6  CL 96* 106 108 107  CO2 24 22 24 26   GLUCOSE 609* 218* 108* 112*  BUN 36* 23* 12 13  CREATININE 1.68* 1.40* 1.03* 0.92  CALCIUM 9.6 8.4* 8.0* 8.4*  MG  --   --   --  1.8  PHOS  --   --   --  3.6   Liver Function Tests: No results for input(s): AST, ALT, ALKPHOS, BILITOT, PROT, ALBUMIN in the last 168 hours. No results for input(s): LIPASE, AMYLASE in the last 168 hours. No results for input(s): AMMONIA in the last 168 hours. CBC: Recent Labs  Lab 06/20/18 1025 06/22/18 0438 06/23/18 0727 06/24/18 0641  WBC 5.1 12.2* 8.8 7.6  HGB 12.8 9.4* 9.8* 9.6*  HCT 39.9 28.8* 30.1* 29.4*  MCV 86.4 84.5 83.6 84.2  PLT 258 224  230 260   Cardiac Enzymes: No results for input(s): CKTOTAL, CKMB, CKMBINDEX, TROPONINI in the last 168 hours. BNP: BNP (last 3 results) No results for input(s): BNP in the last 8760 hours.  ProBNP (last 3 results) No results for input(s): PROBNP in the last 8760 hours.  CBG: Recent Labs  Lab 06/23/18 1126 06/23/18 1639 06/23/18 2113 06/24/18 0728 06/24/18 1140  GLUCAP 219* 182* 321* 121* 181*       Signed:  Darlin Drop, MD [facility] 06/24/2018, 3:24 PM

## 2018-06-24 NOTE — Care Management Note (Addendum)
Case Management Note  Patient Details  Name: Rebecca [name] MRN: [id number] Date of Birth: 1966/01/08  Subjective/Objective:     Pt discharged.  Daughter to come pick up patient.  Daughter will take scripts to [facility] to get filled prior to 5:30.  Action/Plan: No further CM needs.   Expected Discharge Date:  06/24/18               Expected Discharge Plan:  Home/Self Care  In-House Referral:     Discharge planning Services  CM Consult  Post Acute Care Choice:    Choice offered to:     DME Arranged:    DME Agency:     HH Arranged:    HH Agency:     Status of Service:  Completed, signed off  If discussed at [employer] of Stay Meetings, dates discussed:    Additional Comments:  Deveron Furlong, RN 06/24/2018, 3:50 PM

## 2018-06-24 NOTE — Progress Notes (Signed)
Patient Discharge: Disposition: Patient discharged to home. Education: Reviewed medications with patient and daughter, prescriptions, follow-up appointments and discharge instructions, verbalized understanding. IV: Discontinued IV before discharge. Telemetry: N/A Transportation: Patient walked out of the unit and refused to be escorted. Belongings: Patient took all her belongings with her.

## 2018-06-27 LAB — CULTURE, BLOOD (ROUTINE X 2)
Culture: NO GROWTH
Culture: NO GROWTH
Special Requests: ADEQUATE

## 2018-06-28 MED FILL — NovoLOG 100 UNIT/ML SOLN: 100 | 22 days supply | Qty: 10 | Fill #0

## 2018-06-28 MED FILL — NITROSTAT 0.4 MG TABLET SL: 0.4 | 25 days supply | Qty: 25 | Fill #2

## 2018-06-28 MED FILL — METFORMIN HCL ER 500 MG TAB: 500 | 30 days supply | Qty: 60 | Fill #1

## 2018-06-28 MED FILL — !LANTUS SOLOSTAR 100UNITS/M: 100 | 14 days supply | Qty: 9 | Fill #2

## 2018-06-28 MED FILL — GABAPENTIN 600 MG TABLET: 600 | 30 days supply | Qty: 180 | Fill #1

## 2018-06-28 MED FILL — ATORVASTATIN 20 MG TABLET: 20 | 30 days supply | Qty: 30 | Fill #1

## 2018-06-28 MED FILL — LOSARTAN POTASSIUM 50 MG TA: 50 | 30 days supply | Qty: 30 | Fill #0

## 2018-06-28 MED FILL — methIMAzole 5 MG TABS: 5 | 30 days supply | Qty: 30 | Fill #1

## 2018-06-29 ENCOUNTER — Encounter (HOSPITAL_COMMUNITY): Payer: Self-pay | Admitting: *Deleted

## 2018-07-11 ENCOUNTER — Ambulatory Visit (INDEPENDENT_AMBULATORY_CARE_PROVIDER_SITE_OTHER): Payer: Self-pay | Admitting: Physician Assistant

## 2018-07-25 MED FILL — NovoLOG 100 UNIT/ML SOLN: 100 | 22 days supply | Qty: 10 | Fill #1

## 2018-07-25 MED FILL — LANTUS SOLOSTAR 100 UNITS/M: 100 | 25 days supply | Qty: 15 | Fill #3

## 2018-07-25 MED FILL — GABAPENTIN 600 MG TABLET: 600 | 30 days supply | Qty: 180 | Fill #2

## 2018-07-25 MED FILL — ATORVASTATIN 20 MG TABLET: 20 | 30 days supply | Qty: 30 | Fill #2

## 2018-08-09 ENCOUNTER — Encounter (INDEPENDENT_AMBULATORY_CARE_PROVIDER_SITE_OTHER): Payer: Self-pay | Admitting: Physician Assistant

## 2018-08-09 ENCOUNTER — Other Ambulatory Visit: Payer: Self-pay

## 2018-08-09 ENCOUNTER — Ambulatory Visit (INDEPENDENT_AMBULATORY_CARE_PROVIDER_SITE_OTHER): Payer: Self-pay | Admitting: Physician Assistant

## 2018-08-09 VITALS — BP 138/77 | HR 109 | Temp 97.6°F | Ht 62.0 in | Wt 184.0 lb

## 2018-08-09 DIAGNOSIS — E7841 Elevated Lipoprotein(a): Secondary | ICD-10-CM

## 2018-08-09 DIAGNOSIS — Z794 Long term (current) use of insulin: Secondary | ICD-10-CM

## 2018-08-09 DIAGNOSIS — E0590 Thyrotoxicosis, unspecified without thyrotoxic crisis or storm: Secondary | ICD-10-CM

## 2018-08-09 DIAGNOSIS — E119 Type 2 diabetes mellitus without complications: Secondary | ICD-10-CM

## 2018-08-09 DIAGNOSIS — J029 Acute pharyngitis, unspecified: Secondary | ICD-10-CM

## 2018-08-09 DIAGNOSIS — E1165 Type 2 diabetes mellitus with hyperglycemia: Secondary | ICD-10-CM

## 2018-08-09 DIAGNOSIS — I10 Essential (primary) hypertension: Secondary | ICD-10-CM

## 2018-08-09 MED ORDER — SITAGLIPTIN PHOSPHATE 100 MG PO TABS: 100.0000 mg | ORAL_TABLET | Freq: Every day | ORAL | 5 refills | Status: DC

## 2018-08-09 MED ORDER — GABAPENTIN 600 MG PO TABS: 1200.0000 mg | ORAL_TABLET | Freq: Three times a day (TID) | ORAL | 5 refills | Status: DC

## 2018-08-09 MED ORDER — METFORMIN HCL ER 500 MG PO TB24: 1000.0000 mg | ORAL_TABLET | Freq: Every day | ORAL | 5 refills | Status: DC

## 2018-08-09 MED ORDER — NAPROXEN 500 MG PO TABS: 500.0000 mg | ORAL_TABLET | Freq: Two times a day (BID) | ORAL | 0 refills | Status: DC

## 2018-08-09 MED ORDER — ASPIRIN 81 MG PO TBEC: 81.0000 mg | DELAYED_RELEASE_TABLET | Freq: Every day | ORAL | 1 refills | Status: DC

## 2018-08-09 MED ORDER — ATORVASTATIN CALCIUM 20 MG PO TABS: 20.0000 mg | ORAL_TABLET | Freq: Every day | ORAL | 5 refills | Status: DC

## 2018-08-09 MED ORDER — LOSARTAN POTASSIUM 50 MG PO TABS: 50.0000 mg | ORAL_TABLET | Freq: Every day | ORAL | 5 refills | Status: DC

## 2018-08-09 MED ORDER — DM-APAP-CPM 15-500-2 MG PO TABS: 2.0000 | ORAL_TABLET | Freq: Four times a day (QID) | ORAL | 0 refills | Status: AC

## 2018-08-09 MED ORDER — INSULIN GLARGINE 100 UNIT/ML SOLOSTAR PEN: 60.0000 [IU] | PEN_INJECTOR | Freq: Every day | SUBCUTANEOUS | 5 refills | Status: DC

## 2018-08-09 MED ORDER — PEN NEEDLES 31G X 8 MM MISC: 1.0000 | Freq: Every day | 5 refills | Status: DC

## 2018-08-09 MED ORDER — AMOXICILLIN-POT CLAVULANATE 875-125 MG PO TABS: 1.0000 | ORAL_TABLET | Freq: Two times a day (BID) | ORAL | 0 refills | Status: AC

## 2018-08-09 NOTE — Patient Instructions (Signed)
   Faringitis (Pharyngitis) La faringitis es el dolor de garganta (faringe). La garganta presenta enrojecimiento, hinchazn y dolor. CUIDADOS EN EL HOGAR  Beba suficiente lquido para mantener la orina clara o de color amarillo plido.  Solo tome los medicamentos que le haya indicado su mdico. ? Si no toma los medicamentos segn las indicaciones podra volver a enfermarse. Finalice la prescripcin completa, aunque comience a sentirse mejor. ? No tome aspirina.  Reposo.  Enjuguese la boca (hacer grgaras) con agua y sal (cucharadita de sal por litro de agua) cada 1 o 2horas. Esto ayudar a aliviar el dolor.  Si no corre riesgo de ahogarse, puede chupar un caramelo duro o pastillas para la garganta.  SOLICITE AYUDA SI:  Tiene bultos grandes y dolorosos al tacto en el cuello.  Tiene una erupcin cutnea.  Cuando tose elimina una expectoracin verde, amarillo amarronado o con sangre.  SOLICITE AYUDA DE INMEDIATO SI:  Presenta rigidez en el cuello.  Babea o no puede tragar lquidos.  Vomita o no puede retener los medicamentos ni los lquidos.  Siente un dolor intenso que no se alivia con medicamentos.  Tiene problemas para respirar (y no debido a la nariz tapada).  ASEGRESE DE QUE:  Comprende estas instrucciones.  Controlar su afeccin.  Recibir ayuda de inmediato si no mejora o si empeora.  Esta informacin no tiene como fin reemplazar el consejo del mdico. Asegrese de hacerle al mdico cualquier pregunta que tenga. Document Released: 12/04/2008 Document Revised: 06/28/2013 Document Reviewed: 05/15/2013 Elsevier Interactive Patient Education  2017 [employer]  

## 2018-08-09 NOTE — Progress Notes (Signed)
Subjective:  Patient ID: Rebecca [name], female    DOB: 10/13/1965  Age: 52 y.o. MRN: [id number]  CC: f/u DM  HPI Rebecca I Floresis a 52 y.o.femalewith a medical history of DM2, diabetic polyneuropathy, HTN, HLD, hyperthyroidism, anemia, right hallux amputations and NSTEMI presents on f/u of DM2. Last A1c 10.8% one month ago while in hospital for acute pyelonephritis. Pt was taken off metformin and had Lantus reduced. Pt has followed hospital instruction since discharge. States she has glucometer readings ranging from 200s to 400s. Only using Novolog 15 units at meal time. Patient also frustrated with her obesity. Says she eats very little every day in an effort to reduce calories. She occasionally walks for 15 minutes for exercise. BP noted to be elevated today. Was taken off of Losartan at the hospital and has not taken since discharge. Last serum creatinine at hospital was 0.92 mg/dL.     Also complains of sore throat and odynophagia since approximately one week ago. Associated with malaise and nasal congestion. Has not taken any medication for relief.     In regards to hyperthyroidism, patient states her endocrinologist has been reducing her doses of medication and reportedly thinks a thyroidectomy is not necessary. Lacks the funds or insurance coverage to return to her endocrinologist in [place]. Does not endorse any thyroid related symptoms or complaints. Not currently taking any thyroid medication.    Outpatient Medications Prior to Visit  Medication Sig Dispense Refill  . aspirin 81 MG EC tablet Take 1 tablet (81 mg total) by mouth daily. 90 tablet 1  . atorvastatin (LIPITOR) 20 MG tablet Take 1 tablet (20 mg total) by mouth at bedtime. 30 tablet 5  . gabapentin (NEURONTIN) 600 MG tablet Take 2 tablets (1,200 mg total) by mouth 3 (three) times daily. 180 tablet 5  . insulin aspart (NOVOLOG) 100 UNIT/ML injection Inject 0-5 Units into the skin at bedtime. 10 mL 0  . Insulin  Glargine (LANTUS SOLOSTAR) 100 UNIT/ML Solostar Pen Inject 60 Units into the skin daily. 15 mL 11  . Insulin Pen Needle (PEN NEEDLES) 31G X 8 MM MISC Inject 1 each into the skin at bedtime. 30 each 11  . methimazole (TAPAZOLE) 5 MG tablet Take 1 tablet (5 mg total) by mouth daily. 90 tablet 3  . nitroGLYCERIN (NITROSTAT) 0.4 MG SL tablet Place 1 tablet (0.4 mg total) under the tongue every 5 (five) minutes as needed for chest pain. 25 tablet 3  . insulin aspart (NOVOLOG) 100 UNIT/ML injection Inject 0-15 Units into the skin 3 (three) times daily with meals. 30 mL 0  . losartan (COZAAR) 50 MG tablet Take 1 tablet (50 mg total) by mouth daily. (Patient not taking: Reported on 08/09/2018) 30 tablet 0  . insulin aspart (NOVOLOG) 100 UNIT/ML injection Inject 2 Units into the skin 3 (three) times daily with meals. 30 mL 0   No facility-administered medications prior to visit.      ROS Review of Systems  Constitutional: Negative for chills, fever and malaise/fatigue.  HENT: Positive for congestion and sore throat.   Eyes: Negative for blurred vision.  Respiratory: Negative for shortness of breath.   Cardiovascular: Negative for chest pain and palpitations.  Gastrointestinal: Negative for abdominal pain and nausea.  Genitourinary: Negative for dysuria and hematuria.  Musculoskeletal: Negative for joint pain and myalgias.  Skin: Negative for rash.  Neurological: Negative for tingling and headaches.  Endo/Heme/Allergies: Positive for polydipsia.  Psychiatric/Behavioral: Negative for depression. The patient is not  nervous/anxious.     Objective:  BP (!) 174/82 (BP Location: Left Arm, Patient Position: Sitting, Cuff Size: Normal)   Pulse 100   Temp 97.6 F (36.4 C) (Oral)   Ht 5\' 2"  (1.575 m)   Wt 184 lb (83.5 kg)   LMP  (LMP Unknown)   SpO2 97%   BMI 33.65 kg/m   BP/Weight 08/09/2018 06/24/2018 06/23/2018  Systolic BP 174 144 -  Diastolic BP 82 80 -  Wt. (Lbs) 184 - 186.29  BMI 33.65 -  34.07      Physical Exam  Constitutional: She is oriented to person, place, and time.  Well developed, obese, NAD, polite  HENT:  Head: Normocephalic and atraumatic.  pharynx moderately erythematous. Tonsils 2+, erythematous and right tonsils with few white exudative lesions.  Eyes: No scleral icterus.  Neck: Normal range of motion. Neck supple. No thyromegaly present.  Cardiovascular: Normal rate, regular rhythm and normal heart sounds.  Pulmonary/Chest: Effort normal and breath sounds normal.  Musculoskeletal: She exhibits no edema.  Lymphadenopathy:    She has cervical adenopathy.  Neurological: She is alert and oriented to person, place, and time.  Skin: Skin is warm and dry. No rash noted. No erythema. No pallor.  Psychiatric: She has a normal mood and affect. Her behavior is normal. Thought content normal.  Vitals reviewed.    Assessment & Plan:   1. Type 2 diabetes mellitus without complication, with long-term current use of insulin (HCC) - metFORMIN (GLUCOPHAGE XR) 500 MG 24 hr tablet; Take 2 tablets (1,000 mg total) by mouth daily with breakfast.  Dispense: 60 tablet; Refill: 5 - sitaGLIPtin (JANUVIA) 100 MG tablet; Take 1 tablet (100 mg total) by mouth daily.  Dispense: 30 tablet; Refill: 5  2. Hypertension, unspecified type - losartan (COZAAR) 50 MG tablet; Take 1 tablet (50 mg total) by mouth daily.  Dispense: 30 tablet; Refill: 5 - aspirin 81 MG EC tablet; Take 1 tablet (81 mg total) by mouth daily.  Dispense: 90 tablet; Refill: 1  3. Hyperthyroidism - Thyroid Panel With TSH  4. Pharyngitis, unspecified etiology - DM-APAP-CPM (CORICIDIN HBP FLU) 15-500-2 MG TABS; Take 2 tablets by mouth every 6 (six) hours for 5 days.  Dispense: 1 each; Refill: 0 - naproxen (NAPROSYN) 500 MG tablet; Take 1 tablet (500 mg total) by mouth 2 (two) times daily with a meal.  Dispense: 30 tablet; Refill: 0 - amoxicillin-clavulanate (AUGMENTIN) 875-125 MG tablet; Take 1 tablet by mouth  2 (two) times daily for 10 days.  Dispense: 20 tablet; Refill: 0  5. Uncontrolled type 2 diabetes mellitus with hyperglycemia (HCC) - Insulin Glargine (LANTUS SOLOSTAR) 100 UNIT/ML Solostar Pen; Inject 60 Units into the skin at bedtime. 60 units at bedtime.  Dispense: 18 mL; Refill: 5 - Insulin Pen Needle (PEN NEEDLES) 31G X 8 MM MISC; Inject 1 each into the skin at bedtime.  Dispense: 30 each; Refill: 5 - gabapentin (NEURONTIN) 600 MG tablet; Take 2 tablets (1,200 mg total) by mouth 3 (three) times daily.  Dispense: 180 tablet; Refill: 5  6. Elevated lipoprotein(a) - atorvastatin (LIPITOR) 20 MG tablet; Take 1 tablet (20 mg total) by mouth at bedtime.  Dispense: 30 tablet; Refill: 5   Meds ordered this encounter  Medications  . metFORMIN (GLUCOPHAGE XR) 500 MG 24 hr tablet    Sig: Take 2 tablets (1,000 mg total) by mouth daily with breakfast.    Dispense:  60 tablet    Refill:  5    Order Specific  Question:   Supervising Provider    Answer:   Hoy RegisterNEWLIN, ENOBONG [[id number]]  . Insulin Glargine (LANTUS SOLOSTAR) 100 UNIT/ML Solostar Pen    Sig: Inject 60 Units into the skin at bedtime. 60 units at bedtime.    Dispense:  18 mL    Refill:  5    Order Specific Question:   Supervising Provider    Answer:   Hoy RegisterNEWLIN, ENOBONG [[id number]]  . Insulin Pen Needle (PEN NEEDLES) 31G X 8 MM MISC    Sig: Inject 1 each into the skin at bedtime.    Dispense:  30 each    Refill:  5    Order Specific Question:   Supervising Provider    Answer:   Hoy RegisterNEWLIN, ENOBONG [[id number]]  . sitaGLIPtin (JANUVIA) 100 MG tablet    Sig: Take 1 tablet (100 mg total) by mouth daily.    Dispense:  30 tablet    Refill:  5    Order Specific Question:   Supervising Provider    Answer:   Hoy RegisterNEWLIN, ENOBONG [[id number]]  . losartan (COZAAR) 50 MG tablet    Sig: Take 1 tablet (50 mg total) by mouth daily.    Dispense:  30 tablet    Refill:  5    Order Specific Question:   Supervising Provider    Answer:   Hoy RegisterNEWLIN, ENOBONG [[id number]]  . gabapentin  (NEURONTIN) 600 MG tablet    Sig: Take 2 tablets (1,200 mg total) by mouth 3 (three) times daily.    Dispense:  180 tablet    Refill:  5    Order Specific Question:   Supervising Provider    Answer:   Hoy RegisterNEWLIN, ENOBONG [[id number]]  . atorvastatin (LIPITOR) 20 MG tablet    Sig: Take 1 tablet (20 mg total) by mouth at bedtime.    Dispense:  30 tablet    Refill:  5    Order Specific Question:   Supervising Provider    Answer:   Hoy RegisterNEWLIN, ENOBONG [[id number]]  . aspirin 81 MG EC tablet    Sig: Take 1 tablet (81 mg total) by mouth daily.    Dispense:  90 tablet    Refill:  1    Order Specific Question:   Supervising Provider    Answer:   Hoy RegisterNEWLIN, ENOBONG [[id number]]  . DM-APAP-CPM (CORICIDIN HBP FLU) 15-500-2 MG TABS    Sig: Take 2 tablets by mouth every 6 (six) hours for 5 days.    Dispense:  1 each    Refill:  0    Order Specific Question:   Supervising Provider    Answer:   Hoy RegisterNEWLIN, ENOBONG [[id number]]  . naproxen (NAPROSYN) 500 MG tablet    Sig: Take 1 tablet (500 mg total) by mouth 2 (two) times daily with a meal.    Dispense:  30 tablet    Refill:  0    Order Specific Question:   Supervising Provider    Answer:   Hoy RegisterNEWLIN, ENOBONG [[id number]]  . amoxicillin-clavulanate (AUGMENTIN) 875-125 MG tablet    Sig: Take 1 tablet by mouth 2 (two) times daily for 10 days.    Dispense:  20 tablet    Refill:  0    Order Specific Question:   Supervising Provider    Answer:   Hoy RegisterNEWLIN, ENOBONG [[id number]]    Follow-up: Return in about 3 months (around 11/09/2018) for DM2.   Rebecca Specteroger David Mckay Brandt PA

## 2018-08-10 ENCOUNTER — Other Ambulatory Visit (INDEPENDENT_AMBULATORY_CARE_PROVIDER_SITE_OTHER): Payer: Self-pay | Admitting: Physician Assistant

## 2018-08-10 DIAGNOSIS — E0590 Thyrotoxicosis, unspecified without thyrotoxic crisis or storm: Secondary | ICD-10-CM

## 2018-08-10 LAB — THYROID PANEL WITH TSH
Free Thyroxine Index: 1.5 (ref 1.2–4.9)
T3 Uptake Ratio: 26 % (ref 24–39)
T4 TOTAL: 5.6 ug/dL (ref 4.5–12.0)
TSH: 3.96 u[IU]/mL (ref 0.450–4.500)

## 2018-08-10 MED ORDER — METHIMAZOLE 5 MG PO TABS: 5.0000 mg | ORAL_TABLET | Freq: Every day | ORAL | 3 refills | Status: DC

## 2018-08-10 MED FILL — LOSARTAN POTASSIUM 50 MG TA: 50 | 30 days supply | Qty: 30 | Fill #0

## 2018-08-10 MED FILL — METFORMIN HCL ER 500 MG TAB: 500 | 30 days supply | Qty: 60 | Fill #2

## 2018-08-10 MED FILL — AMOX-CLAV 875-125 MG TABLET: 875-125 | 10 days supply | Qty: 20 | Fill #0

## 2018-08-10 MED FILL — NAPROXEN 500 MG TABLET: 500 | 15 days supply | Qty: 30 | Fill #0

## 2018-08-10 MED FILL — TRUEplus 5-BEVEL PEN NEEDLE: 31G X 8 MM | 30 days supply | Qty: 100 | Fill #0

## 2018-08-10 MED FILL — methIMAzole 5 MG TABS: 5 | 30 days supply | Qty: 30 | Fill #0

## 2018-08-10 MED FILL — JANUVIA 100 MG TABLET: 100 | 30 days supply | Qty: 30 | Fill #0

## 2018-08-12 ENCOUNTER — Telehealth (INDEPENDENT_AMBULATORY_CARE_PROVIDER_SITE_OTHER): Payer: Self-pay

## 2018-08-12 NOTE — Telephone Encounter (Signed)
-----   Message from Loletta Specteroger David Gomez, PA-C sent at 08/10/2018  2:06 PM EST ----- Thyroid stable. I have sent refill of methimazole to [facility].

## 2018-08-12 NOTE — Telephone Encounter (Signed)
Call placed using pacific interpreter [phone]([phone]) error message that number is restricted or not in service. Called other number on file and left message asking patient to return call to [facility] at [phone]. If patient returns call please inform that her thyroid is stable and [facility].CMA will attempt to call patient once more before mailing results. Rebecca [name], CMA

## 2018-08-17 ENCOUNTER — Telehealth (INDEPENDENT_AMBULATORY_CARE_PROVIDER_SITE_OTHER): Payer: Self-pay

## 2018-08-17 NOTE — Telephone Encounter (Signed)
-----   Message from Roger David Gomez, PA-C sent at 08/10/2018  2:06 PM EST ----- Thyroid stable. I have sent refill of methimazole to [facility]. 

## 2018-08-17 NOTE — Telephone Encounter (Signed)
Call placed using pacific interpreter [phone]([phone]) left voicemail asking patient to return call to [facility] at [phone]. If patient returns call thyroid stable, and [facility]. Maryjean Mornempestt S Roberts, CMA

## 2018-08-26 ENCOUNTER — Telehealth (INDEPENDENT_AMBULATORY_CARE_PROVIDER_SITE_OTHER): Payer: Self-pay

## 2018-08-26 ENCOUNTER — Encounter (INDEPENDENT_AMBULATORY_CARE_PROVIDER_SITE_OTHER): Payer: Self-pay

## 2018-08-26 NOTE — Telephone Encounter (Signed)
-----   Message from Roger David Gomez, PA-C sent at 08/10/2018  2:06 PM EST ----- Thyroid stable. I have sent refill of methimazole to [facility]. 

## 2018-08-26 NOTE — Telephone Encounter (Signed)
Call placed using pacific interpreter [phone]([phone]) left voicemail asking patient to return call to [facility] at [phone]. If patient returns call please inform her that her thyroid is stable and [facility]. Results also mailed to patient. Maryjean Mornempestt S Alizey Noren, CMA

## 2018-08-31 MED FILL — !LANTUS SOLOSTAR 100UNITS/M: 100 | 24 days supply | Qty: 15 | Fill #4

## 2018-08-31 MED FILL — ATORVASTATIN 20 MG TABLET: 20 | 30 days supply | Qty: 30 | Fill #0

## 2018-09-07 MED FILL — LOSARTAN POTASSIUM 50 MG TA: 50 | 30 days supply | Qty: 30 | Fill #1

## 2018-09-07 MED FILL — !JANUVIA 100MG TABLET: 100 | 30 days supply | Qty: 30 | Fill #1

## 2018-09-09 ENCOUNTER — Ambulatory Visit (HOSPITAL_COMMUNITY)
Admission: EM | Admit: 2018-09-09 | Discharge: 2018-09-09 | Disposition: A | Discharge status: Home / Self Care | Payer: No Typology Code available for payment source | Attending: Internal Medicine | Admitting: Internal Medicine

## 2018-09-09 ENCOUNTER — Encounter (HOSPITAL_COMMUNITY): Payer: Self-pay

## 2018-09-09 DIAGNOSIS — G44019 Episodic cluster headache, not intractable: Secondary | ICD-10-CM | POA: Insufficient documentation

## 2018-09-09 DIAGNOSIS — I10 Essential (primary) hypertension: Secondary | ICD-10-CM | POA: Insufficient documentation

## 2018-09-09 NOTE — ED Provider Notes (Addendum)
[facility]    CSN: [id number] Arrival date & time: 09/09/18  1811     History   Chief Complaint Chief Complaint  Patient presents with  . Headache    HPI Rebecca [name] is a 52 y.o. female.    Who present with HA's, pain in her R eye ball and her eyes have been tearing from both eyes, but no purulent matter. Has been sneezing and having post nasal drainage off and on. But has not had a fever.  Her last eye xm > 1 y. Has noticed her vision is slightly more blurry since her last eye xm. Was supposed to see a retinal specialist and since she does not have insurance and she never heard from the referral, she never went to see them. Has noticed that watching TV provoked her R eye pain, but today since she has not watched TV is not mothering her much. She denies tunnel vision, or like a curtain has come in her vision field. Her glucose used to be in the 500's, and now they ran from 100-200, never more than 200. She has been taking her crestor and Januvia at bed time which is what she decided to do on her own.      Past Medical History:  Diagnosis Date  . Anemia   . Diabetes mellitus   . Dyslipidemia   . Hypertension   . Non-STEMI (non-ST elevated myocardial infarction) [facility](HCC)    Medical therapy for distal LAD dissection  . Thyroid disease     Patient Active Problem List   Diagnosis Date Noted  . Bacteremia 06/21/2018  . Sepsis secondary to UTI (HCC) 06/20/2018  . Great toe amputation status, right 08/05/2017  . Mild renal insufficiency 07/19/2017  . AKI (acute kidney injury) (HCC) 03/04/2016  . Essential hypertension 05/14/2015  . CAD- with distal LAD disease, ? 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 05/14/2015  . NSTEMI (non-ST elevated myocardial infarction) (HCC)   . Syncope 05/13/2015  . Hyperglycemia 10/25/2014  . Abrasion of thigh, left 10/25/2014  . DM (diabetes mellitus), type 2, uncontrolled (HCC)   . DM hyperosmolar coma, type 2 (HCC) 05/22/2012  . Hyperosmolar  syndrome 05/21/2012    Past Surgical History:  Procedure Laterality Date  . AMPUTATION Right 07/21/2017   Procedure: RIGHT GREAT TOE AMPUTATION;  Surgeon: Nadara Mustarduda, Marcus V, MD;  [facility];  Service: Orthopedics;  Laterality: Right;  . CARDIAC CATHETERIZATION N/A 05/14/2015   Procedure: Left Heart Cath and Coronary Angiography;  Surgeon: Marykay Lexavid W Harding, MD; dLAD 60%>>95% (small vessel, ?dissection), CFX & RCA systems no sig dz, EF & LVEDP nl       OB History    Gravida  4   Para      Term      Preterm      AB      Living  4     SAB      TAB      Ectopic      Multiple      Live Births  4            Home Medications    Prior to Admission medications   Medication Sig Start Date End Date Taking? Authorizing Provider  aspirin 81 MG EC tablet Take 1 tablet (81 mg total) by mouth daily. 08/09/18   Loletta SpecterGomez, Roger David, PA-C  atorvastatin (LIPITOR) 20 MG tablet Take 1 tablet (20 mg total) by mouth at bedtime. 08/09/18   Loletta SpecterGomez, Roger David, PA-C  gabapentin (NEURONTIN) 600 MG tablet Take 2 tablets (1,200 mg total) by mouth 3 (three) times daily. 08/09/18   Loletta Specter, PA-C  Insulin Glargine (LANTUS SOLOSTAR) 100 UNIT/ML Solostar Pen Inject 60 Units into the skin at bedtime. 60 units at bedtime. 08/09/18   Loletta Specter, PA-C  Insulin Pen Needle (PEN NEEDLES) 31G X 8 MM MISC Inject 1 each into the skin at bedtime. 08/09/18   Loletta Specter, PA-C  losartan (COZAAR) 50 MG tablet Take 1 tablet (50 mg total) by mouth daily. 08/09/18   Loletta Specter, PA-C  metFORMIN (GLUCOPHAGE XR) 500 MG 24 hr tablet Take 2 tablets (1,000 mg total) by mouth daily with breakfast. 08/09/18   Loletta Specter, PA-C  methimazole (TAPAZOLE) 5 MG tablet Take 1 tablet (5 mg total) by mouth daily. 08/10/18   Loletta Specter, PA-C  naproxen (NAPROSYN) 500 MG tablet Take 1 tablet (500 mg total) by mouth 2 (two) times daily with a meal. 08/09/18   Loletta Specter, PA-C    nitroGLYCERIN (NITROSTAT) 0.4 MG SL tablet Place 1 tablet (0.4 mg total) under the tongue every 5 (five) minutes as needed for chest pain. 04/12/18   Loletta Specter, PA-C  sitaGLIPtin (JANUVIA) 100 MG tablet Take 1 tablet (100 mg total) by mouth daily. 08/09/18   Loletta Specter, PA-C    Family History Family History  Problem Relation Age of Onset  . Hypertension Father   . Hyperlipidemia Father   . Cancer Mother   . Diabetes Sister   . Diabetes Brother   . Diabetes Sister   . Diabetes Sister   . Diabetes Sister   . Diabetes Brother   . Diabetes Brother   . Diabetes Brother   . Diabetes Brother     Social History Social History   Tobacco Use  . Smoking status: Never Smoker  . Smokeless tobacco: Never Used  Substance Use Topics  . Alcohol use: No  . Drug use: No     Allergies   Patient has no known allergies.   Review of Systems Review of Systems  Constitutional: Negative for chills, diaphoresis and fever.  HENT: Positive for postnasal drip and sneezing. Negative for ear discharge, ear pain, rhinorrhea, sinus pressure and sinus pain.   Eyes: Positive for pain and discharge. Negative for photophobia, redness, itching and visual disturbance.       Has clear eye discharge  Respiratory: Negative for cough and shortness of breath.   Cardiovascular: Negative for chest pain, palpitations and leg swelling.  Musculoskeletal: Negative for gait problem.  Skin: Negative for rash.  Allergic/Immunologic: Positive for environmental allergies.  Neurological: Positive for numbness and headaches. Negative for dizziness, syncope, facial asymmetry, speech difficulty, weakness and light-headedness.       Has tingling from knees down  Hematological: Negative for adenopathy.  Psychiatric/Behavioral: Negative for sleep disturbance. The patient is not nervous/anxious.        Denies being stressed     Physical Exam Triage Vital Signs ED Triage Vitals  Enc Vitals Group     BP  09/09/18 1835 (!) 151/74     Pulse Rate 09/09/18 1835 98     Resp 09/09/18 1835 20     Temp 09/09/18 1835 98.2 F (36.8 C)     Temp Source 09/09/18 1835 Oral     SpO2 09/09/18 1835 99 %     Weight --      Height --      Head  Circumference --      Peak Flow --      Pain Score 09/09/18 1836 7     Pain Loc --      Pain Edu? --      Excl. in GC? --    No data found.  Updated Vital Signs BP (!) 151/74 (BP Location: Right Arm)   Pulse 98   Temp 98.2 F (36.8 C) (Oral)   Resp 20   LMP  (LMP Unknown)   SpO2 99%  Repeated BP=  138/80     Physical Exam Vitals signs reviewed.  Constitutional:      General: She is not in acute distress.    Appearance: She is obese. She is not toxic-appearing or diaphoretic.  HENT:     Head: Normocephalic and atraumatic.     Comments: Her sinuses are not tender  Eyes:     General: No visual field deficit or scleral icterus.    Extraocular Movements: Extraocular movements intact.     Right eye: No nystagmus.     Pupils: Pupils are equal, round, and reactive to light. Pupils are equal.  Neck:     Musculoskeletal: Neck supple. No neck rigidity.     Comments: Mild tenderness on upper trapezius at the occipital insertion  Cardiovascular:     Rate and Rhythm: Normal rate and regular rhythm.     Heart sounds: No murmur.     Comments: No carotid bruits Pulmonary:     Effort: Pulmonary effort is normal. No respiratory distress.     Breath sounds: No wheezing or rales.  Lymphadenopathy:     Cervical: No cervical adenopathy.  Skin:    General: Skin is warm and dry.     Capillary Refill: Capillary refill takes less than 2 seconds.  Neurological:     Mental Status: She is alert and oriented to person, place, and time.     Cranial Nerves: No cranial nerve deficit or facial asymmetry.     Sensory: No sensory deficit.     Motor: No weakness.     Gait: Gait normal.     Deep Tendon Reflexes: Reflexes normal.  Psychiatric:        Mood and Affect:  Mood normal. Mood is not anxious or depressed.        Speech: Speech normal.        Behavior: Behavior normal. Behavior is not agitated.        Cognition and Memory: Cognition is not impaired. Memory is not impaired.      UC Treatments / Results  Labs (all labs ordered are listed, but only abnormal results are displayed) Labs Reviewed - No data to display  EKG None  Radiology No results found.  Procedures Procedures   Medications Ordered in UC Medications - No data to display  Initial Impression / Assessment and Plan / UC Course  I have reviewed the triage vital signs and the nursing notes. I advised pt to stop taking the Januvia at bed time, I wonder if she may be having some hypoglycemia at night time. She was told to take it in the am. I will also have her take 11/2 of her Cozar q am and do BP diaries to take to her PCP next week. I reviewed symtoms of hypotension and what to watch out for and if develops this and BP is low, to go back to one tab a day. I also gave her information to have her daughter call the [place]  [facility] on Monday and ask if they have some financial assistance. If she has any loss of vision, or tunnel vision or like a curtain is in her files, she needs to go to ER.     Final Clinical Impressions(s) / UC Diagnoses   Final diagnoses:  Essential hypertension  Episodic cluster headache, not intractable     Discharge Instructions     Suba su Cozar/ Losartan y tome  1 1/2 pastillas cada dia.  Llame a su medico el Lunes para que le cheque la [place] para Education officer, museumhacerse chequear su precion y escribala para llevar a su doctor.   Vaya a ver al medico de Retina lo mas pronto possible. Si esta perdiendo la vista, vaya a urgencias medicas.     ED Prescriptions    None     Controlled Substance Prescriptions  Controlled Substance Registry consulted?   Garey HamRodriguez-Southworth, Zyionna Pesce, PA-C 09/09/18  1919  Rodriguez-Southworth, Nettie ElmSylvia, PA-C 09/09/18 1920

## 2018-09-09 NOTE — Discharge Instructions (Addendum)
Suba su Cozar/ Losartan y tome  1 1/2 pastillas cada dia.  Llame a su medico el Lunes para que le cheque la semana [place] para Education officer, museumhacerse chequear su precion y escribala para llevar a su doctor.   Vaya a ver al medico de Retina lo mas pronto possible. Si esta perdiendo la vista, vaya a urgencias medicas.

## 2018-09-09 NOTE — ED Triage Notes (Signed)
Pt presents with ongoing headaches.

## 2018-09-22 MED FILL — methIMAzole 5 MG TABS: 5 | 30 days supply | Qty: 30 | Fill #2

## 2018-09-22 MED FILL — GABAPENTIN 600 MG TABLET: 600 | 30 days supply | Qty: 180 | Fill #3

## 2018-09-22 MED FILL — !LANTUS SOLOSTAR 100UNITS/M: 100 | 24 days supply | Qty: 15 | Fill #5

## 2018-10-05 MED FILL — ATORVASTATIN 20 MG TABLET: 20 | 30 days supply | Qty: 30 | Fill #1

## 2018-10-05 MED FILL — !JANUVIA 100MG TABLET: 100 | 30 days supply | Qty: 30 | Fill #2

## 2018-10-10 MED FILL — LOSARTAN POTASSIUM 50 MG TA: 50 | 30 days supply | Qty: 30 | Fill #2

## 2018-10-24 MED FILL — !LANTUS SOLOSTAR 100UNITS/M: 100 | 24 days supply | Qty: 15 | Fill #6

## 2018-10-31 ENCOUNTER — Encounter (HOSPITAL_COMMUNITY): Payer: Self-pay | Admitting: Emergency Medicine

## 2018-10-31 ENCOUNTER — Emergency Department (HOSPITAL_COMMUNITY)
Admission: EM | Admit: 2018-10-31 | Discharge: 2018-10-31 | Disposition: A | Discharge status: Home / Self Care | Payer: No Typology Code available for payment source | Attending: Emergency Medicine | Admitting: Emergency Medicine

## 2018-10-31 ENCOUNTER — Other Ambulatory Visit: Payer: Self-pay

## 2018-10-31 DIAGNOSIS — I10 Essential (primary) hypertension: Secondary | ICD-10-CM | POA: Insufficient documentation

## 2018-10-31 DIAGNOSIS — R197 Diarrhea, unspecified: Secondary | ICD-10-CM | POA: Insufficient documentation

## 2018-10-31 DIAGNOSIS — Z7982 Long term (current) use of aspirin: Secondary | ICD-10-CM | POA: Insufficient documentation

## 2018-10-31 DIAGNOSIS — Z794 Long term (current) use of insulin: Secondary | ICD-10-CM | POA: Insufficient documentation

## 2018-10-31 DIAGNOSIS — R112 Nausea with vomiting, unspecified: Secondary | ICD-10-CM | POA: Insufficient documentation

## 2018-10-31 DIAGNOSIS — Z79899 Other long term (current) drug therapy: Secondary | ICD-10-CM | POA: Insufficient documentation

## 2018-10-31 DIAGNOSIS — E119 Type 2 diabetes mellitus without complications: Secondary | ICD-10-CM | POA: Insufficient documentation

## 2018-10-31 DIAGNOSIS — N390 Urinary tract infection, site not specified: Secondary | ICD-10-CM | POA: Insufficient documentation

## 2018-10-31 LAB — COMPREHENSIVE METABOLIC PANEL
ALBUMIN: 3.8 g/dL (ref 3.5–5.0)
ALT: 25 U/L (ref 0–44)
AST: 19 U/L (ref 15–41)
Alkaline Phosphatase: 116 U/L (ref 38–126)
Anion gap: 10 (ref 5–15)
BUN: 18 mg/dL (ref 6–20)
CO2: 19 mmol/L — ABNORMAL LOW (ref 22–32)
Calcium: 9.2 mg/dL (ref 8.9–10.3)
Chloride: 109 mmol/L (ref 98–111)
Creatinine, Ser: 0.97 mg/dL (ref 0.44–1.00)
GFR calc Af Amer: >60 mL/min (ref >60)
GFR calc non Af Amer: >60 mL/min (ref >60)
GLUCOSE: 180 mg/dL — AB (ref 70–99)
Potassium: 4.1 mmol/L (ref 3.5–5.1)
SODIUM: 138 mmol/L (ref 135–145)
Total Bilirubin: 0.7 mg/dL (ref 0.3–1.2)
Total Protein: 7.3 g/dL (ref 6.5–8.1)

## 2018-10-31 LAB — URINALYSIS, ROUTINE W REFLEX MICROSCOPIC
Bilirubin Urine: NEGATIVE
Glucose, UA: >=500 mg/dL — AB
KETONES UR: NEGATIVE mg/dL
Nitrite: NEGATIVE
Specific Gravity, Urine: 1.02 (ref 1.005–1.030)
pH: 5 (ref 5.0–8.0)

## 2018-10-31 LAB — CBC
HCT: 36 % (ref 36.0–46.0)
Hemoglobin: 11.7 g/dL — ABNORMAL LOW (ref 12.0–15.0)
MCH: 26.7 pg (ref 26.0–34.0)
MCHC: 32.5 g/dL (ref 30.0–36.0)
MCV: 82 fL (ref 80.0–100.0)
Platelets: 324 10*3/uL (ref 150–400)
RBC: 4.39 MIL/uL (ref 3.87–5.11)
RDW: 12.8 % (ref 11.5–15.5)
WBC: 8.7 10*3/uL (ref 4.0–10.5)
nRBC: 0 % (ref 0.0–0.2)

## 2018-10-31 LAB — I-STAT BETA HCG BLOOD, ED (MC, WL, AP ONLY): I-stat hCG, quantitative: <5 m[IU]/mL (ref <5)

## 2018-10-31 LAB — LIPASE, BLOOD: Lipase: 28 U/L (ref 11–51)

## 2018-10-31 MED ORDER — CEPHALEXIN 500 MG PO CAPS: 500.0000 mg | ORAL_CAPSULE | Freq: Two times a day (BID) | ORAL | 0 refills | Status: DC

## 2018-10-31 MED ORDER — SODIUM CHLORIDE 0.9% FLUSH
3.0000 mL | Freq: Once | INTRAVENOUS | Status: AC
  Administered 2018-10-31: 3 mL via INTRAVENOUS

## 2018-10-31 MED ORDER — LOPERAMIDE HCL 2 MG PO CAPS
4.0000 mg | ORAL_CAPSULE | Freq: Once | ORAL | Status: AC
  Administered 2018-10-31: 4 mg via ORAL
  Filled 2018-10-31: qty 2

## 2018-10-31 MED ORDER — DICYCLOMINE HCL 10 MG/ML IM SOLN
20.0000 mg | Freq: Once | INTRAMUSCULAR | Status: AC
  Administered 2018-10-31: 20 mg via INTRAMUSCULAR
  Filled 2018-10-31: qty 2

## 2018-10-31 MED ORDER — ONDANSETRON 4 MG PO TBDP: 4.0000 mg | ORAL_TABLET | Freq: Four times a day (QID) | ORAL | 0 refills | Status: DC | PRN

## 2018-10-31 MED ORDER — ONDANSETRON HCL 4 MG/2ML IJ SOLN
4.0000 mg | Freq: Once | INTRAMUSCULAR | Status: AC
  Administered 2018-10-31: 4 mg via INTRAVENOUS
  Filled 2018-10-31: qty 2

## 2018-10-31 MED ORDER — SODIUM CHLORIDE 0.9 % IV SOLN
1.0000 g | Freq: Once | INTRAVENOUS | Status: AC
  Administered 2018-10-31: 1 g via INTRAVENOUS
  Filled 2018-10-31: qty 10

## 2018-10-31 MED ORDER — SODIUM CHLORIDE 0.9 % IV BOLUS (SEPSIS)
1000.0000 mL | Freq: Once | INTRAVENOUS | Status: AC
  Administered 2018-10-31: 1000 mL via INTRAVENOUS

## 2018-10-31 MED FILL — ONDANSETRON ODT 4 MG TABLET: 4 | 5 days supply | Qty: 20 | Fill #0

## 2018-10-31 NOTE — ED Provider Notes (Signed)
TIME SEEN: 4:42 AM  CHIEF COMPLAINT: Abdominal pain, vomiting and diarrhea  HPI: Patient is a 53 year old female with history of hypertension, diabetes, dyslipidemia, LAD dissection who presents to the emergency department with 6 days of nausea, vomiting and diarrhea.  Having some lower abdominal heaviness over the suprapubic region.  No fevers or chills.  No dysuria, hematuria, vaginal bleeding or discharge.  No history of abdominal surgeries.  No travel recently, sick contacts, hospitalization or antibiotic use.  Spanish interpreter used.  ROS: See HPI Constitutional: no fever  Eyes: no drainage  ENT: no runny nose   Cardiovascular:  no chest pain  Resp: no SOB  GI: Vomiting and diarrhea GU: no dysuria Integumentary: no rash  Allergy: no hives  Musculoskeletal: no leg swelling  Neurological: no slurred speech ROS otherwise negative  PAST MEDICAL HISTORY/PAST SURGICAL HISTORY:  Past Medical History:  Diagnosis Date  . Anemia   . Diabetes mellitus   . Dyslipidemia   . Hypertension   . Non-STEMI (non-ST elevated myocardial infarction) [facility](HCC)    Medical therapy for distal LAD dissection  . Thyroid disease     MEDICATIONS:  Prior to Admission medications   Medication Sig Start Date End Date Taking? Authorizing Provider  aspirin 81 MG EC tablet Take 1 tablet (81 mg total) by mouth daily. 08/09/18   Loletta SpecterGomez, Roger David, PA-C  atorvastatin (LIPITOR) 20 MG tablet Take 1 tablet (20 mg total) by mouth at bedtime. 08/09/18   Loletta SpecterGomez, Roger David, PA-C  gabapentin (NEURONTIN) 600 MG tablet Take 2 tablets (1,200 mg total) by mouth 3 (three) times daily. 08/09/18   Loletta SpecterGomez, Roger David, PA-C  Insulin Glargine (LANTUS SOLOSTAR) 100 UNIT/ML Solostar Pen Inject 60 Units into the skin at bedtime. 60 units at bedtime. 08/09/18   Loletta SpecterGomez, Roger David, PA-C  Insulin Pen Needle (PEN NEEDLES) 31G X 8 MM MISC Inject 1 each into the skin at bedtime. 08/09/18   Loletta SpecterGomez, Roger David, PA-C  losartan (COZAAR) 50  MG tablet Take 1 tablet (50 mg total) by mouth daily. 08/09/18   Loletta SpecterGomez, Roger David, PA-C  metFORMIN (GLUCOPHAGE XR) 500 MG 24 hr tablet Take 2 tablets (1,000 mg total) by mouth daily with breakfast. 08/09/18   Loletta SpecterGomez, Roger David, PA-C  methimazole (TAPAZOLE) 5 MG tablet Take 1 tablet (5 mg total) by mouth daily. 08/10/18   Loletta SpecterGomez, Roger David, PA-C  naproxen (NAPROSYN) 500 MG tablet Take 1 tablet (500 mg total) by mouth 2 (two) times daily with a meal. 08/09/18   Loletta SpecterGomez, Roger David, PA-C  nitroGLYCERIN (NITROSTAT) 0.4 MG SL tablet Place 1 tablet (0.4 mg total) under the tongue every 5 (five) minutes as needed for chest pain. 04/12/18   Loletta SpecterGomez, Roger David, PA-C  sitaGLIPtin (JANUVIA) 100 MG tablet Take 1 tablet (100 mg total) by mouth daily. 08/09/18   Loletta SpecterGomez, Roger David, PA-C    ALLERGIES:  No Known Allergies  SOCIAL HISTORY:  Social History   Tobacco Use  . Smoking status: Never Smoker  . Smokeless tobacco: Never Used  Substance Use Topics  . Alcohol use: No    FAMILY HISTORY: Family History  Problem Relation Age of Onset  . Hypertension Father   . Hyperlipidemia Father   . Cancer Mother   . Diabetes Sister   . Diabetes Brother   . Diabetes Sister   . Diabetes Sister   . Diabetes Sister   . Diabetes Brother   . Diabetes Brother   . Diabetes Brother   . Diabetes Brother  EXAM: BP (!) 153/75 (BP Location: Right Arm)   Pulse 95   Temp 98.6 F (37 C) (Oral)   Resp 16   Ht 5\' 4"  (1.626 m)   Wt 83.9 kg   LMP  (LMP Unknown)   SpO2 100%   BMI 31.76 kg/m  CONSTITUTIONAL: Alert and oriented and responds appropriately to questions. Well-appearing; well-nourished HEAD: Normocephalic EYES: Conjunctivae clear, pupils appear equal, EOMI ENT: normal nose; moist mucous membranes NECK: Supple, no meningismus, no nuchal rigidity, no LAD  CARD: RRR; S1 and S2 appreciated; no murmurs, no clicks, no rubs, no gallops RESP: Normal chest excursion without splinting or tachypnea;  breath sounds clear and equal bilaterally; no wheezes, no rhonchi, no rales, no hypoxia or respiratory distress, speaking full sentences ABD/GI: Normal bowel sounds; non-distended; soft, minimally tender in the suprapubic area with no tenderness at McBurney's point and negative Murphy sign, no rebound, no guarding, no peritoneal signs, no hepatosplenomegaly BACK:  The back appears normal and is non-tender to palpation, there is no CVA tenderness EXT: Normal ROM in all joints; non-tender to palpation; no edema; normal capillary refill; no cyanosis, no calf tenderness or swelling    SKIN: Normal color for age and race; warm; no rash NEURO: Moves all extremities equally PSYCH: The patient's mood and manner are appropriate. Grooming and personal hygiene are appropriate.  MEDICAL DECISION MAKING: Patient here with vomiting and diarrhea for the past 6 days.  Mildly tender over the suprapubic region and urine appears to be possibly infected.  We will send urine culture and start her on antibiotics.  No tenderness at McBurney's point.  Doubt appendicitis.  Negative Murphy sign.  Doubt cholecystitis.  Otherwise abdominal exam benign.  Labs reassuring.  She does have mildly decreased bicarb but normal anion gap and no ketones in her urine.  I do not think this is DKA.  Will treat symptomatically with IV fluids, Zofran, Imodium, Bentyl.  ED PROGRESS: Patient reports feeling much better.  Nausea has resolved.  I feel she is safe to be discharged home.  Will discharge with Zofran, Keflex.  Recommended over-the-counter medications for symptomatic relief and bland diet.  Discussed return precautions.  Patient and family at bedside verbalized understanding and are comfortable with plan.   At this time, I do not feel there is any life-threatening condition present. I have reviewed and discussed all results (EKG, imaging, lab, urine as appropriate) and exam findings with patient/family. I have reviewed nursing notes and  appropriate previous records.  I feel the patient is safe to be discharged home without further emergent workup and can continue workup as an outpatient as needed. Discussed usual and customary return precautions. Patient/family verbalize understanding and are comfortable with this plan.  Outpatient follow-up has been provided as needed. All questions have been answered.      Keily Lepp, Layla Maw, DO 10/31/18 [phone]

## 2018-10-31 NOTE — ED Notes (Signed)
ED Provider at bedside. 

## 2018-10-31 NOTE — Discharge Instructions (Signed)
You may alternate Tylenol 1000 mg every 6 hours as needed for pain and Ibuprofen 800 mg every 8 hours as needed for pain.  Please take Ibuprofen with food. ° °You may use over-the-counter Imodium as needed for diarrhea. °

## 2018-10-31 NOTE — ED Triage Notes (Signed)
Patient with n/v/d that started earlier yesterday around 2pm.  Patient has not been able to eat but has been able to keep Pedialyte down.  Patient states the pain is constant and burning sensation in her abdomen.  She last vomited about 30 mins ago.

## 2018-10-31 NOTE — ED Notes (Signed)
Family at bedside. 

## 2018-11-03 MED FILL — methIMAzole 5 MG TABS: 5 | 30 days supply | Qty: 30 | Fill #3

## 2018-11-07 MED FILL — !JANUVIA 100MG TABLET: 100 | 30 days supply | Qty: 30 | Fill #3

## 2018-11-08 ENCOUNTER — Ambulatory Visit (INDEPENDENT_AMBULATORY_CARE_PROVIDER_SITE_OTHER): Payer: No Typology Code available for payment source | Admitting: Family Medicine

## 2018-11-08 MED FILL — LOSARTAN POTASSIUM 50 MG TA: 50 | 30 days supply | Qty: 30 | Fill #3

## 2018-11-14 MED FILL — GABAPENTIN 600 MG TABLET: 600 | 30 days supply | Qty: 180 | Fill #4

## 2018-11-14 MED FILL — ATORVASTATIN 20 MG TABLET: 20 | 30 days supply | Qty: 30 | Fill #2

## 2018-11-17 MED FILL — !LANTUS SOLOSTAR 100UNITS/M: 100 | 14 days supply | Qty: 9 | Fill #7

## 2018-11-23 ENCOUNTER — Inpatient Hospital Stay (INDEPENDENT_AMBULATORY_CARE_PROVIDER_SITE_OTHER): Payer: Self-pay | Admitting: Primary Care

## 2018-11-26 ENCOUNTER — Inpatient Hospital Stay (HOSPITAL_COMMUNITY)
Admission: EM | Admit: 2018-11-26 | Discharge: 2018-11-28 | DRG: 392 | Disposition: A | Discharge status: Home / Self Care | Payer: Self-pay | Source: Ambulatory Visit | Attending: Internal Medicine | Admitting: Internal Medicine

## 2018-11-26 ENCOUNTER — Other Ambulatory Visit: Payer: Self-pay

## 2018-11-26 ENCOUNTER — Encounter (HOSPITAL_COMMUNITY): Payer: Self-pay

## 2018-11-26 ENCOUNTER — Ambulatory Visit (HOSPITAL_COMMUNITY)
Admission: EM | Admit: 2018-11-26 | Discharge: 2018-11-26 | Disposition: A | Discharge status: Home / Self Care | Payer: Self-pay | Attending: Family Medicine | Admitting: Family Medicine

## 2018-11-26 DIAGNOSIS — R519 Headache, unspecified: Secondary | ICD-10-CM

## 2018-11-26 DIAGNOSIS — Z8249 Family history of ischemic heart disease and other diseases of the circulatory system: Secondary | ICD-10-CM

## 2018-11-26 DIAGNOSIS — N182 Chronic kidney disease, stage 2 (mild): Secondary | ICD-10-CM | POA: Diagnosis present

## 2018-11-26 DIAGNOSIS — Z8619 Personal history of other infectious and parasitic diseases: Secondary | ICD-10-CM

## 2018-11-26 DIAGNOSIS — E1165 Type 2 diabetes mellitus with hyperglycemia: Secondary | ICD-10-CM | POA: Diagnosis present

## 2018-11-26 DIAGNOSIS — Z79899 Other long term (current) drug therapy: Secondary | ICD-10-CM

## 2018-11-26 DIAGNOSIS — R197 Diarrhea, unspecified: Secondary | ICD-10-CM

## 2018-11-26 DIAGNOSIS — Z9119 Patient's noncompliance with other medical treatment and regimen: Secondary | ICD-10-CM

## 2018-11-26 DIAGNOSIS — E119 Type 2 diabetes mellitus without complications: Secondary | ICD-10-CM

## 2018-11-26 DIAGNOSIS — Z833 Family history of diabetes mellitus: Secondary | ICD-10-CM

## 2018-11-26 DIAGNOSIS — IMO0002 Reserved for concepts with insufficient information to code with codable children: Secondary | ICD-10-CM | POA: Diagnosis present

## 2018-11-26 DIAGNOSIS — N179 Acute kidney failure, unspecified: Secondary | ICD-10-CM | POA: Diagnosis present

## 2018-11-26 DIAGNOSIS — Z794 Long term (current) use of insulin: Secondary | ICD-10-CM

## 2018-11-26 DIAGNOSIS — I252 Old myocardial infarction: Secondary | ICD-10-CM

## 2018-11-26 DIAGNOSIS — I10 Essential (primary) hypertension: Secondary | ICD-10-CM | POA: Diagnosis present

## 2018-11-26 DIAGNOSIS — N760 Acute vaginitis: Secondary | ICD-10-CM | POA: Diagnosis present

## 2018-11-26 DIAGNOSIS — N898 Other specified noninflammatory disorders of vagina: Secondary | ICD-10-CM

## 2018-11-26 DIAGNOSIS — R51 Headache: Secondary | ICD-10-CM

## 2018-11-26 DIAGNOSIS — E860 Dehydration: Secondary | ICD-10-CM | POA: Diagnosis present

## 2018-11-26 DIAGNOSIS — I129 Hypertensive chronic kidney disease with stage 1 through stage 4 chronic kidney disease, or unspecified chronic kidney disease: Secondary | ICD-10-CM | POA: Diagnosis present

## 2018-11-26 DIAGNOSIS — E875 Hyperkalemia: Secondary | ICD-10-CM | POA: Diagnosis present

## 2018-11-26 DIAGNOSIS — E0590 Thyrotoxicosis, unspecified without thyrotoxic crisis or storm: Secondary | ICD-10-CM

## 2018-11-26 DIAGNOSIS — K5909 Other constipation: Secondary | ICD-10-CM | POA: Diagnosis present

## 2018-11-26 DIAGNOSIS — A09 Infectious gastroenteritis and colitis, unspecified: Principal | ICD-10-CM | POA: Diagnosis present

## 2018-11-26 DIAGNOSIS — E1142 Type 2 diabetes mellitus with diabetic polyneuropathy: Secondary | ICD-10-CM | POA: Diagnosis present

## 2018-11-26 DIAGNOSIS — R42 Dizziness and giddiness: Secondary | ICD-10-CM

## 2018-11-26 DIAGNOSIS — E785 Hyperlipidemia, unspecified: Secondary | ICD-10-CM | POA: Diagnosis present

## 2018-11-26 DIAGNOSIS — Z89411 Acquired absence of right great toe: Secondary | ICD-10-CM

## 2018-11-26 DIAGNOSIS — E1122 Type 2 diabetes mellitus with diabetic chronic kidney disease: Secondary | ICD-10-CM | POA: Diagnosis present

## 2018-11-26 DIAGNOSIS — I2510 Atherosclerotic heart disease of native coronary artery without angina pectoris: Secondary | ICD-10-CM | POA: Diagnosis present

## 2018-11-26 DIAGNOSIS — Z7982 Long term (current) use of aspirin: Secondary | ICD-10-CM

## 2018-11-26 DIAGNOSIS — IMO0001 Reserved for inherently not codable concepts without codable children: Secondary | ICD-10-CM

## 2018-11-26 DIAGNOSIS — I951 Orthostatic hypotension: Secondary | ICD-10-CM

## 2018-11-26 DIAGNOSIS — Z8349 Family history of other endocrine, nutritional and metabolic diseases: Secondary | ICD-10-CM

## 2018-11-26 LAB — BASIC METABOLIC PANEL
ANION GAP: 10 (ref 5–15)
BUN: 33 mg/dL — ABNORMAL HIGH (ref 6–20)
CO2: 22 mmol/L (ref 22–32)
Calcium: 9.4 mg/dL (ref 8.9–10.3)
Chloride: 101 mmol/L (ref 98–111)
Creatinine, Ser: 1.65 mg/dL — ABNORMAL HIGH (ref 0.44–1.00)
GFR calc Af Amer: 41 mL/min — ABNORMAL LOW (ref >60)
GFR calc non Af Amer: 35 mL/min — ABNORMAL LOW (ref >60)
GLUCOSE: 390 mg/dL — AB (ref 70–99)
Potassium: 5.5 mmol/L — ABNORMAL HIGH (ref 3.5–5.1)
Sodium: 133 mmol/L — ABNORMAL LOW (ref 135–145)

## 2018-11-26 LAB — GLUCOSE, CAPILLARY: Glucose-Capillary: 362 mg/dL — ABNORMAL HIGH (ref 70–99)

## 2018-11-26 LAB — WET PREP, GENITAL
Clue Cells Wet Prep HPF POC: NONE SEEN
Sperm: NONE SEEN
Trich, Wet Prep: NONE SEEN
Yeast Wet Prep HPF POC: NONE SEEN

## 2018-11-26 LAB — CBC
HCT: 38.8 % (ref 36.0–46.0)
Hemoglobin: 12.8 g/dL (ref 12.0–15.0)
MCH: 27.2 pg (ref 26.0–34.0)
MCHC: 33 g/dL (ref 30.0–36.0)
MCV: 82.4 fL (ref 80.0–100.0)
Platelets: 339 10*3/uL (ref 150–400)
RBC: 4.71 MIL/uL (ref 3.87–5.11)
RDW: 12.7 % (ref 11.5–15.5)
WBC: 10.1 10*3/uL (ref 4.0–10.5)
nRBC: 0 % (ref 0.0–0.2)

## 2018-11-26 MED ORDER — SODIUM CHLORIDE 0.9% FLUSH
3.0000 mL | Freq: Once | INTRAVENOUS | Status: AC
  Administered 2018-11-26: 3 mL via INTRAVENOUS

## 2018-11-26 MED ORDER — SODIUM CHLORIDE 0.9 % IV BOLUS
1000.0000 mL | Freq: Once | INTRAVENOUS | Status: AC
  Administered 2018-11-26: 1000 mL via INTRAVENOUS

## 2018-11-26 NOTE — ED Triage Notes (Signed)
Pt present vaginal discharge and light headness.  Symptoms started over a 1 week ago.

## 2018-11-26 NOTE — ED Notes (Signed)
Pt unable to give urine specimen at this time 

## 2018-11-26 NOTE — ED Provider Notes (Signed)
[facility]    CSN: [id number] Arrival date & time: 11/26/18  1306     History   Chief Complaint Chief Complaint  Patient presents with  . Vaginal Discharge  . Dizziness    HPI Rebecca [name] is a 53 y.o. female.   Rebecca [name] presents with complaints of "blurry" sensation with standing, a sensation of "slanting to the side," and headache. States she has had this for some time now but worse and constant over the past 8 days. Denies previous history of headaches or migraines. States this is the worst headache she has ever had. She feels fatigued and weak. No shortness of breath. No jaw or arm pain. No diaphoresis. States she gets chest pain with activity, denies any current chest pain. Dizziness worse with activity but states she does feel it while at rest now. No current nausea, states had vomiting when she was treated for pyelonephritis 2/10. She feels pain to the front of her head and like her "eyes are going to explode." hasn't taken any medications for pain. She rates the pain 3/10. No head injury, no fevers, no neck pain. No light sensitivity. No falls. States she is having vaginal itching as well. Hx of DM, doesn't check her blood sugars. Hx of htn and NSTEMI. Hx of thyroid disease.    Spanish video interpreter used to collect history and physical exam.    ROS per HPI, negative if not otherwise mentioned.      Past Medical History:  Diagnosis Date  . Anemia   . Diabetes mellitus   . Dyslipidemia   . Hypertension   . Non-STEMI (non-ST elevated myocardial infarction) [facility])    Medical therapy for distal LAD dissection  . Thyroid disease     Patient Active Problem List   Diagnosis Date Noted  . Bacteremia 06/21/2018  . Sepsis secondary to UTI (HCC) 06/20/2018  . Great toe amputation status, right 08/05/2017  . Mild renal insufficiency 07/19/2017  . AKI (acute kidney injury) (HCC) 03/04/2016  . Essential hypertension 05/14/2015  . CAD- with distal LAD  disease, ? 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 05/14/2015  . NSTEMI (non-ST elevated myocardial infarction) (HCC)   . Syncope 05/13/2015  . Hyperglycemia 10/25/2014  . Abrasion of thigh, left 10/25/2014  . DM (diabetes mellitus), type 2, uncontrolled (HCC)   . DM hyperosmolar coma, type 2 (HCC) 05/22/2012  . Hyperosmolar syndrome 05/21/2012    Past Surgical History:  Procedure Laterality Date  . AMPUTATION Right 07/21/2017   Procedure: RIGHT GREAT TOE AMPUTATION;  Surgeon: Nadara Mustard, MD;  Location: [facility] OR;  Service: Orthopedics;  Laterality: Right;  . CARDIAC CATHETERIZATION N/A 05/14/2015   Procedure: Left Heart Cath and Coronary Angiography;  Surgeon: Marykay Lex, MD; dLAD 60%>>95% (small vessel, ?dissection), CFX & RCA systems no sig dz, EF & LVEDP nl       OB History    Gravida  4   Para      Term      Preterm      AB      Living  4     SAB      TAB      Ectopic      Multiple      Live Births  4            Home Medications    Prior to Admission medications   Medication Sig Start Date End Date Taking? Authorizing Provider  aspirin 81 MG EC tablet Take 1 tablet (  81 mg total) by mouth daily. 08/09/18   Loletta Specter, PA-C  atorvastatin (LIPITOR) 20 MG tablet Take 1 tablet (20 mg total) by mouth at bedtime. 08/09/18   Loletta Specter, PA-C  cephALEXin (KEFLEX) 500 MG capsule Take 1 capsule (500 mg total) by mouth 2 (two) times daily. 10/31/18   Ward, Layla Maw, DO  gabapentin (NEURONTIN) 600 MG tablet Take 2 tablets (1,200 mg total) by mouth 3 (three) times daily. 08/09/18   Loletta Specter, PA-C  Insulin Glargine (LANTUS SOLOSTAR) 100 UNIT/ML Solostar Pen Inject 60 Units into the skin at bedtime. 60 units at bedtime. Patient taking differently: Inject 60 Units into the skin at bedtime.  08/09/18   Loletta Specter, PA-C  Insulin Pen Needle (PEN NEEDLES) 31G X 8 MM MISC Inject 1 each into the skin at bedtime. 08/09/18   Loletta Specter, PA-C  losartan  (COZAAR) 50 MG tablet Take 1 tablet (50 mg total) by mouth daily. 08/09/18   Loletta Specter, PA-C  metFORMIN (GLUCOPHAGE XR) 500 MG 24 hr tablet Take 2 tablets (1,000 mg total) by mouth daily with breakfast. 08/09/18   Loletta Specter, PA-C  methimazole (TAPAZOLE) 5 MG tablet Take 1 tablet (5 mg total) by mouth daily. 08/10/18   Loletta Specter, PA-C  naproxen (NAPROSYN) 500 MG tablet Take 1 tablet (500 mg total) by mouth 2 (two) times daily with a meal. Patient not taking: Reported on 10/31/2018 08/09/18   Loletta Specter, PA-C  nitroGLYCERIN (NITROSTAT) 0.4 MG SL tablet Place 1 tablet (0.4 mg total) under the tongue every 5 (five) minutes as needed for chest pain. 04/12/18   Loletta Specter, PA-C  ondansetron (ZOFRAN ODT) 4 MG disintegrating tablet Take 1 tablet (4 mg total) by mouth every 6 (six) hours as needed. 10/31/18   Ward, Layla Maw, DO  sitaGLIPtin (JANUVIA) 100 MG tablet Take 1 tablet (100 mg total) by mouth daily. 08/09/18   Loletta Specter, PA-C    Family History Family History  Problem Relation Age of Onset  . Hypertension Father   . Hyperlipidemia Father   . Cancer Mother   . Diabetes Sister   . Diabetes Brother   . Diabetes Sister   . Diabetes Sister   . Diabetes Sister   . Diabetes Brother   . Diabetes Brother   . Diabetes Brother   . Diabetes Brother     Social History Social History   Tobacco Use  . Smoking status: Never Smoker  . Smokeless tobacco: Never Used  Substance Use Topics  . Alcohol use: No  . Drug use: No     Allergies   Patient has no known allergies.   Review of Systems Review of Systems   Physical Exam Triage Vital Signs ED Triage Vitals  Enc Vitals Group     BP 11/26/18 1334 138/66     Pulse Rate 11/26/18 1334 98     Resp 11/26/18 1334 16     Temp 11/26/18 1334 97.9 F (36.6 C)     Temp Source 11/26/18 1334 Oral     SpO2 11/26/18 1334 100 %     Weight --      Height --      Head Circumference --      Peak  Flow --      Pain Score 11/26/18 1335 3     Pain Loc --      Pain Edu? --      Excl.  in GC? --    No data found.  Updated Vital Signs BP 138/66 (BP Location: Right Arm)   Pulse 98   Temp 97.9 F (36.6 C) (Oral)   Resp 16   LMP  (LMP Unknown)   SpO2 100%    Physical Exam Constitutional:      General: She is not in acute distress.    Appearance: She is well-developed.  Cardiovascular:     Rate and Rhythm: Normal rate and regular rhythm.     Heart sounds: Normal heart sounds.  Pulmonary:     Effort: Pulmonary effort is normal.     Breath sounds: Normal breath sounds.  Skin:    General: Skin is warm and dry.  Neurological:     Mental Status: She is alert and oriented to person, place, and time.     Cranial Nerves: Cranial nerves are intact.     Sensory: Sensation is intact.     Motor: Motor function is intact.     Coordination: Coordination is intact.    EKG: NSR rate 98. Previous EKG was available for review without acute changes. No stwave changes as interpreted by me.    UC Treatments / Results  Labs (all labs ordered are listed, but only abnormal results are displayed) Labs Reviewed - No data to display  EKG None  Radiology No results found.  Procedures Procedures (including critical care time)  Medications Ordered in UC Medications - No data to display  Initial Impression / Assessment and Plan / UC Course  I have reviewed the triage vital signs and the nursing notes.  Pertinent labs & imaging results that were available during my care of the patient were reviewed by me and considered in my medical decision making (see chart for details).     No acute neurological findings on exam. Patient is ambulatory. Alert, oriented. States has had symptoms for months? But worse over the past week. Denies migraine history or previous headaches and does state this is the worse headache she has ever had. Has not taken any medications for her symptoms, however.  Dizziness at rest. Without history of migraines feel that she warrants further evaluation to safely diagnose headache as source of symptoms. Assistance provided to ER.   Final Clinical Impressions(s) / UC Diagnoses   Final diagnoses:  Dizziness  Acute nonintractable headache, unspecified headache type     Discharge Instructions     Please go to the ER for further evaluation and treatment.    ED Prescriptions    None     Controlled Substance Prescriptions [place] Controlled Substance Registry consulted? Not Applicable   Georgetta Haber, NP 11/26/18 1439

## 2018-11-26 NOTE — Discharge Instructions (Signed)
Please go to the ER for further evaluation and treatment 

## 2018-11-26 NOTE — ED Triage Notes (Signed)
Using spanish interpreter- pt c/o headache dizziness and vaginal itching. Was sent here from UC. Symptoms ongoing X1 week.

## 2018-11-26 NOTE — H&P (Signed)
History and Physical   Rebecca [name] KGY:[id number] DOB: 1966/01/28 DOA: 11/26/2018  Referring MD/NP/PA: Dr. Denton Lank  PCP: Loletta Specter, PA-C   Outpatient Specialists: None  Patient coming from: Home via urgent care  Chief Complaint: Vaginal discharge, persistent diarrhea and dizziness  HPI: Rebecca [name] is a 53 y.o. female Spanish-speaking only with medical history significant of diabetes, hypertension, pyelonephritis, hyperlipidemia, recent treatment with antibiotics since 10 February, who went to the urgent care center with complaint of vaginal discharge, dizziness and weakness.  She has had mild nausea with no vomiting but persistent diarrhea for the last 3 days.  Patient has been on antibiotics for close to 2 weeks.  She denied any fever or chills.  Patient was seen in the ER and evaluated.  She appears to have bacterial vaginosis with evidence of dehydration, acute kidney injury and hyperkalemia.  Patient suspected to have had prerenal azotemia from dehydration.  She has had fatigue and headaches.  Patient suspected to have infectious diarrhea possibly C. difficile colitis.  She is being admitted for medical management..  ED Course: Temperature 97.9 blood pressure 179/81 with pulse 86 respiratory rate of 21 oxygen sat 98% room air.  White count 10.1 and hemoglobin 12.8 platelets 339.  Sodium is 133 potassium 5.5 chloride 101 CO2 22 BUN 33 creatinine 1.65 calcium 9.4 glucose 390.  Pelvic exam done showed no yeast only WBCs on wet prep no clue cells and no trichomonas.  Patient received IV fluid resuscitation and is being admitted for treatment.  Review of Systems: As per HPI otherwise 10 point review of systems negative.    Past Medical History:  Diagnosis Date  . Anemia   . Diabetes mellitus   . Dyslipidemia   . Hypertension   . Non-STEMI (non-ST elevated myocardial infarction) [facility])    Medical therapy for distal LAD dissection  . Thyroid disease     Past Surgical  History:  Procedure Laterality Date  . AMPUTATION Right 07/21/2017   Procedure: RIGHT GREAT TOE AMPUTATION;  Surgeon: Nadara Mustard, MD;  Location: [facility] OR;  Service: Orthopedics;  Laterality: Right;  . CARDIAC CATHETERIZATION N/A 05/14/2015   Procedure: Left Heart Cath and Coronary Angiography;  Surgeon: Marykay Lex, MD; dLAD 60%>>95% (small vessel, ?dissection), CFX & RCA systems no sig dz, EF & LVEDP nl        reports that she has never smoked. She has never used smokeless tobacco. She reports that she does not drink alcohol or use drugs.  No Known Allergies  Family History  Problem Relation Age of Onset  . Hypertension Father   . Hyperlipidemia Father   . Cancer Mother   . Diabetes Sister   . Diabetes Brother   . Diabetes Sister   . Diabetes Sister   . Diabetes Sister   . Diabetes Brother   . Diabetes Brother   . Diabetes Brother   . Diabetes Brother      Prior to Admission medications   Medication Sig Start Date End Date Taking? Authorizing Provider  aspirin 81 MG EC tablet Take 1 tablet (81 mg total) by mouth daily. 08/09/18   Loletta Specter, PA-C  atorvastatin (LIPITOR) 20 MG tablet Take 1 tablet (20 mg total) by mouth at bedtime. 08/09/18   Loletta Specter, PA-C  cephALEXin (KEFLEX) 500 MG capsule Take 1 capsule (500 mg total) by mouth 2 (two) times daily. 10/31/18   Ward, Layla Maw, DO  gabapentin (NEURONTIN) 600 MG tablet Take 2  tablets (1,200 mg total) by mouth 3 (three) times daily. 08/09/18   Loletta Specter, PA-C  Insulin Glargine (LANTUS SOLOSTAR) 100 UNIT/ML Solostar Pen Inject 60 Units into the skin at bedtime. 60 units at bedtime. Patient taking differently: Inject 60 Units into the skin at bedtime.  08/09/18   Loletta Specter, PA-C  Insulin Pen Needle (PEN NEEDLES) 31G X 8 MM MISC Inject 1 each into the skin at bedtime. 08/09/18   Loletta Specter, PA-C  losartan (COZAAR) 50 MG tablet Take 1 tablet (50 mg total) by mouth daily. 08/09/18    Loletta Specter, PA-C  metFORMIN (GLUCOPHAGE XR) 500 MG 24 hr tablet Take 2 tablets (1,000 mg total) by mouth daily with breakfast. 08/09/18   Loletta Specter, PA-C  methimazole (TAPAZOLE) 5 MG tablet Take 1 tablet (5 mg total) by mouth daily. 08/10/18   Loletta Specter, PA-C  naproxen (NAPROSYN) 500 MG tablet Take 1 tablet (500 mg total) by mouth 2 (two) times daily with a meal. Patient not taking: Reported on 10/31/2018 08/09/18   Loletta Specter, PA-C  nitroGLYCERIN (NITROSTAT) 0.4 MG SL tablet Place 1 tablet (0.4 mg total) under the tongue every 5 (five) minutes as needed for chest pain. 04/12/18   Loletta Specter, PA-C  ondansetron (ZOFRAN ODT) 4 MG disintegrating tablet Take 1 tablet (4 mg total) by mouth every 6 (six) hours as needed. 10/31/18   Ward, Layla Maw, DO  sitaGLIPtin (JANUVIA) 100 MG tablet Take 1 tablet (100 mg total) by mouth daily. 08/09/18   Loletta Specter, PA-C    Physical Exam: Vitals:   11/26/18 1730 11/26/18 1800 11/26/18 1830 11/26/18 2214  BP: 126/79 136/88 (!) 156/80 (!) 179/81  Pulse: 86 87 92 90  Resp: (!) 0 20 11 (!) 21  Temp:    97.9 F (36.6 C)  TempSrc:    Oral  SpO2: 98% 100% 100% 100%      Constitutional: NAD, calm, comfortable Vitals:   11/26/18 1730 11/26/18 1800 11/26/18 1830 11/26/18 2214  BP: 126/79 136/88 (!) 156/80 (!) 179/81  Pulse: 86 87 92 90  Resp: (!) 0 20 11 (!) 21  Temp:    97.9 F (36.6 C)  TempSrc:    Oral  SpO2: 98% 100% 100% 100%   Eyes: PERRL, lids and conjunctivae dry ENMT: Mucous membranes are moist. Posterior pharynx clear of any exudate or lesions.Normal dentition.  Neck: normal, supple, no masses, no thyromegaly Respiratory: clear to auscultation bilaterally, no wheezing, no crackles. Normal respiratory effort. No accessory muscle use.  Cardiovascular: Regular rate and rhythm, no murmurs / rubs / gallops. No extremity edema. 2+ pedal pulses. No carotid bruits.  Abdomen: Diffuse tenderness, no masses  palpated. No hepatosplenomegaly. Bowel sounds positive.  Musculoskeletal: no clubbing / cyanosis. No joint deformity upper and lower extremities. Good ROM, no contractures. Normal muscle tone.  Skin: no rashes, lesions, ulcers. No induration Neurologic: CN 2-12 grossly intact. Sensation intact, DTR normal. Strength 5/5 in all 4.  Psychiatric: Normal judgment and insight. Alert and oriented x 3. Normal mood.     Labs on Admission: I have personally reviewed following labs and imaging studies  CBC: Recent Labs  Lab 11/26/18 1504  WBC 10.1  HGB 12.8  HCT 38.8  MCV 82.4  PLT 339   Basic Metabolic Panel: Recent Labs  Lab 11/26/18 1504  NA 133*  K 5.5*  CL 101  CO2 22  GLUCOSE 390*  BUN 33*  CREATININE 1.65*  CALCIUM 9.4   GFR: CrCl cannot be calculated (Unknown ideal weight.). Liver Function Tests: No results for input(s): AST, ALT, ALKPHOS, BILITOT, PROT, ALBUMIN in the last 168 hours. No results for input(s): LIPASE, AMYLASE in the last 168 hours. No results for input(s): AMMONIA in the last 168 hours. Coagulation Profile: No results for input(s): INR, PROTIME in the last 168 hours. Cardiac Enzymes: No results for input(s): CKTOTAL, CKMB, CKMBINDEX, TROPONINI in the last 168 hours. BNP (last 3 results) No results for input(s): PROBNP in the last 8760 hours. HbA1C: No results for input(s): HGBA1C in the last 72 hours. CBG: Recent Labs  Lab 11/26/18 2227  GLUCAP 362*   Lipid Profile: No results for input(s): CHOL, HDL, LDLCALC, TRIG, CHOLHDL, LDLDIRECT in the last 72 hours. Thyroid Function Tests: No results for input(s): TSH, T4TOTAL, FREET4, T3FREE, THYROIDAB in the last 72 hours. Anemia Panel: No results for input(s): VITAMINB12, FOLATE, FERRITIN, TIBC, IRON, RETICCTPCT in the last 72 hours. Urine analysis:    Component Value Date/Time   COLORURINE YELLOW 10/31/2018 0130   APPEARANCEUR HAZY (A) 10/31/2018 0130   LABSPEC 1.020 10/31/2018 0130   PHURINE  5.0 10/31/2018 0130   GLUCOSEU >=500 (A) 10/31/2018 0130   HGBUR SMALL (A) 10/31/2018 0130   BILIRUBINUR NEGATIVE 10/31/2018 0130   KETONESUR NEGATIVE 10/31/2018 0130   PROTEINUR >=300 (A) 10/31/2018 0130   UROBILINOGEN 0.2 05/13/2015 0411   NITRITE NEGATIVE 10/31/2018 0130   LEUKOCYTESUR SMALL (A) 10/31/2018 0130   Sepsis Labs: @LABRCNTIP (procalcitonin:4,lacticidven:4) ) Recent Results (from the past 240 hour(s))  Wet prep, genital     Status: Abnormal   Collection Time: 11/26/18  9:42 PM  Result Value Ref Range Status   Yeast Wet Prep HPF POC NONE SEEN NONE SEEN Final   Trich, Wet Prep NONE SEEN NONE SEEN Final   Clue Cells Wet Prep HPF POC NONE SEEN NONE SEEN Final   WBC, Wet Prep HPF POC MANY (A) NONE SEEN Final   Sperm NONE SEEN  Final    Comment: Performed at [facility], [address] [place], [place] [address]     Radiological Exams on Admission: No results found.  EKG: Independently reviewed.  EKG shows normal sinus rhythm with a rate of 93, no significant ST changes  Assessment/Plan Principal Problem:   Infectious diarrhea Active Problems:   DM (diabetes mellitus), type 2, uncontrolled (HCC)   Essential hypertension   CAD- with distal LAD disease, ? 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   AKI (acute kidney injury) (HCC)   Hyperkalemia     #1 diarrhea: Infectious diarrhea most likely.  Supportive IV fluid resuscitation.  Stool for C. difficile and other pathogens.  If negative will use Imodium and other agents to control diarrhea.  We will hold off on antibiotics until results are back.  #2 diabetes: Appears uncontrolled.  Patient has not been compliant.  Continue her Lantus insulin with sliding scale insulin.  Also holding metformin.  #3 acute kidney injury: Monitor renal function closely.  #4 hypertension: Continue with home regimen.  #5 coronary artery disease: Stable at baseline.  #6 hyperkalemia: May need Kayexalate.  Insulin also with D50 if potassium keeps  rising.   DVT prophylaxis: Heparin Code Status: Full code Family Communication: No family available to talk to patient through interpreter Disposition Plan: Home Consults called: None Admission status: Inpatient  Severity of Illness: The appropriate patient status for this patient is INPATIENT. Inpatient status is judged to be reasonable and necessary in order to provide the  required intensity of service to ensure the patient's safety. The patient's presenting symptoms, physical exam findings, and initial radiographic and laboratory data in the context of their chronic comorbidities is felt to place them at high risk for further clinical deterioration. Furthermore, it is not anticipated that the patient will be medically stable for discharge from the hospital within 2 midnights of admission. The following factors support the patient status of inpatient.   " The patient's presenting symptoms include diarrhea with dizziness. " The worrisome physical exam findings include mucosal dryness and mild diffuse tenderness of the abdomen. " The initial radiographic and laboratory data are worrisome because of evidence of acute kidney injury and hyperkalemia. " The chronic co-morbidities include diabetes with hypertension.   * I certify that at the point of admission it is my clinical judgment that the patient will require inpatient hospital care spanning beyond 2 midnights from the point of admission due to high intensity of service, high risk for further deterioration and high frequency of surveillance required.Lonia Blood MD [facility] Pager [phone]  If 7PM-7AM, please contact night-coverage [email or web address] Password [facility]  11/26/2018, 11:07 PM

## 2018-11-26 NOTE — ED Notes (Signed)
ED TO INPATIENT HANDOFF REPORT  ED Nurse Name and Phone #: [phone]  S Name/Age/Gender Rebecca [name] 53 y.o. female Room/Bed: H013C/H013C  Code Status   Code Status: Prior  Home/SNF/Other Home Patient oriented to: self, place, time and situation Is this baseline? Yes   Triage Complete: Triage complete  Chief Complaint vaginal discharge  Triage Note Using spanish interpreter- pt c/o headache dizziness and vaginal itching. Was sent here from [facility]. Symptoms ongoing X1 week.    Allergies No Known Allergies  Level of Care/Admitting Diagnosis ED Disposition    ED Disposition Condition Comment   Admit  Hospital [facility]: [facility] [[id number]]  Level of Care: Med-Surg [16]  Diagnosis: Infectious diarrhea [[id number]]  Admitting Physician: Rometta Emery [[id number]]  Attending Physician: Rometta Emery [[id number]]  Estimated length of stay: past midnight tomorrow  Certification:: I certify this patient will need inpatient services for at least 2 midnights  PT Class (Do Not Modify): Inpatient [101]  PT Acc Code (Do Not Modify): Private [1]       B Medical/Surgery History Past Medical History:  Diagnosis Date  . Anemia   . Diabetes mellitus   . Dyslipidemia   . Hypertension   . Non-STEMI (non-ST elevated myocardial infarction) [facility])    Medical therapy for distal LAD dissection  . Thyroid disease    Past Surgical History:  Procedure Laterality Date  . AMPUTATION Right 07/21/2017   Procedure: RIGHT GREAT TOE AMPUTATION;  Surgeon: Nadara Mustard, MD;  Location: [facility] OR;  Service: Orthopedics;  Laterality: Right;  . CARDIAC CATHETERIZATION N/A 05/14/2015   Procedure: Left Heart Cath and Coronary Angiography;  Surgeon: Marykay Lex, MD; dLAD 60%>>95% (small vessel, ?dissection), CFX & RCA systems no sig dz, EF & LVEDP nl        A IV Location/Drains/Wounds Patient Lines/Drains/Airways Status   Active Line/Drains/Airways    Name:   Placement date:    Placement time:   Site:   Days:   Peripheral IV 11/26/18 Left Antecubital   11/26/18    1825    Antecubital   less than 1   Incision (Closed) 07/21/17 Foot Right   07/21/17    0928     493   Wound / Incision (Open or Dehisced) 07/19/17 Diabetic ulcer Right ulceration redness and swelling of right great toe    07/19/17    1000    -   495          Intake/Output Last 24 hours  Intake/Output Summary (Last 24 hours) at 11/26/2018 2148 Last data filed at 11/26/2018 1935 Gross per 24 hour  Intake 1000 ml  Output -  Net 1000 ml    Labs/Imaging Results for orders placed or performed during the hospital encounter of 11/26/18 (from the past 48 hour(s))  Basic metabolic panel     Status: Abnormal   Collection Time: 11/26/18  3:04 PM  Result Value Ref Range   Sodium 133 (L) 135 - 145 mmol/L   Potassium 5.5 (H) 3.5 - 5.1 mmol/L   Chloride 101 98 - 111 mmol/L   CO2 22 22 - 32 mmol/L   Glucose, Bld 390 (H) 70 - 99 mg/dL   BUN 33 (H) 6 - 20 mg/dL   Creatinine, Ser 4.54 (H) 0.44 - 1.00 mg/dL   Calcium 9.4 8.9 - 09.8 mg/dL   GFR calc non Af Amer 35 (L) >60 mL/min   GFR calc Af Amer 41 (L) >60 mL/min  Anion gap 10 5 - 15    Comment: Performed at [facility], [address], [place], [place] [address]  CBC     Status: None   Collection Time: 11/26/18  3:04 PM  Result Value Ref Range   WBC 10.1 4.0 - 10.5 K/uL   RBC 4.71 3.87 - 5.11 MIL/uL   Hemoglobin 12.8 12.0 - 15.0 g/dL   HCT 70.0 17.4 - 94.4 %   MCV 82.4 80.0 - 100.0 fL   MCH 27.2 26.0 - 34.0 pg   MCHC 33.0 30.0 - 36.0 g/dL   RDW 96.7 59.1 - 63.8 %   Platelets 339 150 - 400 K/uL   nRBC 0.0 0.0 - 0.2 %    Comment: Performed at [facility], [address], [place], [place] [address]   No results found.  Pending Labs Unresulted Labs (From admission, onward)    Start     Ordered   11/26/18 2147  Wet prep, genital  ONCE - STAT,   STAT     11/26/18 2147   11/26/18 1459  Urinalysis, Routine w reflex microscopic  Once,    STAT     11/26/18 1458          Vitals/Pain Today's Vitals   11/26/18 1800 11/26/18 1830 11/26/18 1855 11/26/18 1935  BP: 136/88 (!) 156/80    Pulse: 87 92    Resp: 20 11    Temp:      TempSrc:      SpO2: 100% 100%    PainSc:   0-No pain 0-No pain    Isolation Precautions No active isolations  Medications Medications  sodium chloride flush (NS) 0.9 % injection 3 mL (3 mLs Intravenous Given 11/26/18 1824)  sodium chloride 0.9 % bolus 1,000 mL (0 mLs Intravenous Stopped 11/26/18 1935)    Mobility walks Low fall risk   Focused Assessments dizziness   R Recommendations: See Admitting Provider Note  Report given to:   Additional Notes: n/a

## 2018-11-26 NOTE — ED Provider Notes (Signed)
[facility] EMERGENCY DEPARTMENT Provider Note   CSN: [id number] Arrival date & time: 11/26/18  1449    History   Chief Complaint No chief complaint on file.   HPI Rebecca [name] is a 53 y.o. female.      Diarrhea  Quality:  Semi-solid Severity:  Moderate Onset quality:  Gradual Duration:  5 days Timing:  Constant Progression:  Unchanged Relieved by:  Nothing Worsened by:  Nothing Ineffective treatments:  None tried Associated symptoms: no abdominal pain, no arthralgias, no chills, no recent cough, no diaphoresis, no fever, no headaches, no myalgias, no URI and no vomiting   Risk factors: recent antibiotic use     Past Medical History:  Diagnosis Date  . Anemia   . Diabetes mellitus   . Dyslipidemia   . Hypertension   . Non-STEMI (non-ST elevated myocardial infarction) [facility])    Medical therapy for distal LAD dissection  . Thyroid disease     Patient Active Problem List   Diagnosis Date Noted  . Infectious diarrhea 11/26/2018  . Hyperkalemia 11/26/2018  . Bacteremia 06/21/2018  . Sepsis secondary to UTI (HCC) 06/20/2018  . Great toe amputation status, right 08/05/2017  . Mild renal insufficiency 07/19/2017  . AKI (acute kidney injury) (HCC) 03/04/2016  . Essential hypertension 05/14/2015  . CAD- with distal LAD disease, ? 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 05/14/2015  . NSTEMI (non-ST elevated myocardial infarction) (HCC)   . Syncope 05/13/2015  . Hyperglycemia 10/25/2014  . Abrasion of thigh, left 10/25/2014  . DM (diabetes mellitus), type 2, uncontrolled (HCC)   . DM hyperosmolar coma, type 2 (HCC) 05/22/2012  . Hyperosmolar syndrome 05/21/2012    Past Surgical History:  Procedure Laterality Date  . AMPUTATION Right 07/21/2017   Procedure: RIGHT GREAT TOE AMPUTATION;  Surgeon: Nadara Mustard, MD;  Location: [facility] OR;  Service: Orthopedics;  Laterality: Right;  . CARDIAC CATHETERIZATION N/A 05/14/2015   Procedure: Left Heart Cath and Coronary  Angiography;  Surgeon: Marykay Lex, MD; dLAD 60%>>95% (small vessel, ?dissection), CFX & RCA systems no sig dz, EF & LVEDP nl        OB History    Gravida  4   Para      Term      Preterm      AB      Living  4     SAB      TAB      Ectopic      Multiple      Live Births  4            Home Medications    Prior to Admission medications   Medication Sig Start Date End Date Taking? Authorizing Provider  aspirin 81 MG EC tablet Take 1 tablet (81 mg total) by mouth daily. 08/09/18   Loletta Specter, PA-C  atorvastatin (LIPITOR) 20 MG tablet Take 1 tablet (20 mg total) by mouth at bedtime. 08/09/18   Loletta Specter, PA-C  cephALEXin (KEFLEX) 500 MG capsule Take 1 capsule (500 mg total) by mouth 2 (two) times daily. 10/31/18   Ward, Layla Maw, DO  gabapentin (NEURONTIN) 600 MG tablet Take 2 tablets (1,200 mg total) by mouth 3 (three) times daily. 08/09/18   Loletta Specter, PA-C  Insulin Glargine (LANTUS SOLOSTAR) 100 UNIT/ML Solostar Pen Inject 60 Units into the skin at bedtime. 60 units at bedtime. Patient taking differently: Inject 60 Units into the skin at bedtime.  08/09/18   Loletta Specter,  PA-C  Insulin Pen Needle (PEN NEEDLES) 31G X 8 MM MISC Inject 1 each into the skin at bedtime. 08/09/18   Loletta Specter, PA-C  losartan (COZAAR) 50 MG tablet Take 1 tablet (50 mg total) by mouth daily. 08/09/18   Loletta Specter, PA-C  metFORMIN (GLUCOPHAGE XR) 500 MG 24 hr tablet Take 2 tablets (1,000 mg total) by mouth daily with breakfast. 08/09/18   Loletta Specter, PA-C  methimazole (TAPAZOLE) 5 MG tablet Take 1 tablet (5 mg total) by mouth daily. 08/10/18   Loletta Specter, PA-C  naproxen (NAPROSYN) 500 MG tablet Take 1 tablet (500 mg total) by mouth 2 (two) times daily with a meal. Patient not taking: Reported on 10/31/2018 08/09/18   Loletta Specter, PA-C  nitroGLYCERIN (NITROSTAT) 0.4 MG SL tablet Place 1 tablet (0.4 mg total) under the  tongue every 5 (five) minutes as needed for chest pain. 04/12/18   Loletta Specter, PA-C  ondansetron (ZOFRAN ODT) 4 MG disintegrating tablet Take 1 tablet (4 mg total) by mouth every 6 (six) hours as needed. 10/31/18   Ward, Layla Maw, DO  sitaGLIPtin (JANUVIA) 100 MG tablet Take 1 tablet (100 mg total) by mouth daily. 08/09/18   Loletta Specter, PA-C    Family History Family History  Problem Relation Age of Onset  . Hypertension Father   . Hyperlipidemia Father   . Cancer Mother   . Diabetes Sister   . Diabetes Brother   . Diabetes Sister   . Diabetes Sister   . Diabetes Sister   . Diabetes Brother   . Diabetes Brother   . Diabetes Brother   . Diabetes Brother     Social History Social History   Tobacco Use  . Smoking status: Never Smoker  . Smokeless tobacco: Never Used  Substance Use Topics  . Alcohol use: No  . Drug use: No     Allergies   Patient has no known allergies.   Review of Systems Review of Systems  Constitutional: Negative for chills, diaphoresis and fever.  HENT: Negative for ear pain and sore throat.   Eyes: Negative for pain and visual disturbance.  Respiratory: Negative for cough and shortness of breath.   Cardiovascular: Negative for chest pain and palpitations.  Gastrointestinal: Positive for diarrhea. Negative for abdominal pain and vomiting.  Genitourinary: Negative for dysuria and hematuria.  Musculoskeletal: Negative for arthralgias, back pain and myalgias.  Skin: Negative for color change and rash.  Neurological: Negative for seizures, syncope and headaches.  All other systems reviewed and are negative.    Physical Exam Updated Vital Signs BP (!) 179/81 (BP Location: Right Arm)   Pulse 90   Temp 97.9 F (36.6 C) (Oral)   Resp (!) 21   Wt 75.8 kg   LMP  (LMP Unknown)   SpO2 100%   BMI 28.70 kg/m   Physical Exam Vitals signs and nursing note reviewed. Exam conducted with a chaperone present.  Constitutional:       General: She is not in acute distress.    Appearance: She is well-developed.     Comments: Patient resting quietly, in no acute distress.  Interpreter utilized throughout the visit.  HENT:     Head: Normocephalic and atraumatic.  Eyes:     Conjunctiva/sclera: Conjunctivae normal.  Neck:     Musculoskeletal: Neck supple.  Cardiovascular:     Rate and Rhythm: Normal rate and regular rhythm.     Heart sounds: No murmur.  Pulmonary:     Effort: Pulmonary effort is normal. No respiratory distress.     Breath sounds: Normal breath sounds.  Abdominal:     Palpations: Abdomen is soft.     Tenderness: There is no abdominal tenderness.  Genitourinary:    General: Normal vulva.     Pubic Area: No rash or pubic lice.      Labia:        Right: No rash, tenderness, lesion or injury.        Left: No rash, tenderness, lesion or injury.      Vagina: Normal.     Cervix: Normal.     Comments: Only mild vaginal discharge, does not appear chunky white, no strawberry cervix.  Possibly normal discharge based on exam.   Musculoskeletal: Normal range of motion.  Skin:    General: Skin is warm and dry.     Capillary Refill: Capillary refill takes less than 2 seconds.  Neurological:     General: No focal deficit present.     Mental Status: She is alert and oriented to person, place, and time. Mental status is at baseline.     Cranial Nerves: No cranial nerve deficit.     Sensory: No sensory deficit.     Motor: No weakness.     Coordination: Coordination normal.     Gait: Gait normal.     Deep Tendon Reflexes: Reflexes normal.  Psychiatric:        Mood and Affect: Mood normal.      ED Treatments / Results  Labs (all labs ordered are listed, but only abnormal results are displayed) Labs Reviewed  WET PREP, GENITAL - Abnormal; Notable for the following components:      Result Value   WBC, Wet Prep HPF POC MANY (*)    All other components within normal limits  BASIC METABOLIC PANEL - Abnormal;  Notable for the following components:   Sodium 133 (*)    Potassium 5.5 (*)    Glucose, Bld 390 (*)    BUN 33 (*)    Creatinine, Ser 1.65 (*)    GFR calc non Af Amer 35 (*)    GFR calc Af Amer 41 (*)    All other components within normal limits  GLUCOSE, CAPILLARY - Abnormal; Notable for the following components:   Glucose-Capillary 362 (*)    All other components within normal limits  C DIFFICILE QUICK SCREEN W PCR REFLEX  CBC  URINALYSIS, ROUTINE W REFLEX MICROSCOPIC  HIV ANTIBODY (ROUTINE TESTING W REFLEX)  COMPREHENSIVE METABOLIC PANEL  CBC  TSH  CBG MONITORING, ED  GC/CHLAMYDIA PROBE AMP ([place]) NOT AT [facility]    EKG EKG Interpretation  Date/Time:  Saturday November 26 2018 15:10:44 EST Ventricular Rate:  93 PR Interval:  142 QRS Duration: 84 QT Interval:  362 QTC Calculation: 450 R Axis:   90 Text Interpretation:  Normal sinus rhythm Rightward axis Abnormal ECG Confirmed by Blane Ohara [id number]) on 11/26/2018 5:15:32 PM   Radiology No results found.  Procedures Procedures (including critical care time)  Medications Ordered in ED Medications  nitroGLYCERIN (NITROSTAT) SL tablet 0.4 mg (has no administration in time range)  insulin glargine (LANTUS) injection 60 Units (60 Units Subcutaneous Given 11/27/18 0043)  linagliptin (TRADJENTA) tablet 5 mg (has no administration in time range)  losartan (COZAAR) tablet 50 mg (has no administration in time range)  gabapentin (NEURONTIN) tablet 1,200 mg (1,200 mg Oral Given 11/27/18 0042)  atorvastatin (LIPITOR) tablet 20 mg (  20 mg Oral Given 11/27/18 0043)  aspirin chewable tablet 81 mg (has no administration in time range)  methimazole (TAPAZOLE) tablet 5 mg (has no administration in time range)  ondansetron (ZOFRAN-ODT) disintegrating tablet 4 mg (has no administration in time range)  insulin aspart (novoLOG) injection 0-9 Units (has no administration in time range)  insulin aspart (novoLOG) injection 0-5 Units (0 Units  Subcutaneous Not Given 11/27/18 0019)  heparin injection 5,000 Units (5,000 Units Subcutaneous Given 11/27/18 0043)  0.9 %  sodium chloride infusion ( Intravenous New Bag/Given 11/27/18 0047)  ondansetron (ZOFRAN) tablet 4 mg (has no administration in time range)    Or  ondansetron (ZOFRAN) injection 4 mg (has no administration in time range)  sodium chloride flush (NS) 0.9 % injection 3 mL (3 mLs Intravenous Given 11/26/18 1824)  sodium chloride 0.9 % bolus 1,000 mL (0 mLs Intravenous Stopped 11/26/18 1935)     Initial Impression / Assessment and Plan / ED Course  I have reviewed the triage vital signs and the nursing notes.  Pertinent labs & imaging results that were available during my care of the patient were reviewed by me and considered in my medical decision making (see chart for details).        53 y.o. female with medical history significant of hyperthyroidism; CAD (distal LAD dissection, medical therapy only); HTN; HLD; and DM presenting with diarrhea for the past 5 days leading to decreased p.o. intake, dizziness-like symptoms.  Patient denies any other infectious-like symptoms, denies any pain on urination, chest pain, shortness of breath, cough.  Patient afebrile on arrival.  Patient did have antibiotic therapy previously for UTI within the last 6 weeks.  Concern for viral gastritis, antibiotic associated diarrhea, food poisoning, other causes of diarrhea.  Dizziness is been intermittent, no neurological symptoms, no headache, no vision change.  Physical exam negative for any neurological findings.  No indication for CT imaging at this time.  Patient on ROS did describe some vaginal discharge; possibly normal based on exam, no acute findings; laboratory studies sent based on collection.  Likely result after patient's admitted.   Patient is laboratory studies indicate hyperglycemia, elevated potassium.  EKG shows no hyperacute T waves, appropriate intervals, no signs of ischemia.  Will give  IV fluids and admit for dehydration, mild AKI in the setting of likely prerenal from diarrhea.  Patient in agreement with this plan.  Inpatient team is in a work-up for likely C. difficile and other etiologies.  Patient admitted in stable condition with stable vital signs.  The above care was discussed and agreed upon by my attending physician.  Final Clinical Impressions(s) / ED Diagnoses   Final diagnoses:  Hyperkalemia  Diarrhea, unspecified type    ED Discharge Orders    None       Dahlia Client, MD 11/27/18 1610    Blane Ohara, MD 11/28/18 [phone]

## 2018-11-27 DIAGNOSIS — IMO0001 Reserved for inherently not codable concepts without codable children: Secondary | ICD-10-CM

## 2018-11-27 DIAGNOSIS — Z794 Long term (current) use of insulin: Secondary | ICD-10-CM

## 2018-11-27 DIAGNOSIS — N898 Other specified noninflammatory disorders of vagina: Secondary | ICD-10-CM

## 2018-11-27 DIAGNOSIS — I2510 Atherosclerotic heart disease of native coronary artery without angina pectoris: Secondary | ICD-10-CM

## 2018-11-27 DIAGNOSIS — N179 Acute kidney failure, unspecified: Secondary | ICD-10-CM

## 2018-11-27 DIAGNOSIS — I951 Orthostatic hypotension: Secondary | ICD-10-CM

## 2018-11-27 DIAGNOSIS — E119 Type 2 diabetes mellitus without complications: Secondary | ICD-10-CM

## 2018-11-27 LAB — CBC
HEMATOCRIT: 36.5 % (ref 36.0–46.0)
Hemoglobin: 11.9 g/dL — ABNORMAL LOW (ref 12.0–15.0)
MCH: 26.7 pg (ref 26.0–34.0)
MCHC: 32.6 g/dL (ref 30.0–36.0)
MCV: 81.8 fL (ref 80.0–100.0)
Platelets: 286 10*3/uL (ref 150–400)
RBC: 4.46 MIL/uL (ref 3.87–5.11)
RDW: 12.8 % (ref 11.5–15.5)
WBC: 7.3 10*3/uL (ref 4.0–10.5)
nRBC: 0 % (ref 0.0–0.2)

## 2018-11-27 LAB — URINALYSIS, ROUTINE W REFLEX MICROSCOPIC
BILIRUBIN URINE: NEGATIVE
Glucose, UA: >=500 mg/dL — AB
Ketones, ur: NEGATIVE mg/dL
Nitrite: NEGATIVE
Protein, ur: 30 mg/dL — AB
Specific Gravity, Urine: 1.02 (ref 1.005–1.030)
pH: 6 (ref 5.0–8.0)

## 2018-11-27 LAB — COMPREHENSIVE METABOLIC PANEL
ALT: 24 U/L (ref 0–44)
AST: 21 U/L (ref 15–41)
Albumin: 3.3 g/dL — ABNORMAL LOW (ref 3.5–5.0)
Alkaline Phosphatase: 130 U/L — ABNORMAL HIGH (ref 38–126)
Anion gap: 4 — ABNORMAL LOW (ref 5–15)
BUN: 23 mg/dL — ABNORMAL HIGH (ref 6–20)
CHLORIDE: 107 mmol/L (ref 98–111)
CO2: 25 mmol/L (ref 22–32)
Calcium: 8.9 mg/dL (ref 8.9–10.3)
Creatinine, Ser: 1.17 mg/dL — ABNORMAL HIGH (ref 0.44–1.00)
GFR calc Af Amer: >60 mL/min (ref >60)
GFR calc non Af Amer: 54 mL/min — ABNORMAL LOW (ref >60)
GLUCOSE: 396 mg/dL — AB (ref 70–99)
Potassium: 4.1 mmol/L (ref 3.5–5.1)
Sodium: 136 mmol/L (ref 135–145)
Total Bilirubin: 0.6 mg/dL (ref 0.3–1.2)
Total Protein: 6.8 g/dL (ref 6.5–8.1)

## 2018-11-27 LAB — GLUCOSE, CAPILLARY
Glucose-Capillary: 294 mg/dL — ABNORMAL HIGH (ref 70–99)
Glucose-Capillary: 169 mg/dL — ABNORMAL HIGH (ref 70–99)
Glucose-Capillary: 185 mg/dL — ABNORMAL HIGH (ref 70–99)
Glucose-Capillary: 305 mg/dL — ABNORMAL HIGH (ref 70–99)

## 2018-11-27 LAB — HIV ANTIBODY (ROUTINE TESTING W REFLEX): HIV Screen 4th Generation wRfx: NONREACTIVE

## 2018-11-27 LAB — TSH: TSH: 5.512 u[IU]/mL — ABNORMAL HIGH (ref 0.350–4.500)

## 2018-11-27 MED ORDER — INSULIN ASPART 100 UNIT/ML ~~LOC~~ SOLN
0.0000 [IU] | Freq: Three times a day (TID) | SUBCUTANEOUS | Status: DC
  Administered 2018-11-27: 2 [IU] via SUBCUTANEOUS
  Administered 2018-11-27: 5 [IU] via SUBCUTANEOUS
  Administered 2018-11-27: 2 [IU] via SUBCUTANEOUS
  Administered 2018-11-28: 3 [IU] via SUBCUTANEOUS

## 2018-11-27 MED ORDER — ONDANSETRON HCL 4 MG PO TABS: 4.0000 mg | ORAL_TABLET | Freq: Four times a day (QID) | ORAL | Status: DC | PRN

## 2018-11-27 MED ORDER — GABAPENTIN 600 MG PO TABS
1200.0000 mg | ORAL_TABLET | Freq: Three times a day (TID) | ORAL | Status: DC
  Administered 2018-11-27 – 2018-11-28 (×5): 1200 mg via ORAL
  Filled 2018-11-27 (×5): qty 2

## 2018-11-27 MED ORDER — ONDANSETRON 4 MG PO TBDP: 4.0000 mg | ORAL_TABLET | Freq: Four times a day (QID) | ORAL | Status: DC | PRN

## 2018-11-27 MED ORDER — CLOTRIMAZOLE 1 % VA CREA
1.0000 | TOPICAL_CREAM | Freq: Every day | VAGINAL | Status: DC
  Administered 2018-11-27: 1 via VAGINAL
  Filled 2018-11-27: qty 45

## 2018-11-27 MED ORDER — POLYETHYLENE GLYCOL 3350 17 G PO PACK
17.0000 g | PACK | Freq: Every day | ORAL | Status: DC
  Administered 2018-11-27 – 2018-11-28 (×2): 17 g via ORAL
  Filled 2018-11-27 (×2): qty 1

## 2018-11-27 MED ORDER — ONDANSETRON HCL 4 MG/2ML IJ SOLN: 4.0000 mg | Freq: Four times a day (QID) | INTRAMUSCULAR | Status: DC | PRN

## 2018-11-27 MED ORDER — METHIMAZOLE 5 MG PO TABS
5.0000 mg | ORAL_TABLET | Freq: Every day | ORAL | Status: DC
  Administered 2018-11-27 – 2018-11-28 (×2): 5 mg via ORAL
  Filled 2018-11-27 (×2): qty 1

## 2018-11-27 MED ORDER — LINAGLIPTIN 5 MG PO TABS
5.0000 mg | ORAL_TABLET | Freq: Every day | ORAL | Status: DC
  Administered 2018-11-27 – 2018-11-28 (×2): 5 mg via ORAL
  Filled 2018-11-27 (×2): qty 1

## 2018-11-27 MED ORDER — LOSARTAN POTASSIUM 50 MG PO TABS
50.0000 mg | ORAL_TABLET | Freq: Every day | ORAL | Status: DC
  Administered 2018-11-27 – 2018-11-28 (×2): 50 mg via ORAL
  Filled 2018-11-27 (×2): qty 1

## 2018-11-27 MED ORDER — INSULIN GLARGINE 100 UNIT/ML ~~LOC~~ SOLN
60.0000 [IU] | Freq: Every day | SUBCUTANEOUS | Status: DC
  Administered 2018-11-27 (×2): 60 [IU] via SUBCUTANEOUS
  Filled 2018-11-27 (×2): qty 0.6

## 2018-11-27 MED ORDER — INSULIN ASPART 100 UNIT/ML ~~LOC~~ SOLN
0.0000 [IU] | Freq: Every day | SUBCUTANEOUS | Status: DC
  Administered 2018-11-27: 4 [IU] via SUBCUTANEOUS

## 2018-11-27 MED ORDER — ASPIRIN 81 MG PO CHEW
81.0000 mg | CHEWABLE_TABLET | Freq: Every day | ORAL | Status: DC
  Administered 2018-11-27 – 2018-11-28 (×2): 81 mg via ORAL
  Filled 2018-11-27 (×2): qty 1

## 2018-11-27 MED ORDER — HEPARIN SODIUM (PORCINE) 5000 UNIT/ML IJ SOLN
5000.0000 [IU] | Freq: Three times a day (TID) | INTRAMUSCULAR | Status: DC
  Administered 2018-11-27 – 2018-11-28 (×5): 5000 [IU] via SUBCUTANEOUS
  Filled 2018-11-27 (×4): qty 1

## 2018-11-27 MED ORDER — SENNOSIDES-DOCUSATE SODIUM 8.6-50 MG PO TABS
1.0000 | ORAL_TABLET | Freq: Two times a day (BID) | ORAL | Status: DC
  Administered 2018-11-27 – 2018-11-28 (×3): 1 via ORAL
  Filled 2018-11-27 (×3): qty 1

## 2018-11-27 MED ORDER — SODIUM CHLORIDE 0.9 % IV SOLN
INTRAVENOUS | Status: DC
  Administered 2018-11-27 (×2): via INTRAVENOUS

## 2018-11-27 MED ORDER — NITROGLYCERIN 0.4 MG SL SUBL: 0.4000 mg | SUBLINGUAL_TABLET | SUBLINGUAL | Status: DC | PRN

## 2018-11-27 MED ORDER — SODIUM CHLORIDE 0.9 % IV SOLN
INTRAVENOUS | Status: DC
  Administered 2018-11-27 (×2): via INTRAVENOUS

## 2018-11-27 MED ORDER — ATORVASTATIN CALCIUM 10 MG PO TABS
20.0000 mg | ORAL_TABLET | Freq: Every day | ORAL | Status: DC
  Administered 2018-11-27 (×2): 20 mg via ORAL
  Filled 2018-11-27 (×2): qty 2

## 2018-11-27 NOTE — Progress Notes (Signed)
PROGRESS NOTE  Rebecca VERBEEK WUJ:[id number] DOB: [birth year] DOA: 11/26/2018 PCP: Loletta Specter, PA-C  HPI/Recap of past 24 hours:  Dizziness resolved after hydration, left lower quadrant ab pain resolved ,had a small formed bm No fever, no n/v + vaginal itch, + vaginal discharge for the last two weeks Daughter at bedside  Assessment/Plan: Principal Problem:   Infectious diarrhea Active Problems:   DM (diabetes mellitus), type 2, uncontrolled (HCC)   Essential hypertension   CAD- with distal LAD disease, ? 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   AKI (acute kidney injury) (HCC)   Hyperkalemia    Diarrhea -due to recent abx use, initially concerns for c diff, however she does not appear septic, no leukocytosis, no fever,   -she reports chronic constipation, started to have intermittent loose stool last two weeks -she currently does not have ab pain -will treat as overflow diarrhea in the setting of constipation, start stool softener  Vaginal discharge: Wet prep negative for yeast, trich or clues cells, does has many wbc Topical clotrimazole daily for 7 days  Dizziness: likely from orthostasis from dehydration Dizziness resolved after hydration  AKI on CKDII: -ua no infection, aki likely due to dehydration -cr improved on hydration, still not baseline , will continue hydration for another 24hrs  Insulin dependent diabetes: uncontrolled  Has hyperglycemia last a1c in 9/201 was 10.8, will repeat a1c pending Consider lantus, ssi, linagliption, home meds metformin held  Peripheral neuropathy Continue home neurontin 1200 mg tid pcp to monitor renal function, renal dosing neurontin per pcp  HTN Continue home meds losartan, bp stable  CAD with h/o NSTEMI -currently no chest pain, tele has been unremarkable, will d/c tele -continue home meds statin , asa  Hyperthyroidism? On tapazole at home, tsh 5.5 Need to follow up with pcp to repeat tsh in 3-4 weeks, pcp to consider stop  tapazole   Body mass index is 28.7 kg/m.  Code Status: full  Family Communication: patient and daughter at bedside  Disposition Plan: home in am   Consultants:  none  Procedures:  none  Antibiotics:  none   Objective: BP (!) 147/88 (BP Location: Right Arm)   Pulse 80   Temp 97.8 F (36.6 C) (Oral)   Resp 18   Wt 75.8 kg   LMP  (LMP Unknown)   SpO2 99%   BMI 28.70 kg/m   Intake/Output Summary (Last 24 hours) at 11/27/2018 1331 Last data filed at 11/27/2018 1030 Gross per 24 hour  Intake 1863.19 ml  Output 1400 ml  Net 463.19 ml   Filed Weights   11/26/18 2214  Weight: 75.8 kg    Exam: Patient is examined daily including today on 11/27/2018, exams remain the same as of yesterday except that has changed    General:  NAD  Cardiovascular: RRR  Respiratory: CTABL  Abdomen: Soft/ND/NT, positive BS  Musculoskeletal: No Edema  Neuro: alert, oriented   Data Reviewed: Basic Metabolic Panel: Recent Labs  Lab 11/26/18 1504 11/27/18 0433  NA 133* 136  K 5.5* 4.1  CL 101 107  CO2 22 25  GLUCOSE 390* 396*  BUN 33* 23*  CREATININE 1.65* 1.17*  CALCIUM 9.4 8.9   Liver Function Tests: Recent Labs  Lab 11/27/18 0433  AST 21  ALT 24  ALKPHOS 130*  BILITOT 0.6  PROT 6.8  ALBUMIN 3.3*   No results for input(s): LIPASE, AMYLASE in the last 168 hours. No results for input(s): AMMONIA in the last 168 hours. CBC: Recent Labs  Lab  11/26/18 1504 11/27/18 0433  WBC 10.1 7.3  HGB 12.8 11.9*  HCT 38.8 36.5  MCV 82.4 81.8  PLT 339 286   Cardiac Enzymes:   No results for input(s): CKTOTAL, CKMB, CKMBINDEX, TROPONINI in the last 168 hours. BNP (last 3 results) No results for input(s): BNP in the last 8760 hours.  ProBNP (last 3 results) No results for input(s): PROBNP in the last 8760 hours.  CBG: Recent Labs  Lab 11/26/18 2227 11/27/18 0743 11/27/18 1131  GLUCAP 362* 294* 169*    Recent Results (from the past 240 hour(s))  Wet prep,  genital     Status: Abnormal   Collection Time: 11/26/18  9:42 PM  Result Value Ref Range Status   Yeast Wet Prep HPF POC NONE SEEN NONE SEEN Final   Trich, Wet Prep NONE SEEN NONE SEEN Final   Clue Cells Wet Prep HPF POC NONE SEEN NONE SEEN Final   WBC, Wet Prep HPF POC MANY (A) NONE SEEN Final   Sperm NONE SEEN  Final    Comment: Performed at [facility], [address], [place], [place] [address]     Studies: No results found.  Scheduled Meds: . aspirin  81 mg Oral Daily  . atorvastatin  20 mg Oral QHS  . clotrimazole  1 Applicatorful Vaginal QHS  . gabapentin  1,200 mg Oral TID  . heparin  5,000 Units Subcutaneous Q8H  . insulin aspart  0-5 Units Subcutaneous QHS  . insulin aspart  0-9 Units Subcutaneous TID WC  . insulin glargine  60 Units Subcutaneous QHS  . linagliptin  5 mg Oral Daily  . losartan  50 mg Oral Daily  . methimazole  5 mg Oral Daily  . polyethylene glycol  17 g Oral Daily  . senna-docusate  1 tablet Oral BID    Continuous Infusions: . sodium chloride       Time spent: I have personally reviewed and interpreted on  11/27/2018 daily labs, tele strips, imagings as discussed above under date review session and assessment and plans.  I reviewed all nursing notes, pharmacy notes, consultant notes,  vitals, pertinent old records  I have discussed plan of care as described above with RN , patient and family on 11/27/2018   Albertine Grates MD, PhD  [facility] Pager [phone]. If 7PM-7AM, please contact night-coverage at [email or web address], password [facility] 11/27/2018, 1:31 PM  LOS: 1 day

## 2018-11-28 DIAGNOSIS — R197 Diarrhea, unspecified: Secondary | ICD-10-CM

## 2018-11-28 DIAGNOSIS — E0590 Thyrotoxicosis, unspecified without thyrotoxic crisis or storm: Secondary | ICD-10-CM

## 2018-11-28 LAB — GASTROINTESTINAL PANEL BY PCR, STOOL (REPLACES STOOL CULTURE)
ADENOVIRUS F40/41: NOT DETECTED
Astrovirus: NOT DETECTED
CAMPYLOBACTER SPECIES: NOT DETECTED
Cryptosporidium: NOT DETECTED
Cyclospora cayetanensis: NOT DETECTED
ENTEROPATHOGENIC E COLI (EPEC): NOT DETECTED
ENTEROTOXIGENIC E COLI (ETEC): NOT DETECTED
Entamoeba histolytica: NOT DETECTED
Enteroaggregative E coli (EAEC): NOT DETECTED
Giardia lamblia: NOT DETECTED
Norovirus GI/GII: NOT DETECTED
PLESIMONAS SHIGELLOIDES: NOT DETECTED
Rotavirus A: NOT DETECTED
SAPOVIRUS (I, II, IV, AND V): NOT DETECTED
Salmonella species: NOT DETECTED
Shiga like toxin producing E coli (STEC): NOT DETECTED
Shigella/Enteroinvasive E coli (EIEC): NOT DETECTED
Vibrio cholerae: NOT DETECTED
Vibrio species: NOT DETECTED
Yersinia enterocolitica: NOT DETECTED

## 2018-11-28 LAB — CBC WITH DIFFERENTIAL/PLATELET
Abs Immature Granulocytes: 0.02 10*3/uL (ref 0.00–0.07)
BASOS ABS: 0.1 10*3/uL (ref 0.0–0.1)
Basophils Relative: 1 %
EOS ABS: 0.2 10*3/uL (ref 0.0–0.5)
Eosinophils Relative: 3 %
HCT: 34.1 % — ABNORMAL LOW (ref 36.0–46.0)
Hemoglobin: 11.3 g/dL — ABNORMAL LOW (ref 12.0–15.0)
Immature Granulocytes: 0 %
Lymphocytes Relative: 35 %
Lymphs Abs: 2.5 10*3/uL (ref 0.7–4.0)
MCH: 27.6 pg (ref 26.0–34.0)
MCHC: 33.1 g/dL (ref 30.0–36.0)
MCV: 83.4 fL (ref 80.0–100.0)
Monocytes Absolute: 0.6 10*3/uL (ref 0.1–1.0)
Monocytes Relative: 9 %
Neutro Abs: 3.9 10*3/uL (ref 1.7–7.7)
Neutrophils Relative %: 52 %
PLATELETS: 278 10*3/uL (ref 150–400)
RBC: 4.09 MIL/uL (ref 3.87–5.11)
RDW: 12.8 % (ref 11.5–15.5)
WBC: 7.3 10*3/uL (ref 4.0–10.5)
nRBC: 0 % (ref 0.0–0.2)

## 2018-11-28 LAB — COMPREHENSIVE METABOLIC PANEL
ALBUMIN: 3.1 g/dL — AB (ref 3.5–5.0)
ALT: 21 U/L (ref 0–44)
AST: 18 U/L (ref 15–41)
Alkaline Phosphatase: 111 U/L (ref 38–126)
Anion gap: 5 (ref 5–15)
BUN: 13 mg/dL (ref 6–20)
CO2: 24 mmol/L (ref 22–32)
Calcium: 8.6 mg/dL — ABNORMAL LOW (ref 8.9–10.3)
Chloride: 109 mmol/L (ref 98–111)
Creatinine, Ser: 0.88 mg/dL (ref 0.44–1.00)
GFR calc Af Amer: >60 mL/min (ref >60)
GFR calc non Af Amer: >60 mL/min (ref >60)
GLUCOSE: 182 mg/dL — AB (ref 70–99)
Potassium: 3.8 mmol/L (ref 3.5–5.1)
Sodium: 138 mmol/L (ref 135–145)
Total Bilirubin: 0.7 mg/dL (ref 0.3–1.2)
Total Protein: 6.3 g/dL — ABNORMAL LOW (ref 6.5–8.1)

## 2018-11-28 LAB — GLUCOSE, CAPILLARY
Glucose-Capillary: 209 mg/dL — ABNORMAL HIGH (ref 70–99)
Glucose-Capillary: 262 mg/dL — ABNORMAL HIGH (ref 70–99)

## 2018-11-28 LAB — HEMOGLOBIN A1C
Hgb A1c MFr Bld: 13.7 % — ABNORMAL HIGH (ref 4.8–5.6)
Mean Plasma Glucose: 346.49 mg/dL

## 2018-11-28 LAB — T4, FREE: FREE T4: 0.96 ng/dL (ref 0.82–1.77)

## 2018-11-28 LAB — MAGNESIUM: Magnesium: 2 mg/dL (ref 1.7–2.4)

## 2018-11-28 MED ORDER — CLOTRIMAZOLE 1 % VA CREA: 1.0000 | TOPICAL_CREAM | Freq: Every day | VAGINAL | 0 refills | Status: DC

## 2018-11-28 MED ORDER — POLYETHYLENE GLYCOL 3350 17 G PO PACK: 17.0000 g | PACK | Freq: Every day | ORAL | 0 refills | Status: DC

## 2018-11-28 MED ORDER — METHIMAZOLE 5 MG PO TABS: 5.0000 mg | ORAL_TABLET | ORAL | 3 refills | Status: DC

## 2018-11-28 MED ORDER — SENNOSIDES-DOCUSATE SODIUM 8.6-50 MG PO TABS: 1.0000 | ORAL_TABLET | Freq: Every day | ORAL | 0 refills | Status: DC

## 2018-11-28 MED ORDER — METHIMAZOLE 5 MG PO TABS: 5.0000 mg | ORAL_TABLET | Freq: Every day | ORAL | 3 refills | Status: DC

## 2018-11-28 MED FILL — methIMAzole 5 MG TABS: 5 | 30 days supply | Qty: 12 | Fill #0

## 2018-11-28 NOTE — Discharge Summary (Signed)
Discharge Summary  Rebecca [name] NPY:[id number] DOB: October 22, 1965  PCP: Loletta Specter, PA-C  Admit date: 11/26/2018 Discharge date: 11/28/2018  Time spent: , more than 50% time spent on coordination of care.  Recommendations for Outpatient Follow-up:  1. F/u with PCP within a week  for hospital discharge follow up, repeat cbc/bmp at follow up. pcp to repeat tsh in 3-4 weeks. pcp to monitor blood glucose control and a1c level.  Discharge Diagnoses:  Active Hospital Problems   Diagnosis Date Noted  . Infectious diarrhea 11/26/2018  . Insulin dependent diabetes mellitus (HCC)   . Orthostasis   . Vaginal discharge   . Hyperkalemia 11/26/2018  . AKI (acute kidney injury) (HCC) 03/04/2016  . Essential hypertension 05/14/2015  . CAD- with distal LAD disease, ? 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 05/14/2015  . DM (diabetes mellitus), type 2, uncontrolled (HCC)     Resolved Hospital Problems  No resolved problems to display.    Discharge Condition: stable  Diet recommendation: heart healthy/carb modified  Filed Weights   11/26/18 2214 11/27/18 2024  Weight: 75.8 kg 75.9 kg    History of present illness: (per admitting MD Dr Mikeal Hawthorne) PCP: Loletta Specter, PA-C   Outpatient Specialists: None  Patient coming from: Home via urgent care  Chief Complaint: Vaginal discharge, persistent diarrhea and dizziness  HPI: Rebecca [name] is a 53 y.o. female Spanish-speaking only with medical history significant of diabetes, hypertension, pyelonephritis, hyperlipidemia, recent treatment with antibiotics since 10 February, who went to the urgent care center with complaint of vaginal discharge, dizziness and weakness.  She has had mild nausea with no vomiting but persistent diarrhea for the last 3 days.  Patient has been on antibiotics for close to 2 weeks.  She denied any fever or chills.  Patient was seen in the ER and evaluated.  She appears to have bacterial vaginosis with evidence of  dehydration, acute kidney injury and hyperkalemia.  Patient suspected to have had prerenal azotemia from dehydration.  She has had fatigue and headaches.  Patient suspected to have infectious diarrhea possibly C. difficile colitis.  She is being admitted for medical management..  ED Course: Temperature 97.9 blood pressure 179/81 with pulse 86 respiratory rate of 21 oxygen sat 98% room air.  White count 10.1 and hemoglobin 12.8 platelets 339.  Sodium is 133 potassium 5.5 chloride 101 CO2 22 BUN 33 creatinine 1.65 calcium 9.4 glucose 390.  Pelvic exam done showed no yeast only WBCs on wet prep no clue cells and no trichomonas.  Patient received IV fluid resuscitation and is being admitted for treatment.  Hospital Course:  Principal Problem:   Infectious diarrhea Active Problems:   DM (diabetes mellitus), type 2, uncontrolled (HCC)   Essential hypertension   CAD- with distal LAD disease, ? 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   AKI (acute kidney injury) (HCC)   Hyperkalemia   Insulin dependent diabetes mellitus (HCC)   Orthostasis   Vaginal discharge   Overflow Diarrhea in the setting of constipation -due to recent abx use, initially concerns for c diff, however she does not appear septic, no leukocytosis, no fever,   -she reports chronic constipation, started to have intermittent loose stool last two weeks -she currently does not have ab pain, stool is formed -she is started on stool softener    Vaginal discharge: Wet prep negative for yeast, trich or clues cells, does has many wbc Topical clotrimazole daily for 7 days  Dizziness: likely from orthostasis from dehydration Dizziness resolved after hydration  AKI on CKDII: -  ua no infection, aki likely due to dehydration -bun/cr on presentation ws 33/1.65, she received hydration, bun/cr at discharge normalized at 13/0.88   Insulin dependent diabetes: uncontrolled with hyperglycemia last a1c in 9/201 was 10.8,  repeat a1c 13.7 Continue lantus,  linagliption,  home meds metformin held in the hospital, resumed at discharge  Peripheral neuropathy Continue home neurontin 1200 mg tid pcp to monitor renal function, renal dosing neurontin per pcp  HTN Continue home meds losartan, bp stable  CAD with h/o NSTEMI -currently no chest pain, tele has been unremarkable, will d/c tele -continue home meds statin , asa  H/o Hyperthyroidism On tapazole 5mg  daily at home, tsh 5.5 Reduced tapazole from daily to qMWF Need to follow up with pcp to repeat tsh in 3-4 weeks, pcp to consider stop tapazole   Body mass index is 28.7 kg/m.  Code Status: full  Family Communication: patient and daughter at bedside on 3/8  Disposition Plan: home    Consultants:  none  Procedures:  none  Antibiotics:  none   Discharge Exam: BP (!) 144/85 (BP Location: Right Arm)   Pulse 85   Temp 97.9 F (36.6 C) (Oral)   Resp 18   Wt 75.9 kg   LMP  (LMP Unknown)   SpO2 99%   BMI 28.72 kg/m   General: NAD Cardiovascular: RRR Respiratory: CTABL Ab: nontender, soft, + bs  Discharge Instructions You were cared for by a hospitalist during your hospital stay. If you have any questions about your discharge medications or the care you received while you were in the hospital after you are discharged, you can call the unit and asked to speak with the hospitalist on call if the hospitalist that took care of you is not available. Once you are discharged, your primary care physician will handle any further medical issues. Please note that NO REFILLS for any discharge medications will be authorized once you are discharged, as it is imperative that you return to your primary care physician (or establish a relationship with a primary care physician if you do not have one) for your aftercare needs so that they can reassess your need for medications and monitor your lab values.  Discharge Instructions    Diet - low sodium heart healthy   Complete  by:  As directed    Carb modified diet   Increase activity slowly   Complete by:  As directed      Allergies as of 11/28/2018   No Known Allergies     Medication List    STOP taking these medications   cephALEXin 500 MG capsule Commonly known as:  KEFLEX   naproxen 500 MG tablet Commonly known as:  Naprosyn   ondansetron 4 MG disintegrating tablet Commonly known as:  Zofran ODT     TAKE these medications   aspirin 81 MG EC tablet Take 1 tablet (81 mg total) by mouth daily.   atorvastatin 20 MG tablet Commonly known as:  LIPITOR Take 1 tablet (20 mg total) by mouth at bedtime.   clotrimazole 1 % vaginal cream Commonly known as:  GYNE-LOTRIMIN Place 1 Applicatorful vaginally at bedtime.   gabapentin 600 MG tablet Commonly known as:  NEURONTIN Take 2 tablets (1,200 mg total) by mouth 3 (three) times daily.   Insulin Glargine 100 UNIT/ML Solostar Pen Commonly known as:  Lantus SoloStar Inject 60 Units into the skin at bedtime. 60 units at bedtime. What changed:  additional instructions   losartan 50 MG tablet Commonly known  as:  COZAAR Take 1 tablet (50 mg total) by mouth daily.   metFORMIN 500 MG 24 hr tablet Commonly known as:  Glucophage XR Take 2 tablets (1,000 mg total) by mouth daily with breakfast.   methimazole 5 MG tablet Commonly known as:  TAPAZOLE Take 1 tablet (5 mg total) by mouth every Monday, Wednesday, and Friday. What changed:  when to take this   nitroGLYCERIN 0.4 MG SL tablet Commonly known as:  Nitrostat Place 1 tablet (0.4 mg total) under the tongue every 5 (five) minutes as needed for chest pain.   Pen Needles 31G X 8 MM Misc Inject 1 each into the skin at bedtime.   polyethylene glycol packet Commonly known as:  MIRALAX / GLYCOLAX Take 17 g by mouth daily.   senna-docusate 8.6-50 MG tablet Commonly known as:  Senokot-S Take 1 tablet by mouth at bedtime.   sitaGLIPtin 100 MG tablet Commonly known as:  Januvia Take 1 tablet  (100 mg total) by mouth daily.      No Known Allergies Follow-up Information    Loletta Specter, PA-C Follow up in 1 week(s).   Specialty:  Physician Assistant Why:  hospital discharge follow up, repeat cbc/bmp at follow up, pcp to repeat thyroid function in 3-4 weeks. pcp to continue monitor blood glucose management, a1c level. Contact information: [address] [place] [address] [phone]            The results of significant diagnostics from this hospitalization (including imaging, microbiology, ancillary and laboratory) are listed below for reference.    Significant Diagnostic Studies: No results found.  Microbiology: Recent Results (from the past 240 hour(s))  Wet prep, genital     Status: Abnormal   Collection Time: 11/26/18  9:42 PM  Result Value Ref Range Status   Yeast Wet Prep HPF POC NONE SEEN NONE SEEN Final   Trich, Wet Prep NONE SEEN NONE SEEN Final   Clue Cells Wet Prep HPF POC NONE SEEN NONE SEEN Final   WBC, Wet Prep HPF POC MANY (A) NONE SEEN Final   Sperm NONE SEEN  Final    Comment: Performed at [facility], [address], [place], [place] [address]     Labs: Basic Metabolic Panel: Recent Labs  Lab 11/26/18 1504 11/27/18 0433 11/28/18 0605  NA 133* 136 138  K 5.5* 4.1 3.8  CL 101 107 109  CO2 GLUCOSE 390* 396* 182*  BUN 33* 23* 13  CREATININE 1.65* 1.17* 0.88  CALCIUM 9.4 8.9 8.6*  MG  --   --  2.0   Liver Function Tests: Recent Labs  Lab 11/27/18 0433 11/28/18 0605  AST 21 18  ALT 24 21  ALKPHOS 130* 111  BILITOT 0.6 0.7  PROT 6.8 6.3*  ALBUMIN 3.3* 3.1*   No results for input(s): LIPASE, AMYLASE in the last 168 hours. No results for input(s): AMMONIA in the last 168 hours. CBC: Recent Labs  Lab 11/26/18 1504 11/27/18 0433 11/28/18 0605  WBC 10.1 7.3 7.3  NEUTROABS  --   --  3.9  HGB 12.8 11.9* 11.3*  HCT 38.8 36.5 34.1*  MCV 82.4 81.8 83.4  PLT 339 286 278   Cardiac Enzymes: No  results for input(s): CKTOTAL, CKMB, CKMBINDEX, TROPONINI in the last 168 hours. BNP: BNP (last 3 results) No results for input(s): BNP in the last 8760 hours.  ProBNP (last 3 results) No results for input(s): PROBNP in the last 8760  hours.  CBG: Recent Labs  Lab 11/27/18 0743 11/27/18 1131 11/27/18 1652 11/27/18 2025 11/28/18 0712  GLUCAP 294* 169* 185* 305* 209*       Signed:  Albertine Grates MD, PhD  [facility] 11/28/2018, 9:02 AM

## 2018-11-28 NOTE — Progress Notes (Signed)
Inpatient Diabetes Program Recommendations  AACE/ADA: New Consensus Statement on Inpatient Glycemic Control (2015)  Target Ranges:  Prepandial:   less than 140 mg/dL      Peak postprandial:   less than 180 mg/dL (1-2 hours)      Critically ill patients:  140 - 180 mg/dL   Lab Results  Component Value Date   GLUCAP 262 (H) 11/28/2018   HGBA1C 13.7 (H) 11/28/2018    Review of Glycemic Control Results for Rebecca [name], Rebecca [name] (MRN [id number]) as of 11/28/2018 12:08  Ref. Range 11/27/2018 20:25 11/28/2018 07:12 11/28/2018 11:04  Glucose-Capillary Latest Ref Range: 70 - 99 mg/dL 341 (H) 962 (H) 229 (H)   Diabetes history: Type 2 DM Outpatient Diabetes medications: Januvia 100 mg QD, Metformin 1000 mg QAM, Lantus 60 units QHS Current orders for Inpatient glycemic control: Lantus 60 units QHS, Novolog 0-9 units TID, Novolog 0-5 units QHS  Inpatient Diabetes Program Recommendations:    Discussed with patient diabetes management using an interpreter. Verified that patient had been taking Lantus at home and reports not missing doses.   Reviewed patient's current A1c of 13.7%. Explained what a A1c is and what it measures. Also reviewed goal A1c with patient, importance of good glucose control @ home, and blood sugar goals. Reviewed patho of DM, need for insulin, current inpatient trends despite current Lantus dose, comorbidites and vascular changes that occur form poor glycemic control.   Stressed the importance of scheduling a follow up with the [facility]. Patient agrees to make an appointment. Has a meter and testing supplies, however, has not used recently. Discussed the importance of checking 3 times per day in order to make insulin adjustments at next appointment, as patient may need additional insulin adjustments. Patient to make appointments and agrees. No further questions at this time.   Thanks, Lujean Rave, MSN, RNC-OB Diabetes Coordinator [phone]  (8a-5p)

## 2018-11-29 LAB — GC/CHLAMYDIA PROBE AMP (~~LOC~~) NOT AT ARMC
Chlamydia: NEGATIVE
Neisseria Gonorrhea: NEGATIVE

## 2018-11-29 LAB — T3, FREE: T3 FREE: 3 pg/mL (ref 2.0–4.4)

## 2018-12-05 MED FILL — !JANUVIA 100MG TABLET: 100 | 30 days supply | Qty: 30 | Fill #4

## 2018-12-05 MED FILL — !LANTUS SOLOSTAR 100UNITS/M: 100 | 30 days supply | Qty: 18 | Fill #8

## 2018-12-13 ENCOUNTER — Ambulatory Visit (INDEPENDENT_AMBULATORY_CARE_PROVIDER_SITE_OTHER): Payer: Self-pay | Admitting: Primary Care

## 2018-12-13 ENCOUNTER — Other Ambulatory Visit: Payer: Self-pay

## 2018-12-13 ENCOUNTER — Encounter (INDEPENDENT_AMBULATORY_CARE_PROVIDER_SITE_OTHER): Payer: Self-pay | Admitting: Primary Care

## 2018-12-13 VITALS — BP 156/90 | HR 87 | Temp 97.7°F | Ht 64.0 in | Wt 178.2 lb

## 2018-12-13 DIAGNOSIS — E1165 Type 2 diabetes mellitus with hyperglycemia: Secondary | ICD-10-CM

## 2018-12-13 DIAGNOSIS — I10 Essential (primary) hypertension: Secondary | ICD-10-CM

## 2018-12-13 DIAGNOSIS — Z760 Encounter for issue of repeat prescription: Secondary | ICD-10-CM

## 2018-12-13 DIAGNOSIS — Z794 Long term (current) use of insulin: Secondary | ICD-10-CM

## 2018-12-13 DIAGNOSIS — N898 Other specified noninflammatory disorders of vagina: Secondary | ICD-10-CM

## 2018-12-13 DIAGNOSIS — Z1211 Encounter for screening for malignant neoplasm of colon: Secondary | ICD-10-CM

## 2018-12-13 DIAGNOSIS — E1169 Type 2 diabetes mellitus with other specified complication: Secondary | ICD-10-CM

## 2018-12-13 DIAGNOSIS — E119 Type 2 diabetes mellitus without complications: Secondary | ICD-10-CM

## 2018-12-13 DIAGNOSIS — E785 Hyperlipidemia, unspecified: Secondary | ICD-10-CM

## 2018-12-13 DIAGNOSIS — Z09 Encounter for follow-up examination after completed treatment for conditions other than malignant neoplasm: Secondary | ICD-10-CM

## 2018-12-13 DIAGNOSIS — E7841 Elevated Lipoprotein(a): Secondary | ICD-10-CM

## 2018-12-13 LAB — POCT CBG (FASTING - GLUCOSE)-MANUAL ENTRY: Glucose Fasting, POC: 333 mg/dL — AB (ref 70–99)

## 2018-12-13 LAB — GLUCOSE, POCT (MANUAL RESULT ENTRY): POC Glucose: 307 mg/dl — AB (ref 70–99)

## 2018-12-13 MED ORDER — PEN NEEDLES 31G X 8 MM MISC: 1.0000 | Freq: Every day | 5 refills | Status: DC

## 2018-12-13 MED ORDER — LOSARTAN POTASSIUM 50 MG PO TABS: 50.0000 mg | ORAL_TABLET | Freq: Every day | ORAL | 5 refills | Status: DC

## 2018-12-13 MED ORDER — ATORVASTATIN CALCIUM 20 MG PO TABS: 20.0000 mg | ORAL_TABLET | Freq: Every day | ORAL | 5 refills | Status: DC

## 2018-12-13 MED ORDER — HYDROCHLOROTHIAZIDE 12.5 MG PO TABS: 12.5000 mg | ORAL_TABLET | Freq: Every day | ORAL | 3 refills | Status: DC

## 2018-12-13 MED ORDER — METFORMIN HCL ER 500 MG PO TB24: 1000.0000 mg | ORAL_TABLET | Freq: Every day | ORAL | 5 refills | Status: DC

## 2018-12-13 MED ORDER — INSULIN GLARGINE 100 UNIT/ML SOLOSTAR PEN: 30.0000 [IU] | PEN_INJECTOR | Freq: Two times a day (BID) | SUBCUTANEOUS | 5 refills | Status: DC

## 2018-12-13 MED ORDER — HYDROCHLOROTHIAZIDE 12.5 MG PO TABS: 25.0000 mg | ORAL_TABLET | Freq: Every day | ORAL | 3 refills | Status: DC

## 2018-12-13 MED ORDER — INSULIN ASPART 100 UNIT/ML ~~LOC~~ SOLN
10.0000 [IU] | Freq: Once | SUBCUTANEOUS | Status: AC
  Administered 2018-12-13: 10 [IU] via SUBCUTANEOUS

## 2018-12-13 MED ORDER — GABAPENTIN 600 MG PO TABS: 1200.0000 mg | ORAL_TABLET | Freq: Three times a day (TID) | ORAL | 5 refills | Status: DC

## 2018-12-13 MED ORDER — SITAGLIPTIN PHOSPHATE 100 MG PO TABS: 100.0000 mg | ORAL_TABLET | Freq: Every day | ORAL | 5 refills | Status: DC

## 2018-12-13 NOTE — Progress Notes (Signed)
Established Patient Office Visit  Subjective:  Patient ID: Rebecca [name], female    DOB: 03-04-1966  Age: 53 y.o. MRN: [id number]  CC:  Chief Complaint  Patient presents with  . Hospitalization Follow-up    hyperkalemia/diarrhea     HPI Rebecca [name] presents for a hospital follow up. Past medical history significant of diabetes, hypertension, pyelonephritis, and hyperlipidemia.  Patient suspected to have infectious diarrhea possibly C. difficile colitis. No confirmation found.  Past Medical History:  Diagnosis Date  . Anemia   . Diabetes mellitus   . Dyslipidemia   . Hypertension   . Non-STEMI (non-ST elevated myocardial infarction) [facility])    Medical therapy for distal LAD dissection  . Thyroid disease     Past Surgical History:  Procedure Laterality Date  . AMPUTATION Right 07/21/2017   Procedure: RIGHT GREAT TOE AMPUTATION;  Surgeon: Nadara Mustard, MD;  Location: [facility] OR;  Service: Orthopedics;  Laterality: Right;  . CARDIAC CATHETERIZATION N/A 05/14/2015   Procedure: Left Heart Cath and Coronary Angiography;  Surgeon: Marykay Lex, MD; dLAD 60%>>95% (small vessel, ?dissection), CFX & RCA systems no sig dz, EF & LVEDP nl       Family History  Problem Relation Age of Onset  . Hypertension Father   . Hyperlipidemia Father   . Cancer Mother   . Diabetes Sister   . Diabetes Brother   . Diabetes Sister   . Diabetes Sister   . Diabetes Sister   . Diabetes Brother   . Diabetes Brother   . Diabetes Brother   . Diabetes Brother     Social History   Socioeconomic History  . Marital status: Married    Spouse name: Not on file  . Number of children: Not on file  . Years of education: Not on file  . Highest education level: Not on file  Occupational History  . Occupation: unemployed  Social Needs  . Financial resource strain: Not on file  . Food insecurity:    Worry: Not on file    Inability: Not on file  . Transportation needs:    Medical: Not on file     Non-medical: Not on file  Tobacco Use  . Smoking status: Never Smoker  . Smokeless tobacco: Never Used  Substance and Sexual Activity  . Alcohol use: No  . Drug use: No  . Sexual activity: Yes  Lifestyle  . Physical activity:    Days per week: Not on file    Minutes per session: Not on file  . Stress: Not on file  Relationships  . Social connections:    Talks on phone: Not on file    Gets together: Not on file    Attends religious service: Not on file    Active member of club or organization: Not on file    Attends meetings of clubs or organizations: Not on file    Relationship status: Not on file  . Intimate partner violence:    Fear of current or ex partner: Not on file    Emotionally abused: Not on file    Physically abused: Not on file    Forced sexual activity: Not on file  Other Topics Concern  . Not on file  Social History Narrative  . Not on file    Outpatient Medications Prior to Visit  Medication Sig Dispense Refill  . aspirin 81 MG EC tablet Take 1 tablet (81 mg total) by mouth daily. 90 tablet 1  . clotrimazole (  GYNE-LOTRIMIN) 1 % vaginal cream Place 1 Applicatorful vaginally at bedtime. 45 g 0  . methimazole (TAPAZOLE) 5 MG tablet Take 1 tablet (5 mg total) by mouth every Monday, Wednesday, and Friday. 90 tablet 3  . nitroGLYCERIN (NITROSTAT) 0.4 MG SL tablet Place 1 tablet (0.4 mg total) under the tongue every 5 (five) minutes as needed for chest pain. 25 tablet 3  . polyethylene glycol (MIRALAX / GLYCOLAX) packet Take 17 g by mouth daily. 14 each 0  . senna-docusate (SENOKOT-S) 8.6-50 MG tablet Take 1 tablet by mouth at bedtime. 30 tablet 0  . atorvastatin (LIPITOR) 20 MG tablet Take 1 tablet (20 mg total) by mouth at bedtime. 30 tablet 5  . gabapentin (NEURONTIN) 600 MG tablet Take 2 tablets (1,200 mg total) by mouth 3 (three) times daily. 180 tablet 5  . Insulin Glargine (LANTUS SOLOSTAR) 100 UNIT/ML Solostar Pen Inject 60 Units into the skin at bedtime.  60 units at bedtime. (Patient taking differently: Inject 60 Units into the skin at bedtime. ) 18 mL 5  . Insulin Pen Needle (PEN NEEDLES) 31G X 8 MM MISC Inject 1 each into the skin at bedtime. 30 each 5  . losartan (COZAAR) 50 MG tablet Take 1 tablet (50 mg total) by mouth daily. 30 tablet 5  . sitaGLIPtin (JANUVIA) 100 MG tablet Take 1 tablet (100 mg total) by mouth daily. 30 tablet 5  . metFORMIN (GLUCOPHAGE XR) 500 MG 24 hr tablet Take 2 tablets (1,000 mg total) by mouth daily with breakfast. (Patient not taking: Reported on 11/27/2018) 60 tablet 5   No facility-administered medications prior to visit.     No Known Allergies  ROS Review of Systems  Constitutional: Negative.   HENT: Negative.   Eyes: Negative.   Respiratory: Negative.   Cardiovascular: Negative.   Gastrointestinal: Negative.   Endocrine: Negative.   Genitourinary: Negative.   Musculoskeletal: Negative.   Skin: Negative.   Allergic/Immunologic: Negative.   Neurological: Negative.   Hematological: Negative.   Psychiatric/Behavioral: Negative.       Objective:    Physical Exam  Constitutional: She is oriented to person, place, and time. She appears well-developed and well-nourished.  HENT:  Head: Normocephalic and atraumatic.  Eyes: Pupils are equal, round, and reactive to light. EOM are normal.  Neck: Normal range of motion.  Cardiovascular: Normal rate and regular rhythm.  Pulmonary/Chest: Effort normal and breath sounds normal.  Abdominal: Soft. Bowel sounds are normal.  Musculoskeletal: Normal range of motion.  Neurological: She is alert and oriented to person, place, and time.  Skin: Skin is warm and dry.  Psychiatric: She has a normal mood and affect.    BP (!) 156/90 (BP Location: Right Arm, Patient Position: Sitting, Cuff Size: Normal) Comment: without medications  Pulse 87   Temp 97.7 F (36.5 C) (Oral)   Ht  (1.626 m)   Wt 178 lb 3.2 oz (80.8 kg)   LMP  (LMP Unknown)   SpO2 99%    BMI 30.59 kg/m  Wt Readings from Last 3 Encounters:  12/13/18 178 lb 3.2 oz (80.8 kg)  11/27/18 167 lb 5.3 oz (75.9 kg)  10/31/18 185 lb (83.9 kg)     Health Maintenance Due  Topic Date Due  . OPHTHALMOLOGY EXAM  12/24/1975  . Fecal DNA (Cologuard)  12/24/2015  . PAP SMEAR-Modifier  08/06/2018    There are no preventive care reminders to display for this patient.  Lab Results  Component Value Date   TSH  5.512 (H) 11/27/2018   Lab Results  Component Value Date   WBC 7.3 11/28/2018   HGB 11.3 (L) 11/28/2018   HCT 34.1 (L) 11/28/2018   MCV 83.4 11/28/2018   PLT 278 11/28/2018   Lab Results  Component Value Date   NA 138 11/28/2018   K 3.8 11/28/2018   CO2 24 11/28/2018   GLUCOSE 182 (H) 11/28/2018   BUN 13 11/28/2018   CREATININE 0.88 11/28/2018   BILITOT 0.7 11/28/2018   ALKPHOS 111 11/28/2018   AST 18 11/28/2018   ALT 21 11/28/2018   PROT 6.3 (L) 11/28/2018   ALBUMIN 3.1 (L) 11/28/2018   CALCIUM 8.6 (L) 11/28/2018   ANIONGAP 5 11/28/2018   Lab Results  Component Value Date   CHOL 190 07/30/2015   Lab Results  Component Value Date   HDL 42 (L) 07/30/2015   Lab Results  Component Value Date   LDLCALC 122 07/30/2015   Lab Results  Component Value Date   TRIG 130 07/30/2015   Lab Results  Component Value Date   CHOLHDL 4.5 07/30/2015   Lab Results  Component Value Date   HGBA1C 13.7 (H) 11/28/2018      Assessment & Plan:   Problem List Items Addressed This Visit    DM (diabetes mellitus), type 2, uncontrolled (HCC) - Primary (Chronic)   Relevant Medications   atorvastatin (LIPITOR) 20 MG tablet   gabapentin (NEURONTIN) 600 MG tablet   Insulin Glargine (LANTUS SOLOSTAR) 100 UNIT/ML Solostar Pen   Insulin Pen Needle (PEN NEEDLES) 31G X 8 MM MISC   losartan (COZAAR) 50 MG tablet   metFORMIN (GLUCOPHAGE XR) 500 MG 24 hr tablet   sitaGLIPtin (JANUVIA) 100 MG tablet   insulin aspart (novoLOG) injection 10 Units (Completed)   Other Relevant  Orders   Glucose (CBG), Fasting (Completed)   Comprehensive metabolic panel   Ambulatory referral to Ophthalmology   Glucose (CBG) (Completed)   Vaginal discharge    Other Visit Diagnoses    Elevated lipoprotein(a)       Relevant Medications   atorvastatin (LIPITOR) 20 MG tablet   Other Relevant Orders   Lipid Panel   Hypertension, unspecified type       Relevant Medications   atorvastatin (LIPITOR) 20 MG tablet   losartan (COZAAR) 50 MG tablet   hydrochlorothiazide (HYDRODIURIL) 12.5 MG tablet   Type 2 diabetes mellitus without complication, with long-term current use of insulin (HCC)       Relevant Medications   atorvastatin (LIPITOR) 20 MG tablet   Insulin Glargine (LANTUS SOLOSTAR) 100 UNIT/ML Solostar Pen   losartan (COZAAR) 50 MG tablet   metFORMIN (GLUCOPHAGE XR) 500 MG 24 hr tablet   sitaGLIPtin (JANUVIA) 100 MG tablet   insulin aspart (novoLOG) injection 10 Units (Completed)   Hospital discharge follow-up       Medication refill       Relevant Medications   atorvastatin (LIPITOR) 20 MG tablet   gabapentin (NEURONTIN) 600 MG tablet   Insulin Glargine (LANTUS SOLOSTAR) 100 UNIT/ML Solostar Pen   Insulin Pen Needle (PEN NEEDLES) 31G X 8 MM MISC   losartan (COZAAR) 50 MG tablet   metFORMIN (GLUCOPHAGE XR) 500 MG 24 hr tablet   sitaGLIPtin (JANUVIA) 100 MG tablet   insulin aspart (novoLOG) injection 10 Units (Completed)   Colon cancer screening       Relevant Orders   Fecal occult blood, imunochemical   Hyperlipidemia associated with type 2 diabetes mellitus (HCC)  Relevant Medications   atorvastatin (LIPITOR) 20 MG tablet   Insulin Glargine (LANTUS SOLOSTAR) 100 UNIT/ML Solostar Pen   losartan (COZAAR) 50 MG tablet   metFORMIN (GLUCOPHAGE XR) 500 MG 24 hr tablet   sitaGLIPtin (JANUVIA) 100 MG tablet   insulin aspart (novoLOG) injection 10 Units (Completed)    Rebecca [name] was seen today for hospitalization follow-up.  Diagnoses and all orders for this  visit:  Uncontrolled type 2 diabetes mellitus with hyperglycemia (HCC) Medication adjusted morning BS are 60 to 80 than afternoon and evening ranges from 200-300. Lantus divided to 30 units am after BKF and 30 units after supper. SSI TID amount and numbers given to her in spanish. Cont taking her oral agents as prescribed . A1C 13.7 noncompliant discussed with interpretor diet modification. She has previously lost a great right toe from non healing ulcer from uncontrol DM. Microfilament normal on left foot and no feeling on right foot upon exam  -     Glucose (CBG), Fasting -     gabapentin (NEURONTIN) 600 MG tablet; Take 2 tablets (1,200 mg total) by mouth 3 (three) times daily. -     Insulin Glargine (LANTUS SOLOSTAR) 100 UNIT/ML Solostar Pen; Inject 30 Units into the skin 2 (two) times daily for 30 days. 60 units at bedtime. -     Insulin Pen Needle (PEN NEEDLES) 31G X 8 MM MISC; Inject 1 each into the skin at bedtime. -     insulin aspart (novoLOG) injection 10 Units -     Comprehensive metabolic panel -     Ambulatory referral to Ophthalmology  Elevated lipoprotein(a) -     atorvastatin (LIPITOR) 20 MG tablet; Take 1 tablet (20 mg total) by mouth at bedtime. Elevated at this visit added HCTZ 12.5  Hypertension, unspecified type -     losartan (COZAAR) 50 MG tablet; Take 1 tablet (50 mg total) by mouth daily.  Type 2 diabetes mellitus without complication, with long-term current use of insulin (HCC) -     metFORMIN (GLUCOPHAGE XR) 500 MG 24 hr tablet; Take 2 tablets (1,000 mg total) by mouth daily with breakfast. -     sitaGLIPtin (JANUVIA) 100 MG tablet; Take 1 tablet (100 mg total) by mouth daily.  Hospital discharge corse  female Spanish-speaking only with medical history significant of diabetes, hypertension, pyelonephritis, hyperlipidemia, recent treatment with antibiotics since 10 February, who went to the urgent care center with complaint of vaginal discharge, dizziness and  weakness.  She has had mild nausea with no vomiting but persistent diarrhea for the last 3 days.  Patient has been on antibiotics for close to 2 weeks.  She denied any fever or chills.  Patient was seen in the ER and evaluated.  She appears to have bacterial vaginosis with evidence of dehydration, acute kidney injury and hyperkalemia.  Patient suspected to have had prerenal azotemia from dehydration.  She has had fatigue and headaches.  Patient suspected to have infectious diarrhea possibly C. difficile colitis.  She is being admitted for medical management..  Vaginal discharge Scant amount denies irritate BV completed medication   Medication refill -     atorvastatin (LIPITOR) 20 MG tablet; Take 1 tablet (20 mg total) by mouth at bedtime. -     gabapentin (NEURONTIN) 600 MG tablet; Take 2 tablets (1,200 mg total) by mouth 3 (three) times daily. -     Insulin Glargine (LANTUS SOLOSTAR) 100 UNIT/ML Solostar Pen; Inject 30 Units into the skin 2 (two) times daily for  30 days. 60 units at bedtime. -     Insulin Pen Needle (PEN NEEDLES) 31G X 8 MM MISC; Inject 1 each into the skin at bedtime. -     losartan (COZAAR) 50 MG tablet; Take 1 tablet (50 mg total) by mouth daily. -     metFORMIN (GLUCOPHAGE XR) 500 MG 24 hr tablet; Take 2 tablets (1,000 mg total) by mouth daily with breakfast. -     sitaGLIPtin (JANUVIA) 100 MG tablet; Take 1 tablet (100 mg total) by mouth daily. -     insulin aspart (novoLOG) injection 10 Units  Colon cancer screening -     Fecal occult blood, imunochemical; Future   No orders of the defined types were placed in this encounter.   Follow-up: Return in about 3 months (around 03/15/2019) for HTN, DM,.    Grayce Sessions, NP

## 2018-12-13 NOTE — Progress Notes (Signed)
Pt complains that every 2 days she still has diarrhea

## 2018-12-14 LAB — COMPREHENSIVE METABOLIC PANEL
ALT: 31 IU/L (ref 0–32)
AST: 24 IU/L (ref 0–40)
Albumin/Globulin Ratio: 1.5 (ref 1.2–2.2)
Albumin: 4.5 g/dL (ref 3.8–4.9)
Alkaline Phosphatase: 167 IU/L — ABNORMAL HIGH (ref 39–117)
BILIRUBIN TOTAL: 0.4 mg/dL (ref 0.0–1.2)
BUN/Creatinine Ratio: 20 (ref 9–23)
BUN: 27 mg/dL — ABNORMAL HIGH (ref 6–24)
CO2: 24 mmol/L (ref 20–29)
Calcium: 9.9 mg/dL (ref 8.7–10.2)
Chloride: 97 mmol/L (ref 96–106)
Creatinine, Ser: 1.34 mg/dL — ABNORMAL HIGH (ref 0.57–1.00)
GFR calc Af Amer: 53 mL/min/{1.73_m2} — ABNORMAL LOW (ref >59)
GFR calc non Af Amer: 46 mL/min/{1.73_m2} — ABNORMAL LOW (ref >59)
Globulin, Total: 3 g/dL (ref 1.5–4.5)
Glucose: 340 mg/dL — ABNORMAL HIGH (ref 65–99)
Potassium: 4.9 mmol/L (ref 3.5–5.2)
Sodium: 134 mmol/L (ref 134–144)
Total Protein: 7.5 g/dL (ref 6.0–8.5)

## 2018-12-14 LAB — LIPID PANEL
Chol/HDL Ratio: 4.4 ratio (ref 0.0–4.4)
Cholesterol, Total: 173 mg/dL (ref 100–199)
HDL: 39 mg/dL — AB (ref >39)
LDL Calculated: 99 mg/dL (ref 0–99)
Triglycerides: 173 mg/dL — ABNORMAL HIGH (ref 0–149)
VLDL CHOLESTEROL CAL: 35 mg/dL (ref 5–40)

## 2018-12-14 MED FILL — LOSARTAN POTASSIUM 50 MG TA: 50 | 30 days supply | Qty: 30 | Fill #4

## 2018-12-17 MED FILL — HYDROCHLOROTHIAZIDE 12.5 MG: 12.5 | 30 days supply | Qty: 30 | Fill #0

## 2018-12-19 MED FILL — ATORVASTATIN 20 MG TABLET: 20 | 30 days supply | Qty: 30 | Fill #3

## 2018-12-19 MED FILL — methIMAzole 5 MG TABS: 5 | 30 days supply | Qty: 30 | Fill #4

## 2018-12-24 MED FILL — GABAPENTIN 600 MG TABLET: 600 | 30 days supply | Qty: 180 | Fill #5

## 2019-01-01 IMAGING — DX DG TOE GREAT 2+V*R*
3 series · 3 of 3 positions shown · non-contrast
Comparison: 06/15/2017

CLINICAL DATA: Diabetic ulcer

EXAM:
RIGHT GREAT TOE

[x toes ap right]
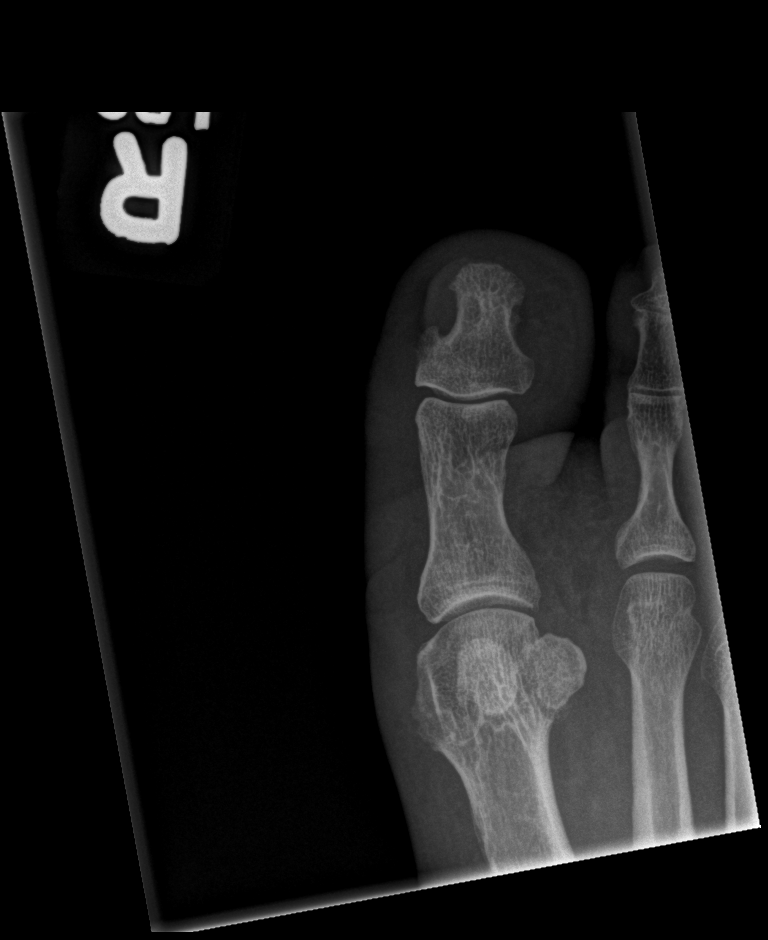

[x toes obl right]
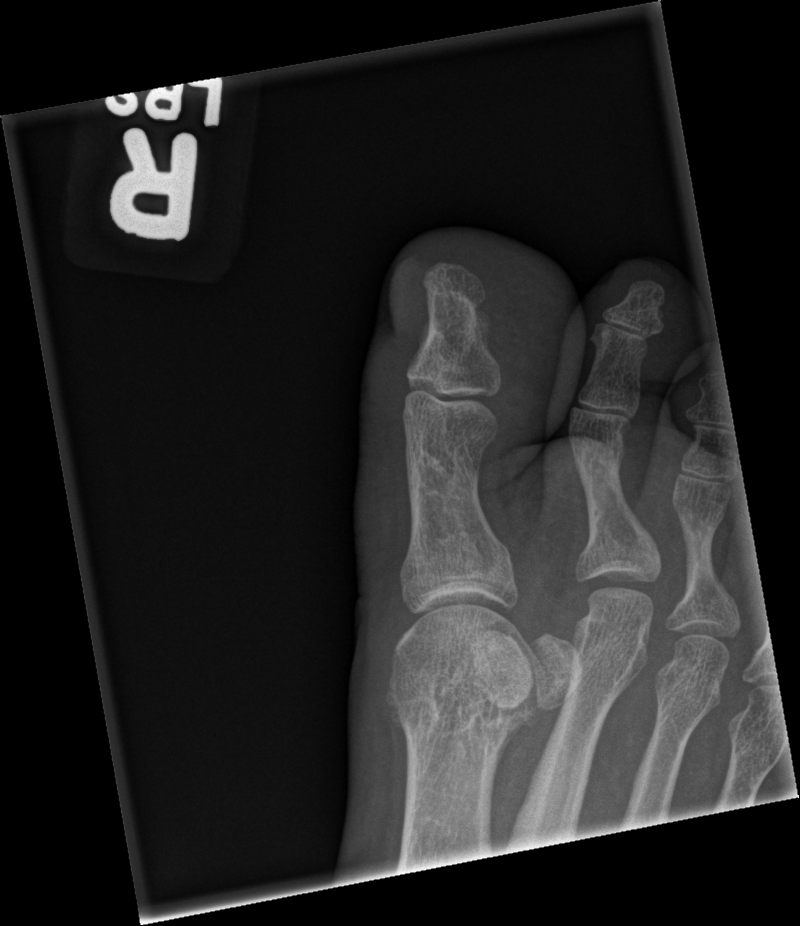

[x toes lat right]
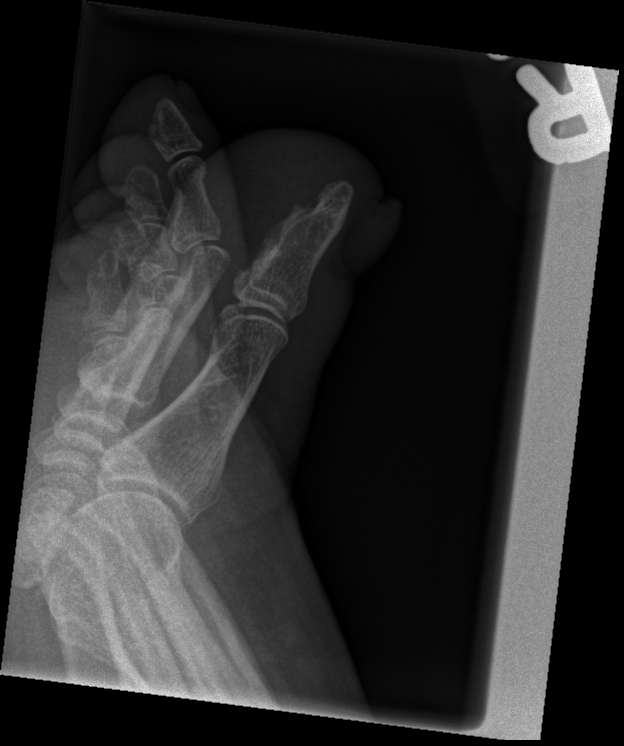

[3 of 3 positions shown; findings below may reference images not displayed]

FINDINGS: No fracture or malalignment. No soft tissue emphysema. Minimal
cortical irregularity at the tuft of the distal phalanx with some
lucency on the medial side
IMPRESSION: 1. No fracture
2. Questionable cortical lucency and irregularity medial and plantar
surface of the first distal phalanx, cannot exclude small foci of
osteomyelitis.

## 2019-01-02 ENCOUNTER — Telehealth: Payer: Self-pay | Admitting: *Deleted

## 2019-01-02 DIAGNOSIS — Z760 Encounter for issue of repeat prescription: Secondary | ICD-10-CM

## 2019-01-02 DIAGNOSIS — E1165 Type 2 diabetes mellitus with hyperglycemia: Secondary | ICD-10-CM

## 2019-01-02 MED ORDER — INSULIN GLARGINE 100 UNIT/ML SOLOSTAR PEN: 30.0000 [IU] | PEN_INJECTOR | Freq: Two times a day (BID) | SUBCUTANEOUS | 5 refills | Status: DC

## 2019-01-02 MED FILL — TRUEPLUS PEN NDL 31G X 1/4: 31G X 6 MM | 30 days supply | Qty: 100 | Fill #0

## 2019-01-02 MED FILL — TRUEPLUS PEN NDL 31G X 1/4": 31G X 6 MM | 30 days supply | Qty: 100 | Fill #0

## 2019-01-02 MED FILL — !LANTUS SOLOSTAR 100UNITS/M: 100 | 50 days supply | Qty: 30 | Fill #0

## 2019-01-02 NOTE — Telephone Encounter (Signed)
Patients insulin sig was changed based on PCP advise.

## 2019-01-03 MED FILL — !JANUVIA 100MG TABLET: 100 | 30 days supply | Qty: 30 | Fill #5

## 2019-01-18 ENCOUNTER — Ambulatory Visit: Payer: Self-pay

## 2019-01-18 MED FILL — HYDROCHLOROTHIAZIDE 12.5 MG: 12.5 | 30 days supply | Qty: 30 | Fill #1

## 2019-01-18 NOTE — Telephone Encounter (Signed)
Patient's daughter Bo Merino called and says her mother wanted her to call and let us know what's going on with her and to have the interpreter line call her mother back. I advised she is not on the DPR, so the mother would need to call us back. I asked has she attempted to call her PCP, she says no, because they don't answer, I advised her to have the patient to go to an UC, because we answer calls for the [facility] and we would be glad to talk to her, but our recommendation would be the same, she verbalized understanding and says she will let her mother know.

## 2019-01-24 MED FILL — ATORVASTATIN 20 MG TABLET: 20 | 30 days supply | Qty: 30 | Fill #4

## 2019-01-24 MED FILL — LOSARTAN POTASSIUM 50 MG TA: 50 | 30 days supply | Qty: 30 | Fill #5

## 2019-02-04 MED FILL — !JANUVIA 100MG TABLET: 100 | 30 days supply | Qty: 30 | Fill #0

## 2019-02-04 MED FILL — methIMAzole 5 MG TABS: 5 | 30 days supply | Qty: 30 | Fill #5

## 2019-02-08 ENCOUNTER — Encounter (HOSPITAL_COMMUNITY): Payer: Self-pay | Admitting: *Deleted

## 2019-02-08 ENCOUNTER — Emergency Department (HOSPITAL_COMMUNITY)
Admission: EM | Admit: 2019-02-08 | Discharge: 2019-02-08 | Disposition: A | Discharge status: Home / Self Care | Payer: Self-pay | Attending: Emergency Medicine | Admitting: Emergency Medicine

## 2019-02-08 ENCOUNTER — Other Ambulatory Visit: Payer: Self-pay

## 2019-02-08 DIAGNOSIS — H53149 Visual discomfort, unspecified: Secondary | ICD-10-CM | POA: Insufficient documentation

## 2019-02-08 DIAGNOSIS — R739 Hyperglycemia, unspecified: Secondary | ICD-10-CM

## 2019-02-08 DIAGNOSIS — I252 Old myocardial infarction: Secondary | ICD-10-CM | POA: Insufficient documentation

## 2019-02-08 DIAGNOSIS — Z7982 Long term (current) use of aspirin: Secondary | ICD-10-CM | POA: Insufficient documentation

## 2019-02-08 DIAGNOSIS — E1165 Type 2 diabetes mellitus with hyperglycemia: Secondary | ICD-10-CM | POA: Insufficient documentation

## 2019-02-08 DIAGNOSIS — R519 Headache, unspecified: Secondary | ICD-10-CM

## 2019-02-08 DIAGNOSIS — I10 Essential (primary) hypertension: Secondary | ICD-10-CM | POA: Insufficient documentation

## 2019-02-08 DIAGNOSIS — E079 Disorder of thyroid, unspecified: Secondary | ICD-10-CM | POA: Insufficient documentation

## 2019-02-08 DIAGNOSIS — Z79899 Other long term (current) drug therapy: Secondary | ICD-10-CM | POA: Insufficient documentation

## 2019-02-08 DIAGNOSIS — Z794 Long term (current) use of insulin: Secondary | ICD-10-CM | POA: Insufficient documentation

## 2019-02-08 DIAGNOSIS — R51 Headache: Secondary | ICD-10-CM | POA: Insufficient documentation

## 2019-02-08 LAB — URINALYSIS, ROUTINE W REFLEX MICROSCOPIC
Bilirubin Urine: NEGATIVE
Glucose, UA: >=500 mg/dL — AB
Hgb urine dipstick: NEGATIVE
Ketones, ur: NEGATIVE mg/dL
Nitrite: NEGATIVE
Protein, ur: NEGATIVE mg/dL
Specific Gravity, Urine: 1.012 (ref 1.005–1.030)
pH: 5 (ref 5.0–8.0)

## 2019-02-08 LAB — CBG MONITORING, ED
Glucose-Capillary: 405 mg/dL — ABNORMAL HIGH (ref 70–99)
Glucose-Capillary: 326 mg/dL — ABNORMAL HIGH (ref 70–99)

## 2019-02-08 LAB — BASIC METABOLIC PANEL
Anion gap: 13 (ref 5–15)
BUN: 46 mg/dL — ABNORMAL HIGH (ref 6–20)
CO2: 24 mmol/L (ref 22–32)
Calcium: 9.6 mg/dL (ref 8.9–10.3)
Chloride: 93 mmol/L — ABNORMAL LOW (ref 98–111)
Creatinine, Ser: 1.93 mg/dL — ABNORMAL HIGH (ref 0.44–1.00)
GFR calc Af Amer: 34 mL/min — ABNORMAL LOW (ref >60)
GFR calc non Af Amer: 29 mL/min — ABNORMAL LOW (ref >60)
Glucose, Bld: 481 mg/dL — ABNORMAL HIGH (ref 70–99)
Potassium: 4.4 mmol/L (ref 3.5–5.1)
Sodium: 130 mmol/L — ABNORMAL LOW (ref 135–145)

## 2019-02-08 LAB — CBC
HCT: 36.1 % (ref 36.0–46.0)
Hemoglobin: 12.3 g/dL (ref 12.0–15.0)
MCH: 27.6 pg (ref 26.0–34.0)
MCHC: 34.1 g/dL (ref 30.0–36.0)
MCV: 81.1 fL (ref 80.0–100.0)
Platelets: 308 10*3/uL (ref 150–400)
RBC: 4.45 MIL/uL (ref 3.87–5.11)
RDW: 11.9 % (ref 11.5–15.5)
WBC: 9.4 10*3/uL (ref 4.0–10.5)
nRBC: 0 % (ref 0.0–0.2)

## 2019-02-08 LAB — I-STAT BETA HCG BLOOD, ED (MC, WL, AP ONLY): I-stat hCG, quantitative: 5.4 m[IU]/mL — ABNORMAL HIGH (ref <5)

## 2019-02-08 MED ORDER — LACTATED RINGERS IV BOLUS
1000.0000 mL | Freq: Once | INTRAVENOUS | Status: AC
  Administered 2019-02-08: 1000 mL via INTRAVENOUS

## 2019-02-08 MED ORDER — ACETAMINOPHEN 325 MG PO TABS
650.0000 mg | ORAL_TABLET | Freq: Once | ORAL | Status: AC
  Administered 2019-02-08: 650 mg via ORAL
  Filled 2019-02-08: qty 2

## 2019-02-08 MED ORDER — DIPHENHYDRAMINE HCL 25 MG PO CAPS
25.0000 mg | ORAL_CAPSULE | Freq: Once | ORAL | Status: AC
  Administered 2019-02-08: 25 mg via ORAL
  Filled 2019-02-08: qty 1

## 2019-02-08 MED ORDER — PROCHLORPERAZINE EDISYLATE 10 MG/2ML IJ SOLN
10.0000 mg | Freq: Once | INTRAMUSCULAR | Status: AC
  Administered 2019-02-08: 10 mg via INTRAVENOUS
  Filled 2019-02-08: qty 2

## 2019-02-08 NOTE — ED Provider Notes (Signed)
[facility] EMERGENCY DEPARTMENT Provider Note   CSN: [id number] Arrival date & time: 02/08/19  1612    History   Chief Complaint Chief Complaint  Patient presents with   Hyperglycemia    HPI Rebecca [name] is a 53 y.o. female.  With past medical history of diabetes, hypertension who presents to the ED today with hyperglycemia and headache.  States this morning she woke up and did not feel well, checked her sugar and it was in the 600s.  Normally she gets herself 30 of Lantus twice daily however this morning gave her self 48 units at 11 AM.  She rechecked her sugar around 2 PM and when it remained elevated give herself another 60 units of Lantus.  Endorses a headache as well.  This headache is similar to prior headaches, was not maximal at onset and has improved since onset.      HPI  Past Medical History:  Diagnosis Date   Anemia    Diabetes mellitus    Dyslipidemia    Hypertension    Non-STEMI (non-ST elevated myocardial infarction) [facility])    Medical therapy for distal LAD dissection   Thyroid disease     Patient Active Problem List   Diagnosis Date Noted   Diarrhea    Hyperthyroidism    Insulin dependent diabetes mellitus (HCC)    Orthostasis    Vaginal discharge    Infectious diarrhea 11/26/2018   Hyperkalemia 11/26/2018   Bacteremia 06/21/2018   Sepsis secondary to UTI (HCC) 06/20/2018   Great toe amputation status, right 08/05/2017   Mild renal insufficiency 07/19/2017   AKI (acute kidney injury) (HCC) 03/04/2016   Essential hypertension 05/14/2015   CAD- with distal LAD disease, ? 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 05/14/2015   NSTEMI (non-ST elevated myocardial infarction) (HCC)    Syncope 05/13/2015   Hyperglycemia 10/25/2014   Abrasion of thigh, left 10/25/2014   DM (diabetes mellitus), type 2, uncontrolled (HCC)    DM hyperosmolar coma, type 2 (HCC) 05/22/2012   Hyperosmolar syndrome 05/21/2012    Past Surgical  History:  Procedure Laterality Date   AMPUTATION Right 07/21/2017   Procedure: RIGHT GREAT TOE AMPUTATION;  Surgeon: Nadara Mustard, MD;  Location: [facility] OR;  Service: Orthopedics;  Laterality: Right;   CARDIAC CATHETERIZATION N/A 05/14/2015   Procedure: Left Heart Cath and Coronary Angiography;  Surgeon: Marykay Lex, MD; dLAD 60%>>95% (small vessel, ?dissection), CFX & RCA systems no sig dz, EF & LVEDP nl        OB History    Gravida  4   Para      Term      Preterm      AB      Living  4     SAB      TAB      Ectopic      Multiple      Live Births  4            Home Medications    Prior to Admission medications   Medication Sig Start Date End Date Taking? Authorizing Provider  aspirin 81 MG EC tablet Take 1 tablet (81 mg total) by mouth daily. 08/09/18   Loletta Specter, PA-C  atorvastatin (LIPITOR) 20 MG tablet Take 1 tablet (20 mg total) by mouth at bedtime. 12/13/18   Grayce Sessions, NP  clotrimazole (GYNE-LOTRIMIN) 1 % vaginal cream Place 1 Applicatorful vaginally at bedtime. 11/28/18   Albertine Grates, MD  gabapentin (NEURONTIN) 600 MG  tablet Take 2 tablets (1,200 mg total) by mouth 3 (three) times daily. 12/13/18   Grayce SessionsEdwards, Michelle P, NP  hydrochlorothiazide (HYDRODIURIL) 12.5 MG tablet Take 1 tablet (12.5 mg total) by mouth daily for 30 days. 12/13/18 02/08/19  Grayce SessionsEdwards, Michelle P, NP  Insulin Glargine (LANTUS SOLOSTAR) 100 UNIT/ML Solostar Pen Inject 30 Units into the skin 2 (two) times daily for 30 days. 01/02/19 02/08/19  Grayce SessionsEdwards, Michelle P, NP  Insulin Pen Needle (PEN NEEDLES) 31G X 8 MM MISC Inject 1 each into the skin at bedtime. 12/13/18   Grayce SessionsEdwards, Michelle P, NP  losartan (COZAAR) 50 MG tablet Take 1 tablet (50 mg total) by mouth daily. 12/13/18   Grayce SessionsEdwards, Michelle P, NP  metFORMIN (GLUCOPHAGE XR) 500 MG 24 hr tablet Take 2 tablets (1,000 mg total) by mouth daily with breakfast. 12/13/18   Grayce SessionsEdwards, Michelle P, NP  methimazole (TAPAZOLE) 5 MG tablet Take 1  tablet (5 mg total) by mouth every Monday, Wednesday, and Friday. 11/28/18   Albertine GratesXu, Fang, MD  nitroGLYCERIN (NITROSTAT) 0.4 MG SL tablet Place 1 tablet (0.4 mg total) under the tongue every 5 (five) minutes as needed for chest pain. 04/12/18   Loletta SpecterGomez, Roger David, PA-C  polyethylene glycol [facility](MIRALAX / Ethelene HalGLYCOLAX) packet Take 17 g by mouth daily. 11/28/18   Albertine GratesXu, Fang, MD  senna-docusate (SENOKOT-S) 8.6-50 MG tablet Take 1 tablet by mouth at bedtime. 11/28/18   Albertine GratesXu, Fang, MD  sitaGLIPtin (JANUVIA) 100 MG tablet Take 1 tablet (100 mg total) by mouth daily. 12/13/18   Grayce SessionsEdwards, Michelle P, NP    Family History Family History  Problem Relation Age of Onset   Hypertension Father    Hyperlipidemia Father    Cancer Mother    Diabetes Sister    Diabetes Brother    Diabetes Sister    Diabetes Sister    Diabetes Sister    Diabetes Brother    Diabetes Brother    Diabetes Brother    Diabetes Brother     Social History Social History   Tobacco Use   Smoking status: Never Smoker   Smokeless tobacco: Never Used  Substance Use Topics   Alcohol use: No   Drug use: No     Allergies   Patient has no known allergies.   Review of Systems Review of Systems  Constitutional: Negative for activity change, appetite change, chills, fatigue and fever.  HENT: Negative for ear pain and sore throat.   Eyes: Positive for photophobia. Negative for pain and visual disturbance.  Respiratory: Negative for cough, chest tightness and shortness of breath.   Cardiovascular: Negative for chest pain and palpitations.  Gastrointestinal: Negative for abdominal pain and vomiting.  Genitourinary: Negative for dysuria and hematuria.  Musculoskeletal: Negative for arthralgias and back pain.  Skin: Negative for color change and rash.  Neurological: Positive for headaches. Negative for seizures, syncope, weakness and numbness.  All other systems reviewed and are negative.    Physical Exam Updated Vital Signs BP  (!) 158/94    Pulse 85    Temp 98.1 F (36.7 C) (Oral)    Resp 11    Ht 5\' 2"  (1.575 m)    Wt 81.6 kg    LMP  (LMP Unknown)    SpO2 100%    BMI 32.92 kg/m   Physical Exam Vitals signs and nursing note reviewed.  Constitutional:      General: She is not in acute distress.    Appearance: She is well-developed. She is not ill-appearing.  HENT:     Head: Normocephalic and atraumatic.  Eyes:     Conjunctiva/sclera: Conjunctivae normal.  Neck:     Musculoskeletal: Neck supple.  Cardiovascular:     Rate and Rhythm: Normal rate and regular rhythm.     Heart sounds: No murmur.  Pulmonary:     Effort: Pulmonary effort is normal. No respiratory distress.     Breath sounds: Normal breath sounds.  Abdominal:     Palpations: Abdomen is soft.     Tenderness: There is no abdominal tenderness.  Skin:    General: Skin is warm and dry.  Neurological:     General: No focal deficit present.     Mental Status: She is alert and oriented to person, place, and time.     Cranial Nerves: No cranial nerve deficit.     Sensory: No sensory deficit.     Motor: No weakness.     Coordination: Coordination normal.     Gait: Gait normal.  Psychiatric:        Mood and Affect: Mood normal.        Behavior: Behavior normal.      ED Treatments / Results  Labs (all labs ordered are listed, but only abnormal results are displayed) Labs Reviewed  BASIC METABOLIC PANEL - Abnormal; Notable for the following components:      Result Value   Sodium 130 (*)    Chloride 93 (*)    Glucose, Bld 481 (*)    BUN 46 (*)    Creatinine, Ser 1.93 (*)    GFR calc non Af Amer 29 (*)    GFR calc Af Amer 34 (*)    All other components within normal limits  URINALYSIS, ROUTINE W REFLEX MICROSCOPIC - Abnormal; Notable for the following components:   Glucose, UA >=500 (*)    Leukocytes,Ua TRACE (*)    Bacteria, UA RARE (*)    All other components within normal limits  CBG MONITORING, ED - Abnormal; Notable for the  following components:   Glucose-Capillary 405 (*)    All other components within normal limits  I-STAT BETA HCG BLOOD, ED ([facility], [facility], [facility] ONLY) - Abnormal; Notable for the following components:   I-stat hCG, quantitative 5.4 (*)    All other components within normal limits  CBG MONITORING, ED - Abnormal; Notable for the following components:   Glucose-Capillary 326 (*)    All other components within normal limits  CBC    EKG None  Radiology No results found.  Procedures Procedures (including critical care time)  Medications Ordered in ED Medications  lactated ringers bolus 1,000 mL (0 mLs Intravenous Stopped 02/08/19 2233)  lactated ringers bolus 1,000 mL (1,000 mLs Intravenous New Bag/Given 02/08/19 2131)  prochlorperazine (COMPAZINE) injection 10 mg (10 mg Intravenous Given 02/08/19 2123)  diphenhydrAMINE (BENADRYL) capsule 25 mg (25 mg Oral Given 02/08/19 2123)  acetaminophen (TYLENOL) tablet 650 mg (650 mg Oral Given 02/08/19 2123)     Initial Impression / Assessment and Plan / ED Course  I have reviewed the triage vital signs and the nursing notes.  Pertinent labs & imaging results that were available during my care of the patient were reviewed by me and considered in my medical decision making (see chart for details).        Rebecca [name] is a 53 y.o. female.  With past medical history of diabetes, hypertension who presents to the ED today with hyperglycemia and headache for one day.   On initial  exam patient well appearing, not in acute distress. VSS. Physical exam as above shows a normal, non-focal neuro exam.    In regards to her hyperglycemia:  Patient states it was 600 this morning.  Since then she has taken a total of 100 Lantus.  Normal daily total Lantus to 60.  Initially on arrival patient's glucose was in the 400s. IT has improved to the low 300s.  Will give short acting insulin as patient took long-acting expect her glucose to improve.  Labs are not  consistent with DKA.  She does have a mild AKI.  Creatinine is elevated at 1.9 last creatinine was 1.3.  BUN is elevated.  AKI is likely secondary to dehydration in the setting of hyperglycemia.  Patient no history of CHF.  Fluid resuscitated with 2 L LR.  In regards to her headache:  Meningitis is unlikely due to no fever, neck stiffness or meningeal signs Encephalitis is unlikely due to no fever or AMS SAH is unlikely due to this being similar to previous HA and no sudden onset SDH is unlikely because there was no trauma, the patient is not elderly or an alcoholic Epidural hematoma is unlikely because there was no trauma Carotid artery dissection is unlikely because there is no unilateral neck pain or associated trauma Hypertensive encephalitis is unlikely because there are no neuro symptoms or HTN Temporal arteritis/GCA is unlikely because the temporal area is not tender to palpation, and the patient is <55y/o Acute angle glaucoma is unlikely because there is no unilateral blurry vision or a fixed pupil   Patient given IV Compazine, p.o. Benadryl.  On reassessment patient with resolution of symptoms.  At this time she is requesting to go home. Feel patient is stable for discharge home. Advised patient that she will need close PCP follow-up given her hyperglycemia and mild AKI.  Patient states she will call her doctor tomorrow to set up an appointment.  All questions answered and patient discharged home in stable condition.  Strict return precautions given.   Spanish interpreter was used for this encounter.  Final Clinical Impressions(s) / ED Diagnoses   Final diagnoses:  Hyperglycemia  Nonintractable headache, unspecified chronicity pattern, unspecified headache type    ED Discharge Orders    None       Dicky Doe, MD 02/08/19 2322    Pricilla Loveless, MD 02/10/19 [phone]

## 2019-02-08 NOTE — ED Notes (Signed)
Pt here with hyperglycemia, some generalized fatigue

## 2019-02-08 NOTE — ED Triage Notes (Signed)
Pt states that she could not get her sugar down and that it has been in the 600's. Pt states that she has been taking her insulin as prescribed.  She states that she gave herself 40units of Lantus at 11am and when the blood sugar remained unchanged she gave herself 60units of Lantus at 2pm.  Normally she takes 30units twice a day.  Pt states that she has had a HA and pressure behind her eyes but no fever cough, URI, n/v/d.  In addition to the lantus pt takes [place] po. Pt states that she has been taking both as prescribed and the last time she checked her sugar about a week ago and it was normal.  Pt states that she checked today due to feeling unwell.   Pt is alert and oriented, translator app used.

## 2019-02-10 ENCOUNTER — Other Ambulatory Visit (INDEPENDENT_AMBULATORY_CARE_PROVIDER_SITE_OTHER): Payer: Self-pay | Admitting: Primary Care

## 2019-02-10 DIAGNOSIS — E1165 Type 2 diabetes mellitus with hyperglycemia: Secondary | ICD-10-CM

## 2019-02-10 DIAGNOSIS — Z760 Encounter for issue of repeat prescription: Secondary | ICD-10-CM

## 2019-02-14 ENCOUNTER — Other Ambulatory Visit: Payer: Self-pay | Admitting: Primary Care

## 2019-02-14 MED FILL — GABAPENTIN 600 MG TABLET: 600 | 30 days supply | Qty: 180 | Fill #0

## 2019-02-20 MED FILL — HYDROCHLOROTHIAZIDE 12.5 MG: 12.5 | 30 days supply | Qty: 30 | Fill #2

## 2019-02-21 MED FILL — !LANTUS SOLOSTAR 100UNITS/M: 100 | 50 days supply | Qty: 30 | Fill #1

## 2019-02-27 MED FILL — LOSARTAN POTASSIUM 50 MG TA: 50 | 30 days supply | Qty: 30 | Fill #1

## 2019-03-06 MED FILL — !JANUVIA 100MG TABLET: 100 | 30 days supply | Qty: 30 | Fill #1

## 2019-03-06 MED FILL — ATORVASTATIN 20 MG TABLET: 20 | 30 days supply | Qty: 30 | Fill #5

## 2019-03-15 ENCOUNTER — Ambulatory Visit (INDEPENDENT_AMBULATORY_CARE_PROVIDER_SITE_OTHER): Payer: Self-pay | Admitting: Primary Care

## 2019-03-22 ENCOUNTER — Ambulatory Visit (INDEPENDENT_AMBULATORY_CARE_PROVIDER_SITE_OTHER): Payer: Self-pay | Admitting: Primary Care

## 2019-03-22 ENCOUNTER — Other Ambulatory Visit: Payer: Self-pay

## 2019-03-22 ENCOUNTER — Encounter (INDEPENDENT_AMBULATORY_CARE_PROVIDER_SITE_OTHER): Payer: Self-pay | Admitting: Primary Care

## 2019-03-22 VITALS — BP 148/92 | HR 90 | Temp 99.1°F | Ht 62.0 in | Wt 176.8 lb

## 2019-03-22 DIAGNOSIS — E1165 Type 2 diabetes mellitus with hyperglycemia: Secondary | ICD-10-CM

## 2019-03-22 DIAGNOSIS — E119 Type 2 diabetes mellitus without complications: Secondary | ICD-10-CM

## 2019-03-22 DIAGNOSIS — Z794 Long term (current) use of insulin: Secondary | ICD-10-CM

## 2019-03-22 DIAGNOSIS — I10 Essential (primary) hypertension: Secondary | ICD-10-CM

## 2019-03-22 DIAGNOSIS — E7841 Elevated Lipoprotein(a): Secondary | ICD-10-CM

## 2019-03-22 DIAGNOSIS — Z760 Encounter for issue of repeat prescription: Secondary | ICD-10-CM

## 2019-03-22 DIAGNOSIS — R739 Hyperglycemia, unspecified: Secondary | ICD-10-CM

## 2019-03-22 LAB — GLUCOSE, POCT (MANUAL RESULT ENTRY)
POC Glucose: 335 mg/dl — AB (ref 70–99)
POC Glucose: 376 mg/dl — AB (ref 70–99)

## 2019-03-22 LAB — POCT URINALYSIS DIP (CLINITEK)
Bilirubin, UA: NEGATIVE
Blood, UA: NEGATIVE
Glucose, UA: >=1000 mg/dL — AB
Ketones, POC UA: NEGATIVE mg/dL
Leukocytes, UA: NEGATIVE
Nitrite, UA: NEGATIVE
Spec Grav, UA: 1.01 (ref 1.010–1.025)
Urobilinogen, UA: 0.2 E.U./dL
pH, UA: 5 (ref 5.0–8.0)

## 2019-03-22 LAB — POCT GLYCOSYLATED HEMOGLOBIN (HGB A1C): Hemoglobin A1C: 14.5 % — AB (ref 4.0–5.6)

## 2019-03-22 MED ORDER — METFORMIN HCL ER 500 MG PO TB24: 1000.0000 mg | ORAL_TABLET | Freq: Two times a day (BID) | ORAL | 3 refills | Status: DC

## 2019-03-22 MED ORDER — ATORVASTATIN CALCIUM 20 MG PO TABS: 20.0000 mg | ORAL_TABLET | Freq: Every day | ORAL | 3 refills | Status: DC

## 2019-03-22 MED ORDER — SITAGLIPTIN PHOSPHATE 100 MG PO TABS: 100.0000 mg | ORAL_TABLET | Freq: Every day | ORAL | 3 refills | Status: DC

## 2019-03-22 MED ORDER — LOSARTAN POTASSIUM 50 MG PO TABS: 50.0000 mg | ORAL_TABLET | Freq: Every day | ORAL | 3 refills | Status: DC

## 2019-03-22 MED ORDER — LANTUS SOLOSTAR 100 UNIT/ML ~~LOC~~ SOPN: 34.0000 [IU] | PEN_INJECTOR | Freq: Two times a day (BID) | SUBCUTANEOUS | 3 refills | Status: DC

## 2019-03-22 MED ORDER — PEN NEEDLES 31G X 8 MM MISC: 1.0000 | Freq: Every day | 5 refills | Status: DC

## 2019-03-22 MED ORDER — INSULIN ASPART 100 UNIT/ML ~~LOC~~ SOLN
15.0000 [IU] | Freq: Once | SUBCUTANEOUS | Status: AC
  Administered 2019-03-22: 15 [IU] via SUBCUTANEOUS

## 2019-03-22 MED ORDER — INSULIN ASPART 100 UNIT/ML ~~LOC~~ SOLN: 15.0000 [IU] | Freq: Three times a day (TID) | SUBCUTANEOUS | 0 refills | Status: DC

## 2019-03-22 MED ORDER — HYDROCHLOROTHIAZIDE 12.5 MG PO TABS: 12.5000 mg | ORAL_TABLET | Freq: Every day | ORAL | 3 refills | Status: DC

## 2019-03-22 MED FILL — TRUEPLUS PEN NDL 31GX5/16": 31G X 8 MM | 25 days supply | Qty: 100 | Fill #0

## 2019-03-22 MED FILL — HYDROCHLOROTHIAZIDE 12.5 MG: 12.5 | 30 days supply | Qty: 30 | Fill #0

## 2019-03-22 MED FILL — !LANTUS SOLOSTAR 100UNITS/M: 100 | 22 days supply | Qty: 15 | Fill #0

## 2019-03-22 MED FILL — metFORMIN HCL ER 500 MG TB2: 500 | 30 days supply | Qty: 120 | Fill #0

## 2019-03-22 MED FILL — TRUEPLUS PEN NDL 31GX5/16: 31G X 8 MM | 25 days supply | Qty: 100 | Fill #0

## 2019-03-22 MED FILL — NovoLOG 100 UNIT/ML SOLN: 100 | 22 days supply | Qty: 10 | Fill #0

## 2019-03-22 NOTE — Progress Notes (Signed)
Established Patient Office Visit  Subjective:  Patient ID: Rebecca [name], female    DOB: 04/14/1966  Age: 53 y.o. MRN: [id number]  CC:  Chief Complaint  Patient presents with  . Follow-up    HTN/DM  . Medication Refill    methimazole    HPI Rebecca [name] presents for management of chronic diseases to include diabetes type 2 with a A1c of 14.5 indicating noncompliance, hypertension, thyroid disease which needs to be managed by endocrinology, hyperlipidemia and anemia.  She denies  shortness of breath, headaches, chest pain or lower extremity edema  Past Medical History:  Diagnosis Date  . Anemia   . Diabetes mellitus   . Dyslipidemia   . Hypertension   . Non-STEMI (non-ST elevated myocardial infarction) [facility](HCC)    Medical therapy for distal LAD dissection  . Thyroid disease     Past Surgical History:  Procedure Laterality Date  . AMPUTATION Right 07/21/2017   Procedure: RIGHT GREAT TOE AMPUTATION;  Surgeon: Nadara Mustarduda, Marcus V, MD;  [facility];  Service: Orthopedics;  Laterality: Right;  . CARDIAC CATHETERIZATION N/A 05/14/2015   Procedure: Left Heart Cath and Coronary Angiography;  Surgeon: Marykay Lexavid W Harding, MD; dLAD 60%>>95% (small vessel, ?dissection), CFX & RCA systems no sig dz, EF & LVEDP nl       Family History  Problem Relation Age of Onset  . Hypertension Father   . Hyperlipidemia Father   . Cancer Mother   . Diabetes Sister   . Diabetes Brother   . Diabetes Sister   . Diabetes Sister   . Diabetes Sister   . Diabetes Brother   . Diabetes Brother   . Diabetes Brother   . Diabetes Brother     Social History   Socioeconomic History  . Marital status: Single    Spouse name: Not on file  . Number of children: Not on file  . Years of education: Not on file  . Highest education level: Not on file  Occupational History  . Occupation: unemployed  Social Needs  . Financial resource strain: Not on file  . Food insecurity    Worry: Not on file   Inability: Not on file  . Transportation needs    Medical: Not on file    Non-medical: Not on file  Tobacco Use  . Smoking status: Never Smoker  . Smokeless tobacco: Never Used  Substance and Sexual Activity  . Alcohol use: No  . Drug use: No  . Sexual activity: Yes  Lifestyle  . Physical activity    Days per week: Not on file    Minutes per session: Not on file  . Stress: Not on file  Relationships  . Social Musicianconnections    Talks on phone: Not on file    Gets together: Not on file    Attends religious service: Not on file    Active member of club or organization: Not on file    Attends meetings of clubs or organizations: Not on file    Relationship status: Not on file  . Intimate partner violence    Fear of current or ex partner: Not on file    Emotionally abused: Not on file    Physically abused: Not on file    Forced sexual activity: Not on file  Other Topics Concern  . Not on file  Social History Narrative  . Not on file    Outpatient Medications Prior to Visit  Medication Sig Dispense Refill  . aspirin  81 MG EC tablet Take 1 tablet (81 mg total) by mouth daily. 90 tablet 1  . atorvastatin (LIPITOR) 20 MG tablet Take 1 tablet (20 mg total) by mouth at bedtime. 30 tablet 5  . gabapentin (NEURONTIN) 600 MG tablet TAKE 2 TABLETS BY MOUTH 3 TIMES DAILY. 180 tablet 5  . Insulin Pen Needle (PEN NEEDLES) 31G X 8 MM MISC Inject 1 each into the skin at bedtime. 30 each 5  . losartan (COZAAR) 50 MG tablet Take 1 tablet (50 mg total) by mouth daily. 30 tablet 5  . metFORMIN (GLUCOPHAGE XR) 500 MG 24 hr tablet Take 2 tablets (1,000 mg total) by mouth daily with breakfast. 60 tablet 5  . methimazole (TAPAZOLE) 5 MG tablet Take 1 tablet (5 mg total) by mouth every Monday, Wednesday, and Friday. 90 tablet 3  . polyethylene glycol (MIRALAX / GLYCOLAX) packet Take 17 g by mouth daily. 14 each 0  . sitaGLIPtin (JANUVIA) 100 MG tablet Take 1 tablet (100 mg total) by mouth daily. 30  tablet 5  . hydrochlorothiazide (HYDRODIURIL) 12.5 MG tablet Take 1 tablet (12.5 mg total) by mouth daily for 30 days. 30 tablet 3  . Insulin Glargine (LANTUS SOLOSTAR) 100 UNIT/ML Solostar Pen Inject 30 Units into the skin 2 (two) times daily for 30 days. 60 pen 5  . nitroGLYCERIN (NITROSTAT) 0.4 MG SL tablet Place 1 tablet (0.4 mg total) under the tongue every 5 (five) minutes as needed for chest pain. 25 tablet 3  . clotrimazole (GYNE-LOTRIMIN) 1 % vaginal cream Place 1 Applicatorful vaginally at bedtime. 45 g 0  . senna-docusate (SENOKOT-S) 8.6-50 MG tablet Take 1 tablet by mouth at bedtime. 30 tablet 0   No facility-administered medications prior to visit.     No Known Allergies  ROS Review of Systems  Constitutional: Negative.   HENT: Negative.   Cardiovascular: Positive for leg swelling.       Pain  Gastrointestinal: Positive for abdominal distention.  Musculoskeletal: Positive for arthralgias.      Objective:    Physical Exam  Constitutional: She appears well-developed and well-nourished.  HENT:  Head: Normocephalic.  Neck: Normal range of motion.  Cardiovascular: Normal rate and regular rhythm.  Pulmonary/Chest: Effort normal and breath sounds normal.  Abdominal: Bowel sounds are normal. She exhibits distension.  Musculoskeletal: Normal range of motion.  Skin: Skin is warm.  Psychiatric: She has a normal mood and affect.    BP (!) 148/92 (BP Location: Right Arm, Patient Position: Sitting, Cuff Size: Normal)   Pulse 90   Temp 99.1 F (37.3 C) (Oral)   Ht 5\' 2"  (1.575 m)   Wt 176 lb 12.8 oz (80.2 kg)   LMP  (LMP Unknown)   SpO2 99%   BMI 32.34 kg/m  Wt Readings from Last 3 Encounters:  03/22/19 176 lb 12.8 oz (80.2 kg)  02/08/19 180 lb (81.6 kg)  12/13/18 178 lb 3.2 oz (80.8 kg)     Health Maintenance Due  Topic Date Due  . OPHTHALMOLOGY EXAM  12/24/1975  . Fecal DNA (Cologuard)  12/24/2015  . PAP SMEAR-Modifier  08/06/2018    There are no  preventive care reminders to display for this patient.  Lab Results  Component Value Date   TSH 5.512 (H) 11/27/2018   Lab Results  Component Value Date   WBC 9.4 02/08/2019   HGB 12.3 02/08/2019   HCT 36.1 02/08/2019   MCV 81.1 02/08/2019   PLT 308 02/08/2019   Lab Results  Component Value Date   NA 130 (L) 02/08/2019   K 4.4 02/08/2019   CO2 24 02/08/2019   GLUCOSE 481 (H) 02/08/2019   BUN 46 (H) 02/08/2019   CREATININE 1.93 (H) 02/08/2019   BILITOT 0.4 12/13/2018   ALKPHOS 167 (H) 12/13/2018   AST 24 12/13/2018   ALT 31 12/13/2018   PROT 7.5 12/13/2018   ALBUMIN 4.5 12/13/2018   CALCIUM 9.6 02/08/2019   ANIONGAP 13 02/08/2019   Lab Results  Component Value Date   CHOL 173 12/13/2018   Lab Results  Component Value Date   HDL 39 (L) 12/13/2018   Lab Results  Component Value Date   LDLCALC 99 12/13/2018   Lab Results  Component Value Date   TRIG 173 (H) 12/13/2018   Lab Results  Component Value Date   CHOLHDL 4.4 12/13/2018   Lab Results  Component Value Date   HGBA1C 14.5 (A) 03/22/2019      Assessment & Plan:   Problem List Items Addressed This Visit    DM (diabetes mellitus), type 2, uncontrolled (HCC) - Primary (Chronic)   Relevant Orders   Glucose (CBG) (Completed)   HgB A1c (Completed)    Teri was seen today for follow-up and medication refill.  Diagnoses and all orders for this visit:  Uncontrolled type 2 diabetes mellitus with hyperglycemia (HCC) She will be referred to clinical pharmacist for tighter control with her diabetes. The ADA recommends the following therapeutic goals for glycemic control related to A1c measurements: Goal of therapy: Less than 6.5 hemoglobin A1c.  Reference clinical practice recommendations. Foods that are high in carbohydrates are the following rice, potatoes, breads, sugars, and pastas.  Reduction in the intake (eating) will assist in lowering your blood sugars. -     Glucose (CBG) -     HgB A1c -      insulin aspart (novoLOG) injection 15 Units -     Insulin Glargine (LANTUS SOLOSTAR) 100 UNIT/ML Solostar Pen; Inject 34 Units into the skin 2 (two) times daily. -     Insulin Pen Needle (PEN NEEDLES) 31G X 8 MM MISC; Inject 1 each into the skin at bedtime.  Medication refill -     Insulin Glargine (LANTUS SOLOSTAR) 100 UNIT/ML Solostar Pen; Inject 34 Units into the skin 2 (two) times daily. -     losartan (COZAAR) 50 MG tablet; Take 1 tablet (50 mg total) by mouth daily. -     atorvastatin (LIPITOR) 20 MG tablet; Take 1 tablet (20 mg total) by mouth at bedtime. -     sitaGLIPtin (JANUVIA) 100 MG tablet; Take 1 tablet (100 mg total) by mouth daily. -     metFORMIN (GLUCOPHAGE XR) 500 MG 24 hr tablet; Take 2 tablets (1,000 mg total) by mouth 2 (two) times daily after a meal. -     Insulin Pen Needle (PEN NEEDLES) 31G X 8 MM MISC; Inject 1 each into the skin at bedtime.  Hypertension, unspecified type Counseled on blood pressure goal of less than 130/80, low-sodium, DASH diet, medication compliance, 150 minutes of moderate intensity exercise per week. Discussed medication compliance, adverse effects. -     losartan (COZAAR) 50 MG tablet; Take 1 tablet (50 mg total) by mouth daily.  Elevated lipoprotein(a)  Healthy lifestyle diet of fruits vegetables fish nuts whole grains and low saturated fat . Foods high in cholesterol or liver, fatty meats,cheese, butter avocados, nuts and seeds, chocolate and fried foods. -     atorvastatin (LIPITOR) 20  MG tablet; Take 1 tablet (20 mg total) by mouth at bedtime.  Type 2 diabetes mellitus without complication, with long-term current use of insulin (HCC) -     sitaGLIPtin (JANUVIA) 100 MG tablet; Take 1 tablet (100 mg total) by mouth daily. -     metFORMIN (GLUCOPHAGE XR) 500 MG 24 hr tablet; Take 2 tablets (1,000 mg total) by mouth 2 (two) times daily after a meal.  Other orders -     insulin aspart (NOVOLOG) 100 UNIT/ML injection; Inject 15 Units into the  skin 3 (three) times daily before meals. -     hydrochlorothiazide (HYDRODIURIL) 12.5 MG tablet; Take 1 tablet (12.5 mg total) by mouth daily.    No orders of the defined types were placed in this encounter.   Follow-up: No follow-ups on file.    Grayce SessionsMichelle P Daquisha Clermont, NP

## 2019-03-22 NOTE — Progress Notes (Signed)
Pt complains that her feet are swelling and at times the pain is unbearable

## 2019-03-23 MED FILL — methIMAzole 5 MG TABS: 5 | 30 days supply | Qty: 30 | Fill #0

## 2019-03-23 MED FILL — HYDROCHLOROTHIAZIDE 12.5 MG: 12.5 | 30 days supply | Qty: 30 | Fill #3

## 2019-04-04 MED FILL — LOSARTAN POTASSIUM 50 MG TA: 50 | 30 days supply | Qty: 30 | Fill #2

## 2019-04-04 MED FILL — GABAPENTIN 600 MG TABLET: 600 | 30 days supply | Qty: 180 | Fill #1

## 2019-04-10 MED FILL — ATORVASTATIN 20 MG TABLET: 20 | 30 days supply | Qty: 30 | Fill #0

## 2019-04-18 ENCOUNTER — Telehealth (INDEPENDENT_AMBULATORY_CARE_PROVIDER_SITE_OTHER): Payer: Self-pay | Admitting: Primary Care

## 2019-04-18 ENCOUNTER — Other Ambulatory Visit: Payer: Self-pay

## 2019-04-18 ENCOUNTER — Encounter (INDEPENDENT_AMBULATORY_CARE_PROVIDER_SITE_OTHER): Payer: Self-pay | Admitting: Primary Care

## 2019-04-18 DIAGNOSIS — E1165 Type 2 diabetes mellitus with hyperglycemia: Secondary | ICD-10-CM

## 2019-04-18 DIAGNOSIS — Z91199 Patient's noncompliance with other medical treatment and regimen due to unspecified reason: Secondary | ICD-10-CM

## 2019-04-18 DIAGNOSIS — Z9119 Patient's noncompliance with other medical treatment and regimen: Secondary | ICD-10-CM

## 2019-04-18 DIAGNOSIS — E1142 Type 2 diabetes mellitus with diabetic polyneuropathy: Secondary | ICD-10-CM

## 2019-04-18 NOTE — Progress Notes (Signed)
Virtual Visit via Telephone Note  I connected with Rebecca [name] on 04/18/19 at  8:50 AM EDT by telephone and verified that I am speaking with the correct person using two identifiers.   I discussed the limitations, risks, security and privacy concerns of performing an evaluation and management service by telephone and the availability of in person appointments. I also discussed with the patient that there may be a patient responsible charge related to this service. The patient expressed understanding and agreed to proceed.   History of Present Illness: Rebecca [name] was having a web visit for diabetic foot ulcers . Although we were on web cam she was not able to show me her feet . Explains via interpretor bilateral purple discoloration, left big toe has a spot on it and right middle of the foot on top is swollen but she denies any open areas.    Observations/Objective: Review of Systems  Skin: Positive for itching.       Discoloration on feet no open areas  Neurological: Positive for tingling.    Assessment and Plan: Diagnoses and all orders for this visit:  Uncontrolled type 2 diabetes mellitus with hyperglycemia ([place]) ADA recommends the following therapeutic goals for glycemic control related to A1c measurements: Goal of therapy: Less than 6.5 hemoglobin A1c.  Reference clinical practice recommendations. Foods that are high in carbohydrates are the following rice, potatoes, breads, sugars, and pastas.  Reduction in the intake (eating) will assist in lowering your blood sugars.  Diabetic polyneuropathy associated with type 2 diabetes mellitus (HCC) A1C 14 up from 13 3 months ago. Continue gabapentin as prescribed   Noncompliance Concern is patient is actually taking medication for diabetes medication as prescribed - A1C is climbing up instead of down. Explained risk of uncontrolled diabetes circulation compromised and amputation can take place or renal failure or loss of sight patient  she understands    Follow Up Instructions:    I discussed the assessment and treatment plan with the patient. The patient was provided an opportunity to ask questions and all were answered. The patient agreed with the plan and demonstrated an understanding of the instructions.   The patient was advised to call back or seek an in-person evaluation if the symptoms worsen or if the condition fails to improve as anticipated.  I provided 18 minutes of non-face-to-face time during this encounter.   Kerin Perna, NP

## 2019-04-26 ENCOUNTER — Other Ambulatory Visit (INDEPENDENT_AMBULATORY_CARE_PROVIDER_SITE_OTHER): Payer: Self-pay | Admitting: Primary Care

## 2019-04-26 MED ORDER — INSULIN ASPART 100 UNIT/ML ~~LOC~~ SOLN: 15.0000 [IU] | Freq: Three times a day (TID) | SUBCUTANEOUS | 0 refills | Status: DC

## 2019-04-27 MED FILL — !LANTUS SOLOSTAR 100UNITS/M: 100 | 17 days supply | Qty: 12 | Fill #1

## 2019-04-27 MED FILL — NovoLOG 100 UNIT/ML SOLN: 100 | 22 days supply | Qty: 10 | Fill #0

## 2019-05-03 MED FILL — GABAPENTIN 600 MG TABLET: 600 | 30 days supply | Qty: 180 | Fill #2

## 2019-05-09 MED FILL — LOSARTAN POTASSIUM 50 MG TA: 50 | 30 days supply | Qty: 30 | Fill #0

## 2019-05-11 MED FILL — ATORVASTATIN 20 MG TABLET: 20 | 30 days supply | Qty: 30 | Fill #1

## 2019-05-26 MED FILL — HYDROCHLOROTHIAZIDE 12.5 MG: 12.5 | 30 days supply | Qty: 30 | Fill #1

## 2019-05-26 MED FILL — !LANTUS SOLOSTAR 100UNITS/M: 100 | 17 days supply | Qty: 12 | Fill #2

## 2019-05-30 ENCOUNTER — Telehealth (INDEPENDENT_AMBULATORY_CARE_PROVIDER_SITE_OTHER): Payer: Self-pay | Admitting: Primary Care

## 2019-05-30 ENCOUNTER — Other Ambulatory Visit (INDEPENDENT_AMBULATORY_CARE_PROVIDER_SITE_OTHER): Payer: Self-pay | Admitting: Primary Care

## 2019-05-30 DIAGNOSIS — E0590 Thyrotoxicosis, unspecified without thyrotoxic crisis or storm: Secondary | ICD-10-CM

## 2019-05-30 MED FILL — methIMAzole 5 MG TABS: 5 | 30 days supply | Qty: 12 | Fill #1

## 2019-05-30 NOTE — Telephone Encounter (Signed)
Denied need TSH/T4

## 2019-05-30 NOTE — Telephone Encounter (Signed)
1) Medication(s) Requested (by name): -methimazole (TAPAZOLE) 5 MG tablet   2) Pharmacy of Choice: -[place], [place] 3) Special Requests:   Approved medications will be sent to the pharmacy, we will reach out if there is an issue.  Requests made after 3pm may not be addressed until the following business day!

## 2019-05-30 NOTE — Telephone Encounter (Signed)
Please call patient and schedule appointment. In person

## 2019-05-30 NOTE — Telephone Encounter (Signed)
FWD to PCP. Twyla Dais S Andreya Lacks, CMA  

## 2019-05-31 MED FILL — GABAPENTIN 600 MG TABLET: 600 | 30 days supply | Qty: 180 | Fill #3

## 2019-06-15 MED FILL — ATORVASTATIN 20 MG TABLET: 20 | 30 days supply | Qty: 30 | Fill #2

## 2019-06-19 MED FILL — LOSARTAN POTASSIUM 50 MG TA: 50 | 30 days supply | Qty: 30 | Fill #1

## 2019-06-19 MED FILL — !LANTUS SOLOSTAR 100UNITS/M: 100 | 17 days supply | Qty: 12 | Fill #3

## 2019-06-22 ENCOUNTER — Encounter (INDEPENDENT_AMBULATORY_CARE_PROVIDER_SITE_OTHER): Payer: Self-pay | Admitting: Primary Care

## 2019-06-22 ENCOUNTER — Ambulatory Visit (INDEPENDENT_AMBULATORY_CARE_PROVIDER_SITE_OTHER): Payer: Self-pay | Admitting: Primary Care

## 2019-06-22 ENCOUNTER — Other Ambulatory Visit: Payer: Self-pay

## 2019-06-22 VITALS — BP 140/78 | HR 89 | Temp 97.2°F | Ht 62.0 in | Wt 181.2 lb

## 2019-06-22 DIAGNOSIS — B351 Tinea unguium: Secondary | ICD-10-CM

## 2019-06-22 DIAGNOSIS — L97411 Non-pressure chronic ulcer of right heel and midfoot limited to breakdown of skin: Secondary | ICD-10-CM

## 2019-06-22 DIAGNOSIS — E1165 Type 2 diabetes mellitus with hyperglycemia: Secondary | ICD-10-CM

## 2019-06-22 DIAGNOSIS — E7841 Elevated Lipoprotein(a): Secondary | ICD-10-CM

## 2019-06-22 DIAGNOSIS — E1169 Type 2 diabetes mellitus with other specified complication: Secondary | ICD-10-CM

## 2019-06-22 DIAGNOSIS — I10 Essential (primary) hypertension: Secondary | ICD-10-CM

## 2019-06-22 DIAGNOSIS — E785 Hyperlipidemia, unspecified: Secondary | ICD-10-CM

## 2019-06-22 DIAGNOSIS — Z760 Encounter for issue of repeat prescription: Secondary | ICD-10-CM

## 2019-06-22 DIAGNOSIS — E0590 Thyrotoxicosis, unspecified without thyrotoxic crisis or storm: Secondary | ICD-10-CM

## 2019-06-22 DIAGNOSIS — Z794 Long term (current) use of insulin: Secondary | ICD-10-CM

## 2019-06-22 DIAGNOSIS — E119 Type 2 diabetes mellitus without complications: Secondary | ICD-10-CM

## 2019-06-22 DIAGNOSIS — E11621 Type 2 diabetes mellitus with foot ulcer: Secondary | ICD-10-CM

## 2019-06-22 DIAGNOSIS — Z1211 Encounter for screening for malignant neoplasm of colon: Secondary | ICD-10-CM

## 2019-06-22 DIAGNOSIS — Z23 Encounter for immunization: Secondary | ICD-10-CM

## 2019-06-22 DIAGNOSIS — E08621 Diabetes mellitus due to underlying condition with foot ulcer: Secondary | ICD-10-CM

## 2019-06-22 LAB — POCT CBG (FASTING - GLUCOSE)-MANUAL ENTRY: Glucose Fasting, POC: 335 mg/dL — AB (ref 70–99)

## 2019-06-22 MED ORDER — HYDROCHLOROTHIAZIDE 12.5 MG PO TABS: 12.5000 mg | ORAL_TABLET | Freq: Every day | ORAL | 1 refills | Status: DC

## 2019-06-22 MED ORDER — INSULIN ASPART 100 UNIT/ML ~~LOC~~ SOLN: 15.0000 [IU] | Freq: Three times a day (TID) | SUBCUTANEOUS | 3 refills | Status: DC

## 2019-06-22 MED ORDER — GABAPENTIN 600 MG PO TABS: ORAL_TABLET | ORAL | 1 refills | Status: DC

## 2019-06-22 MED ORDER — LANTUS SOLOSTAR 100 UNIT/ML ~~LOC~~ SOPN: 34.0000 [IU] | PEN_INJECTOR | Freq: Two times a day (BID) | SUBCUTANEOUS | 1 refills | Status: DC

## 2019-06-22 MED ORDER — METHIMAZOLE 5 MG PO TABS: 5.0000 mg | ORAL_TABLET | ORAL | 3 refills | Status: DC

## 2019-06-22 MED ORDER — SITAGLIPTIN PHOSPHATE 100 MG PO TABS: 100.0000 mg | ORAL_TABLET | Freq: Every day | ORAL | 1 refills | Status: DC

## 2019-06-22 MED ORDER — METFORMIN HCL ER 500 MG PO TB24: 1000.0000 mg | ORAL_TABLET | Freq: Two times a day (BID) | ORAL | 1 refills | Status: DC

## 2019-06-22 MED ORDER — PEN NEEDLES 31G X 8 MM MISC: 1.0000 | Freq: Every day | 5 refills | Status: DC

## 2019-06-22 MED ORDER — ATORVASTATIN CALCIUM 20 MG PO TABS: 20.0000 mg | ORAL_TABLET | Freq: Every day | ORAL | 1 refills | Status: DC

## 2019-06-22 MED ORDER — LOSARTAN POTASSIUM 50 MG PO TABS: 50.0000 mg | ORAL_TABLET | Freq: Every day | ORAL | 1 refills | Status: DC

## 2019-06-22 MED ORDER — NITROGLYCERIN 0.4 MG SL SUBL: 0.4000 mg | SUBLINGUAL_TABLET | SUBLINGUAL | 3 refills | Status: DC | PRN

## 2019-06-22 MED FILL — methIMAzole 5 MG TABS: 5 | 30 days supply | Qty: 12 | Fill #0

## 2019-06-22 MED FILL — NITROGLYCERIN 0.4 MG TAB SL: 0.4 | 25 days supply | Qty: 25 | Fill #0

## 2019-06-22 MED FILL — TRUEPLUS PEN NDL 31GX5/16: 31G X 8 MM | 25 days supply | Qty: 100 | Fill #0

## 2019-06-22 MED FILL — HYDROCHLOROTHIAZIDE 12.5 MG: 12.5 | 30 days supply | Qty: 30 | Fill #0

## 2019-06-22 MED FILL — !NOVOLOG FLEXPEN SYRINGE 1: 100/ML | 30 days supply | Qty: 9 | Fill #0

## 2019-06-22 MED FILL — METFORMIN HCL ER 500 MG TB2: 500 | 30 days supply | Qty: 120 | Fill #0

## 2019-06-22 NOTE — Progress Notes (Signed)
Pt complains of calluses on her feet Pt is fasting, and has not taken any medication this morning

## 2019-06-22 NOTE — Progress Notes (Signed)
Established Patient Office Visit  Subjective:  Patient ID: Rebecca [name], female    DOB: 24-Nov-1965  Age: 53 y.o. MRN: [id number]  CC:  Chief Complaint  Patient presents with  . Follow-up    DM    HPI Rebecca [name] presents for management of uncontrol diabetes and she has a diabetic ulcer under her right foot also a great toe amputation. She is fasting and has not taken any medication elevated glucose and blood pressure. Instructed after labs to eat and take medication patient voiced understanding.  Past Medical History:  Diagnosis Date  . Anemia   . Diabetes mellitus   . Dyslipidemia   . Hypertension   . Non-STEMI (non-ST elevated myocardial infarction) [facility])    Medical therapy for distal LAD dissection  . Thyroid disease     Past Surgical History:  Procedure Laterality Date  . AMPUTATION Right 07/21/2017   Procedure: RIGHT GREAT TOE AMPUTATION;  Surgeon: Newt Minion, MD;  Location: [place] [facility];  Service: Orthopedics;  Laterality: Right;  . CARDIAC CATHETERIZATION N/A 05/14/2015   Procedure: Left Heart Cath and Coronary Angiography;  Surgeon: Leonie Man, MD; dLAD 60%>>95% (small vessel, ?dissection), CFX & RCA systems no sig dz, EF & LVEDP nl       Family History  Problem Relation Age of Onset  . Hypertension Father   . Hyperlipidemia Father   . Cancer Mother   . Diabetes Sister   . Diabetes Brother   . Diabetes Sister   . Diabetes Sister   . Diabetes Sister   . Diabetes Brother   . Diabetes Brother   . Diabetes Brother   . Diabetes Brother     Social History   Socioeconomic History  . Marital status: Single    Spouse name: Not on file  . Number of children: Not on file  . Years of education: Not on file  . Highest education level: Not on file  Occupational History  . Occupation: unemployed  Social Needs  . Financial resource strain: Not on file  . Food insecurity    Worry: Not on file    Inability: Not on file  . Transportation needs   Medical: Not on file    Non-medical: Not on file  Tobacco Use  . Smoking status: Never Smoker  . Smokeless tobacco: Never Used  Substance and Sexual Activity  . Alcohol use: No  . Drug use: No  . Sexual activity: Yes  Lifestyle  . Physical activity    Days per week: Not on file    Minutes per session: Not on file  . Stress: Not on file  Relationships  . Social Herbalist on phone: Not on file    Gets together: Not on file    Attends religious service: Not on file    Active member of club or organization: Not on file    Attends meetings of clubs or organizations: Not on file    Relationship status: Not on file  . Intimate partner violence    Fear of current or ex partner: Not on file    Emotionally abused: Not on file    Physically abused: Not on file    Forced sexual activity: Not on file  Other Topics Concern  . Not on file  Social History Narrative  . Not on file    Outpatient Medications Prior to Visit  Medication Sig Dispense Refill  . aspirin 81 MG EC tablet Take 1 tablet (  81 mg total) by mouth daily. 90 tablet 1  . polyethylene glycol (MIRALAX / GLYCOLAX) packet Take 17 g by mouth daily. 14 each 0  . atorvastatin (LIPITOR) 20 MG tablet Take 1 tablet (20 mg total) by mouth at bedtime. 30 tablet 3  . gabapentin (NEURONTIN) 600 MG tablet TAKE 2 TABLETS BY MOUTH 3 TIMES DAILY. 180 tablet 5  . insulin aspart (NOVOLOG) 100 UNIT/ML injection Inject 15 Units into the skin 3 (three) times daily before meals. 15 mL 0  . Insulin Pen Needle (PEN NEEDLES) 31G X 8 MM MISC Inject 1 each into the skin at bedtime. 30 each 5  . losartan (COZAAR) 50 MG tablet Take 1 tablet (50 mg total) by mouth daily. 30 tablet 3  . metFORMIN (GLUCOPHAGE XR) 500 MG 24 hr tablet Take 2 tablets (1,000 mg total) by mouth 2 (two) times daily after a meal. 120 tablet 3  . methimazole (TAPAZOLE) 5 MG tablet Take 1 tablet (5 mg total) by mouth every Monday, Wednesday, and Friday. 90 tablet 3  .  nitroGLYCERIN (NITROSTAT) 0.4 MG SL tablet Place 1 tablet (0.4 mg total) under the tongue every 5 (five) minutes as needed for chest pain. 25 tablet 3  . sitaGLIPtin (JANUVIA) 100 MG tablet Take 1 tablet (100 mg total) by mouth daily. 30 tablet 3  . hydrochlorothiazide (HYDRODIURIL) 12.5 MG tablet Take 1 tablet (12.5 mg total) by mouth daily. 30 tablet 3  . Insulin Glargine (LANTUS SOLOSTAR) 100 UNIT/ML Solostar Pen Inject 34 Units into the skin 2 (two) times daily. 60 pen 3   No facility-administered medications prior to visit.     No Known Allergies  ROS Review of Systems  Gastrointestinal: Positive for constipation.  Genitourinary: Positive for vaginal discharge.  Musculoskeletal:       Left flank pain intermittent    Skin:       Diabetic ulcer       Objective:    Physical Exam  Constitutional: She is oriented to person, place, and time. She appears well-developed and well-nourished.  HENT:  Head: Normocephalic.  Neck: Normal range of motion. Neck supple.  Cardiovascular: Normal rate and regular rhythm.  Abdominal: Soft. Bowel sounds are normal.  Musculoskeletal: Normal range of motion.  Neurological: She is oriented to person, place, and time.  Skin: Skin is warm and dry.  Psychiatric: She has a normal mood and affect.    BP 140/78 (BP Location: Right Arm, Patient Position: Sitting, Cuff Size: Normal)   Pulse 89   Temp (!) 97.2 F (36.2 C) (Temporal)   Ht _0  (1.575 m)   Wt 181 lb 3.2 oz (82.2 kg)   LMP  (LMP Unknown)   SpO2 99%   BMI 33.14 kg/m  Wt Readings from Last 3 Encounters:  06/22/19 181 lb 3.2 oz (82.2 kg)  03/22/19 176 lb 12.8 oz (80.2 kg)  02/08/19 180 lb (81.6 kg)     Health Maintenance Due  Topic Date Due  . OPHTHALMOLOGY EXAM  12/24/1975  . Fecal DNA (Cologuard)  12/24/2015  . PAP SMEAR-Modifier  08/06/2018  . INFLUENZA VACCINE  04/22/2019    There are no preventive care reminders to display for this patient.  Lab Results  Component  Value Date   TSH 5.512 (H) 11/27/2018   Lab Results  Component Value Date   WBC 9.4 02/08/2019   HGB 12.3 02/08/2019   HCT 36.1 02/08/2019   MCV 81.1 02/08/2019   PLT 308 02/08/2019   Lab  Results  Component Value Date   NA 130 (L) 02/08/2019   K 4.4 02/08/2019   CO2 24 02/08/2019   GLUCOSE 481 (H) 02/08/2019   BUN 46 (H) 02/08/2019   CREATININE 1.93 (H) 02/08/2019   BILITOT 0.4 12/13/2018   ALKPHOS 167 (H) 12/13/2018   AST 24 12/13/2018   ALT 31 12/13/2018   PROT 7.5 12/13/2018   ALBUMIN 4.5 12/13/2018   CALCIUM 9.6 02/08/2019   ANIONGAP 13 02/08/2019   Lab Results  Component Value Date   CHOL 173 12/13/2018   Lab Results  Component Value Date   HDL 39 (L) 12/13/2018   Lab Results  Component Value Date   LDLCALC 99 12/13/2018   Lab Results  Component Value Date   TRIG 173 (H) 12/13/2018   Lab Results  Component Value Date   CHOLHDL 4.4 12/13/2018   Lab Results  Component Value Date   HGBA1C 14.5 (A) 03/22/2019      Assessment & Plan:  Rebecca [name] was seen today for follow-up.  Diagnoses and all orders for this visit:  Special screening for malignant neoplasms, colon Normal colon cancer screening.  CDC recommends colorectal screening from ages 4-75. Screening can begin at 68 or earlier in some cases. This screening is used for a disease when no symptoms are present . Diagnostic test is used for symptoms examples blood in stool, colorectal polyps or coloector cancer, family history or inflammatory bowel disease - chron's or ulcerative colitis .(USPSTF) -     Fecal occult blood, imunochemical; Future  Uncontrolled type 2 diabetes mellitus with hyperglycemia (HCC) -     Glucose (CBG), Fasting -     gabapentin (NEURONTIN) 600 MG tablet; TAKE 2 TABLETS BY MOUTH 3 TIMES DAILY. -     Insulin Pen Needle (PEN NEEDLES) 31G X 8 MM MISC; Inject 1 each into the skin at bedtime. -     Insulin Glargine (LANTUS SOLOSTAR) 100 UNIT/ML Solostar Pen; Inject 34 Units into  the skin 2 (two) times daily.  Hyperthyroidism Overactive thyroid occurs when your thyroid gland produces too much of the hormone thyroxine. Hyperthyroidism can accelerate your body's metabolism, causing unintentional weight loss and a rapid or irregular heartbeat -     TSH + free T4 -     methimazole (TAPAZOLE) 5 MG tablet; Take 1 tablet (5 mg total) by mouth every Monday, Wednesday, and Fri       Essential hypertension Counseled on blood pressure not at  goal of less than 130/80, Today 140/78 , low-sodium, DASH diet, medication compliance, 150 minutes of moderate intensity exercise per week. Discussed medication compliance, adverse effects. -     CMP14+EGFR -     CBC with Differential  Medication refill -     gabapentin (NEURONTIN) 600 MG tablet; TAKE 2 TABLETS BY MOUTH 3 TIMES DAILY. -     nitroGLYCERIN (NITROSTAT) 0.4 MG SL tablet; Place 1 tablet (0.4 mg total) under the tongue every 5 (five) minutes as needed for chest pain. -     Insulin Pen Needle (PEN NEEDLES) 31G X 8 MM MISC; Inject 1 each into the skin at bedtime. -     metFORMIN (GLUCOPHAGE XR) 500 MG 24 hr tablet; Take 2 tablets (1,000 mg total) by mouth 2 (two) times daily after a meal. -     sitaGLIPtin (JANUVIA) 100 MG tablet; Take 1 tablet (100 mg total) by mouth daily. -     atorvastatin (LIPITOR) 20 MG tablet; Take 1 tablet (20 mg total)  by mouth at bedtime. -     Insulin Glargine (LANTUS SOLOSTAR) 100 UNIT/ML Solostar Pen; Inject 34 Units into the skin 2 (two) times daily. -     losartan (COZAAR) 50 MG tablet; Take 1 tablet (50 mg total) by mouth daily.  Type 2 diabetes mellitus without complication, with long-term current use of insulin (HCC) ADA recommends the following therapeutic goals for glycemic control related to A1c measurements: Goal of therapy: Less than 6.5 hemoglobin A1c.  Reference clinical practice recommendations. Foods that are high in carbohydrates are the following rice, potatoes, breads, sugars, and  pastas.  Reduction in the intake (eating) will assist in lowering your blood sugars. -     metFORMIN (GLUCOPHAGE XR) 500 MG 24 hr tablet; Take 2 tablets (1,000 mg total) by mouth 2 (two) times daily after a meal. -     sitaGLIPtin (JANUVIA) 100 MG tablet; Take 1 tablet (100 mg total) by mouth daily.  Elevated lipoprotein(a) Encourage to lower your cholesterol you can decrease your fatty foods, red meat, cheese, milk and increase fiber like whole grains and veggies. You can also add a fiber supplement like Metamucil or Benefiber.  -     atorvastatin (LIPITOR) 20 MG tablet; Take 1 tablet (20 mg total) by mouth at bedtime.    Hypertension, unspecified type Counseled on blood pressure goal of less than 130/80, low-sodium, DASH diet, medication compliance, 150 minutes of moderate intensity exercise per week. Discussed medication compliance, adverse effects. -     losartan (COZAAR) 50 MG tablet; Take 1 tablet (50 mg total) by mouth daily.  Onychomycosis of multiple toenails with type 2 diabetes mellitus ([place]) Onychomycosis is a fungal infection of the nail can cause disfigurement of the nail, pain and may increase risk for soft tissue bacteria infection.  Dermatophytes, non-dermatophyte molds, and candidemia albicans are causes of Onychomycosis. Refer to podiatry   Diabetic ulcer of right midfoot associated with diabetes mellitus due to underlying condition,  s Other orders -     hydrochlorothiazide (HYDRODIURIL) 12.5 MG tablet; Take 1 tablet (12.5 mg total) by mouth daily. -     insulin aspart (NOVOLOG) 100 UNIT/ML injection; Inject 15 Units into the skin 3 (three) times daily before meals.   Meds ordered this encounter  Medications  . gabapentin (NEURONTIN) 600 MG tablet    Sig: TAKE 2 TABLETS BY MOUTH 3 TIMES DAILY.    Dispense:  180 tablet    Refill:  1  . nitroGLYCERIN (NITROSTAT) 0.4 MG SL tablet    Sig: Place 1 tablet (0.4 mg total) under the tongue every 5 (five) minutes as needed  for chest pain.    Dispense:  25 tablet    Refill:  3  . Insulin Pen Needle (PEN NEEDLES) 31G X 8 MM MISC    Sig: Inject 1 each into the skin at bedtime.    Dispense:  30 each    Refill:  5  . metFORMIN (GLUCOPHAGE XR) 500 MG 24 hr tablet    Sig: Take 2 tablets (1,000 mg total) by mouth 2 (two) times daily after a meal.    Dispense:  120 tablet    Refill:  1  . sitaGLIPtin (JANUVIA) 100 MG tablet    Sig: Take 1 tablet (100 mg total) by mouth daily.    Dispense:  90 tablet    Refill:  1  . hydrochlorothiazide (HYDRODIURIL) 12.5 MG tablet    Sig: Take 1 tablet (12.5 mg total) by mouth daily.  Dispense:  90 tablet    Refill:  1  . insulin aspart (NOVOLOG) 100 UNIT/ML injection    Sig: Inject 15 Units into the skin 3 (three) times daily before meals.    Dispense:  15 mL    Refill:  3  . atorvastatin (LIPITOR) 20 MG tablet    Sig: Take 1 tablet (20 mg total) by mouth at bedtime.    Dispense:  90 tablet    Refill:  1  . Insulin Glargine (LANTUS SOLOSTAR) 100 UNIT/ML Solostar Pen    Sig: Inject 34 Units into the skin 2 (two) times daily.    Dispense:  60 pen    Refill:  1  . methimazole (TAPAZOLE) 5 MG tablet    Sig: Take 1 tablet (5 mg total) by mouth every Monday, Wednesday, and Friday.    Dispense:  90 tablet    Refill:  3  . losartan (COZAAR) 50 MG tablet    Sig: Take 1 tablet (50 mg total) by mouth daily.    Dispense:  90 tablet    Refill:  1    Follow-up: Return in about 3 months (around 09/22/2019) for T2D, Hyperthyroidism, hyperlipidemia Needs to schedule a pap also.    Kerin Perna, NP

## 2019-06-23 LAB — CMP14+EGFR
ALT: 45 IU/L — ABNORMAL HIGH (ref 0–32)
AST: 39 IU/L (ref 0–40)
Albumin/Globulin Ratio: 1.5 (ref 1.2–2.2)
Albumin: 4.5 g/dL (ref 3.8–4.9)
Alkaline Phosphatase: 212 IU/L — ABNORMAL HIGH (ref 39–117)
BUN/Creatinine Ratio: 30 — ABNORMAL HIGH (ref 9–23)
BUN: 44 mg/dL — ABNORMAL HIGH (ref 6–24)
Bilirubin Total: 0.3 mg/dL (ref 0.0–1.2)
CO2: 25 mmol/L (ref 20–29)
Calcium: 10 mg/dL (ref 8.7–10.2)
Chloride: 99 mmol/L (ref 96–106)
Creatinine, Ser: 1.48 mg/dL — ABNORMAL HIGH (ref 0.57–1.00)
GFR calc Af Amer: 46 mL/min/{1.73_m2} — ABNORMAL LOW (ref >59)
GFR calc non Af Amer: 40 mL/min/{1.73_m2} — ABNORMAL LOW (ref >59)
Globulin, Total: 3 g/dL (ref 1.5–4.5)
Glucose: 321 mg/dL — ABNORMAL HIGH (ref 65–99)
Potassium: 5.1 mmol/L (ref 3.5–5.2)
Sodium: 137 mmol/L (ref 134–144)
Total Protein: 7.5 g/dL (ref 6.0–8.5)

## 2019-06-23 LAB — CBC WITH DIFFERENTIAL/PLATELET
Basophils Absolute: 0.1 10*3/uL (ref 0.0–0.2)
Basos: 1 %
EOS (ABSOLUTE): 0.2 10*3/uL (ref 0.0–0.4)
Eos: 2 %
Hematocrit: 38.2 % (ref 34.0–46.6)
Hemoglobin: 12.3 g/dL (ref 11.1–15.9)
Immature Grans (Abs): 0 10*3/uL (ref 0.0–0.1)
Immature Granulocytes: 0 %
Lymphocytes Absolute: 2.1 10*3/uL (ref 0.7–3.1)
Lymphs: 24 %
MCH: 27.3 pg (ref 26.6–33.0)
MCHC: 32.2 g/dL (ref 31.5–35.7)
MCV: 85 fL (ref 79–97)
Monocytes Absolute: 0.6 10*3/uL (ref 0.1–0.9)
Monocytes: 7 %
Neutrophils Absolute: 5.8 10*3/uL (ref 1.4–7.0)
Neutrophils: 66 %
Platelets: 300 10*3/uL (ref 150–450)
RBC: 4.51 x10E6/uL (ref 3.77–5.28)
RDW: 13.4 % (ref 11.7–15.4)
WBC: 8.9 10*3/uL (ref 3.4–10.8)

## 2019-06-23 LAB — LIPID PANEL
Chol/HDL Ratio: 4.9 ratio — ABNORMAL HIGH (ref 0.0–4.4)
Cholesterol, Total: 192 mg/dL (ref 100–199)
HDL: 39 mg/dL — ABNORMAL LOW (ref >39)
LDL Chol Calc (NIH): 112 mg/dL — ABNORMAL HIGH (ref 0–99)
Triglycerides: 237 mg/dL — ABNORMAL HIGH (ref 0–149)
VLDL Cholesterol Cal: 41 mg/dL — ABNORMAL HIGH (ref 5–40)

## 2019-06-23 LAB — TSH+FREE T4
Free T4: 1.14 ng/dL (ref 0.82–1.77)
TSH: 1.83 u[IU]/mL (ref 0.450–4.500)

## 2019-06-27 ENCOUNTER — Encounter: Payer: Self-pay | Admitting: Orthopedic Surgery

## 2019-06-27 ENCOUNTER — Ambulatory Visit (INDEPENDENT_AMBULATORY_CARE_PROVIDER_SITE_OTHER): Payer: Self-pay | Admitting: Orthopedic Surgery

## 2019-06-27 ENCOUNTER — Ambulatory Visit: Payer: Self-pay

## 2019-06-27 ENCOUNTER — Other Ambulatory Visit: Payer: Self-pay

## 2019-06-27 VITALS — Ht 62.0 in | Wt 181.2 lb

## 2019-06-27 DIAGNOSIS — M79672 Pain in left foot: Secondary | ICD-10-CM

## 2019-06-27 DIAGNOSIS — M86171 Other acute osteomyelitis, right ankle and foot: Secondary | ICD-10-CM

## 2019-06-27 MED ORDER — MUPIROCIN 2 % EX OINT: 1.0000 "application " | TOPICAL_OINTMENT | Freq: Every day | CUTANEOUS | 6 refills | Status: DC

## 2019-06-27 MED ORDER — DOXYCYCLINE HYCLATE 100 MG PO TABS: 100.0000 mg | ORAL_TABLET | Freq: Two times a day (BID) | ORAL | 0 refills | Status: DC

## 2019-06-27 NOTE — Progress Notes (Signed)
Office Visit Note   Patient: Rebecca [name]           Date of Birth: 11/16/1965           MRN: [id number] Visit Date: 06/27/2019              Requested by: Grayce SessionsEdwards, Michelle P, NP [address] [place],  [place] [address] PCP: Grayce SessionsEdwards, Michelle P, NP  Chief Complaint  Patient presents with  . Right Foot - Follow-up, Pain  . Left Foot - Pain, Follow-up      HPI: The patient is a 53 year old woman who presents today with a new ulceration to her right foot this is been ongoing for about 2 months.  She is status post great toe amputation on the right and has an ulcer beneath the third metatarsal head she does have some clawing of the second and third toes.  She also complains of some pain to the ball of her foot on the left.  She is in some flexible dress shoes today has pain to both balls of her feet with ambulation.  Has callus buildup as well denies any drainage no fever no chills.  Assessment & Plan: Visit Diagnoses:  1. Acute osteomyelitis of toe of right foot (HCC)   2. Left foot pain     Plan: We will provide a prescription for doxycycline and mupirocin.  The donut cut out and placed in the postop shoe provided for her right foot.  Discussed that she would benefit from orthotics as well as some heel cord stretching to offload her forefoot.  Follow-Up Instructions: Return in about 2 weeks (around 07/11/2019).   Ortho Exam  Patient is alert, oriented, no adenopathy, well-dressed, normal affect, normal respiratory effort. On examination bilateral lower extremity she does have some heel cord tightness with dorsiflexion to neutral.  There is no erythema or edema to her left foot does have some mild tenderness beneath the metatarsal heads there is no callus buildup on the left.  To the right she has some callus buildup and ulceration to the third metatarsal head this is 2 cm in diameter this was debrided with a 10 blade knife back to viable tissue there was some serosanguineous  drainage there is maceration.  There is no erythema surrounding no cellulitis no purulence this does not probe to bone or tendon.  Imaging: No results found. No images are attached to the encounter.  Labs: Lab Results  Component Value Date   HGBA1C 14.5 (A) 03/22/2019   HGBA1C 13.7 (H) 11/28/2018   HGBA1C 10.8 (H) 06/20/2018   ESRSEDRATE 54 (H) 07/18/2017   CRP <0.8 07/18/2017   REPTSTATUS 06/27/2018 FINAL 06/22/2018   REPTSTATUS 06/27/2018 FINAL 06/22/2018   CULT  06/22/2018    NO GROWTH 5 DAYS Performed at [facility], [address] [place], [place] [address]    CULT  06/22/2018    NO GROWTH 5 DAYS Performed at [facility], [address] [place], [place] [address]    LABORGA ESCHERICHIA COLI (A) 06/20/2018     Lab Results  Component Value Date   ALBUMIN 4.5 06/22/2019   ALBUMIN 4.5 12/13/2018   ALBUMIN 3.1 (L) 11/28/2018   PREALBUMIN 26.0 07/19/2017    Lab Results  Component Value Date   MG 2.0 11/28/2018   MG 1.8 06/23/2018   MG 2.3 05/13/2015   Lab Results  Component Value Date   VD25OH 28 (L) 01/06/2010   VD25OH 23 (L) 07/02/2009  Lab Results  Component Value Date   PREALBUMIN 26.0 07/19/2017   CBC EXTENDED Latest Ref Rng & Units 06/22/2019 02/08/2019 11/28/2018  WBC 3.4 - 10.8 x10E3/uL 8.9 9.4 7.3  RBC 3.77 - 5.28 x10E6/uL 4.51 4.45 4.09  HGB 11.1 - 15.9 g/dL 29.4 76.5 11.3(L)  HCT 34.0 - 46.6 % 38.2 36.1 34.1(L)  PLT 150 - 450 x10E3/uL 300 308 278  NEUTROABS 1.4 - 7.0 x10E3/uL 5.8 - 3.9  LYMPHSABS 0.7 - 3.1 x10E3/uL 2.1 - 2.5     Body mass index is 33.14 kg/m.  Orders:  Orders Placed This Encounter  Procedures  . XR Foot 2 Views Right  . XR Foot 2 Views Left   Meds ordered this encounter  Medications  . doxycycline (VIBRA-TABS) 100 MG tablet    Sig: Take 1 tablet (100 mg total) by mouth 2 (two) times daily.    Dispense:  28 tablet    Refill:  0  . mupirocin ointment (BACTROBAN) 2 %    Sig: Apply 1 application  topically daily.    Dispense:  22 g    Refill:  6     Procedures: No procedures performed  Clinical Data: No additional findings.  ROS:  All other systems negative, except as noted in the HPI. Review of Systems  Objective: Vital Signs: Ht 5\' 2"  (1.575 m)   Wt 181 lb 3.2 oz (82.2 kg)   LMP  (LMP Unknown)   BMI 33.14 kg/m   Specialty Comments:  No specialty comments available.  PMFS History: Patient Active Problem List   Diagnosis Date Noted  . Diarrhea   . Hyperthyroidism   . Insulin dependent diabetes mellitus   . Orthostasis   . Vaginal discharge   . Infectious diarrhea 11/26/2018  . Hyperkalemia 11/26/2018  . Bacteremia 06/21/2018  . Sepsis secondary to UTI (HCC) 06/20/2018  . Great toe amputation status, right 08/05/2017  . Mild renal insufficiency 07/19/2017  . AKI (acute kidney injury) (HCC) 03/04/2016  . Essential hypertension 05/14/2015  . CAD- with distal LAD disease, ? 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 05/14/2015  . NSTEMI (non-ST elevated myocardial infarction) (HCC)   . Syncope 05/13/2015  . Hyperglycemia 10/25/2014  . Abrasion of thigh, left 10/25/2014  . DM (diabetes mellitus), type 2, uncontrolled (HCC)   . DM hyperosmolar coma, type 2 (HCC) 05/22/2012  . Hyperosmolar syndrome 05/21/2012   Past Medical History:  Diagnosis Date  . Anemia   . Diabetes mellitus   . Dyslipidemia   . Hypertension   . Non-STEMI (non-ST elevated myocardial infarction) [facility])    Medical therapy for distal LAD dissection  . Thyroid disease     Family History  Problem Relation Age of Onset  . Hypertension Father   . Hyperlipidemia Father   . Cancer Mother   . Diabetes Sister   . Diabetes Brother   . Diabetes Sister   . Diabetes Sister   . Diabetes Sister   . Diabetes Brother   . Diabetes Brother   . Diabetes Brother   . Diabetes Brother     Past Surgical History:  Procedure Laterality Date  . AMPUTATION Right 07/21/2017   Procedure: RIGHT GREAT TOE AMPUTATION;   Surgeon: 07/23/2017, MD;  Location: [facility] OR;  Service: Orthopedics;  Laterality: Right;  . CARDIAC CATHETERIZATION N/A 05/14/2015   Procedure: Left Heart Cath and Coronary Angiography;  Surgeon: 05/16/2015, MD; dLAD 60%>>95% (small vessel, ?dissection), CFX & RCA systems no sig dz, EF & LVEDP nl  Social History   Occupational History  . Occupation: unemployed  Tobacco Use  . Smoking status: Never Smoker  . Smokeless tobacco: Never Used  Substance and Sexual Activity  . Alcohol use: No  . Drug use: No  . Sexual activity: Yes

## 2019-06-28 MED FILL — methIMAzole 5 MG TABS: 5 | 30 days supply | Qty: 12 | Fill #2

## 2019-07-04 MED FILL — GABAPENTIN 600 MG TABLET: 600 | 30 days supply | Qty: 180 | Fill #4

## 2019-07-12 ENCOUNTER — Encounter: Payer: Self-pay | Admitting: Family

## 2019-07-12 ENCOUNTER — Ambulatory Visit (INDEPENDENT_AMBULATORY_CARE_PROVIDER_SITE_OTHER): Payer: Self-pay | Admitting: Family

## 2019-07-12 ENCOUNTER — Other Ambulatory Visit: Payer: Self-pay

## 2019-07-12 VITALS — Ht 62.0 in | Wt 181.2 lb

## 2019-07-12 DIAGNOSIS — L97511 Non-pressure chronic ulcer of other part of right foot limited to breakdown of skin: Secondary | ICD-10-CM

## 2019-07-12 NOTE — Progress Notes (Signed)
Office Visit Note   Patient: Rebecca [name]           Date of Birth: 02-08-1966           MRN: [id number] Visit Date: 07/12/2019              Requested by: Kerin Perna, NP [address] [place],  [place] [address] PCP: Kerin Perna, NP  Chief Complaint  Patient presents with  . Left Foot - Follow-up  . Right Foot - Follow-up      HPI: The patient is a 53 year old woman who presents today in follow-up for ulcer to the plantar aspect of her right foot.  She is status post great toe amputation and had a ulcer beneath the third metatarsal head for several months.  She has been in a postop shoe with a felt pressure relieving doughnut doing daily dressing changes. Recently received custom orthotics.    Assessment & Plan: Visit Diagnoses:  No diagnosis found.  Plan: discontinue post op shoe. Begin wearing orthotics. Work on heel cord stretching.   Follow-Up Instructions: No follow-ups on file.   Ortho Exam  Patient is alert, oriented, no adenopathy, well-dressed, normal affect, normal respiratory effort. On examination bilateral lower extremity she does have some heel cord tightness with dorsiflexion to neutral.  To the right she has some callus buildup. this was debrided with a 10 blade knife back to viable tissue there ulcer is well healed. No open area. There is no erythema surrounding no cellulitis  Imaging: No results found. No images are attached to the encounter.  Labs: Lab Results  Component Value Date   HGBA1C 14.5 (A) 03/22/2019   HGBA1C 13.7 (H) 11/28/2018   HGBA1C 10.8 (H) 06/20/2018   ESRSEDRATE 54 (H) 07/18/2017   CRP <0.8 07/18/2017   REPTSTATUS 06/27/2018 FINAL 06/22/2018   REPTSTATUS 06/27/2018 FINAL 06/22/2018   CULT  06/22/2018    NO GROWTH 5 DAYS Performed at [facility], [place] [address] [place], [place] [address]    CULT  06/22/2018    NO GROWTH 5 DAYS Performed at [facility] [address], [place], [place]  [address]    LABORGA ESCHERICHIA COLI (A) 06/20/2018     Lab Results  Component Value Date   ALBUMIN 4.5 06/22/2019   ALBUMIN 4.5 12/13/2018   ALBUMIN 3.1 (L) 11/28/2018   PREALBUMIN 26.0 07/19/2017    Lab Results  Component Value Date   MG 2.0 11/28/2018   MG 1.8 06/23/2018   MG 2.3 05/13/2015   Lab Results  Component Value Date   VD25OH 28 (L) 01/06/2010   VD25OH 23 (L) 07/02/2009    Lab Results  Component Value Date   PREALBUMIN 26.0 07/19/2017   CBC EXTENDED Latest Ref Rng & Units 06/22/2019 02/08/2019 11/28/2018  WBC 3.4 - 10.8 x10E3/uL 8.9 9.4 7.3  RBC 3.77 - 5.28 x10E6/uL 4.51 4.45 4.09  HGB 11.1 - 15.9 g/dL 12.3 12.3 11.3(L)  HCT 34.0 - 46.6 % 38.2 36.1 34.1(L)  PLT 150 - 450 x10E3/uL 300 308 278  NEUTROABS 1.4 - 7.0 x10E3/uL 5.8 - 3.9  LYMPHSABS 0.7 - 3.1 x10E3/uL 2.1 - 2.5     Body mass index is 33.14 kg/m.  Orders:  No orders of the defined types were placed in this encounter.  No orders of the defined types were placed in this encounter.    Procedures: No procedures performed  Clinical Data: No additional findings.  ROS:  All other  systems negative, except as noted in the HPI. Review of Systems  Constitutional: Negative for chills and fever.  Cardiovascular: Negative for leg swelling.  Skin: Negative for color change and wound.    Objective: Vital Signs: Ht 5\' 2"  (1.575 m)   Wt 181 lb 3.2 oz (82.2 kg)   LMP  (LMP Unknown)   BMI 33.14 kg/m   Specialty Comments:  No specialty comments available.  PMFS History: Patient Active Problem List   Diagnosis Date Noted  . Diarrhea   . Hyperthyroidism   . Insulin dependent diabetes mellitus   . Orthostasis   . Vaginal discharge   . Infectious diarrhea 11/26/2018  . Hyperkalemia 11/26/2018  . Bacteremia 06/21/2018  . Sepsis secondary to UTI (HCC) 06/20/2018  . Great toe amputation status, right 08/05/2017  . Mild renal insufficiency 07/19/2017  . AKI (acute kidney injury) (HCC)  03/04/2016  . Essential hypertension 05/14/2015  . CAD- with distal LAD disease, ? 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 05/14/2015  . NSTEMI (non-ST elevated myocardial infarction) (HCC)   . Syncope 05/13/2015  . Hyperglycemia 10/25/2014  . Abrasion of thigh, left 10/25/2014  . DM (diabetes mellitus), type 2, uncontrolled (HCC)   . DM hyperosmolar coma, type 2 (HCC) 05/22/2012  . Hyperosmolar syndrome 05/21/2012   Past Medical History:  Diagnosis Date  . Anemia   . Diabetes mellitus   . Dyslipidemia   . Hypertension   . Non-STEMI (non-ST elevated myocardial infarction) [facility])    Medical therapy for distal LAD dissection  . Thyroid disease     Family History  Problem Relation Age of Onset  . Hypertension Father   . Hyperlipidemia Father   . Cancer Mother   . Diabetes Sister   . Diabetes Brother   . Diabetes Sister   . Diabetes Sister   . Diabetes Sister   . Diabetes Brother   . Diabetes Brother   . Diabetes Brother   . Diabetes Brother     Past Surgical History:  Procedure Laterality Date  . AMPUTATION Right 07/21/2017   Procedure: RIGHT GREAT TOE AMPUTATION;  Surgeon: 07/23/2017, MD;  Location: [facility] OR;  Service: Orthopedics;  Laterality: Right;  . CARDIAC CATHETERIZATION N/A 05/14/2015   Procedure: Left Heart Cath and Coronary Angiography;  Surgeon: 05/16/2015, MD; dLAD 60%>>95% (small vessel, ?dissection), CFX & RCA systems no sig dz, EF & LVEDP nl      Social History   Occupational History  . Occupation: unemployed  Tobacco Use  . Smoking status: Never Smoker  . Smokeless tobacco: Never Used  Substance and Sexual Activity  . Alcohol use: No  . Drug use: No  . Sexual activity: Yes

## 2019-07-19 MED FILL — !LANTUS SOLOSTAR 100UNITS/M: 100 | 17 days supply | Qty: 12 | Fill #4

## 2019-07-21 ENCOUNTER — Encounter (INDEPENDENT_AMBULATORY_CARE_PROVIDER_SITE_OTHER): Payer: Self-pay | Admitting: Primary Care

## 2019-07-21 ENCOUNTER — Other Ambulatory Visit: Payer: Self-pay

## 2019-07-21 ENCOUNTER — Emergency Department (HOSPITAL_COMMUNITY)
Admission: EM | Admit: 2019-07-21 | Discharge: 2019-07-21 | Disposition: A | Discharge status: Home / Self Care | Payer: Self-pay | Attending: Emergency Medicine | Admitting: Emergency Medicine

## 2019-07-21 ENCOUNTER — Ambulatory Visit (INDEPENDENT_AMBULATORY_CARE_PROVIDER_SITE_OTHER): Payer: Self-pay | Admitting: Primary Care

## 2019-07-21 VITALS — BP 138/89 | HR 98 | Temp 97.3°F | Ht 62.0 in | Wt 178.4 lb

## 2019-07-21 DIAGNOSIS — E039 Hypothyroidism, unspecified: Secondary | ICD-10-CM | POA: Insufficient documentation

## 2019-07-21 DIAGNOSIS — R202 Paresthesia of skin: Secondary | ICD-10-CM

## 2019-07-21 DIAGNOSIS — Z79899 Other long term (current) drug therapy: Secondary | ICD-10-CM | POA: Insufficient documentation

## 2019-07-21 DIAGNOSIS — R739 Hyperglycemia, unspecified: Secondary | ICD-10-CM

## 2019-07-21 DIAGNOSIS — E1165 Type 2 diabetes mellitus with hyperglycemia: Secondary | ICD-10-CM | POA: Insufficient documentation

## 2019-07-21 DIAGNOSIS — Z794 Long term (current) use of insulin: Secondary | ICD-10-CM | POA: Insufficient documentation

## 2019-07-21 DIAGNOSIS — Z7982 Long term (current) use of aspirin: Secondary | ICD-10-CM | POA: Insufficient documentation

## 2019-07-21 DIAGNOSIS — I10 Essential (primary) hypertension: Secondary | ICD-10-CM | POA: Insufficient documentation

## 2019-07-21 LAB — URINALYSIS, ROUTINE W REFLEX MICROSCOPIC
Bacteria, UA: NONE SEEN
Bilirubin Urine: NEGATIVE
Glucose, UA: >=500 mg/dL — AB
Ketones, ur: NEGATIVE mg/dL
Nitrite: NEGATIVE
Protein, ur: 30 mg/dL — AB
Specific Gravity, Urine: 1.018 (ref 1.005–1.030)
pH: 7 (ref 5.0–8.0)

## 2019-07-21 LAB — HEPATIC FUNCTION PANEL
ALT: 47 U/L — ABNORMAL HIGH (ref 0–44)
AST: 34 U/L (ref 15–41)
Albumin: 3.8 g/dL (ref 3.5–5.0)
Alkaline Phosphatase: 174 U/L — ABNORMAL HIGH (ref 38–126)
Bilirubin, Direct: <0.1 mg/dL (ref 0.0–0.2)
Total Bilirubin: 0.6 mg/dL (ref 0.3–1.2)
Total Protein: 7.5 g/dL (ref 6.5–8.1)

## 2019-07-21 LAB — CBC WITH DIFFERENTIAL/PLATELET
Abs Immature Granulocytes: 0.03 10*3/uL (ref 0.00–0.07)
Basophils Absolute: 0.1 10*3/uL (ref 0.0–0.1)
Basophils Relative: 1 %
Eosinophils Absolute: 0.1 10*3/uL (ref 0.0–0.5)
Eosinophils Relative: 1 %
HCT: 36.5 % (ref 36.0–46.0)
Hemoglobin: 12.1 g/dL (ref 12.0–15.0)
Immature Granulocytes: 0 %
Lymphocytes Relative: 30 %
Lymphs Abs: 2.3 10*3/uL (ref 0.7–4.0)
MCH: 27.5 pg (ref 26.0–34.0)
MCHC: 33.2 g/dL (ref 30.0–36.0)
MCV: 83 fL (ref 80.0–100.0)
Monocytes Absolute: 0.6 10*3/uL (ref 0.1–1.0)
Monocytes Relative: 8 %
Neutro Abs: 4.5 10*3/uL (ref 1.7–7.7)
Neutrophils Relative %: 60 %
Platelets: 239 10*3/uL (ref 150–400)
RBC: 4.4 MIL/uL (ref 3.87–5.11)
RDW: 12.1 % (ref 11.5–15.5)
WBC: 7.6 10*3/uL (ref 4.0–10.5)
nRBC: 0 % (ref 0.0–0.2)

## 2019-07-21 LAB — BASIC METABOLIC PANEL
Anion gap: 13 (ref 5–15)
BUN: 29 mg/dL — ABNORMAL HIGH (ref 6–20)
CO2: 26 mmol/L (ref 22–32)
Calcium: 9.4 mg/dL (ref 8.9–10.3)
Chloride: 98 mmol/L (ref 98–111)
Creatinine, Ser: 1.16 mg/dL — ABNORMAL HIGH (ref 0.44–1.00)
GFR calc Af Amer: >60 mL/min (ref >60)
GFR calc non Af Amer: 54 mL/min — ABNORMAL LOW (ref >60)
Glucose, Bld: 281 mg/dL — ABNORMAL HIGH (ref 70–99)
Potassium: 4.3 mmol/L (ref 3.5–5.1)
Sodium: 137 mmol/L (ref 135–145)

## 2019-07-21 LAB — POCT GLYCOSYLATED HEMOGLOBIN (HGB A1C)

## 2019-07-21 LAB — GLUCOSE, POCT (MANUAL RESULT ENTRY): POC Glucose: 497 mg/dl — AB (ref 70–99)

## 2019-07-21 LAB — POCT URINALYSIS DIP (CLINITEK)
Bilirubin, UA: NEGATIVE
Glucose, UA: >=1000 mg/dL — AB
Ketones, POC UA: NEGATIVE mg/dL
Leukocytes, UA: NEGATIVE
Nitrite, UA: NEGATIVE
POC PROTEIN,UA: NEGATIVE
Spec Grav, UA: 1.01 (ref 1.010–1.025)
Urobilinogen, UA: 0.2 E.U./dL
pH, UA: 6 (ref 5.0–8.0)

## 2019-07-21 LAB — CBG MONITORING, ED: Glucose-Capillary: 280 mg/dL — ABNORMAL HIGH (ref 70–99)

## 2019-07-21 LAB — LIPASE, BLOOD: Lipase: 57 U/L — ABNORMAL HIGH (ref 11–51)

## 2019-07-21 MED ORDER — SODIUM CHLORIDE 0.9 % IV BOLUS
1000.0000 mL | Freq: Once | INTRAVENOUS | Status: AC
  Administered 2019-07-21: 1000 mL via INTRAVENOUS

## 2019-07-21 NOTE — Progress Notes (Signed)
Established Patient Office Visit  Subjective:  Patient ID: Rebecca [name], female    DOB: 02/05/1966  Age: 53 y.o. MRN: [id number]  CC:  Chief Complaint  Patient presents with  . Gynecologic Exam  . Diabetes    HPI Rebecca [name] presents for a gynecological visit however her glucose is elevated and A1C is too high too register. Patient states she is taking her medication as prescribed insulin long acting in AM 34 units and 2 pm she takes 15 units and evening takes 34. Previous orders 15 units with meals . She also take a big white and a brown one. She checks her CBG once a day.   Past Medical History:  Diagnosis Date  . Anemia   . Diabetes mellitus   . Dyslipidemia   . Hypertension   . Non-STEMI (non-ST elevated myocardial infarction) [facility](HCC)    Medical therapy for distal LAD dissection  . Thyroid disease     Past Surgical History:  Procedure Laterality Date  . AMPUTATION Right 07/21/2017   Procedure: RIGHT GREAT TOE AMPUTATION;  Surgeon: Nadara Mustarduda, Marcus V, MD;  [facility];  Service: Orthopedics;  Laterality: Right;  . CARDIAC CATHETERIZATION N/A 05/14/2015   Procedure: Left Heart Cath and Coronary Angiography;  Surgeon: Marykay Lexavid W Harding, MD; dLAD 60%>>95% (small vessel, ?dissection), CFX & RCA systems no sig dz, EF & LVEDP nl       Family History  Problem Relation Age of Onset  . Hypertension Father   . Hyperlipidemia Father   . Cancer Mother   . Diabetes Sister   . Diabetes Brother   . Diabetes Sister   . Diabetes Sister   . Diabetes Sister   . Diabetes Brother   . Diabetes Brother   . Diabetes Brother   . Diabetes Brother     Social History   Socioeconomic History  . Marital status: Single    Spouse name: Not on file  . Number of children: Not on file  . Years of education: Not on file  . Highest education level: Not on file  Occupational History  . Occupation: unemployed  Social Needs  . Financial resource strain: Not on file  . Food insecurity     Worry: Not on file    Inability: Not on file  . Transportation needs    Medical: Not on file    Non-medical: Not on file  Tobacco Use  . Smoking status: Never Smoker  . Smokeless tobacco: Never Used  Substance and Sexual Activity  . Alcohol use: No  . Drug use: No  . Sexual activity: Yes  Lifestyle  . Physical activity    Days per week: Not on file    Minutes per session: Not on file  . Stress: Not on file  Relationships  . Social Musicianconnections    Talks on phone: Not on file    Gets together: Not on file    Attends religious service: Not on file    Active member of club or organization: Not on file    Attends meetings of clubs or organizations: Not on file    Relationship status: Not on file  . Intimate partner violence    Fear of current or ex partner: Not on file    Emotionally abused: Not on file    Physically abused: Not on file    Forced sexual activity: Not on file  Other Topics Concern  . Not on file  Social History Narrative  . Not  on file    Outpatient Medications Prior to Visit  Medication Sig Dispense Refill  . aspirin 81 MG EC tablet Take 1 tablet (81 mg total) by mouth daily. 90 tablet 1  . atorvastatin (LIPITOR) 20 MG tablet Take 1 tablet (20 mg total) by mouth at bedtime. 90 tablet 1  . doxycycline (VIBRA-TABS) 100 MG tablet Take 1 tablet (100 mg total) by mouth 2 (two) times daily. 28 tablet 0  . gabapentin (NEURONTIN) 600 MG tablet TAKE 2 TABLETS BY MOUTH 3 TIMES DAILY. 180 tablet 1  . hydrochlorothiazide (HYDRODIURIL) 12.5 MG tablet Take 1 tablet (12.5 mg total) by mouth daily. 90 tablet 1  . insulin aspart (NOVOLOG) 100 UNIT/ML injection Inject 15 Units into the skin 3 (three) times daily before meals. 15 mL 3  . Insulin Glargine (LANTUS SOLOSTAR) 100 UNIT/ML Solostar Pen Inject 34 Units into the skin 2 (two) times daily. 60 pen 1  . Insulin Pen Needle (PEN NEEDLES) 31G X 8 MM MISC Inject 1 each into the skin at bedtime. 30 each 5  . losartan (COZAAR)  50 MG tablet Take 1 tablet (50 mg total) by mouth daily. 90 tablet 1  . metFORMIN (GLUCOPHAGE XR) 500 MG 24 hr tablet Take 2 tablets (1,000 mg total) by mouth 2 (two) times daily after a meal. 120 tablet 1  . methimazole (TAPAZOLE) 5 MG tablet Take 1 tablet (5 mg total) by mouth every Monday, Wednesday, and Friday. 90 tablet 3  . mupirocin ointment (BACTROBAN) 2 % Apply 1 application topically daily. 22 g 6  . nitroGLYCERIN (NITROSTAT) 0.4 MG SL tablet Place 1 tablet (0.4 mg total) under the tongue every 5 (five) minutes as needed for chest pain. 25 tablet 3  . polyethylene glycol (MIRALAX / GLYCOLAX) packet Take 17 g by mouth daily. 14 each 0  . sitaGLIPtin (JANUVIA) 100 MG tablet Take 1 tablet (100 mg total) by mouth daily. 90 tablet 1   No facility-administered medications prior to visit.     No Known Allergies  ROS Review of Systems  Endocrine: Positive for polydipsia, polyphagia and polyuria.  Neurological: Positive for numbness.  All other systems reviewed and are negative.     Objective:    Physical Exam  Constitutional: She is oriented to person, place, and time. She appears well-developed and well-nourished.  HENT:  Head: Normocephalic.  Neck: Neck supple.  Cardiovascular: Normal rate and regular rhythm.  Abdominal: Soft. Bowel sounds are normal. She exhibits distension.  Neurological: She is oriented to person, place, and time.  Psychiatric: She has a normal mood and affect.    BP 138/89 (BP Location: Right Arm, Patient Position: Sitting, Cuff Size: Normal)   Pulse 98   Temp (!) 97.3 F (36.3 C) (Temporal)   Ht 5\' 2"  (1.575 m)   Wt 178 lb 6.4 oz (80.9 kg)   LMP  (LMP Unknown)   SpO2 99%   BMI 32.63 kg/m  Wt Readings from Last 3 Encounters:  07/21/19 178 lb 6.4 oz (80.9 kg)  07/12/19 181 lb 3.2 oz (82.2 kg)  06/27/19 181 lb 3.2 oz (82.2 kg)     Health Maintenance Due  Topic Date Due  . OPHTHALMOLOGY EXAM  12/24/1975  . Fecal DNA (Cologuard)  12/24/2015   . PAP SMEAR-Modifier  08/06/2018    There are no preventive care reminders to display for this patient.  Lab Results  Component Value Date   TSH 1.830 06/22/2019   Lab Results  Component Value Date  WBC 8.9 06/22/2019   HGB 12.3 06/22/2019   HCT 38.2 06/22/2019   MCV 85 06/22/2019   PLT 300 06/22/2019   Lab Results  Component Value Date   NA 137 06/22/2019   K 5.1 06/22/2019   CO2 25 06/22/2019   GLUCOSE 321 (H) 06/22/2019   BUN 44 (H) 06/22/2019   CREATININE 1.48 (H) 06/22/2019   BILITOT 0.3 06/22/2019   ALKPHOS 212 (H) 06/22/2019   AST 39 06/22/2019   ALT 45 (H) 06/22/2019   PROT 7.5 06/22/2019   ALBUMIN 4.5 06/22/2019   CALCIUM 10.0 06/22/2019   ANIONGAP 13 02/08/2019   Lab Results  Component Value Date   CHOL 192 06/22/2019   Lab Results  Component Value Date   HDL 39 (L) 06/22/2019   Lab Results  Component Value Date   LDLCALC 112 (H) 06/22/2019   Lab Results  Component Value Date   TRIG 237 (H) 06/22/2019   Lab Results  Component Value Date   CHOLHDL 4.9 (H) 06/22/2019   Lab Results  Component Value Date   HGBA1C  07/21/2019     Comment:     code 106- a1c too high to read       Assessment & Plan:   Nguyet was seen today for gynecologic exam and diabetes.  Diagnoses and all orders for this visit:  Uncontrolled type 2 diabetes mellitus with hyperglycemia ([place]) Referred to emergency room patient agreed to go by own transportation. In clinical setting unable to treat.  After intense discussion patient was taking her medication as she understood but not correctly. Discussed in detail take Lantus 34 units AM/PM after meals  15 units of regular  After meals Hold if CBG is 110 or less and checking CBG 3 times a day . She was only checking CBG once.  -     HgB A1c unable to register with POCT -     Glucose (CBG) fasting 497  Hyperglycemia -     POCT URINALYSIS DIP (CLINITEK)    No orders of the defined types were placed in this  encounter.   Follow-up: No follow-ups on file.    Kerin Perna, NP

## 2019-07-21 NOTE — ED Triage Notes (Signed)
Pt to ED c/o, wallee spanish interpretor used for triage. Pt reports she went to primary care provider today for pap smear and MD was concerned that pt had blood in her urine and blood sugar was high. Pt denies pain with urination, and report she has not noticed any blood in urine. Pt does c/o itching in her vaginal area.

## 2019-07-21 NOTE — ED Notes (Signed)
Pt stated she does not have to pee at this time 

## 2019-07-21 NOTE — ED Notes (Signed)
Wallee spanish interpretor used to review discharge summary with pt. Pt verbalizes understanding. No s/s of acute distress noted.

## 2019-07-21 NOTE — ED Provider Notes (Signed)
[facility] Provider Note   CSN: [id number] Arrival date & time: 07/21/19  1041     History   Chief Complaint Chief Complaint  Patient presents with  . Hyperglycemia    HPI Lenor Derricksidora I Flores is a 53 y.o. female.     The history is provided by the patient.  Hyperglycemia Blood sugar level PTA:  500 Severity:  Mild Onset quality:  Gradual Timing:  Constant Progression:  Unchanged Chronicity:  New Diabetes status:  Controlled with insulin Context: noncompliance   Relieved by:  Nothing Ineffective treatments:  None tried Associated symptoms: increased thirst   Associated symptoms: no abdominal pain, no chest pain, no dysuria, no fever, no shortness of breath and no vomiting   Risk factors: no hx of DKA     Past Medical History:  Diagnosis Date  . Anemia   . Diabetes mellitus   . Dyslipidemia   . Hypertension   . Non-STEMI (non-ST elevated myocardial infarction) [facility](HCC)    Medical therapy for distal LAD dissection  . Thyroid disease     Patient Active Problem List   Diagnosis Date Noted  . Diarrhea   . Hyperthyroidism   . Insulin dependent diabetes mellitus   . Orthostasis   . Vaginal discharge   . Infectious diarrhea 11/26/2018  . Hyperkalemia 11/26/2018  . Bacteremia 06/21/2018  . Sepsis secondary to UTI (HCC) 06/20/2018  . Great toe amputation status, right 08/05/2017  . Mild renal insufficiency 07/19/2017  . AKI (acute kidney injury) (HCC) 03/04/2016  . Essential hypertension 05/14/2015  . CAD- with distal LAD disease, ? 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 05/14/2015  . NSTEMI (non-ST elevated myocardial infarction) (HCC)   . Syncope 05/13/2015  . Hyperglycemia 10/25/2014  . Abrasion of thigh, left 10/25/2014  . DM (diabetes mellitus), type 2, uncontrolled (HCC)   . DM hyperosmolar coma, type 2 (HCC) 05/22/2012  . Hyperosmolar syndrome 05/21/2012    Past Surgical History:  Procedure Laterality Date  . AMPUTATION Right  07/21/2017   Procedure: RIGHT GREAT TOE AMPUTATION;  Surgeon: Nadara Mustarduda, Marcus V, MD;  [facility];  Service: Orthopedics;  Laterality: Right;  . CARDIAC CATHETERIZATION N/A 05/14/2015   Procedure: Left Heart Cath and Coronary Angiography;  Surgeon: Marykay Lexavid W Harding, MD; dLAD 60%>>95% (small vessel, ?dissection), CFX & RCA systems no sig dz, EF & LVEDP nl        OB History    Gravida  4   Para      Term      Preterm      AB      Living  4     SAB      TAB      Ectopic      Multiple      Live Births  4            Home Medications    Prior to Admission medications   Medication Sig Start Date End Date Taking? Authorizing Provider  aspirin 81 MG EC tablet Take 1 tablet (81 mg total) by mouth daily. 08/09/18   Loletta SpecterGomez, Roger David, PA-C  atorvastatin (LIPITOR) 20 MG tablet Take 1 tablet (20 mg total) by mouth at bedtime. 06/22/19   Grayce SessionsEdwards, Michelle P, NP  doxycycline (VIBRA-TABS) 100 MG tablet Take 1 tablet (100 mg total) by mouth 2 (two) times daily. 06/27/19   Adonis HugueninZamora, Erin R, NP  gabapentin (NEURONTIN) 600 MG tablet TAKE 2 TABLETS BY MOUTH 3 TIMES DAILY. 06/22/19   Gwinda PasseEdwards, Michelle  P, NP  hydrochlorothiazide (HYDRODIURIL) 12.5 MG tablet Take 1 tablet (12.5 mg total) by mouth daily. 06/22/19 07/22/19  Grayce Sessions, NP  insulin aspart (NOVOLOG) 100 UNIT/ML injection Inject 15 Units into the skin 3 (three) times daily before meals. 06/22/19   Grayce Sessions, NP  Insulin Glargine (LANTUS SOLOSTAR) 100 UNIT/ML Solostar Pen Inject 34 Units into the skin 2 (two) times daily. 06/22/19 07/22/19  Grayce Sessions, NP  Insulin Pen Needle (PEN NEEDLES) 31G X 8 MM MISC Inject 1 each into the skin at bedtime. 06/22/19   Grayce Sessions, NP  losartan (COZAAR) 50 MG tablet Take 1 tablet (50 mg total) by mouth daily. 06/22/19   Grayce Sessions, NP  metFORMIN (GLUCOPHAGE XR) 500 MG 24 hr tablet Take 2 tablets (1,000 mg total) by mouth 2 (two) times daily after a meal. 06/22/19    Grayce Sessions, NP  methimazole (TAPAZOLE) 5 MG tablet Take 1 tablet (5 mg total) by mouth every Monday, Wednesday, and Friday. 06/23/19   Grayce Sessions, NP  mupirocin ointment (BACTROBAN) 2 % Apply 1 application topically daily. 06/27/19   Adonis Huguenin, NP  nitroGLYCERIN (NITROSTAT) 0.4 MG SL tablet Place 1 tablet (0.4 mg total) under the tongue every 5 (five) minutes as needed for chest pain. 06/22/19   Grayce Sessions, NP  polyethylene glycol (MIRALAX / GLYCOLAX) packet Take 17 g by mouth daily. 11/28/18   Albertine Grates, MD  sitaGLIPtin (JANUVIA) 100 MG tablet Take 1 tablet (100 mg total) by mouth daily. 06/22/19   Grayce Sessions, NP    Family History Family History  Problem Relation Age of Onset  . Hypertension Father   . Hyperlipidemia Father   . Cancer Mother   . Diabetes Sister   . Diabetes Brother   . Diabetes Sister   . Diabetes Sister   . Diabetes Sister   . Diabetes Brother   . Diabetes Brother   . Diabetes Brother   . Diabetes Brother     Social History Social History   Tobacco Use  . Smoking status: Never Smoker  . Smokeless tobacco: Never Used  Substance Use Topics  . Alcohol use: No  . Drug use: No     Allergies   Patient has no known allergies.   Review of Systems Review of Systems  Constitutional: Negative for chills and fever.  HENT: Negative for ear pain and sore throat.   Eyes: Negative for pain and visual disturbance.  Respiratory: Negative for cough and shortness of breath.   Cardiovascular: Negative for chest pain and palpitations.  Gastrointestinal: Negative for abdominal pain and vomiting.  Endocrine: Positive for polydipsia.  Genitourinary: Negative for dysuria and hematuria.  Musculoskeletal: Negative for arthralgias and back pain.  Skin: Negative for color change and rash.  Neurological: Negative for seizures and syncope.  All other systems reviewed and are negative.    Physical Exam Updated Vital Signs BP (!) 149/73    Pulse 81   Temp 98.7 F (37.1 C) (Oral)   Resp 16   Ht  (1.6 m)   Wt 77.1 kg   LMP  (LMP Unknown)   SpO2 100%   BMI 30.11 kg/m   Physical Exam Vitals signs and nursing note reviewed.  Constitutional:      General: She is not in acute distress.    Appearance: She is well-developed.  HENT:     Head: Normocephalic and atraumatic.     Mouth/Throat:  Mouth: Mucous membranes are moist.  Eyes:     Extraocular Movements: Extraocular movements intact.     Conjunctiva/sclera: Conjunctivae normal.     Pupils: Pupils are equal, round, and reactive to light.  Neck:     Musculoskeletal: Normal range of motion and neck supple.  Cardiovascular:     Rate and Rhythm: Normal rate and regular rhythm.     Pulses: Normal pulses.     Heart sounds: Normal heart sounds. No murmur.  Pulmonary:     Effort: Pulmonary effort is normal. No respiratory distress.     Breath sounds: Normal breath sounds.  Abdominal:     General: Abdomen is flat.     Palpations: Abdomen is soft.     Tenderness: There is no abdominal tenderness.  Skin:    General: Skin is warm and dry.     Capillary Refill: Capillary refill takes less than 2 seconds.  Neurological:     General: No focal deficit present.     Mental Status: She is alert.  Psychiatric:        Mood and Affect: Mood normal.      ED Treatments / Results  Labs (all labs ordered are listed, but only abnormal results are displayed) Labs Reviewed  BASIC METABOLIC PANEL - Abnormal; Notable for the following components:      Result Value   Glucose, Bld 281 (*)    BUN 29 (*)    Creatinine, Ser 1.16 (*)    GFR calc non Af Amer 54 (*)    All other components within normal limits  HEPATIC FUNCTION PANEL - Abnormal; Notable for the following components:   ALT 47 (*)    Alkaline Phosphatase 174 (*)    All other components within normal limits  LIPASE, BLOOD - Abnormal; Notable for the following components:   Lipase 57 (*)    All other  components within normal limits  URINALYSIS, ROUTINE W REFLEX MICROSCOPIC - Abnormal; Notable for the following components:   Color, Urine STRAW (*)    Glucose, UA >=500 (*)    Hgb urine dipstick MODERATE (*)    Protein, ur 30 (*)    Leukocytes,Ua SMALL (*)    All other components within normal limits  CBG MONITORING, ED - Abnormal; Notable for the following components:   Glucose-Capillary 280 (*)    All other components within normal limits  CBC WITH DIFFERENTIAL/PLATELET    EKG None  Radiology No results found.  Procedures Procedures (including critical care time)  Medications Ordered in ED Medications  sodium chloride 0.9 % bolus 1,000 mL (0 mLs Intravenous Stopped 07/21/19 1250)     Initial Impression / Assessment and Plan / ED Course  I have reviewed the triage vital signs and the nursing notes.  Pertinent labs & imaging results that were available during my care of the patient were reviewed by me and considered in my medical decision making (see chart for details).     ASHA GRUMBINE is a 53 year old female here for hyperglycemia.  History of diabetes.  Was seen at her primary care doctor's office today and noticed that she had a blood sugar of 500.  Had not taken her morning insulin.  Upon arrival here blood sugar is 280.  Vital signs unremarkable.  There might have been some microscopic blood in her urine but no gross hematuria.  Does not have any abdominal pain.  No history of kidney stones.  No urinary symptoms overall.  Overall she is well-appearing.  Lab work showed no significant anemia, electrolyte abnormality, kidney injury.  Urinalysis negative for infection.  Was given 1 L of IV fluids.  Recommend that she continue home insulin regimen which she appears to have not been compliant with.  I think that there was confusion about her medication regimen which hopefully has been clarified now with her primary care doctor.  No concern for DKA.  Discharged in good  condition.  Recommend follow-up with primary care doctor.  This chart was dictated using voice recognition software.  Despite best efforts to proofread,  errors can occur which can change the documentation meaning.    Final Clinical Impressions(s) / ED Diagnoses   Final diagnoses:  Hyperglycemia    ED Discharge Orders    None       Virgina Norfolk, DO 07/21/19 1333

## 2019-07-22 LAB — HEMOGLOBIN A1C
Est. average glucose Bld gHb Est-mCnc: 375 mg/dL
Hgb A1c MFr Bld: 14.7 % — ABNORMAL HIGH (ref 4.8–5.6)

## 2019-07-25 MED FILL — ATORVASTATIN CALCIUM 20 MG: 20 | 30 days supply | Qty: 30 | Fill #3

## 2019-07-25 MED FILL — !NOVOLOG FLEXPEN SYRINGE 1: 100/ML | 20 days supply | Qty: 6 | Fill #1

## 2019-07-25 MED FILL — LOSARTAN POTASSIUM 50 MG TA: 50 | 30 days supply | Qty: 30 | Fill #2

## 2019-08-03 MED FILL — HYDROCHLOROTHIAZIDE 12.5 MG: 12.5 | 30 days supply | Qty: 30 | Fill #1

## 2019-08-04 MED FILL — GABAPENTIN 600 MG TABLET: 600 | 30 days supply | Qty: 180 | Fill #5

## 2019-08-10 MED FILL — NOVOLOG FLEXPEN SYRINGE: 100 | 13 days supply | Qty: 6 | Fill #2

## 2019-08-11 ENCOUNTER — Other Ambulatory Visit: Payer: Self-pay

## 2019-08-11 DIAGNOSIS — Z20822 Contact with and (suspected) exposure to covid-19: Secondary | ICD-10-CM

## 2019-08-14 LAB — NOVEL CORONAVIRUS, NAA: SARS-CoV-2, NAA: DETECTED — AB

## 2019-08-14 MED FILL — !LANTUS SOLOSTAR 100UNITS/M: 100 | 17 days supply | Qty: 12 | Fill #5

## 2019-08-16 ENCOUNTER — Ambulatory Visit (INDEPENDENT_AMBULATORY_CARE_PROVIDER_SITE_OTHER): Payer: Self-pay | Admitting: Primary Care

## 2019-08-29 MED FILL — LOSARTAN POTASSIUM 50 MG TA: 50 | 30 days supply | Qty: 30 | Fill #3

## 2019-08-29 MED FILL — methIMAzole 5 MG TABS: 5 | 30 days supply | Qty: 12 | Fill #3

## 2019-09-12 MED FILL — GABAPENTIN 600 MG TABLET: 600 | 30 days supply | Qty: 180 | Fill #0

## 2019-09-12 MED FILL — !LANTUS SOLOSTAR 100UNITS/M: 100 | 17 days supply | Qty: 12 | Fill #6

## 2019-09-12 MED FILL — NOVOLOG FLEXPEN SYRINGE: 100 | 32 days supply | Qty: 15 | Fill #3

## 2019-09-19 ENCOUNTER — Other Ambulatory Visit (INDEPENDENT_AMBULATORY_CARE_PROVIDER_SITE_OTHER): Payer: Self-pay | Admitting: Primary Care

## 2019-09-19 DIAGNOSIS — E7841 Elevated Lipoprotein(a): Secondary | ICD-10-CM

## 2019-09-19 DIAGNOSIS — Z760 Encounter for issue of repeat prescription: Secondary | ICD-10-CM

## 2019-09-19 MED FILL — HYDROCHLOROTHIAZIDE 12.5 MG: 12.5 | 30 days supply | Qty: 30 | Fill #2

## 2019-09-21 ENCOUNTER — Ambulatory Visit (INDEPENDENT_AMBULATORY_CARE_PROVIDER_SITE_OTHER): Payer: Self-pay | Admitting: Primary Care

## 2019-09-25 MED FILL — methIMAzole 5 MG TABS: 5 | 30 days supply | Qty: 12 | Fill #4

## 2019-09-25 MED FILL — !LANTUS SOLOSTAR 100UNITS/M: 100 | 17 days supply | Qty: 12 | Fill #7

## 2019-09-25 MED FILL — ATORVASTATIN CALCIUM 20 MG: 20 | 30 days supply | Qty: 30 | Fill #0

## 2019-09-25 MED FILL — LOSARTAN POTASSIUM 50 MG TA: 50 | 30 days supply | Qty: 30 | Fill #0

## 2019-09-26 ENCOUNTER — Other Ambulatory Visit: Payer: Self-pay

## 2019-09-26 ENCOUNTER — Encounter: Payer: Self-pay | Admitting: Physician Assistant

## 2019-09-26 ENCOUNTER — Ambulatory Visit (INDEPENDENT_AMBULATORY_CARE_PROVIDER_SITE_OTHER): Payer: Self-pay | Admitting: Physician Assistant

## 2019-09-26 VITALS — Ht 63.0 in | Wt 170.0 lb

## 2019-09-26 DIAGNOSIS — L97511 Non-pressure chronic ulcer of other part of right foot limited to breakdown of skin: Secondary | ICD-10-CM

## 2019-09-26 MED ORDER — DOXYCYCLINE HYCLATE 100 MG PO TABS: 100.0000 mg | ORAL_TABLET | Freq: Two times a day (BID) | ORAL | 0 refills | Status: DC

## 2019-09-26 MED FILL — DOXYCYCLINE HYCLATE 100 MG: 100 | 14 days supply | Qty: 28 | Fill #0

## 2019-09-27 ENCOUNTER — Encounter: Payer: Self-pay | Admitting: Physician Assistant

## 2019-09-27 NOTE — Progress Notes (Signed)
Office Visit Note   Patient: Rebecca [name]           Date of Birth: 06-May-1966           MRN: [id number] Visit Date: 09/26/2019              Requested by: Kerin Perna, NP [address] [place],  [place] [address] PCP: Kerin Perna, NP  Chief Complaint  Patient presents with  . Right Foot - Open Wound, Pain      HPI: This is a pleasant Spanish-speaking only woman with a history of a great toe amputation.  She is also seen in the past for calluses beneath her forefoot.  She has had this debrided in the past.  Over the last couple weeks she has noticed the callus is thickened and she has gotten a small painful blister beneath her second toe.  Assessment & Plan: Visit Diagnoses: No diagnosis found.  Plan: After obtaining verbal consent I did debride the callus to soft surfaces I also debrided the blister which had a very small amount of purulent drainage.  I will place her on a short course of antibiotics and follow-up in 2 weeks we have also provided her with a new donut for her shoe to relieve pressure  Follow-Up Instructions: No follow-ups on file.   Ortho Exam  Patient is alert, oriented, no adenopathy, well-dressed, normal affect, normal respiratory effort. Focused examination of her right forefoot demonstrates thickened callus without surrounding cellulitis.  This is beneath the second metatarsal head.  Just distal to this she has a very small blister.  When this was opened it had a very scant amount of purulent drainage and did not probe deeply there is no surrounding cellulitis or foul odor  Imaging: No results found. No images are attached to the encounter.  Labs: Lab Results  Component Value Date   HGBA1C 14.7 (H) 07/21/2019   HGBA1C  07/21/2019     Comment:     code 106- a1c too high to read    HGBA1C 14.5 (A) 03/22/2019   ESRSEDRATE 54 (H) 07/18/2017   CRP <0.8 07/18/2017   REPTSTATUS 06/27/2018 FINAL 06/22/2018   REPTSTATUS 06/27/2018 FINAL  06/22/2018   CULT  06/22/2018    NO GROWTH 5 DAYS Performed at [facility], [place] [address] [place], [place] [address]    CULT  06/22/2018    NO GROWTH 5 DAYS Performed at [facility] [address], [place], [place] [address]    LABORGA ESCHERICHIA COLI (A) 06/20/2018     Lab Results  Component Value Date   ALBUMIN 3.8 07/21/2019   ALBUMIN 4.5 06/22/2019   ALBUMIN 4.5 12/13/2018   PREALBUMIN 26.0 07/19/2017    Lab Results  Component Value Date   MG 2.0 11/28/2018   MG 1.8 06/23/2018   MG 2.3 05/13/2015   Lab Results  Component Value Date   VD25OH 28 (L) 01/06/2010   VD25OH 23 (L) 07/02/2009    Lab Results  Component Value Date   PREALBUMIN 26.0 07/19/2017   CBC EXTENDED Latest Ref Rng & Units 07/21/2019 06/22/2019 02/08/2019  WBC 4.0 - 10.5 K/uL 7.6 8.9 9.4  RBC 3.87 - 5.11 MIL/uL 4.40 4.51 4.45  HGB 12.0 - 15.0 g/dL 12.1 12.3 12.3  HCT 36.0 - 46.0 % 36.5 38.2 36.1  PLT 150 - 400 K/uL 239 300 308  NEUTROABS 1.7 - 7.7 K/uL 4.5 5.8 -  LYMPHSABS 0.7 - 4.0 K/uL  2.3 2.1 -     Body mass index is 30.11 kg/m.  Orders:  No orders of the defined types were placed in this encounter.  Meds ordered this encounter  Medications  . doxycycline (VIBRA-TABS) 100 MG tablet    Sig: Take 1 tablet (100 mg total) by mouth 2 (two) times daily.    Dispense:  28 tablet    Refill:  0     Procedures: No procedures performed  Clinical Data: No additional findings.  ROS:  All other systems negative, except as noted in the HPI. Review of Systems  Objective: Vital Signs: Ht 5\' 3"  (1.6 m)   Wt 170 lb (77.1 kg)   LMP  (LMP Unknown)   BMI 30.11 kg/m   Specialty Comments:  No specialty comments available.  PMFS History: Patient Active Problem List   Diagnosis Date Noted  . Diarrhea   . Hyperthyroidism   . Insulin dependent diabetes mellitus   . Orthostasis   . Vaginal discharge   . Infectious diarrhea 11/26/2018  . Hyperkalemia 11/26/2018  .  Bacteremia 06/21/2018  . Sepsis secondary to UTI (HCC) 06/20/2018  . Great toe amputation status, right 08/05/2017  . Mild renal insufficiency 07/19/2017  . AKI (acute kidney injury) (HCC) 03/04/2016  . Essential hypertension 05/14/2015  . CAD- with distal LAD disease, ? 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 05/14/2015  . NSTEMI (non-ST elevated myocardial infarction) (HCC)   . Syncope 05/13/2015  . Hyperglycemia 10/25/2014  . Abrasion of thigh, left 10/25/2014  . DM (diabetes mellitus), type 2, uncontrolled (HCC)   . DM hyperosmolar coma, type 2 (HCC) 05/22/2012  . Hyperosmolar syndrome 05/21/2012   Past Medical History:  Diagnosis Date  . Anemia   . Diabetes mellitus   . Dyslipidemia   . Hypertension   . Non-STEMI (non-ST elevated myocardial infarction) [facility])    Medical therapy for distal LAD dissection  . Thyroid disease     Family History  Problem Relation Age of Onset  . Hypertension Father   . Hyperlipidemia Father   . Cancer Mother   . Diabetes Sister   . Diabetes Brother   . Diabetes Sister   . Diabetes Sister   . Diabetes Sister   . Diabetes Brother   . Diabetes Brother   . Diabetes Brother   . Diabetes Brother     Past Surgical History:  Procedure Laterality Date  . AMPUTATION Right 07/21/2017   Procedure: RIGHT GREAT TOE AMPUTATION;  Surgeon: 07/23/2017, MD;  Location: [facility];  Service: Orthopedics;  Laterality: Right;  . CARDIAC CATHETERIZATION N/A 05/14/2015   Procedure: Left Heart Cath and Coronary Angiography;  Surgeon: 05/16/2015, MD; dLAD 60%>>95% (small vessel, ?dissection), CFX & RCA systems no sig dz, EF & LVEDP nl      Social History   Occupational History  . Occupation: unemployed  Tobacco Use  . Smoking status: Never Smoker  . Smokeless tobacco: Never Used  Substance and Sexual Activity  . Alcohol use: No  . Drug use: No  . Sexual activity: Yes

## 2019-10-03 ENCOUNTER — Ambulatory Visit (INDEPENDENT_AMBULATORY_CARE_PROVIDER_SITE_OTHER): Payer: Self-pay | Admitting: Primary Care

## 2019-10-03 ENCOUNTER — Other Ambulatory Visit: Payer: Self-pay

## 2019-10-03 ENCOUNTER — Other Ambulatory Visit (INDEPENDENT_AMBULATORY_CARE_PROVIDER_SITE_OTHER): Payer: Self-pay | Admitting: Primary Care

## 2019-10-03 ENCOUNTER — Encounter (INDEPENDENT_AMBULATORY_CARE_PROVIDER_SITE_OTHER): Payer: Self-pay | Admitting: Primary Care

## 2019-10-03 VITALS — BP 149/88 | HR 93 | Temp 97.2°F | Ht 63.0 in | Wt 180.0 lb

## 2019-10-03 DIAGNOSIS — E1165 Type 2 diabetes mellitus with hyperglycemia: Secondary | ICD-10-CM

## 2019-10-03 DIAGNOSIS — E1169 Type 2 diabetes mellitus with other specified complication: Secondary | ICD-10-CM

## 2019-10-03 DIAGNOSIS — E119 Type 2 diabetes mellitus without complications: Secondary | ICD-10-CM

## 2019-10-03 DIAGNOSIS — Z794 Long term (current) use of insulin: Secondary | ICD-10-CM

## 2019-10-03 DIAGNOSIS — E0590 Thyrotoxicosis, unspecified without thyrotoxic crisis or storm: Secondary | ICD-10-CM

## 2019-10-03 DIAGNOSIS — E7841 Elevated Lipoprotein(a): Secondary | ICD-10-CM

## 2019-10-03 DIAGNOSIS — E785 Hyperlipidemia, unspecified: Secondary | ICD-10-CM

## 2019-10-03 DIAGNOSIS — Z760 Encounter for issue of repeat prescription: Secondary | ICD-10-CM

## 2019-10-03 DIAGNOSIS — I10 Essential (primary) hypertension: Secondary | ICD-10-CM

## 2019-10-03 LAB — POCT GLYCOSYLATED HEMOGLOBIN (HGB A1C): Hemoglobin A1C: 11 % — AB (ref 4.0–5.6)

## 2019-10-03 LAB — POCT CBG (FASTING - GLUCOSE)-MANUAL ENTRY: Glucose Fasting, POC: 170 mg/dL — AB (ref 70–99)

## 2019-10-03 MED ORDER — ASPIRIN 81 MG PO TBEC: 81.0000 mg | DELAYED_RELEASE_TABLET | Freq: Every day | ORAL | 1 refills | Status: DC

## 2019-10-03 MED ORDER — LANTUS SOLOSTAR 100 UNIT/ML ~~LOC~~ SOPN: 34.0000 [IU] | PEN_INJECTOR | Freq: Two times a day (BID) | SUBCUTANEOUS | 1 refills | Status: DC

## 2019-10-03 MED ORDER — PEN NEEDLES 31G X 8 MM MISC: 1.0000 | Freq: Every day | 5 refills | Status: DC

## 2019-10-03 MED ORDER — METFORMIN HCL ER 500 MG PO TB24: 1000.0000 mg | ORAL_TABLET | Freq: Two times a day (BID) | ORAL | 1 refills | Status: DC

## 2019-10-03 MED ORDER — METHIMAZOLE 5 MG PO TABS: 5.0000 mg | ORAL_TABLET | ORAL | 3 refills | Status: DC

## 2019-10-03 MED ORDER — HYDROCHLOROTHIAZIDE 12.5 MG PO TABS: 12.5000 mg | ORAL_TABLET | Freq: Every day | ORAL | 1 refills | Status: DC

## 2019-10-03 MED ORDER — ATORVASTATIN CALCIUM 20 MG PO TABS: 20.0000 mg | ORAL_TABLET | Freq: Every day | ORAL | 1 refills | Status: DC

## 2019-10-03 MED ORDER — LOSARTAN POTASSIUM 100 MG PO TABS: 100.0000 mg | ORAL_TABLET | Freq: Every day | ORAL | 1 refills | Status: DC

## 2019-10-03 MED ORDER — GABAPENTIN 600 MG PO TABS: ORAL_TABLET | ORAL | 1 refills | Status: DC

## 2019-10-03 MED ORDER — SITAGLIPTIN PHOSPHATE 100 MG PO TABS: 100.0000 mg | ORAL_TABLET | Freq: Every day | ORAL | 1 refills | Status: DC

## 2019-10-03 MED ORDER — INSULIN ASPART 100 UNIT/ML ~~LOC~~ SOLN: 15.0000 [IU] | Freq: Three times a day (TID) | SUBCUTANEOUS | 3 refills | Status: DC

## 2019-10-03 MED FILL — METFORMIN HCL ER 500 MG TAB: 500 | 30 days supply | Qty: 120 | Fill #0

## 2019-10-03 MED FILL — TRUEPLUS PEN NDL 31GX5/16: 31G X 8 MM | 100 days supply | Qty: 100 | Fill #0

## 2019-10-03 NOTE — Patient Instructions (Signed)
Diabetes mellitus y nutricin, en adultos Diabetes Mellitus and Nutrition, Adult Si sufre de diabetes (diabetes mellitus), es muy importante tener hbitos alimenticios saludables debido a que sus niveles de azcar en la sangre (glucosa) se ven afectados en gran medida por lo que come y bebe. Comer alimentos saludables en las cantidades adecuadas, aproximadamente a la misma hora todos los das, lo ayudar a:  Controlar la glucemia.  Disminuir el riesgo de sufrir una enfermedad cardaca.  Mejorar la presin arterial.  Alcanzar o mantener un peso saludable. Todas las personas que sufren de diabetes son diferentes y cada una tiene necesidades diferentes en cuanto a un plan de alimentacin. El mdico puede recomendarle que trabaje con un especialista en dietas y nutricin (nutricionista) para elaborar el mejor plan para usted. Su plan de alimentacin puede variar segn factores como:  Las caloras que necesita.  Los medicamentos que toma.  Su peso.  Sus niveles de glucemia, presin arterial y colesterol.  Su nivel de actividad.  Otras afecciones que tenga, como enfermedades cardacas o renales. Cmo me afectan los carbohidratos? Los carbohidratos, o hidratos de carbono, afectan su nivel de glucemia ms que cualquier otro tipo de alimento. La ingesta de carbohidratos naturalmente aumenta la cantidad de glucosa en la sangre. El recuento de carbohidratos es un mtodo destinado a llevar un registro de la cantidad de carbohidratos que se consumen. El recuento de carbohidratos es importante para mantener la glucemia a un nivel saludable, especialmente si utiliza insulina o toma determinados medicamentos por va oral para la diabetes. Es importante conocer la cantidad de carbohidratos que se pueden ingerir en cada comida sin correr ningn riesgo. Esto es diferente en cada persona. Su nutricionista puede ayudarlo a calcular la cantidad de carbohidratos que debe ingerir en cada comida y en cada  refrigerio. Entre los alimentos que contienen carbohidratos, se incluyen:  Pan, cereal, arroz, pastas y galletas.  Papas y maz.  Guisantes, frijoles y lentejas.  Leche y yogur.  Frutas y jugo.  Postres, como pasteles, galletas, helado y caramelos. Cmo me afecta el alcohol? El alcohol puede provocar disminuciones sbitas de la glucemia (hipoglucemia), especialmente si utiliza insulina o toma determinados medicamentos por va oral para la diabetes. La hipoglucemia es una afeccin potencialmente mortal. Los sntomas de la hipoglucemia (somnolencia, mareos y confusin) son similares a los sntomas de haber consumido demasiado alcohol. Si el mdico afirma que el alcohol es seguro para usted, siga estas pautas:  Limite el consumo de alcohol a no ms de 1medida por da si es mujer y no est embarazada, y a 2medidas si es hombre. Una medida equivale a 12oz (355ml) de cerveza, 5oz (148ml) de vino o 1oz (44ml) de bebidas alcohlicas de alta graduacin.  No beba con el estmago vaco.  Mantngase hidratado bebiendo agua, refrescos dietticos o t helado sin azcar.  Tenga en cuenta que los refrescos comunes, los jugos y otras bebida para mezclar pueden contener mucha azcar y se deben contar como carbohidratos. Cules son algunos consejos para seguir este plan?  Leer las etiquetas de los alimentos  Comience por leer el tamao de la porcin en la "Informacin nutricional" en las etiquetas de los alimentos envasados y las bebidas. La cantidad de caloras, carbohidratos, grasas y otros nutrientes mencionados en la etiqueta se basan en una porcin del alimento. Muchos alimentos contienen ms de una porcin por envase.  Verifique la cantidad total de gramos (g) de carbohidratos totales en una porcin. Puede calcular la cantidad de porciones de carbohidratos al dividir el   total de carbohidratos por 15. Por ejemplo, si un alimento tiene un total de 30g de carbohidratos, equivale a 2  porciones de carbohidratos.  Verifique la cantidad de gramos (g) de grasas saturadas y grasas trans en una porcin. Escoja alimentos que no contengan grasa o que tengan un bajo contenido.  Verifique la cantidad de miligramos (mg) de sal (sodio) en una porcin. La mayora de las personas deben limitar la ingesta de sodio total a menos de 2300mg por da.  Siempre consulte la informacin nutricional de los alimentos etiquetados como "con bajo contenido de grasa" o "sin grasa". Estos alimentos pueden tener un mayor contenido de azcar agregada o carbohidratos refinados, y deben evitarse.  Hable con su nutricionista para identificar sus objetivos diarios en cuanto a los nutrientes mencionados en la etiqueta. Al ir de compras  Evite comprar alimentos procesados, enlatados o precocinados. Estos alimentos tienden a tener una mayor cantidad de grasa, sodio y azcar agregada.  Compre en la zona exterior de la tienda de comestibles. Esta zona incluye frutas y verduras frescas, granos a granel, carnes frescas y productos lcteos frescos. Al cocinar  Utilice mtodos de coccin a baja temperatura, como hornear, en lugar de mtodos de coccin a alta temperatura, como frer en abundante aceite.  Cocine con aceites saludables, como el aceite de oliva, canola o girasol.  Evite cocinar con manteca, crema o carnes con alto contenido de grasa. Planificacin de las comidas  Coma las comidas y los refrigerios regularmente, preferentemente a la misma hora todos los das. Evite pasar largos perodos de tiempo sin comer.  Consuma alimentos ricos en fibra, como frutas frescas, verduras, frijoles y cereales integrales. Consulte a su nutricionista sobre cuntas porciones de carbohidratos puede consumir en cada comida.  Consuma entre 4 y 6 onzas (oz) de protenas magras por da, como carnes magras, pollo, pescado, huevos o tofu. Una onza de protena magra equivale a: ? 1 onza de carne, pollo o  pescado. ? 1huevo. ?  taza de tofu.  Coma algunos alimentos por da que contengan grasas saludables, como aguacates, frutos secos, semillas y pescado. Estilo de vida  Controle su nivel de glucemia con regularidad.  Haga actividad fsica habitualmente como se lo haya indicado el mdico. Esto puede incluir lo siguiente: ? 150minutos semanales de ejercicio de intensidad moderada o alta. Esto podra incluir caminatas dinmicas, ciclismo o gimnasia acutica. ? Realizar ejercicios de elongacin y de fortalecimiento, como yoga o levantamiento de pesas, por lo menos 2veces por semana.  Tome los medicamentos como se lo haya indicado el mdico.  No consuma ningn producto que contenga nicotina o tabaco, como cigarrillos y cigarrillos electrnicos. Si necesita ayuda para dejar de fumar, consulte al mdico.  Trabaje con un asesor o instructor en diabetes para identificar estrategias para controlar el estrs y cualquier desafo emocional y social. Preguntas para hacerle al mdico  Es necesario que consulte a un instructor en el cuidado de la diabetes?  Es necesario que me rena con un nutricionista?  A qu nmero puedo llamar si tengo preguntas?  Cules son los mejores momentos para controlar la glucemia? Dnde encontrar ms informacin:  Asociacin Estadounidense de la Diabetes (American Diabetes Association): diabetes.org  Academia de Nutricin y Diettica (Academy of Nutrition and Dietetics): www.eatright.org  Instituto Nacional de la Diabetes y las Enfermedades Digestivas y Renales (National Institute of Diabetes and Digestive and Kidney Diseases, NIH): www.niddk.nih.gov Resumen  Un plan de alimentacin saludable lo ayudar a controlar la glucemia y mantener un estilo de vida saludable.    Trabajar con un especialista en dietas y nutricin (nutricionista) puede ayudarlo a elaborar el mejor plan de alimentacin para usted.  Tenga en cuenta que los carbohidratos (hidratos de  carbono) y el alcohol tienen efectos inmediatos en sus niveles de glucemia. Es importante contar los carbohidratos que ingiere y consumir alcohol con prudencia. Esta informacin no tiene como fin reemplazar el consejo del mdico. Asegrese de hacerle al mdico cualquier pregunta que tenga. Document Revised: 05/18/2017 Document Reviewed: 12/28/2016 Elsevier Patient Education  2020 [employer]  

## 2019-10-03 NOTE — Progress Notes (Signed)
Established Patient Office Visit  Subjective:  Patient ID: Rebecca [name], female    DOB: 1965/11/05  Age: 54 y.o. MRN: [id number]  CC:  Chief Complaint  Patient presents with  . Follow-up    hyperthyroidism/DM/hyperlipidemia    HPI Rebecca [name] presents for management of diabetes last A1C was 14.5 07/21/2019 today it is 11.0 improvement she does endorse polyphagia. Hypertension elevated today she does endorses headaches and dizziness , she does check her blood sugar at this time and BS as high as 300.  Past Medical History:  Diagnosis Date  . Anemia   . Diabetes mellitus   . Dyslipidemia   . Hypertension   . Non-STEMI (non-ST elevated myocardial infarction) [facility])    Medical therapy for distal LAD dissection  . Thyroid disease     Past Surgical History:  Procedure Laterality Date  . AMPUTATION Right 07/21/2017   Procedure: RIGHT GREAT TOE AMPUTATION;  Surgeon: Nadara Mustard, MD;  Location: [facility] OR;  Service: Orthopedics;  Laterality: Right;  . CARDIAC CATHETERIZATION N/A 05/14/2015   Procedure: Left Heart Cath and Coronary Angiography;  Surgeon: Marykay Lex, MD; dLAD 60%>>95% (small vessel, ?dissection), CFX & RCA systems no sig dz, EF & LVEDP nl       Family History  Problem Relation Age of Onset  . Hypertension Father   . Hyperlipidemia Father   . Cancer Mother   . Diabetes Sister   . Diabetes Brother   . Diabetes Sister   . Diabetes Sister   . Diabetes Sister   . Diabetes Brother   . Diabetes Brother   . Diabetes Brother   . Diabetes Brother     Social History   Socioeconomic History  . Marital status: Single    Spouse name: Not on file  . Number of children: Not on file  . Years of education: Not on file  . Highest education level: Not on file  Occupational History  . Occupation: unemployed  Tobacco Use  . Smoking status: Never Smoker  . Smokeless tobacco: Never Used  Substance and Sexual Activity  . Alcohol use: No  . Drug use: No  .  Sexual activity: Yes  Other Topics Concern  . Not on file  Social History Narrative  . Not on file   Social Determinants of Health   Financial Resource Strain:   . Difficulty of Paying Living Expenses: Not on file  Food Insecurity:   . Worried About Programme researcher, broadcasting/film/video in the Last Year: Not on file  . Ran Out of Food in the Last Year: Not on file  Transportation Needs:   . Lack of Transportation (Medical): Not on file  . Lack of Transportation (Non-Medical): Not on file  Physical Activity:   . Days of Exercise per Week: Not on file  . Minutes of Exercise per Session: Not on file  Stress:   . Feeling of Stress : Not on file  Social Connections:   . Frequency of Communication with Friends and Family: Not on file  . Frequency of Social Gatherings with Friends and Family: Not on file  . Attends Religious Services: Not on file  . Active Member of Clubs or Organizations: Not on file  . Attends Banker Meetings: Not on file  . Marital Status: Not on file  Intimate Partner Violence:   . Fear of Current or Ex-Partner: Not on file  . Emotionally Abused: Not on file  . Physically Abused: Not on  file  . Sexually Abused: Not on file    Outpatient Medications Prior to Visit  Medication Sig Dispense Refill  . doxycycline (VIBRA-TABS) 100 MG tablet Take 1 tablet (100 mg total) by mouth 2 (two) times daily. 28 tablet 0  . mupirocin ointment (BACTROBAN) 2 % Apply 1 application topically daily. 22 g 6  . nitroGLYCERIN (NITROSTAT) 0.4 MG SL tablet Place 1 tablet (0.4 mg total) under the tongue every 5 (five) minutes as needed for chest pain. 25 tablet 3  . polyethylene glycol (MIRALAX / GLYCOLAX) packet Take 17 g by mouth daily. 14 each 0  . aspirin 81 MG EC tablet Take 1 tablet (81 mg total) by mouth daily. 90 tablet 1  . atorvastatin (LIPITOR) 20 MG tablet TAKE 1 TABLET (20 MG TOTAL) BY MOUTH AT BEDTIME. 30 tablet 3  . gabapentin (NEURONTIN) 600 MG tablet TAKE 2 TABLETS BY MOUTH  3 TIMES DAILY. 180 tablet 1  . insulin aspart (NOVOLOG) 100 UNIT/ML injection Inject 15 Units into the skin 3 (three) times daily before meals. 15 mL 3  . Insulin Pen Needle (PEN NEEDLES) 31G X 8 MM MISC Inject 1 each into the skin at bedtime. 30 each 5  . losartan (COZAAR) 50 MG tablet Take 1 tablet (50 mg total) by mouth daily. 90 tablet 1  . metFORMIN (GLUCOPHAGE XR) 500 MG 24 hr tablet Take 2 tablets (1,000 mg total) by mouth 2 (two) times daily after a meal. 120 tablet 1  . methimazole (TAPAZOLE) 5 MG tablet Take 1 tablet (5 mg total) by mouth every Monday, Wednesday, and Friday. 90 tablet 3  . sitaGLIPtin (JANUVIA) 100 MG tablet Take 1 tablet (100 mg total) by mouth daily. 90 tablet 1  . hydrochlorothiazide (HYDRODIURIL) 12.5 MG tablet Take 1 tablet (12.5 mg total) by mouth daily. 90 tablet 1  . Insulin Glargine (LANTUS SOLOSTAR) 100 UNIT/ML Solostar Pen Inject 34 Units into the skin 2 (two) times daily. 60 pen 1   No facility-administered medications prior to visit.    No Known Allergies  ROS Review of Systems  Endocrine: Positive for polyphagia.  Neurological: Positive for light-headedness, numbness and headaches.  All other systems reviewed and are negative.     Objective:    Physical Exam  Constitutional: She is oriented to person, place, and time. She appears well-developed and well-nourished.  HENT:  Head: Normocephalic.  Cardiovascular: Normal rate and regular rhythm.  Pulmonary/Chest: Effort normal and breath sounds normal.  Abdominal: Soft. Bowel sounds are normal.  Musculoskeletal:     Cervical back: Normal range of motion and neck supple.  Neurological: She is oriented to person, place, and time. She has normal reflexes.  Skin: Skin is warm and dry.  Psychiatric: She has a normal mood and affect. Her behavior is normal. Judgment and thought content normal.    BP (!) 149/88 (BP Location: Right Arm, Patient Position: Sitting, Cuff Size: Normal)   Pulse 93    Temp (!) 97.2 F (36.2 C) (Temporal)   Ht 5\' 3"  (1.6 m)   Wt 180 lb (81.6 kg)   LMP  (LMP Unknown)   SpO2 99%   BMI 31.89 kg/m  Wt Readings from Last 3 Encounters:  10/03/19 180 lb (81.6 kg)  09/26/19 170 lb (77.1 kg)  07/21/19 170 lb (77.1 kg)     Health Maintenance Due  Topic Date Due  . OPHTHALMOLOGY EXAM  12/24/1975  . Fecal DNA (Cologuard)  12/24/2015  . PAP SMEAR-Modifier  08/06/2018  There are no preventive care reminders to display for this patient.  Lab Results  Component Value Date   TSH 1.830 06/22/2019   Lab Results  Component Value Date   WBC 7.6 07/21/2019   HGB 12.1 07/21/2019   HCT 36.5 07/21/2019   MCV 83.0 07/21/2019   PLT 239 07/21/2019   Lab Results  Component Value Date   NA 137 07/21/2019   K 4.3 07/21/2019   CO2 26 07/21/2019   GLUCOSE 281 (H) 07/21/2019   BUN 29 (H) 07/21/2019   CREATININE 1.16 (H) 07/21/2019   BILITOT 0.6 07/21/2019   ALKPHOS 174 (H) 07/21/2019   AST 34 07/21/2019   ALT 47 (H) 07/21/2019   PROT 7.5 07/21/2019   ALBUMIN 3.8 07/21/2019   CALCIUM 9.4 07/21/2019   ANIONGAP 13 07/21/2019   Lab Results  Component Value Date   CHOL 192 06/22/2019   Lab Results  Component Value Date   HDL 39 (L) 06/22/2019   Lab Results  Component Value Date   LDLCALC 112 (H) 06/22/2019   Lab Results  Component Value Date   TRIG 237 (H) 06/22/2019   Lab Results  Component Value Date   CHOLHDL 4.9 (H) 06/22/2019   Lab Results  Component Value Date   HGBA1C 11.0 (A) 10/03/2019      Assessment & Plan:  Cerise was seen today for follow-up.  Diagnoses and all orders for this visit:  Uncontrolled type 2 diabetes mellitus with hyperglycemia (HCC) Improvement in A1C -     HgB A1c 11.0 -     Glucose (CBG), Fasting -     gabapentin (NEURONTIN) 600 MG tablet; TAKE 2 TABLETS BY MOUTH 3 TIMES DAILY. -     Insulin Pen Needle (PEN NEEDLES) 31G X 8 MM MISC; Inject 1 each into the skin at bedtime. -     Insulin Glargine  (LANTUS SOLOSTAR) 100 UNIT/ML Solostar Pen; Inject 34 Units into the skin 2 (two) times daily.  Hyperlipidemia associated with type 2 diabetes mellitus (HCC) Elevated  Lipid panel 06/2019 continue atorvastatin 20mg  qhs follow up lipid panel. Decrease your fatty foods, red meat, cheese, milk and increase fiber like whole grains and veggies. You can also add a fiber supplement like Metamucil or Benefiber.   Hyperthyroidism Follow up will check TSH/T4 continue current dose previous labs normal -     methimazole (TAPAZOLE) 5 MG tablet; Take 1 tablet (5 mg total) by mouth every Monday, Wednesday, and Friday.  Medication refill -     gabapentin (NEURONTIN) 600 MG tablet; TAKE 2 TABLETS BY MOUTH 3 TIMES DAILY. -     Insulin Pen Needle (PEN NEEDLES) 31G X 8 MM MISC; Inject 1 each into the skin at bedtime. -     metFORMIN (GLUCOPHAGE XR) 500 MG 24 hr tablet; Take 2 tablets (1,000 mg total) by mouth 2 (two) times daily after a meal. -     sitaGLIPtin (JANUVIA) 100 MG tablet; Take 1 tablet (100 mg total) by mouth daily. -     atorvastatin (LIPITOR) 20 MG tablet; Take 1 tablet (20 mg total) by mouth at bedtime. -     Insulin Glargine (LANTUS SOLOSTAR) 100 UNIT/ML Solostar Pen; Inject 34 Units into the skin 2 (two) times daily. -     losartan (COZAAR) 100 MG tablet; Take 1 tablet (100 mg total) by mouth daily.  Type 2 diabetes mellitus without complication, with long-term current use of insulin (HCC) -     metFORMIN (GLUCOPHAGE XR) 500  MG 24 hr tablet; Take 2 tablets (1,000 mg total) by mouth 2 (two) times daily after a meal. -     sitaGLIPtin (JANUVIA) 100 MG tablet; Take 1 tablet (100 mg total) by mouth daily.  Elevated lipoprotein(a) -     atorvastatin (LIPITOR) 20 MG tablet; Take 1 tablet (20 mg total) by mouth at bedtime.  Hypertension, unspecified type Uncontrolled Cozzar increased to 100mg  and continue HCTZ 12.5 . Goal 130/80  low-sodium, DASH diet, medication compliance, 150 minutes of moderate  intensity exercise per week. Discussed medication compliance, adverse effects. -     losartan (COZAAR) 100 MG tablet; Take 1 tablet (100 mg total) by mouth daily. -     aspirin 81 MG EC tablet; Take 1 tablet (81 mg total) by mouth daily.  Other orders -     hydrochlorothiazide (HYDRODIURIL) 12.5 MG tablet; Take 1 tablet (12.5 mg total) by mouth daily. -     insulin aspart (NOVOLOG) 100 UNIT/ML injection; Inject 15 Units into the skin 3 (three) times daily before meals.    Meds ordered this encounter  Medications  . gabapentin (NEURONTIN) 600 MG tablet    Sig: TAKE 2 TABLETS BY MOUTH 3 TIMES DAILY.    Dispense:  180 tablet    Refill:  1  . Insulin Pen Needle (PEN NEEDLES) 31G X 8 MM MISC    Sig: Inject 1 each into the skin at bedtime.    Dispense:  30 each    Refill:  5  . metFORMIN (GLUCOPHAGE XR) 500 MG 24 hr tablet    Sig: Take 2 tablets (1,000 mg total) by mouth 2 (two) times daily after a meal.    Dispense:  120 tablet    Refill:  1  . sitaGLIPtin (JANUVIA) 100 MG tablet    Sig: Take 1 tablet (100 mg total) by mouth daily.    Dispense:  90 tablet    Refill:  1  . hydrochlorothiazide (HYDRODIURIL) 12.5 MG tablet    Sig: Take 1 tablet (12.5 mg total) by mouth daily.    Dispense:  90 tablet    Refill:  1  . insulin aspart (NOVOLOG) 100 UNIT/ML injection    Sig: Inject 15 Units into the skin 3 (three) times daily before meals.    Dispense:  15 mL    Refill:  3  . atorvastatin (LIPITOR) 20 MG tablet    Sig: Take 1 tablet (20 mg total) by mouth at bedtime.    Dispense:  90 tablet    Refill:  1  . Insulin Glargine (LANTUS SOLOSTAR) 100 UNIT/ML Solostar Pen    Sig: Inject 34 Units into the skin 2 (two) times daily.    Dispense:  60 pen    Refill:  1  . methimazole (TAPAZOLE) 5 MG tablet    Sig: Take 1 tablet (5 mg total) by mouth every Monday, Wednesday, and Friday.    Dispense:  90 tablet    Refill:  3  . losartan (COZAAR) 100 MG tablet    Sig: Take 1 tablet (100 mg  total) by mouth daily.    Dispense:  90 tablet    Refill:  1  . aspirin 81 MG EC tablet    Sig: Take 1 tablet (81 mg total) by mouth daily.    Dispense:  90 tablet    Refill:  1    Follow-up: Return in about 6 weeks (around 11/14/2019) for Blood pressure check in person 6 weeks and  3  months early appointment labs and DM/HTN.    Kerin Perna, NP

## 2019-10-10 ENCOUNTER — Ambulatory Visit (INDEPENDENT_AMBULATORY_CARE_PROVIDER_SITE_OTHER): Payer: Self-pay | Admitting: Physician Assistant

## 2019-10-10 ENCOUNTER — Encounter: Payer: Self-pay | Admitting: Physician Assistant

## 2019-10-10 ENCOUNTER — Other Ambulatory Visit: Payer: Self-pay

## 2019-10-10 VITALS — Ht 63.0 in | Wt 180.0 lb

## 2019-10-10 DIAGNOSIS — L97511 Non-pressure chronic ulcer of other part of right foot limited to breakdown of skin: Secondary | ICD-10-CM

## 2019-10-10 NOTE — Progress Notes (Signed)
Office Visit Note   Patient: Rebecca [name]           Date of Birth: 02-05-1966           MRN: [id number] Visit Date: 10/10/2019              Requested by: Kerin Perna, NP [address] [place],  [place] [address] PCP: Kerin Perna, NP  Chief Complaint  Patient presents with  . Right Foot - Follow-up      HPI: This is a pleasant woman who is status post great toe amputation.  We have been debriding the callus for her.  She is also asking for another doughnut for her shoe.  Her most recent hemoglobin A1c was 11  Assessment & Plan: Visit Diagnoses: No diagnosis found.  Plan: I discussed with her that while her hemoglobin A1c has improved from 14 to 11 it is important for her to continue to work on this.  I have fashioned another doughnut for her shoe.  She will follow up in 1 month or sooner if she has any difficulties  Follow-Up Instructions: No follow-ups on file.   Ortho Exam  Patient is alert, oriented, no adenopathy, well-dressed, normal affect, normal respiratory effort. Focused examination demonstrates thickened callus beneath her metatarsal head.  There is no surrounding erythema no drainage and its not tender to palpation in her anywhere in her foot she has a well-healed incision from her great toe amputation after obtaining verbal consent through her interpreter the callus was debrided to a soft surface.  Imaging: No results found. No images are attached to the encounter.  Labs: Lab Results  Component Value Date   HGBA1C 11.0 (A) 10/03/2019   HGBA1C 14.7 (H) 07/21/2019   HGBA1C  07/21/2019     Comment:     code 106- a1c too high to read    ESRSEDRATE 54 (H) 07/18/2017   CRP <0.8 07/18/2017   REPTSTATUS 06/27/2018 FINAL 06/22/2018   REPTSTATUS 06/27/2018 FINAL 06/22/2018   CULT  06/22/2018    NO GROWTH 5 DAYS Performed at [facility], [place] [address] [place], [place] [address]    CULT  06/22/2018    NO GROWTH 5 DAYS Performed at  [facility] [address] [place], [place] [address]    LABORGA ESCHERICHIA COLI (A) 06/20/2018     Lab Results  Component Value Date   ALBUMIN 3.8 07/21/2019   ALBUMIN 4.5 06/22/2019   ALBUMIN 4.5 12/13/2018   PREALBUMIN 26.0 07/19/2017    Lab Results  Component Value Date   MG 2.0 11/28/2018   MG 1.8 06/23/2018   MG 2.3 05/13/2015   Lab Results  Component Value Date   VD25OH 28 (L) 01/06/2010   VD25OH 23 (L) 07/02/2009    Lab Results  Component Value Date   PREALBUMIN 26.0 07/19/2017   CBC EXTENDED Latest Ref Rng & Units 07/21/2019 06/22/2019 02/08/2019  WBC 4.0 - 10.5 K/uL 7.6 8.9 9.4  RBC 3.87 - 5.11 MIL/uL 4.40 4.51 4.45  HGB 12.0 - 15.0 g/dL 12.1 12.3 12.3  HCT 36.0 - 46.0 % 36.5 38.2 36.1  PLT 150 - 400 K/uL 239 300 308  NEUTROABS 1.7 - 7.7 K/uL 4.5 5.8 -  LYMPHSABS 0.7 - 4.0 K/uL 2.3 2.1 -     Body mass index is 31.89 kg/m.  Orders:  No orders of the defined types were placed in this encounter.  No orders of the defined types were  placed in this encounter.    Procedures: No procedures performed  Clinical Data: No additional findings.  ROS:  All other systems negative, except as noted in the HPI. Review of Systems  Objective: Vital Signs: Ht 5\' 3"  (1.6 m)   Wt 180 lb (81.6 kg)   LMP  (LMP Unknown)   BMI 31.89 kg/m   Specialty Comments:  No specialty comments available.  PMFS History: Patient Active Problem List   Diagnosis Date Noted  . Diarrhea   . Hyperthyroidism   . Insulin dependent diabetes mellitus   . Orthostasis   . Vaginal discharge   . Infectious diarrhea 11/26/2018  . Hyperkalemia 11/26/2018  . Bacteremia 06/21/2018  . Sepsis secondary to UTI (HCC) 06/20/2018  . Great toe amputation status, right 08/05/2017  . Mild renal insufficiency 07/19/2017  . AKI (acute kidney injury) (HCC) 03/04/2016  . Essential hypertension 05/14/2015  . CAD- with distal LAD disease, ? 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 05/14/2015  . NSTEMI  (non-ST elevated myocardial infarction) (HCC)   . Syncope 05/13/2015  . Hyperglycemia 10/25/2014  . Abrasion of thigh, left 10/25/2014  . DM (diabetes mellitus), type 2, uncontrolled (HCC)   . DM hyperosmolar coma, type 2 (HCC) 05/22/2012  . Hyperosmolar syndrome 05/21/2012   Past Medical History:  Diagnosis Date  . Anemia   . Diabetes mellitus   . Dyslipidemia   . Hypertension   . Non-STEMI (non-ST elevated myocardial infarction) [facility])    Medical therapy for distal LAD dissection  . Thyroid disease     Family History  Problem Relation Age of Onset  . Hypertension Father   . Hyperlipidemia Father   . Cancer Mother   . Diabetes Sister   . Diabetes Brother   . Diabetes Sister   . Diabetes Sister   . Diabetes Sister   . Diabetes Brother   . Diabetes Brother   . Diabetes Brother   . Diabetes Brother     Past Surgical History:  Procedure Laterality Date  . AMPUTATION Right 07/21/2017   Procedure: RIGHT GREAT TOE AMPUTATION;  Surgeon: 07/23/2017, MD;  Location: [facility] OR;  Service: Orthopedics;  Laterality: Right;  . CARDIAC CATHETERIZATION N/A 05/14/2015   Procedure: Left Heart Cath and Coronary Angiography;  Surgeon: 05/16/2015, MD; dLAD 60%>>95% (small vessel, ?dissection), CFX & RCA systems no sig dz, EF & LVEDP nl      Social History   Occupational History  . Occupation: unemployed  Tobacco Use  . Smoking status: Never Smoker  . Smokeless tobacco: Never Used  Substance and Sexual Activity  . Alcohol use: No  . Drug use: No  . Sexual activity: Yes

## 2019-10-16 MED FILL — GABAPENTIN 600 MG TABLET: 600 | 30 days supply | Qty: 180 | Fill #1

## 2019-10-23 MED FILL — NOVOLOG FLEXPEN SYRINGE: 100 | 32 days supply | Qty: 15 | Fill #4

## 2019-10-23 MED FILL — HYDROCHLOROTHIAZIDE 12.5 MG: 12.5 | 30 days supply | Qty: 30 | Fill #3

## 2019-10-23 MED FILL — LOSARTAN POTASSIUM 50 MG TA: 50 | 30 days supply | Qty: 30 | Fill #1

## 2019-10-23 MED FILL — !LANTUS SOLOSTAR 100UNITS/M: 100 | 17 days supply | Qty: 12 | Fill #8

## 2019-10-27 MED FILL — methIMAzole 5 MG TABS: 5 | 84 days supply | Qty: 36 | Fill #5

## 2019-10-31 ENCOUNTER — Ambulatory Visit: Payer: Self-pay

## 2019-10-31 ENCOUNTER — Other Ambulatory Visit: Payer: Self-pay

## 2019-10-31 MED FILL — ATORVASTATIN CALCIUM 20 MG: 20 | 30 days supply | Qty: 30 | Fill #1

## 2019-11-07 ENCOUNTER — Ambulatory Visit: Payer: Self-pay | Admitting: Physician Assistant

## 2019-11-13 MED FILL — GABAPENTIN 600 MG TABLET: 600 | 30 days supply | Qty: 180 | Fill #0

## 2019-11-13 MED FILL — !LANTUS SOLOSTAR 100UNITS/M: 100 | 17 days supply | Qty: 12 | Fill #9

## 2019-11-14 ENCOUNTER — Ambulatory Visit (INDEPENDENT_AMBULATORY_CARE_PROVIDER_SITE_OTHER): Payer: Self-pay | Admitting: Primary Care

## 2019-11-14 ENCOUNTER — Other Ambulatory Visit: Payer: Self-pay

## 2019-11-14 ENCOUNTER — Encounter (INDEPENDENT_AMBULATORY_CARE_PROVIDER_SITE_OTHER): Payer: Self-pay | Admitting: Primary Care

## 2019-11-14 VITALS — BP 152/93 | HR 91 | Temp 97.2°F | Ht 63.0 in | Wt 183.4 lb

## 2019-11-14 DIAGNOSIS — E119 Type 2 diabetes mellitus without complications: Secondary | ICD-10-CM

## 2019-11-14 DIAGNOSIS — Z794 Long term (current) use of insulin: Secondary | ICD-10-CM

## 2019-11-14 DIAGNOSIS — E1165 Type 2 diabetes mellitus with hyperglycemia: Secondary | ICD-10-CM

## 2019-11-14 DIAGNOSIS — E7841 Elevated Lipoprotein(a): Secondary | ICD-10-CM

## 2019-11-14 DIAGNOSIS — I10 Essential (primary) hypertension: Secondary | ICD-10-CM

## 2019-11-14 DIAGNOSIS — Z760 Encounter for issue of repeat prescription: Secondary | ICD-10-CM

## 2019-11-14 LAB — POCT CBG (FASTING - GLUCOSE)-MANUAL ENTRY: Glucose Fasting, POC: 401 mg/dL — AB (ref 70–99)

## 2019-11-14 MED ORDER — PEN NEEDLES 31G X 8 MM MISC: 1.0000 | Freq: Every day | 5 refills | Status: DC

## 2019-11-14 MED ORDER — LANTUS SOLOSTAR 100 UNIT/ML ~~LOC~~ SOPN: 34.0000 [IU] | PEN_INJECTOR | Freq: Two times a day (BID) | SUBCUTANEOUS | 6 refills | Status: DC

## 2019-11-14 MED ORDER — LOSARTAN POTASSIUM 100 MG PO TABS: 100.0000 mg | ORAL_TABLET | Freq: Every day | ORAL | 1 refills | Status: DC

## 2019-11-14 MED ORDER — METFORMIN HCL ER 500 MG PO TB24: 1000.0000 mg | ORAL_TABLET | Freq: Two times a day (BID) | ORAL | 1 refills | Status: DC

## 2019-11-14 MED ORDER — HYDROCHLOROTHIAZIDE 25 MG PO TABS: 25.0000 mg | ORAL_TABLET | Freq: Every day | ORAL | 1 refills | Status: DC

## 2019-11-14 MED ORDER — INSULIN ASPART 100 UNIT/ML ~~LOC~~ SOLN: 15.0000 [IU] | Freq: Three times a day (TID) | SUBCUTANEOUS | 3 refills | Status: DC

## 2019-11-14 MED ORDER — SITAGLIPTIN PHOSPHATE 100 MG PO TABS: 100.0000 mg | ORAL_TABLET | Freq: Every day | ORAL | 1 refills | Status: DC

## 2019-11-14 MED ORDER — ATORVASTATIN CALCIUM 20 MG PO TABS: 20.0000 mg | ORAL_TABLET | Freq: Every day | ORAL | 1 refills | Status: DC

## 2019-11-14 MED ORDER — GABAPENTIN 600 MG PO TABS: ORAL_TABLET | ORAL | 1 refills | Status: DC

## 2019-11-14 MED FILL — TRUEPLUS PEN NDL 31GX5/16: 31G X 8 MM | 20 days supply | Qty: 100 | Fill #0

## 2019-11-14 MED FILL — ATORVASTATIN CALCIUM 20 MG: 20 | 30 days supply | Qty: 30 | Fill #0

## 2019-11-14 MED FILL — metFORMIN HCL ER 500 MG TB2: 500 | 30 days supply | Qty: 120 | Fill #0

## 2019-11-14 MED FILL — TRUEPLUS PEN NDL 31GX5/16": 31G X 8 MM | 20 days supply | Qty: 100 | Fill #0

## 2019-11-14 NOTE — Progress Notes (Signed)
Subjective:  Patient ID: Rebecca [name], female    DOB: 1966-06-14  Age: 54 y.o. MRN: [id number]  CC: Blood Pressure Check and Medication Refill   HPI Damonie I Woodburn presents for management of diabetes fasting blood sugars 401. Explain there is no way you are taking your medication with this reading patient than admits she has been out she has to pay someone to bring her to this appointment . This was asked because all refills were done at this visit and I thought since she was at this appointment she could go and pick up her mediations. She denies any problems or concern other than awaiting medication to be mailed to her. Outpatient Medications Prior to Visit  Medication Sig Dispense Refill  . aspirin 81 MG EC tablet Take 1 tablet (81 mg total) by mouth daily. 90 tablet 1  . doxycycline (VIBRA-TABS) 100 MG tablet Take 1 tablet (100 mg total) by mouth 2 (two) times daily. 28 tablet 0  . methimazole (TAPAZOLE) 5 MG tablet Take 1 tablet (5 mg total) by mouth every Monday, Wednesday, and Friday. 90 tablet 3  . mupirocin ointment (BACTROBAN) 2 % Apply 1 application topically daily. 22 g 6  . nitroGLYCERIN (NITROSTAT) 0.4 MG SL tablet Place 1 tablet (0.4 mg total) under the tongue every 5 (five) minutes as needed for chest pain. 25 tablet 3  . polyethylene glycol (MIRALAX / GLYCOLAX) packet Take 17 g by mouth daily. 14 each 0  . atorvastatin (LIPITOR) 20 MG tablet Take 1 tablet (20 mg total) by mouth at bedtime. 90 tablet 1  . gabapentin (NEURONTIN) 600 MG tablet TAKE 2 TABLETS BY MOUTH 3 TIMES DAILY. 180 tablet 1  . insulin aspart (NOVOLOG) 100 UNIT/ML injection Inject 15 Units into the skin 3 (three) times daily before meals. 15 mL 3  . Insulin Pen Needle (PEN NEEDLES) 31G X 8 MM MISC Inject 1 each into the skin at bedtime. 30 each 5  . losartan (COZAAR) 100 MG tablet Take 1 tablet (100 mg total) by mouth daily. 90 tablet 1  . metFORMIN (GLUCOPHAGE XR) 500 MG 24 hr tablet Take 2  tablets (1,000 mg total) by mouth 2 (two) times daily after a meal. 120 tablet 1  . sitaGLIPtin (JANUVIA) 100 MG tablet Take 1 tablet (100 mg total) by mouth daily. 90 tablet 1  . hydrochlorothiazide (HYDRODIURIL) 12.5 MG tablet Take 1 tablet (12.5 mg total) by mouth daily. 90 tablet 1  . Insulin Glargine (LANTUS SOLOSTAR) 100 UNIT/ML Solostar Pen Inject 34 Units into the skin 2 (two) times daily. 60 pen 1   No facility-administered medications prior to visit.    ROS Review of Systems  All other systems reviewed and are negative.   Objective:  BP (!) 152/93 (BP Location: Right Arm, Patient Position: Sitting, Cuff Size: Normal)   Pulse 91   Temp (!) 97.2 F (36.2 C) (Temporal)   Ht 5\' 3"  (1.6 m)   Wt 183 lb 6.4 oz (83.2 kg)   LMP  (LMP Unknown)   SpO2 97%   BMI 32.49 kg/m   BP/Weight 11/14/2019 10/10/2019 5/32/9924  Systolic BP 268 - 341  Diastolic BP 93 - 88  Wt. (Lbs) 183.4 180 180  BMI 32.49 31.89 31.89    Physical Exam Vitals reviewed.  Constitutional:      Appearance: Normal appearance.  HENT:     Head: Normocephalic.  Cardiovascular:     Rate and Rhythm: Normal rate and regular rhythm.  Pulses: Normal pulses.     Heart sounds: Normal heart sounds.  Pulmonary:     Effort: Pulmonary effort is normal.     Breath sounds: Normal breath sounds.  Abdominal:     General: Bowel sounds are normal.  Musculoskeletal:        General: Normal range of motion.     Cervical back: Normal range of motion.  Skin:    General: Skin is warm and dry.  Neurological:     Mental Status: She is alert and oriented to person, place, and time.  Psychiatric:        Behavior: Behavior normal.      Assessment & Plan:  Camree was seen today for blood pressure check and medication refill.  Diagnoses and all orders for this visit:  Uncontrolled type 2 diabetes mellitus with hyperglycemia (HCC) Goal of therapy:</-6.5 hemoglobin A1c. Made clear about what medications she was  supposed to take and dosage verbalized understanding.  Discussed decreasing high in carbohydrates are the following rice, potatoes, breads, sugars, and pastas.  Reduction in the intake (eating) will assist in lowering your blood sugars. -     Glucose (CBG), Fasting -     Insulin Glargine (LANTUS SOLOSTAR) 100 UNIT/ML Solostar Pen; Inject 34 Units into the skin 2 (two) times daily. -     Insulin Pen Needle (PEN NEEDLES) 31G X 8 MM MISC; Inject 1 each into the skin at bedtime. -     gabapentin (NEURONTIN) 600 MG tablet; TAKE 2 TABLETS BY MOUTH 3 TIMES DAILY.  Medication refill -     Insulin Glargine (LANTUS SOLOSTAR) 100 UNIT/ML Solostar Pen; Inject 34 Units into the skin 2 (two) times daily. -     sitaGLIPtin (JANUVIA) 100 MG tablet; Take 1 tablet (100 mg total) by mouth daily. -     metFORMIN (GLUCOPHAGE XR) 500 MG 24 hr tablet; Take 2 tablets (1,000 mg total) by mouth 2 (two) times daily after a meal. -     Insulin Pen Needle (PEN NEEDLES) 31G X 8 MM MISC; Inject 1 each into the skin at bedtime. -     gabapentin (NEURONTIN) 600 MG tablet; TAKE 2 TABLETS BY MOUTH 3 TIMES DAILY. -     losartan (COZAAR) 100 MG tablet; Take 1 tablet (100 mg total) by mouth daily. -     atorvastatin (LIPITOR) 20 MG tablet; Take 1 tablet (20 mg total) by mouth at bedtime.  Hypertension, unspecified type Not controlled did not take medication fasting for labs. Blood pressure goal of less than 130/80, low-sodium, medication compliance, 150 minutes of  exercise per week. Discussed medication compliance, adverse effects. -     losartan (COZAAR) 100 MG tablet; Take 1 tablet (100 mg total) by mouth daily.  Elevated lipoprotein(a) Discussed significance in taking atorvastatin 20 mg can lower your  cholesterol which can lead to heart attack and stroke.   -     atorvastatin (LIPITOR) 20 MG tablet; Take 1 tablet (20 mg total) by mouth at bedtime. -     Lipid Panel  Other orders -     insulin aspart (NOVOLOG) 100 UNIT/ML  injection; Inject 15 Units into the skin 3 (three) times daily before meals. -     hydrochlorothiazide (HYDRODIURIL) 25 MG tablet; Take 1 tablet (25 mg total) by mouth daily.   There are no diagnoses linked to this encounter.  Meds ordered this encounter  Medications  . Insulin Glargine (LANTUS SOLOSTAR) 100 UNIT/ML Solostar Pen  Sig: Inject 34 Units into the skin 2 (two) times daily.    Dispense:  5 pen    Refill:  6  . insulin aspart (NOVOLOG) 100 UNIT/ML injection    Sig: Inject 15 Units into the skin 3 (three) times daily before meals.    Dispense:  15 mL    Refill:  3  . sitaGLIPtin (JANUVIA) 100 MG tablet    Sig: Take 1 tablet (100 mg total) by mouth daily.    Dispense:  90 tablet    Refill:  1  . metFORMIN (GLUCOPHAGE XR) 500 MG 24 hr tablet    Sig: Take 2 tablets (1,000 mg total) by mouth 2 (two) times daily after a meal.    Dispense:  120 tablet    Refill:  1  . Insulin Pen Needle (PEN NEEDLES) 31G X 8 MM MISC    Sig: Inject 1 each into the skin at bedtime.    Dispense:  30 each    Refill:  5  . gabapentin (NEURONTIN) 600 MG tablet    Sig: TAKE 2 TABLETS BY MOUTH 3 TIMES DAILY.    Dispense:  180 tablet    Refill:  1  . losartan (COZAAR) 100 MG tablet    Sig: Take 1 tablet (100 mg total) by mouth daily.    Dispense:  90 tablet    Refill:  1  . hydrochlorothiazide (HYDRODIURIL) 25 MG tablet    Sig: Take 1 tablet (25 mg total) by mouth daily.    Dispense:  90 tablet    Refill:  1  . atorvastatin (LIPITOR) 20 MG tablet    Sig: Take 1 tablet (20 mg total) by mouth at bedtime.    Dispense:  90 tablet    Refill:  1    Follow-up: Return in 7 weeks (on 01/01/2020) for schedule Pap Follow up in 3 months for diabetes and blood pressure.   Grayce Sessions NP

## 2019-11-14 NOTE — Patient Instructions (Signed)
Diabetes mellitus y nutricin, en adultos Diabetes Mellitus and Nutrition, Adult Si sufre de diabetes (diabetes mellitus), es muy importante tener hbitos alimenticios saludables debido a que sus niveles de azcar en la sangre (glucosa) se ven afectados en gran medida por lo que come y bebe. Comer alimentos saludables en las cantidades adecuadas, aproximadamente a la misma hora todos los das, lo ayudar a:  Controlar la glucemia.  Disminuir el riesgo de sufrir una enfermedad cardaca.  Mejorar la presin arterial.  Alcanzar o mantener un peso saludable. Todas las personas que sufren de diabetes son diferentes y cada una tiene necesidades diferentes en cuanto a un plan de alimentacin. El mdico puede recomendarle que trabaje con un especialista en dietas y nutricin (nutricionista) para elaborar el mejor plan para usted. Su plan de alimentacin puede variar segn factores como:  Las caloras que necesita.  Los medicamentos que toma.  Su peso.  Sus niveles de glucemia, presin arterial y colesterol.  Su nivel de actividad.  Otras afecciones que tenga, como enfermedades cardacas o renales. Cmo me afectan los carbohidratos? Los carbohidratos, o hidratos de carbono, afectan su nivel de glucemia ms que cualquier otro tipo de alimento. La ingesta de carbohidratos naturalmente aumenta la cantidad de glucosa en la sangre. El recuento de carbohidratos es un mtodo destinado a llevar un registro de la cantidad de carbohidratos que se consumen. El recuento de carbohidratos es importante para mantener la glucemia a un nivel saludable, especialmente si utiliza insulina o toma determinados medicamentos por va oral para la diabetes. Es importante conocer la cantidad de carbohidratos que se pueden ingerir en cada comida sin correr ningn riesgo. Esto es diferente en cada persona. Su nutricionista puede ayudarlo a calcular la cantidad de carbohidratos que debe ingerir en cada comida y en cada  refrigerio. Entre los alimentos que contienen carbohidratos, se incluyen:  Pan, cereal, arroz, pastas y galletas.  Papas y maz.  Guisantes, frijoles y lentejas.  Leche y yogur.  Frutas y jugo.  Postres, como pasteles, galletas, helado y caramelos. Cmo me afecta el alcohol? El alcohol puede provocar disminuciones sbitas de la glucemia (hipoglucemia), especialmente si utiliza insulina o toma determinados medicamentos por va oral para la diabetes. La hipoglucemia es una afeccin potencialmente mortal. Los sntomas de la hipoglucemia (somnolencia, mareos y confusin) son similares a los sntomas de haber consumido demasiado alcohol. Si el mdico afirma que el alcohol es seguro para usted, siga estas pautas:  Limite el consumo de alcohol a no ms de 1medida por da si es mujer y no est embarazada, y a 2medidas si es hombre. Una medida equivale a 12oz (355ml) de cerveza, 5oz (148ml) de vino o 1oz (44ml) de bebidas alcohlicas de alta graduacin.  No beba con el estmago vaco.  Mantngase hidratado bebiendo agua, refrescos dietticos o t helado sin azcar.  Tenga en cuenta que los refrescos comunes, los jugos y otras bebida para mezclar pueden contener mucha azcar y se deben contar como carbohidratos. Cules son algunos consejos para seguir este plan?  Leer las etiquetas de los alimentos  Comience por leer el tamao de la porcin en la "Informacin nutricional" en las etiquetas de los alimentos envasados y las bebidas. La cantidad de caloras, carbohidratos, grasas y otros nutrientes mencionados en la etiqueta se basan en una porcin del alimento. Muchos alimentos contienen ms de una porcin por envase.  Verifique la cantidad total de gramos (g) de carbohidratos totales en una porcin. Puede calcular la cantidad de porciones de carbohidratos al dividir el   total de carbohidratos por 15. Por ejemplo, si un alimento tiene un total de 30g de carbohidratos, equivale a 2  porciones de carbohidratos.  Verifique la cantidad de gramos (g) de grasas saturadas y grasas trans en una porcin. Escoja alimentos que no contengan grasa o que tengan un bajo contenido.  Verifique la cantidad de miligramos (mg) de sal (sodio) en una porcin. La mayora de las personas deben limitar la ingesta de sodio total a menos de 2300mg por da.  Siempre consulte la informacin nutricional de los alimentos etiquetados como "con bajo contenido de grasa" o "sin grasa". Estos alimentos pueden tener un mayor contenido de azcar agregada o carbohidratos refinados, y deben evitarse.  Hable con su nutricionista para identificar sus objetivos diarios en cuanto a los nutrientes mencionados en la etiqueta. Al ir de compras  Evite comprar alimentos procesados, enlatados o precocinados. Estos alimentos tienden a tener una mayor cantidad de grasa, sodio y azcar agregada.  Compre en la zona exterior de la tienda de comestibles. Esta zona incluye frutas y verduras frescas, granos a granel, carnes frescas y productos lcteos frescos. Al cocinar  Utilice mtodos de coccin a baja temperatura, como hornear, en lugar de mtodos de coccin a alta temperatura, como frer en abundante aceite.  Cocine con aceites saludables, como el aceite de oliva, canola o girasol.  Evite cocinar con manteca, crema o carnes con alto contenido de grasa. Planificacin de las comidas  Coma las comidas y los refrigerios regularmente, preferentemente a la misma hora todos los das. Evite pasar largos perodos de tiempo sin comer.  Consuma alimentos ricos en fibra, como frutas frescas, verduras, frijoles y cereales integrales. Consulte a su nutricionista sobre cuntas porciones de carbohidratos puede consumir en cada comida.  Consuma entre 4 y 6 onzas (oz) de protenas magras por da, como carnes magras, pollo, pescado, huevos o tofu. Una onza de protena magra equivale a: ? 1 onza de carne, pollo o  pescado. ? 1huevo. ?  taza de tofu.  Coma algunos alimentos por da que contengan grasas saludables, como aguacates, frutos secos, semillas y pescado. Estilo de vida  Controle su nivel de glucemia con regularidad.  Haga actividad fsica habitualmente como se lo haya indicado el mdico. Esto puede incluir lo siguiente: ? 150minutos semanales de ejercicio de intensidad moderada o alta. Esto podra incluir caminatas dinmicas, ciclismo o gimnasia acutica. ? Realizar ejercicios de elongacin y de fortalecimiento, como yoga o levantamiento de pesas, por lo menos 2veces por semana.  Tome los medicamentos como se lo haya indicado el mdico.  No consuma ningn producto que contenga nicotina o tabaco, como cigarrillos y cigarrillos electrnicos. Si necesita ayuda para dejar de fumar, consulte al mdico.  Trabaje con un asesor o instructor en diabetes para identificar estrategias para controlar el estrs y cualquier desafo emocional y social. Preguntas para hacerle al mdico  Es necesario que consulte a un instructor en el cuidado de la diabetes?  Es necesario que me rena con un nutricionista?  A qu nmero puedo llamar si tengo preguntas?  Cules son los mejores momentos para controlar la glucemia? Dnde encontrar ms informacin:  Asociacin Estadounidense de la Diabetes (American Diabetes Association): diabetes.org  Academia de Nutricin y Diettica (Academy of Nutrition and Dietetics): www.eatright.org  Instituto Nacional de la Diabetes y las Enfermedades Digestivas y Renales (National Institute of Diabetes and Digestive and Kidney Diseases, NIH): www.niddk.nih.gov Resumen  Un plan de alimentacin saludable lo ayudar a controlar la glucemia y mantener un estilo de vida saludable.    Trabajar con un especialista en dietas y nutricin (nutricionista) puede ayudarlo a elaborar el mejor plan de alimentacin para usted.  Tenga en cuenta que los carbohidratos (hidratos de  carbono) y el alcohol tienen efectos inmediatos en sus niveles de glucemia. Es importante contar los carbohidratos que ingiere y consumir alcohol con prudencia. Esta informacin no tiene como fin reemplazar el consejo del mdico. Asegrese de hacerle al mdico cualquier pregunta que tenga. Document Revised: 05/18/2017 Document Reviewed: 12/28/2016 Elsevier Patient Education  2020 [employer]  

## 2019-11-15 ENCOUNTER — Other Ambulatory Visit (INDEPENDENT_AMBULATORY_CARE_PROVIDER_SITE_OTHER): Payer: Self-pay | Admitting: Primary Care

## 2019-11-15 LAB — CMP14+EGFR
ALT: 192 IU/L — ABNORMAL HIGH (ref 0–32)
AST: 105 IU/L — ABNORMAL HIGH (ref 0–40)
Albumin/Globulin Ratio: 1.4 (ref 1.2–2.2)
Albumin: 4.5 g/dL (ref 3.8–4.9)
Alkaline Phosphatase: 259 IU/L — ABNORMAL HIGH (ref 39–117)
BUN/Creatinine Ratio: 22 (ref 9–23)
BUN: 30 mg/dL — ABNORMAL HIGH (ref 6–24)
Bilirubin Total: 0.4 mg/dL (ref 0.0–1.2)
CO2: 25 mmol/L (ref 20–29)
Calcium: 9.9 mg/dL (ref 8.7–10.2)
Chloride: 98 mmol/L (ref 96–106)
Creatinine, Ser: 1.39 mg/dL — ABNORMAL HIGH (ref 0.57–1.00)
GFR calc Af Amer: 50 mL/min/{1.73_m2} — ABNORMAL LOW (ref >59)
GFR calc non Af Amer: 43 mL/min/{1.73_m2} — ABNORMAL LOW (ref >59)
Globulin, Total: 3.3 g/dL (ref 1.5–4.5)
Glucose: 391 mg/dL — ABNORMAL HIGH (ref 65–99)
Potassium: 5.5 mmol/L — ABNORMAL HIGH (ref 3.5–5.2)
Sodium: 139 mmol/L (ref 134–144)
Total Protein: 7.8 g/dL (ref 6.0–8.5)

## 2019-11-15 LAB — CBC WITH DIFFERENTIAL/PLATELET
Basophils Absolute: 0.1 10*3/uL (ref 0.0–0.2)
Basos: 1 %
EOS (ABSOLUTE): 0.2 10*3/uL (ref 0.0–0.4)
Eos: 3 %
Hematocrit: 37.8 % (ref 34.0–46.6)
Hemoglobin: 13 g/dL (ref 11.1–15.9)
Immature Grans (Abs): 0 10*3/uL (ref 0.0–0.1)
Immature Granulocytes: 0 %
Lymphocytes Absolute: 1.7 10*3/uL (ref 0.7–3.1)
Lymphs: 23 %
MCH: 28.6 pg (ref 26.6–33.0)
MCHC: 34.4 g/dL (ref 31.5–35.7)
MCV: 83 fL (ref 79–97)
Monocytes Absolute: 0.5 10*3/uL (ref 0.1–0.9)
Monocytes: 7 %
Neutrophils Absolute: 4.9 10*3/uL (ref 1.4–7.0)
Neutrophils: 66 %
Platelets: 316 10*3/uL (ref 150–450)
RBC: 4.54 x10E6/uL (ref 3.77–5.28)
RDW: 12.9 % (ref 11.7–15.4)
WBC: 7.5 10*3/uL (ref 3.4–10.8)

## 2019-11-15 LAB — LIPID PANEL
Chol/HDL Ratio: 5 ratio — ABNORMAL HIGH (ref 0.0–4.4)
Cholesterol, Total: 232 mg/dL — ABNORMAL HIGH (ref 100–199)
HDL: 46 mg/dL (ref >39)
LDL Chol Calc (NIH): 151 mg/dL — ABNORMAL HIGH (ref 0–99)
Triglycerides: 191 mg/dL — ABNORMAL HIGH (ref 0–149)
VLDL Cholesterol Cal: 35 mg/dL (ref 5–40)

## 2019-11-15 MED ORDER — POTASSIUM CHLORIDE CRYS ER 10 MEQ PO TBCR: 10.0000 meq | EXTENDED_RELEASE_TABLET | Freq: Every day | ORAL | 1 refills | Status: DC

## 2019-11-15 MED FILL — POTASSIUM CHLORIDE ER 10 ME: 10 | 30 days supply | Qty: 30 | Fill #0

## 2019-11-27 MED FILL — HYDROCHLOROTHIAZIDE 12.5 MG: 12.5 | 30 days supply | Qty: 30 | Fill #4

## 2019-11-30 MED FILL — !LANTUS SOLOSTAR 100UNITS/M: 100 | 17 days supply | Qty: 12 | Fill #10

## 2019-12-04 ENCOUNTER — Other Ambulatory Visit (INDEPENDENT_AMBULATORY_CARE_PROVIDER_SITE_OTHER): Payer: Self-pay | Admitting: Primary Care

## 2019-12-04 DIAGNOSIS — Z794 Long term (current) use of insulin: Secondary | ICD-10-CM

## 2019-12-04 DIAGNOSIS — E119 Type 2 diabetes mellitus without complications: Secondary | ICD-10-CM

## 2019-12-04 DIAGNOSIS — Z760 Encounter for issue of repeat prescription: Secondary | ICD-10-CM

## 2019-12-04 IMAGING — CT CT RENAL STONE PROTOCOL
2 of 4 series · 16 of 46 positions shown, 18 images · non-contrast
Comparison: September 22, 2004

CLINICAL DATA: Right flank pain with nausea

EXAM:
CT ABDOMEN AND PELVIS WITHOUT CONTRAST
TECHNIQUE: Multidetector CT imaging of the abdomen and pelvis was performed
following the standard protocol without oral or IV contrast.

[Series 3: stone study 5.0 i30f 2 · axial · 0.71mm/px · z∈[+705,+1165]mm · 13 of 100 slices shown, 15 images]
[im 4/100  soft-tissue]
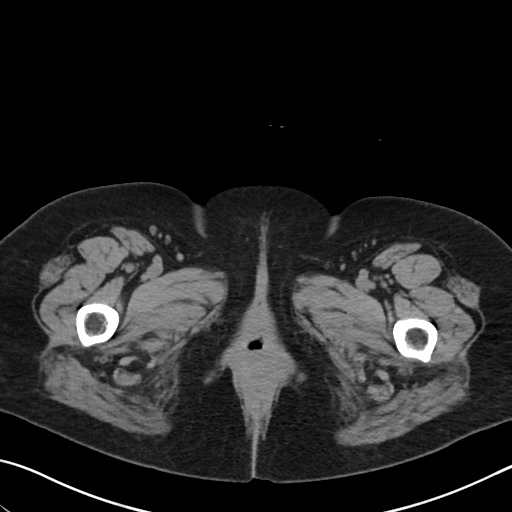
[im 4/100  bone]
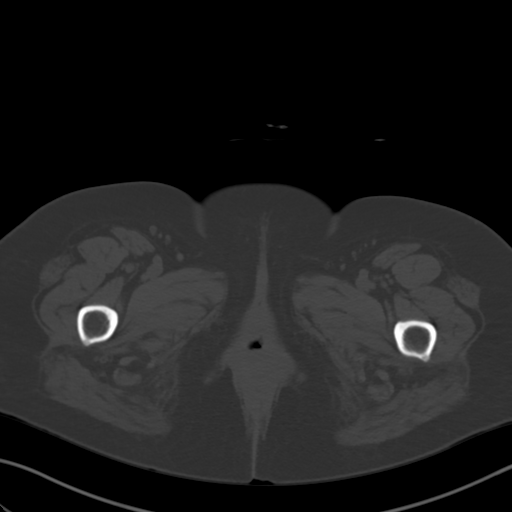
[im 12/100  soft-tissue]
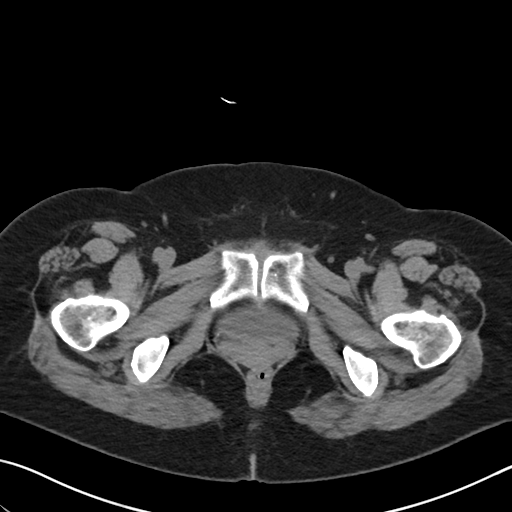
[im 20/100  soft-tissue]
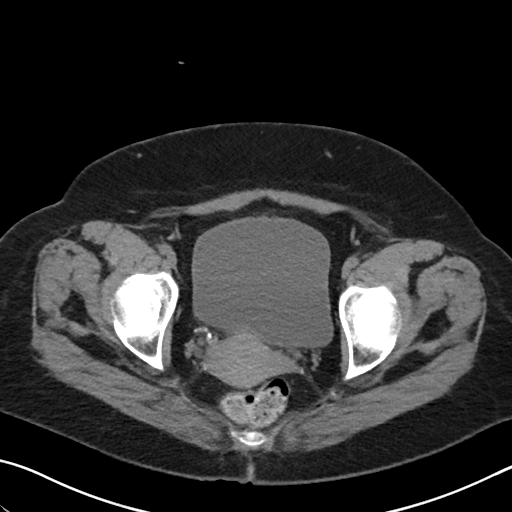
[im 28/100  soft-tissue]
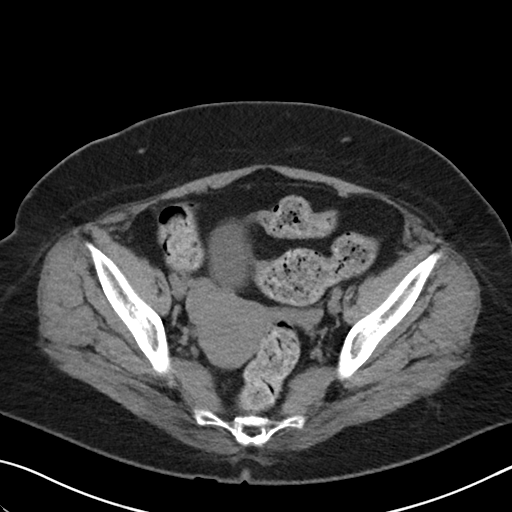
[im 36/100  soft-tissue]
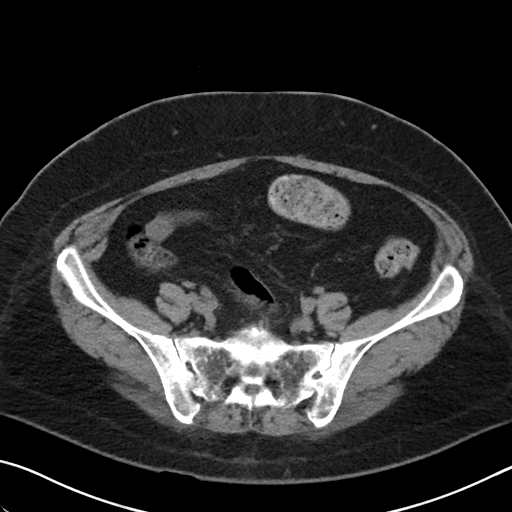
[im 44/100  soft-tissue]
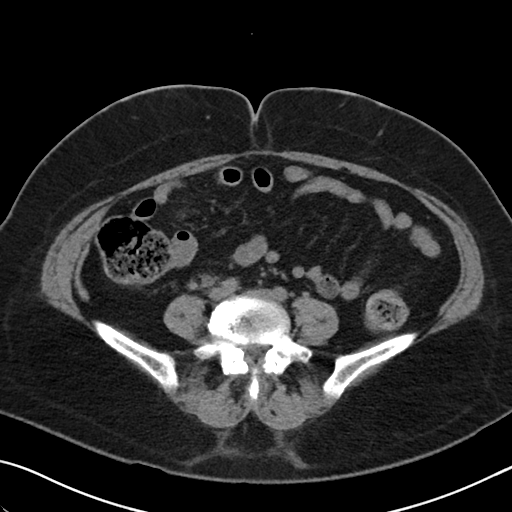
[im 52/100  soft-tissue]
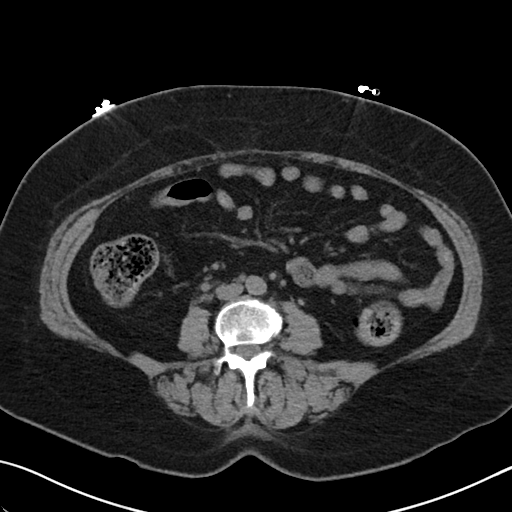
[im 56/100  soft-tissue]
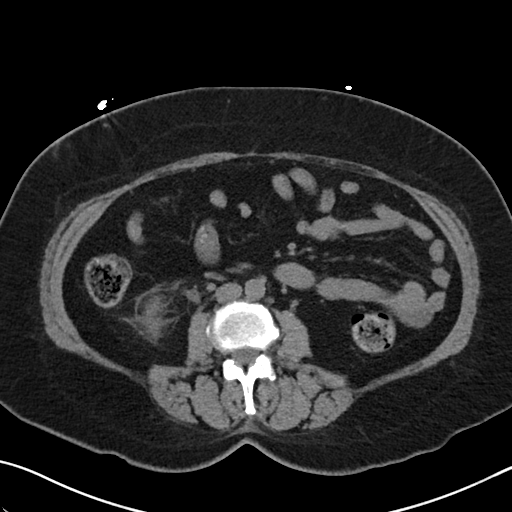
[im 64/100  soft-tissue]
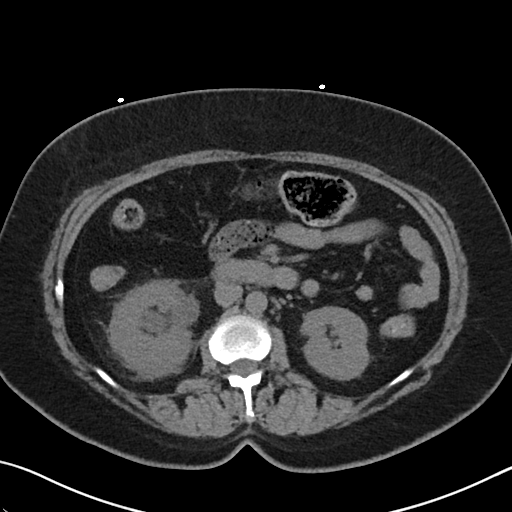
[im 64/100  bone]
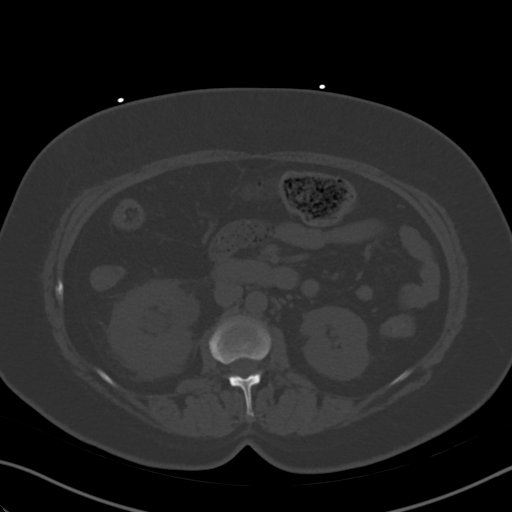
[im 72/100  soft-tissue]
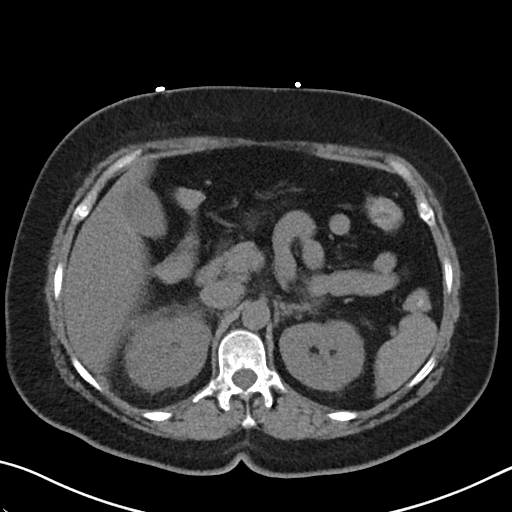
[im 80/100  soft-tissue]
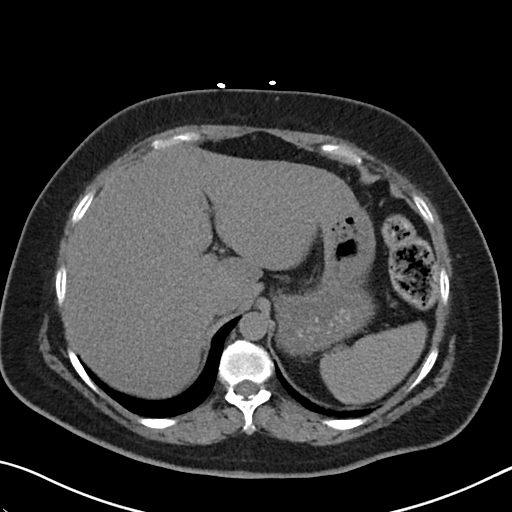
[im 88/100  soft-tissue]
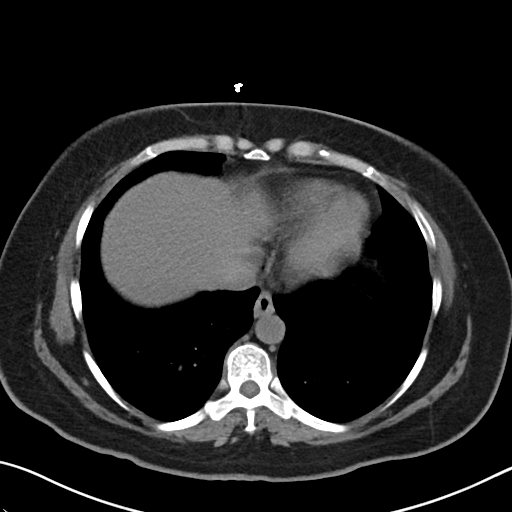
[im 96/100  soft-tissue]
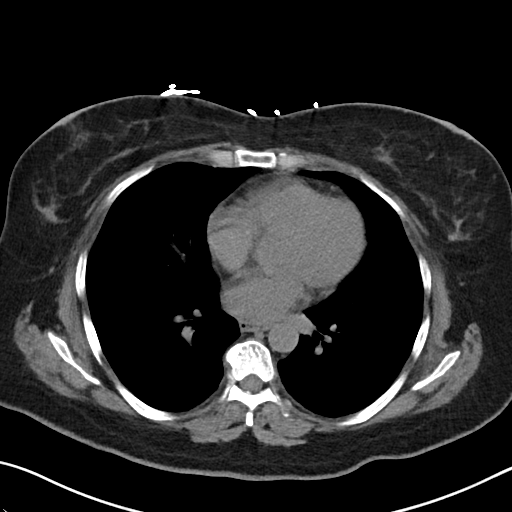

[Series 6: coronal soft tissue · coronal · 0.89mm/px · 3 of 98 slices shown]
[im 33/98  soft-tissue]
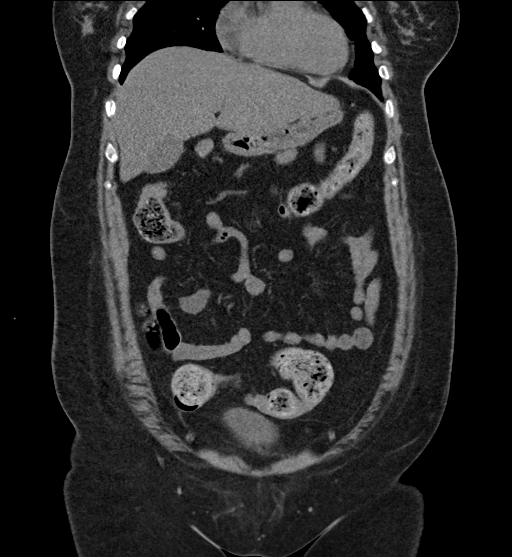
[im 44/98  soft-tissue]
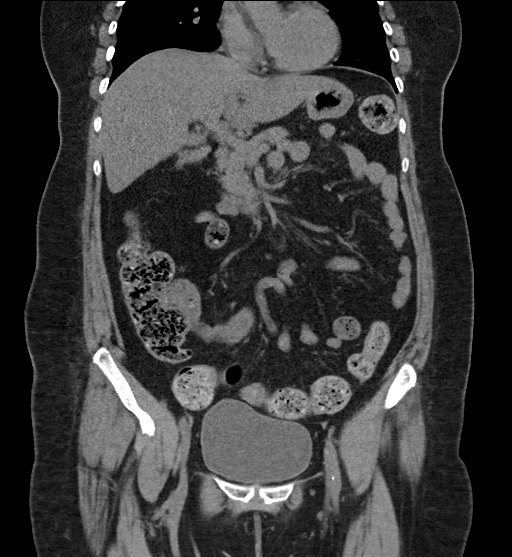
[im 54/98  soft-tissue]
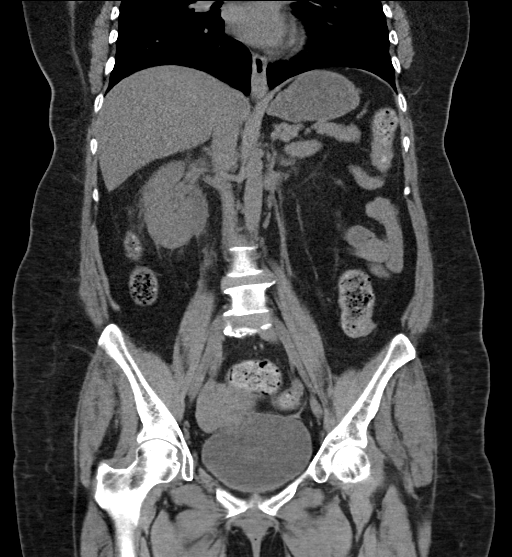

[16 of 46 positions shown; findings below may reference images not displayed]

FINDINGS: Lower chest: Lung bases are clear.

Hepatobiliary: No focal liver lesions are appreciable. There is
hepatic steatosis. Gallbladder wall is not appreciably thickened.
There is no biliary duct dilatation.

Pancreas: No pancreatic mass or inflammatory focus.

Spleen: No splenic lesions are evident.

Adrenals/Urinary Tract: Adrenals bilaterally appear unremarkable.
Right kidney is diffusely edematous. There is mild perinephric
stranding on the right. There is moderate hydronephrosis on the
right. There is no hydronephrosis on the left. There is no evident
renal mass on either side. There is no intrarenal calculus on either
side. There is no appreciable ureteral calculus on either side.
There are phleboliths on the right which are near but appear
separate from the distal right ureter. Urinary bladder is midline
with wall thickness within normal limits.

Stomach/Bowel: There is moderate stool throughout the colon. There
is no appreciable bowel wall or mesenteric thickening. There is no
evident bowel obstruction. There is no free air or portal venous
air.

Vascular/Lymphatic: There is no abdominal aortic aneurysm. No
vascular lesions are evident on this noncontrast enhanced study.
There is no adenopathy in the abdomen or pelvis.

Reproductive: Uterus is anteverted. There is no evident pelvic mass.

Other: Appendix appears normal. There is no abscess or ascites in
the abdomen or pelvis. There is a small ventral hernia containing
only fat.

Musculoskeletal: There is degenerative change in the lumbar spine.
There are no blastic or lytic bone lesions. There is no
intramuscular or abdominal wall lesion.
IMPRESSION: 1. Moderate hydronephrosis and ureterectasis on the right. Right
kidney is edematous with moderate perinephric stranding on the
right. No intrarenal or ureteral calculus evident on the right.
Question recent calculus passage to account for these findings.
Pyelonephritis is a differential consideration given this
appearance. No abscess involving the right kidney is evident on this
study.

2. Hepatic steatosis. No focal liver lesions evident on this
noncontrast enhanced study.

3. No evident bowel obstruction. No abscess in the abdomen or pelvis
evident. Appendix appears normal.

4.  Small ventral hernia containing only fat.

## 2019-12-04 NOTE — Telephone Encounter (Signed)
Request for refill of Januvia received. On recent labs in Feb, patient had GFR of 43. In this setting, would need to reduce Januvia to 50 mg daily. Will route to PCP for review.

## 2019-12-05 ENCOUNTER — Other Ambulatory Visit (INDEPENDENT_AMBULATORY_CARE_PROVIDER_SITE_OTHER): Payer: Self-pay | Admitting: Primary Care

## 2019-12-05 DIAGNOSIS — E119 Type 2 diabetes mellitus without complications: Secondary | ICD-10-CM

## 2019-12-05 DIAGNOSIS — Z760 Encounter for issue of repeat prescription: Secondary | ICD-10-CM

## 2019-12-05 MED ORDER — SITAGLIPTIN PHOSPHATE 50 MG PO TABS: 50.0000 mg | ORAL_TABLET | Freq: Every day | ORAL | 1 refills | Status: DC

## 2019-12-07 ENCOUNTER — Other Ambulatory Visit: Payer: Self-pay

## 2019-12-07 ENCOUNTER — Ambulatory Visit (INDEPENDENT_AMBULATORY_CARE_PROVIDER_SITE_OTHER): Payer: Self-pay | Admitting: Physician Assistant

## 2019-12-07 ENCOUNTER — Encounter: Payer: Self-pay | Admitting: Physician Assistant

## 2019-12-07 ENCOUNTER — Ambulatory Visit (INDEPENDENT_AMBULATORY_CARE_PROVIDER_SITE_OTHER): Payer: Self-pay

## 2019-12-07 VITALS — Ht 63.0 in | Wt 183.0 lb

## 2019-12-07 DIAGNOSIS — M86171 Other acute osteomyelitis, right ankle and foot: Secondary | ICD-10-CM

## 2019-12-07 NOTE — Progress Notes (Signed)
Office Visit Note   Patient: Rebecca [name]           Date of Birth: 30-Dec-1965           MRN: [id number] Visit Date: 12/07/2019              Requested by: Kerin Perna, NP [address] [place],  [place] [address] PCP: Kerin Perna, NP  Chief Complaint  Patient presents with  . 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  She has had difficulties in the past with calluses under her forefoot.  She comes in today complaining of a thickened callus under her second metatarsal head  Assessment & Plan: Visit Diagnoses:  1. Acute osteomyelitis of toe of right foot ([place])     Plan: I have given her a metatarsal pad and placed it in her orthotic.  She understands to remove this if it does not help her.  I also instructed her in Achilles cord stretching she will follow-up in 1 month  Follow-Up Instructions: No follow-ups on file.   Ortho Exam  Patient is alert, oriented, no adenopathy, well-dressed, normal affect, normal respiratory effort. Focused examination she is status post right great toe amputation well-healed incision she has no swelling in her foot no erythema no tenderness to palpation.  She does have a prominent callus secondary to a hammertoe deformity.  There is no surrounding cellulitis no drainage.  After obtaining verbal consent this was pared down to a soft healthy tissue.  She has bilateral tight Achilles cords comes to only neutral  Imaging: No results found. No images are attached to the encounter.  Labs: Lab Results  Component Value Date   HGBA1C 11.0 (A) 10/03/2019   HGBA1C 14.7 (H) 07/21/2019   HGBA1C  07/21/2019     Comment:     code 106- a1c too high to read    ESRSEDRATE 54 (H) 07/18/2017   CRP <0.8 07/18/2017   REPTSTATUS 06/27/2018 FINAL 06/22/2018   REPTSTATUS 06/27/2018 FINAL 06/22/2018   CULT  06/22/2018    NO GROWTH 5  DAYS Performed at [facility], [place] [address] [place], [place] [address]    CULT  06/22/2018    NO GROWTH 5 DAYS Performed at [facility] [address] [place], [place] [address]    LABORGA ESCHERICHIA COLI (A) 06/20/2018     Lab Results  Component Value Date   ALBUMIN 4.5 11/14/2019   ALBUMIN 3.8 07/21/2019   ALBUMIN 4.5 06/22/2019   PREALBUMIN 26.0 07/19/2017    Lab Results  Component Value Date   MG 2.0 11/28/2018   MG 1.8 06/23/2018   MG 2.3 05/13/2015   Lab Results  Component Value Date   VD25OH 28 (L) 01/06/2010   VD25OH 23 (L) 07/02/2009    Lab Results  Component Value Date   PREALBUMIN 26.0 07/19/2017   CBC EXTENDED Latest Ref Rng & Units 11/14/2019 07/21/2019 06/22/2019  WBC 3.4 - 10.8 x10E3/uL 7.5 7.6 8.9  RBC 3.77 - 5.28 x10E6/uL 4.54 4.40 4.51  HGB 11.1 - 15.9 g/dL 13.0 12.1 12.3  HCT 34.0 - 46.6 % 37.8 36.5 38.2  PLT 150 - 450 x10E3/uL 316 239 300  NEUTROABS 1.4 - 7.0 x10E3/uL 4.9 4.5 5.8  LYMPHSABS 0.7 - 3.1 x10E3/uL 1.7 2.3 2.1     Body mass index is 32.42 kg/m.  Orders:  Orders Placed This Encounter  Procedures  . XR Foot 2 Views Right   No orders of the defined types were placed in this encounter.    Procedures: No procedures performed  Clinical Data: No additional findings.  ROS:  All other systems negative, except as noted in the HPI. Review of Systems  Objective: Vital Signs: Ht 5\' 3"  (1.6 m)   Wt 183 lb (83 kg)   LMP  (LMP Unknown)   BMI 32.42 kg/m   Specialty Comments:  No specialty comments available.  PMFS History: Patient Active Problem List   Diagnosis Date Noted  . Diarrhea   . Hyperthyroidism   . Insulin dependent diabetes mellitus   . Orthostasis   . Vaginal discharge   . Infectious diarrhea 11/26/2018  . Hyperkalemia 11/26/2018  . Bacteremia 06/21/2018  . Sepsis secondary to UTI (HCC) 06/20/2018  . Great toe amputation status, right 08/05/2017  . Mild renal insufficiency 07/19/2017   . AKI (acute kidney injury) (HCC) 03/04/2016  . Essential hypertension 05/14/2015  . CAD- with distal LAD disease, ? 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 05/14/2015  . NSTEMI (non-ST elevated myocardial infarction) (HCC)   . Syncope 05/13/2015  . Hyperglycemia 10/25/2014  . Abrasion of thigh, left 10/25/2014  . DM (diabetes mellitus), type 2, uncontrolled (HCC)   . DM hyperosmolar coma, type 2 (HCC) 05/22/2012  . Hyperosmolar syndrome 05/21/2012   Past Medical History:  Diagnosis Date  . Anemia   . Diabetes mellitus   . Dyslipidemia   . Hypertension   . Non-STEMI (non-ST elevated myocardial infarction) [facility])    Medical therapy for distal LAD dissection  . Thyroid disease     Family History  Problem Relation Age of Onset  . Hypertension Father   . Hyperlipidemia Father   . Cancer Mother   . Diabetes Sister   . Diabetes Brother   . Diabetes Sister   . Diabetes Sister   . Diabetes Sister   . Diabetes Brother   . Diabetes Brother   . Diabetes Brother   . Diabetes Brother     Past Surgical History:  Procedure Laterality Date  . AMPUTATION Right 07/21/2017   Procedure: RIGHT GREAT TOE AMPUTATION;  Surgeon: 07/23/2017, MD;  Location: [facility] OR;  Service: Orthopedics;  Laterality: Right;  . CARDIAC CATHETERIZATION N/A 05/14/2015   Procedure: Left Heart Cath and Coronary Angiography;  Surgeon: 05/16/2015, MD; dLAD 60%>>95% (small vessel, ?dissection), CFX & RCA systems no sig dz, EF & LVEDP nl      Social History   Occupational History  . Occupation: unemployed  Tobacco Use  . Smoking status: Never Smoker  . Smokeless tobacco: Never Used  Substance and Sexual Activity  . Alcohol use: No  . Drug use: No  . Sexual activity: Yes

## 2019-12-11 MED FILL — ?BASAGLAR 100 UNITS/ML KWPE: 100 | 29 days supply | Qty: 21 | Fill #11

## 2019-12-11 MED FILL — POTASSIUM CHLORIDE ER 10 ME: 10 | 30 days supply | Qty: 30 | Fill #1

## 2019-12-11 MED FILL — GABAPENTIN 600 MG TABLET: 600 | 30 days supply | Qty: 180 | Fill #1

## 2019-12-18 MED FILL — ATORVASTATIN CALCIUM 20 MG: 20 | 30 days supply | Qty: 30 | Fill #1

## 2019-12-19 MED FILL — NOVOLOG FLEXPEN SYRINGE: 100 | 19 days supply | Qty: 9 | Fill #5

## 2019-12-21 ENCOUNTER — Ambulatory Visit: Payer: Self-pay

## 2019-12-21 NOTE — Telephone Encounter (Signed)
Returned call to patient. With interpreter # [phone]. Patient states that she is having back pain low in her tail bone and up to her waste area.  She states it started last night and was severe.  She denies injury.  She tates that she had this pain 2 other times and it was her kidney. She has no fever and denies burning on urination and denies blood in her urine. She states she has taken gabapentin for pain Per protocol patient was told to seek care tonight at nearest UC for her symptoms or if her PCP office is open she should see them. She verbalized understanding and will be see tonight.   Reason for Disposition . [1] SEVERE back pain (e.g., excruciating, unable to do any normal activities) AND [2] not improved 2 hours after pain medicine  Answer Assessment - Initial Assessment Questions 1. ONSET: "When did the pain begin?"      Last night 2. LOCATION: "Where does it hurt?" (upper, mid or lower back)    Tail bone around waste line 3. SEVERITY: "How bad is the pain?"  (e.g., Scale 1-10; mild, moderate, or severe)   - MILD (1-3): doesn't interfere with normal activities    - MODERATE (4-7): interferes with normal activities or awakens from sleep    - SEVERE (8-10): excruciating pain, unable to do any normal activities      7 last night today mild 4. PATTERN: "Is the pain constant?" (e.g., yes, no; constant, intermittent)     yes 5. RADIATION: "Does the pain shoot into your legs or elsewhere?"    Unsure has condition that make lower legs numb 6. CAUSE:  "What do you think is causing the back pain?"      It has been her kidneys in past 7. BACK OVERUSE:  "Any recent lifting of heavy objects, strenuous work or exercise?"     No 8. MEDICATIONS: "What have you taken so far for the pain?" (e.g., nothing, acetaminophen, NSAIDS)     gabapten 9. NEUROLOGIC SYMPTOMS: "Do you have any weakness, numbness, or problems with bowel/bladder control?"     no 10. OTHER SYMPTOMS: "Do you have any other  symptoms?" (e.g., fever, abdominal pain, burning with urination, blood in urine)      No heaadache 11. PREGNANCY: "Is there any chance you are pregnant?" (e.g., yes, no; LMP) no no periods  Protocols used: BACK PAIN-A-AH

## 2019-12-23 ENCOUNTER — Ambulatory Visit: Payer: Self-pay | Attending: Internal Medicine

## 2019-12-23 DIAGNOSIS — Z23 Encounter for immunization: Secondary | ICD-10-CM

## 2019-12-23 NOTE — Progress Notes (Signed)
   Covid-19 Vaccination Clinic  Name:  ISABELL BONAFEDE    MRN: [id number] DOB: 21-Jan-1966  12/23/2019  Ms. Loftus was observed post Covid-19 immunization for 15 minutes without incident. She was provided with Vaccine Information Sheet and instruction to access the V-Safe system.   Ms. Tapia was instructed to call 911 with any severe reactions post vaccine: Marland Kitchen Difficulty breathing  . Swelling of face and throat  . A fast heartbeat  . A bad rash all over body  . Dizziness and weakness   Immunizations Administered    Name Date Dose VIS Date Route   Pfizer COVID-19 Vaccine 12/23/2019  4:09 PM 0.3 mL 09/01/2019 Intramuscular   Manufacturer: [employer], [employer]   Lot: [id number]   NDC: [address]-7

## 2020-01-02 ENCOUNTER — Ambulatory Visit (INDEPENDENT_AMBULATORY_CARE_PROVIDER_SITE_OTHER): Payer: Self-pay | Admitting: Primary Care

## 2020-01-02 MED FILL — HYDROCHLOROTHIAZIDE 12.5 MG: 12.5 | 30 days supply | Qty: 30 | Fill #5

## 2020-01-04 ENCOUNTER — Other Ambulatory Visit: Payer: Self-pay

## 2020-01-04 ENCOUNTER — Encounter: Payer: Self-pay | Admitting: Physician Assistant

## 2020-01-04 ENCOUNTER — Ambulatory Visit (INDEPENDENT_AMBULATORY_CARE_PROVIDER_SITE_OTHER): Payer: Self-pay | Admitting: Physician Assistant

## 2020-01-04 VITALS — Ht 63.0 in | Wt 183.0 lb

## 2020-01-04 DIAGNOSIS — M79671 Pain in right foot: Secondary | ICD-10-CM

## 2020-01-04 NOTE — Progress Notes (Signed)
Office Visit Note   Patient: Rebecca [name]           Date of Birth: Jun 29, 1966           MRN: [id number] Visit Date: 01/04/2020              Requested by: Grayce Sessions, NP [address] [place],  [place] [address] PCP: Grayce Sessions, NP  Chief Complaint  Patient presents with  . Right Foot - Follow-up      HPI: This is a pleasant woman who is in follow-up today for her thickened callus under her second metatarsal head.  She is accompanied by an interpreter.  She has been doing the stretching and she has been using a metatarsal pad which she has found helpful Assessment & Plan: Visit Diagnoses: No diagnosis found.  Plan: She will follow up in 1 month  Follow-Up Instructions: No follow-ups on file.   Ortho Exam  Patient is alert, oriented, no adenopathy, well-dressed, normal affect, normal respiratory effort. Focused examination of her right foot demonstrates no cellulitis no drainage she does have a thickened callus beneath the second metatarsal head.  After obtaining verbal consent this was debrided to a softer skin.  I did give her another metatarsal pad.  She will follow-up in a month  Imaging: No results found. No images are attached to the encounter.  Labs: Lab Results  Component Value Date   HGBA1C 11.0 (A) 10/03/2019   HGBA1C 14.7 (H) 07/21/2019   HGBA1C  07/21/2019     Comment:     code 106- a1c too high to read    ESRSEDRATE 54 (H) 07/18/2017   CRP <0.8 07/18/2017   REPTSTATUS 06/27/2018 FINAL 06/22/2018   REPTSTATUS 06/27/2018 FINAL 06/22/2018   CULT  06/22/2018    NO GROWTH 5 DAYS Performed at [facility], [address] [place], [place] [address]    CULT  06/22/2018    NO GROWTH 5 DAYS Performed at [facility], [address], [place], [place] [address]    LABORGA ESCHERICHIA COLI (A) 06/20/2018     Lab Results  Component Value Date   ALBUMIN 4.5 11/14/2019   ALBUMIN 3.8 07/21/2019   ALBUMIN 4.5  06/22/2019   PREALBUMIN 26.0 07/19/2017    Lab Results  Component Value Date   MG 2.0 11/28/2018   MG 1.8 06/23/2018   MG 2.3 05/13/2015   Lab Results  Component Value Date   VD25OH 28 (L) 01/06/2010   VD25OH 23 (L) 07/02/2009    Lab Results  Component Value Date   PREALBUMIN 26.0 07/19/2017   CBC EXTENDED Latest Ref Rng & Units 11/14/2019 07/21/2019 06/22/2019  WBC 3.4 - 10.8 x10E3/uL 7.5 7.6 8.9  RBC 3.77 - 5.28 x10E6/uL 4.54 4.40 4.51  HGB 11.1 - 15.9 g/dL 12.8 78.6 76.7  HCT 20.9 - 46.6 % 37.8 36.5 38.2  PLT 150 - 450 x10E3/uL 316 239 300  NEUTROABS 1.4 - 7.0 x10E3/uL 4.9 4.5 5.8  LYMPHSABS 0.7 - 3.1 x10E3/uL 1.7 2.3 2.1     Body mass index is 32.42 kg/m.  Orders:  No orders of the defined types were placed in this encounter.  No orders of the defined types were placed in this encounter.    Procedures: No procedures performed  Clinical Data: No additional findings.  ROS:  All other systems negative, except as noted in the HPI. Review of Systems  Objective: Vital Signs: Ht 5\' 3"  (1.6 m)  Wt 183 lb (83 kg)   LMP  (LMP Unknown)   BMI 32.42 kg/m   Specialty Comments:  No specialty comments available.  PMFS History: Patient Active Problem List   Diagnosis Date Noted  . Diarrhea   . Hyperthyroidism   . Insulin dependent diabetes mellitus   . Orthostasis   . Vaginal discharge   . Infectious diarrhea 11/26/2018  . Hyperkalemia 11/26/2018  . Bacteremia 06/21/2018  . Sepsis secondary to UTI ([place]) 06/20/2018  . Great toe amputation status, right 08/05/2017  . Mild renal insufficiency 07/19/2017  . AKI (acute kidney injury) ([place]) 03/04/2016  . Essential hypertension 05/14/2015  . CAD- with distal LAD disease, ? 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 05/14/2015  . NSTEMI (non-ST elevated myocardial infarction) ([place])   . Syncope 05/13/2015  . Hyperglycemia 10/25/2014  . Abrasion of thigh, left 10/25/2014  . DM (diabetes mellitus), type 2, uncontrolled ([place])   . DM  hyperosmolar coma, type 2 ([place]) 05/22/2012  . Hyperosmolar syndrome 05/21/2012   Past Medical History:  Diagnosis Date  . Anemia   . Diabetes mellitus   . Dyslipidemia   . Hypertension   . Non-STEMI (non-ST elevated myocardial infarction) [facility])    Medical therapy for distal LAD dissection  . Thyroid disease     Family History  Problem Relation Age of Onset  . Hypertension Father   . Hyperlipidemia Father   . Cancer Mother   . Diabetes Sister   . Diabetes Brother   . Diabetes Sister   . Diabetes Sister   . Diabetes Sister   . Diabetes Brother   . Diabetes Brother   . Diabetes Brother   . Diabetes Brother     Past Surgical History:  Procedure Laterality Date  . AMPUTATION Right 07/21/2017   Procedure: RIGHT GREAT TOE AMPUTATION;  Surgeon: Newt Minion, MD;  Location: [facility];  Service: Orthopedics;  Laterality: Right;  . CARDIAC CATHETERIZATION N/A 05/14/2015   Procedure: Left Heart Cath and Coronary Angiography;  Surgeon: Leonie Man, MD; dLAD 60%>>95% (small vessel, ?dissection), CFX & RCA systems no sig dz, EF & LVEDP nl      Social History   Occupational History  . Occupation: unemployed  Tobacco Use  . Smoking status: Never Smoker  . Smokeless tobacco: Never Used  Substance and Sexual Activity  . Alcohol use: No  . Drug use: No  . Sexual activity: Yes

## 2020-01-08 MED FILL — NOVOLOG FLEXPEN SYRINGE: 100 | 26 days supply | Qty: 12 | Fill #0

## 2020-01-11 ENCOUNTER — Other Ambulatory Visit: Payer: Self-pay

## 2020-01-11 ENCOUNTER — Encounter (INDEPENDENT_AMBULATORY_CARE_PROVIDER_SITE_OTHER): Payer: Self-pay | Admitting: Primary Care

## 2020-01-11 ENCOUNTER — Other Ambulatory Visit (INDEPENDENT_AMBULATORY_CARE_PROVIDER_SITE_OTHER): Payer: Self-pay | Admitting: Primary Care

## 2020-01-11 ENCOUNTER — Ambulatory Visit (INDEPENDENT_AMBULATORY_CARE_PROVIDER_SITE_OTHER): Payer: Self-pay | Admitting: Primary Care

## 2020-01-11 VITALS — BP 166/105 | HR 95 | Temp 97.3°F | Ht 63.0 in | Wt 185.2 lb

## 2020-01-11 DIAGNOSIS — Z794 Long term (current) use of insulin: Secondary | ICD-10-CM

## 2020-01-11 DIAGNOSIS — E785 Hyperlipidemia, unspecified: Secondary | ICD-10-CM

## 2020-01-11 DIAGNOSIS — Z760 Encounter for issue of repeat prescription: Secondary | ICD-10-CM

## 2020-01-11 DIAGNOSIS — E1165 Type 2 diabetes mellitus with hyperglycemia: Secondary | ICD-10-CM

## 2020-01-11 DIAGNOSIS — E119 Type 2 diabetes mellitus without complications: Secondary | ICD-10-CM

## 2020-01-11 DIAGNOSIS — E7841 Elevated Lipoprotein(a): Secondary | ICD-10-CM

## 2020-01-11 DIAGNOSIS — I10 Essential (primary) hypertension: Secondary | ICD-10-CM

## 2020-01-11 DIAGNOSIS — E1169 Type 2 diabetes mellitus with other specified complication: Secondary | ICD-10-CM

## 2020-01-11 LAB — GLUCOSE, POCT (MANUAL RESULT ENTRY): POC Glucose: 108 mg/dl — AB (ref 70–99)

## 2020-01-11 LAB — POCT GLYCOSYLATED HEMOGLOBIN (HGB A1C): Hemoglobin A1C: 9.3 % — AB (ref 4.0–5.6)

## 2020-01-11 MED ORDER — POLYETHYLENE GLYCOL 3350 17 G PO PACK: 17.0000 g | PACK | Freq: Every day | ORAL | 3 refills | Status: DC

## 2020-01-11 MED ORDER — HYDROCHLOROTHIAZIDE 25 MG PO TABS: 25.0000 mg | ORAL_TABLET | Freq: Every day | ORAL | 1 refills | Status: DC

## 2020-01-11 MED ORDER — ATORVASTATIN CALCIUM 40 MG PO TABS: 40.0000 mg | ORAL_TABLET | Freq: Every day | ORAL | 1 refills | Status: DC

## 2020-01-11 MED ORDER — LOSARTAN POTASSIUM 100 MG PO TABS: 100.0000 mg | ORAL_TABLET | Freq: Every day | ORAL | 1 refills | Status: DC

## 2020-01-11 MED ORDER — SITAGLIPTIN PHOSPHATE 100 MG PO TABS: 100.0000 mg | ORAL_TABLET | Freq: Every day | ORAL | 1 refills | Status: DC

## 2020-01-11 MED FILL — LOSARTAN POTASSIUM 100 MG T: 100 | 30 days supply | Qty: 30 | Fill #0

## 2020-01-11 MED FILL — ?ATORVASTATIN 40MG TABLET: 40 | 30 days supply | Qty: 30 | Fill #0

## 2020-01-11 NOTE — Progress Notes (Signed)
Rebecca [name] is a 54 y.o. female who presents for an follow up evaluation of Type 2 diabetes mellitus.  Current symptoms/problems include polydipsia and polyuria and have been improving. Symptoms have been present for 3 months.  Current diabetic medications include oral agents (dual therapy): Glucophage XR. And on previous visit increased Januvia from 50mg  to 100mg  dailyu.  The patient was initially diagnosed with Type 2 diabetes mellitus based on the following criteria:  A1C today 9.3 3 months ago A1C 11. Improving   Current monitoring regimen: home blood tests - 3 times daily Home blood sugar records: fasting range: 14 day average 224 ( reading 94-221) Any episodes of hypoglycemia? {no  Known diabetic complications: nephropathy Cardiovascular risk factors: diabetes mellitus, dyslipidemia, hypertension and obesity (BMI >= 30 kg/m2) Eye exam current (within one year): no Weight trend: stable Prior visit with CDE: no Current diet: on average, 3 meals per day Current exercise: none Medication Compliance?  No started trying   Is She on angiotensin II receptor blocker?  Yes  losartan (Cozaar)    Review of Systems  Gastrointestinal: Positive for constipation.       Polydipsia  Genitourinary: Positive for frequency.  Neurological: Positive for headaches.       She brought a medication to clean out her system it gave her a head ache and diarrhea. She is nolonger taking it nor does she know the name.  All other systems reviewed and are negative.   Objective:    BP (!) 170/107 (BP Location: Right Arm, Patient Position: Sitting, Cuff Size: Normal)   Pulse 93   Temp (!) 97.3 F (36.3 C) (Temporal)   Ht 5\' 3"  (1.6 m)   Wt 185 lb 3.2 oz (84 kg)   LMP  (LMP Unknown)   SpO2 99%   BMI 32.81 kg/m   Physical Exam Vitals reviewed.  Constitutional:      Appearance: She is obese.  HENT:     Head: Normocephalic.  Cardiovascular:     Rate and Rhythm: Normal rate and  regular rhythm.  Pulmonary:     Effort: Pulmonary effort is normal.     Breath sounds: Normal breath sounds.  Abdominal:     General: Bowel sounds are normal.  Musculoskeletal:        General: Normal range of motion.     Cervical back: Normal range of motion and neck supple.  Skin:    General: Skin is warm and dry.  Neurological:     Mental Status: She is alert and oriented to person, place, and time.  Psychiatric:        Mood and Affect: Mood normal.        Behavior: Behavior normal.        Thought Content: Thought content normal.        Judgment: Judgment normal.      Lab Review Glucose (mg/dL)  Date Value  391 (H)  06/22/2019 321 (H)  12/13/2018 340 (H)   Glucose, Bld (mg/dL)  Date Value  44/09/270 281 (H)  02/08/2019 481 (H)  11/28/2018 182 (H)   CO2 (mmol/L)  Date Value  11/14/2019 25  07/21/2019 26  06/22/2019 25   BUN (mg/dL)  Date Value  11/16/2019 30 (H)  07/21/2019 29 (H)  06/22/2019 44 (H)  02/08/2019 46 (H)  12/13/2018 27 (H)   Creat (mg/dL)  Date Value  08/22/2019 1.13 (H)   Creatinine, Ser (mg/dL)  Date Value  02/10/2019 1.39 (H)  07/21/2019  1.16 (H)  06/22/2019 1.48 (H)      Assessment:   Medication refill -     losartan (COZAAR) 100 MG tablet; Take 1 tablet (100 mg total) by mouth daily.  Hypertension, unspecified type Remains elevated but she did not take it this morning . No changes on follow she will bring all her medication and we will color code the bottles and fix a pill box. She is aware of Blood goal 130/80 Continue all antihypertensives as prescribed.  Remember to bring in your medications at  follow up appointment.  DASH/Mediterranean Diets are healthier choices for HTN.  Continuing losartan 100mg ,and  HCTZ 25mg , increased from 12.5 daily. Patient only had 12.5mg  bottle with her.  -     losartan (COZAAR) 100 MG tablet; Take 1 tablet (100 mg total) by mouth daily. (she has never picked medication from  pharmacy)  Hyperlipidemia associated with type 2 diabetes mellitus ([place]) Elevated cholesterol panel increased atovarsatin from 20mg  to 40mg  sent new prescription to pharmacy. Decrease your fatty foods, red meat, cheese, milk and increase fiber like whole grains and veggies.   Other orders -     hydrochlorothiazide (HYDRODIURIL) 25 MG tablet; Take 1 tablet (25 mg total) by mouth daily.   1.  Rx changes: none  2.  Education: Reviewed 'ABCs' of diabetes management (respective goals in parentheses):  A1C (<7), blood pressure (<130/80), and cholesterol (LDL <100). 3. Discussed pathophysiology of DM;  4. CHO counting diet discussed.  Reviewed CHO amount in various foods and how to read nutrition labels.  Discussed recommended serving sizes.  5.  Recommend check BG 3  times a day 6.  Recommended increase physical activity - goal is 150 minutes per week 7. Follow up: has a schedule appointment

## 2020-01-11 NOTE — Patient Instructions (Signed)
Cmo controlar su hipertensin Managing Your Hypertension La hipertensin se denomina usualmente presin arterial alta. Ocurre cuando la sangre presiona contra las paredes de las arterias con demasiada fuerza. Las arterias son los vasos sanguneos que transportan la sangre desde el corazn hacia todas las partes del cuerpo. La hipertensin hace que el corazn haga ms esfuerzo para Chiropodist y [employer] que las arterias se Teacher, music o Advertising account executive. La hipertensin no tratada o no controlada puede causar infarto de miocardio, accidente cerebrovascular, enfermedad renal y otros problemas. Qu son las Futures trader de presin arterial? Una lectura de la presin arterial consiste de un nmero ms alto sobre un nmero ms bajo. En condiciones ideales, la presin arterial debe estar por debajo de 120/80. El primer nmero ("superior") es la presin sistlica. Es la medida de la presin de las arterias cuando el corazn late. El segundo nmero ("inferior") es la presin diastlica. Es la medida de la presin en las arterias cuando el corazn se relaja. Qu significa mi lectura de presin arterial? La presin arterial se clasifica en cuatro etapas. Sobre la base de la lectura de su presin arterial, el mdico puede usar las siguientes etapas para determinar si necesita tratamiento y de qu tipo. La presin sistlica y la presin diastlica se miden en una unidad llamada mm Hg. Normal  Presin sistlica: por debajo de 409.  Presin diastlica: por debajo de 80. Elevada  Presin sistlica: 811-914.  Presin diastlica: por debajo de 80. Etapa 1 de hipertensin  Presin sistlica: 782-956.  Presin diastlica: 21-30. Etapa 2 de hipertensin  Presin sistlica: 865 o ms.  Presin diastlica: 90 o ms. Cules son los riesgos para la salud asociados con la hipertensin? Controlar la hipertensin es una responsabilidad importante. La hipertensin no controlada puede causar:  Infarto de  miocardio.  Accidente cerebrovascular.  Debilitamiento de los vasos sanguneos (aneurisma).  Insuficiencia cardaca.  Dao renal.  Dao ocular.  Sndrome metablico.  Problemas de memoria y concentracin. Qu cambios puedo hacer para controlar mi hipertensin? La hipertensin se puede controlar haciendo [employer] Corporation estilo de vida y, posiblemente, tomando medicamentos. Su mdico le ayudar a crear un plan para bajar la presin arterial al rango normal. Comida y bebida   Siga una dieta con alto contenido de fibras y [place], y con bajo contenido de sal (sodio), azcar agregada y Daphene Jaeger. Un ejemplo de plan alimenticio es la dieta DASH (Dietary Approaches to Stop Hypertension, Mtodos alimenticios para detener la hipertensin). Para alimentarse de esta manera: ? Coma mucha fruta y [place]. Trate de que la mitad del plato de cada comida sea de frutas y verduras. ? Coma cereales integrales, como pasta integral, arroz integral y pan integral. Llene aproximadamente un cuarto del plato con cereales integrales. ? Consuma productos lcteos con bajo contenido de grasa. ? Evite la ingesta de cortes de carne grasa, carne procesada o curada, y carne de ave con piel. Llene aproximadamente un cuarto del plato con protenas magras, como pescado, pollo sin piel, frijoles, huevos y tofu. ? Evite ingerir alimentos prehechos o procesados. En general, estos tienen mayor cantidad de sodio, azcar agregada y Wendee Copp.  Reduzca su ingesta diaria de sodio. La mayora de las personas que tienen hipertensin deben comer menos de 1500 mg de sodio por [employer].  Limite el consumo de alcohol a no ms de 1 medida por da si es mujer y no est Music therapist y a 2 medidas por da si es hombre. Una medida equivale a 12onzas de cerveza, Public house manager de vino  o 1onzas de bebidas alcohlicas de alta graduacin. Estilo de vida  Trabaje con su mdico para mantener un peso saludable o perder peso. Pregntele cual es su peso  recomendado.  Realice al menos 30 minutos de ejercicio que haga que se acelere su corazn (ejercicio aerbico) la mayora de los das de la semana. Estas actividades pueden incluir caminar, nadar o andar en bicicleta.  Incluya ejercicios para fortalecer sus msculos (ejercicios de resistencia), como levantamiento de pesas, como parte de su rutina semanal de ejercicios. Intente realizar 30minutos de este tipo de ejercicios al menos tres das a la semana.  No consuma ningn producto que contenga nicotina o tabaco, como cigarrillos y cigarrillos electrnicos. Si necesita ayuda para dejar de fumar, consulte al mdico.  Controle las enfermedades a largo plazo (crnicas), como el colesterol alto o la diabetes. Control  Contrlese la presin arterial en su casa segn las indicaciones del mdico. La presin arterial deseada puede variar en funcin de las enfermedades, la edad y otros factores personales.  Contrlese la presin arterial de manera regular, en la frecuencia indicada por su mdico. Trabaje con su mdico  Revise con su mdico todos los medicamentos que toma ya que puede haber efectos secundarios o interacciones.  Hable con su mdico acerca de la dieta, hbitos de ejercicio y otros factores del estilo de vida que pueden contribuir a la hipertensin.  Consulte a su mdico regularmente. Su mdico puede ayudarle a crear y ajustar su plan para controlar la hipertensin. Debo tomar un medicamento para controlar mi presin arterial? El mdico puede recetarle medicamentos si los cambios en el estilo de vida no son suficientes para lograr controlar la presin arterial y si:  Su presin arterial sistlica es de 130 o ms.  Su presin arterial diastlica es de 80 o ms. Tome los medicamentos solamente como se lo haya indicado el mdico. Siga cuidadosamente las indicaciones. Los medicamentos para la presin arterial deben tomarse segn las indicaciones. Los medicamentos pierden eficacia al  omitir las dosis. El hecho de omitir las dosis tambin aumenta el riesgo de otros problemas. Comunquese con un mdico si:  Piensa que tiene una reaccin alrgica a los medicamentos que ha tomado.  Tiene dolores de cabeza frecuentes (recurrentes).  Siente mareos.  Tiene hinchazn en los tobillos.  Tiene problemas de visin. Solicite ayuda de inmediato si:  Siente un dolor de cabeza intenso o confusin.  Siente debilidad inusual, adormecimiento o que se desmayar.  Siente un dolor intenso en el pecho o el abdomen.  Vomita repetidas veces.  Tiene dificultad para respirar. Resumen  La hipertensin se produce cuando la sangre bombea en las arterias con mucha fuerza. Si esta afeccin no se controla, podra correr riesgo de tener complicaciones graves.  La presin arterial deseada puede variar en funcin de las enfermedades, la edad y otros factores personales. Para la mayora de las personas, una presin arterial normal es menor que 120/80.  La hipertensin se puede controlar mediante cambios en el estilo de vida, tomando medicamentos, o ambas cosas. Los cambios en el estilo de vida incluyen prdida de peso, ingerir alimentos sanos, seguir una dieta baja en sodio, hacer ms ejercicio y limitar el consumo de alcohol. Esta informacin no tiene como fin reemplazar el consejo del mdico. Asegrese de hacerle al mdico cualquier pregunta que tenga. Document Revised: 11/16/2016 Document Reviewed: 08/19/2016 Elsevier Patient Education  2020 Elsevier Inc.  

## 2020-01-16 ENCOUNTER — Other Ambulatory Visit: Payer: Self-pay

## 2020-01-16 ENCOUNTER — Ambulatory Visit (INDEPENDENT_AMBULATORY_CARE_PROVIDER_SITE_OTHER): Payer: Self-pay | Admitting: Primary Care

## 2020-01-16 ENCOUNTER — Encounter (INDEPENDENT_AMBULATORY_CARE_PROVIDER_SITE_OTHER): Payer: Self-pay | Admitting: Primary Care

## 2020-01-16 ENCOUNTER — Other Ambulatory Visit (HOSPITAL_COMMUNITY)
Admission: RE | Admit: 2020-01-16 | Discharge: 2020-01-16 | Disposition: A | Discharge status: Home / Self Care | Payer: Self-pay | Source: Ambulatory Visit | Attending: Primary Care | Admitting: Primary Care

## 2020-01-16 ENCOUNTER — Ambulatory Visit: Payer: Self-pay | Attending: Internal Medicine

## 2020-01-16 VITALS — BP 143/85 | HR 87 | Temp 98.6°F | Ht 63.0 in | Wt 186.0 lb

## 2020-01-16 DIAGNOSIS — I10 Essential (primary) hypertension: Secondary | ICD-10-CM

## 2020-01-16 DIAGNOSIS — Z1239 Encounter for other screening for malignant neoplasm of breast: Secondary | ICD-10-CM

## 2020-01-16 DIAGNOSIS — Z124 Encounter for screening for malignant neoplasm of cervix: Secondary | ICD-10-CM | POA: Insufficient documentation

## 2020-01-16 DIAGNOSIS — Z113 Encounter for screening for infections with a predominantly sexual mode of transmission: Secondary | ICD-10-CM

## 2020-01-16 DIAGNOSIS — Z01419 Encounter for gynecological examination (general) (routine) without abnormal findings: Secondary | ICD-10-CM

## 2020-01-16 DIAGNOSIS — Z23 Encounter for immunization: Secondary | ICD-10-CM

## 2020-01-16 DIAGNOSIS — E1165 Type 2 diabetes mellitus with hyperglycemia: Secondary | ICD-10-CM

## 2020-01-16 DIAGNOSIS — E119 Type 2 diabetes mellitus without complications: Secondary | ICD-10-CM

## 2020-01-16 LAB — POCT CBG (FASTING - GLUCOSE)-MANUAL ENTRY: Glucose Fasting, POC: 150 mg/dL — AB (ref 70–99)

## 2020-01-16 NOTE — Patient Instructions (Signed)
Prolapso de los rganos de la pelvis Pelvic Organ Prolapse Un prolapso de los rganos de la pelvis es el estiramiento, la dilatacin o la cada de los rganos de la pelvis a una posicin anormal. Esto sucede cuando los msculos y tejidos que rodean y sostienen las estructuras plvicas se debilitan o estiran. El prolapso de los rganos de la pelvis puede involucrar a los siguientes rganos:  Vagina (prolapso vaginal).  tero (prolapso uterino).  Vejiga (cistocele).  Recto (rectocele).  Intestinos (enterocele). Cuando estn comprometidos rganos diferentes a la vagina, estos a menudo se protruyen hacia adentro de la vagina o hacia afuera de la vagina, segn la gravedad del prolapso. Cules son las causas? Esta afeccin puede ser causada por lo siguiente:  Embarazo, trabajo de parto y parto.  Ciruga de pelvis anterior.  Una disminucin de la produccin de estrgeno debido a la menopausia.  Levantar sistemticamente objetos que pesan ms de 50libras (23kg).  Obesidad.  Problemas para defecar a largo plazo (estreimiento crnico).  Una tos que dura mucho tiempo (crnica).  Acumulacin de lquido en el abdomen debido a ciertas enfermedades y afecciones. Cules son los signos o los sntomas? Los sntomas de esta afeccin incluyen los siguientes:  Escape de orina (prdida del control de la vejiga) al toser, estornudar, esforzarse y ejercitar (incontinencia urinaria de esfuerzo). Esto puede empeorar inmediatamente despus del parto. Puede mejorar gradualmente con el tiempo.  Sensacin de presin en la pelvis o la vagina. Esta presin puede aumentar al toser o al defecar.  Un bulto que sobresale de la abertura de la vagina.  Dificultad para orinar o defecar.  Dolor en la parte inferior de la espalda.  Dolor, molestias o desinters en tener sexo.  Infecciones reiteradas en la vejiga (infecciones de las vas urinarias).  Dificultad para insertar un tampn. En algunas  personas, esta afeccin no causa sntomas. Cmo se diagnostica? Esta afeccin puede diagnosticarse con un examen de la vagina y un examen rectal. Durante el examen, pueden pedirle que tosa y que haga un esfuerzo mientras est acostada, sentada y de pie. El mdico determinar si se requieren otros estudios, como las pruebas de la funcin de la vejiga. Cmo se trata? El tratamiento de esta afeccin puede depender de los sntomas. El tratamiento puede incluir lo siguiente:  Cambios en el estilo de vida, como modificar su dieta.  Vaciar la vejiga en horarios programados (terapia de entrenamiento de la vejiga). Esto puede ayudar a reducir o evitar la incontinencia urinaria.  Estrgeno. Si el prolapso es leve, el estrgeno puede ayudar al aumentar la fuerza y el tono de los msculos del piso plvico.  Ejercicios de Kegel. Estos ejercicios pueden ayudar en los casos leves de prolapso al estirar y tensar los msculos del piso plvico.  Un dispositivo flexible que ayuda a sostener las paredes vaginales y mantener los rganos de la pelvis en su lugar (pesario). El mdico insertar este dispositivo en la vagina.  Ciruga. Esta es a menudo la nica forma de tratamiento de los casos graves de prolapso. Siga estas indicaciones en su casa:  Evite ingerir bebidas que contengan cafena o alcohol.  Aumente el consumo de alimentos ricos en fibra. Esto puede ayudar a disminuir el estreimiento y a evitar realizar esfuerzos durante las deposiciones.  Pierda peso si se lo recomienda el mdico.  Use una toalla higinica o paales para adultos si tiene incontinencia urinaria.  Evite levantar objetos pesados y realizar esfuerzos mientras se ejercita o trabaja. No contenga la respiracin cuando levante pesas   y realice ejercicios de intensidad leve a moderada. Limite sus actividades como se lo haya indicado el mdico.  Realice los ejercicios de Kegel como se lo haya indicado el mdico. Haga lo  siguiente: ? Contraiga con fuerza los msculos del suelo plvico. Debe sentir que se eleva y comprime el rea rectal y tensin en el rea vaginal. Mantenga el estmago, las nalgas y las piernas relajadas. ? Mantenga los msculos tensos durante 10segundos como mximo. ? Relaje los msculos. ? Repita este ejercicio 50veces al da o las veces que le haya indicado su mdico. Contine haciendo este ejercicio durante al menos 4 a 6semanas o el tiempo que le haya indicado su mdico.  Tome los medicamentos de venta libre y los recetados solamente como se lo haya indicado el mdico.  Si tiene puesto un pesario, cudelo como se lo haya indicado su mdico.  Concurra a todas las visitas de seguimiento como se lo haya indicado el mdico. Esto es importante. Comunquese con un mdico si:  Tiene sntomas que interfieren con sus actividades diarias o su vida sexual.  Necesita medicamentos para aliviar las molestias.  Observa un sangrado proveniente de la vagina no relacionado con su menstruacin.  Tiene fiebre.  Siente dolor o sangra al orinar.  Sangra al defecar.  Pierde orina durante el sexo.  Tiene estreimiento crnico.  Su pesario se sale de lugar.  Tiene secrecin vaginal con mal olor.  Tiene un dolor bajo e inusual en el abdomen. Resumen  Un prolapso de los rganos de la pelvis es el estiramiento, la dilatacin o la cada de los rganos de la pelvis a una posicin anormal. Esto sucede cuando los msculos y tejidos que rodean y sostienen las estructuras plvicas se debilitan o estiran.  Cuando estn comprometidos rganos diferentes a la vagina, estos a menudo se protruyen hacia adentro de la vagina o hacia afuera de la vagina, segn la gravedad del prolapso.  En la mayora de los casos, esta afeccin debe tratarse solo si produce sntomas. El tratamiento puede incluir cambios en el estilo de vida, estrgeno, ejercicios de Kegel, la insercin de un pesario o ciruga.  Evite levantar  objetos pesados y realizar esfuerzos mientras se ejercita o trabaja. No contenga la respiracin cuando levante pesas y realice ejercicios de intensidad leve a moderada. Limite sus actividades como se lo haya indicado el mdico. Esta informacin no tiene como fin reemplazar el consejo del mdico. Asegrese de hacerle al mdico cualquier pregunta que tenga. Document Revised: 11/18/2017 Document Reviewed: 11/18/2017 Elsevier Patient Education  2020 Elsevier Inc.  

## 2020-01-16 NOTE — Progress Notes (Signed)
Established Patient Office Visit  Subjective:  Patient ID: Rebecca [name], female    DOB: 07-07-1966  Age: 54 y.o. MRN: [id number]  CC:  Chief Complaint  Patient presents with  . Gynecologic Exam    HPI [name] presents for gynecologic exam.Also will perform diabetic foot exam   Past Medical History:  Diagnosis Date  . Anemia   . Diabetes mellitus   . Dyslipidemia   . Hypertension   . Non-STEMI (non-ST elevated myocardial infarction) [facility])    Medical therapy for distal LAD dissection  . Thyroid disease     Past Surgical History:  Procedure Laterality Date  . AMPUTATION Right 07/21/2017   Procedure: RIGHT GREAT TOE AMPUTATION;  Surgeon: Nadara Mustard, MD;  Location: [facility] OR;  Service: Orthopedics;  Laterality: Right;  . CARDIAC CATHETERIZATION N/A 05/14/2015   Procedure: Left Heart Cath and Coronary Angiography;  Surgeon: Marykay Lex, MD; dLAD 60%>>95% (small vessel, ?dissection), CFX & RCA systems no sig dz, EF & LVEDP nl       Family History  Problem Relation Age of Onset  . Hypertension Father   . Hyperlipidemia Father   . Cancer Mother   . Diabetes Sister   . Diabetes Brother   . Diabetes Sister   . Diabetes Sister   . Diabetes Sister   . Diabetes Brother   . Diabetes Brother   . Diabetes Brother   . Diabetes Brother     Social History   Socioeconomic History  . Marital status: Single    Spouse name: Not on file  . Number of children: Not on file  . Years of education: Not on file  . Highest education level: Not on file  Occupational History  . Occupation: unemployed  Tobacco Use  . Smoking status: Never Smoker  . Smokeless tobacco: Never Used  Substance and Sexual Activity  . Alcohol use: No  . Drug use: No  . Sexual activity: Yes  Other Topics Concern  . Not on file  Social History Narrative  . Not on file   Social Determinants of Health   Financial Resource Strain:   . Difficulty of Paying Living Expenses:    Food Insecurity:   . Worried About Programme researcher, broadcasting/film/video in the Last Year:   . Barista in the Last Year:   Transportation Needs:   . Freight forwarder (Medical):   Marland Kitchen Lack of Transportation (Non-Medical):   Physical Activity:   . Days of Exercise per Week:   . Minutes of Exercise per Session:   Stress:   . Feeling of Stress :   Social Connections:   . Frequency of Communication with Friends and Family:   . Frequency of Social Gatherings with Friends and Family:   . Attends Religious Services:   . Active Member of Clubs or Organizations:   . Attends Banker Meetings:   Marland Kitchen Marital Status:   Intimate Partner Violence:   . Fear of Current or Ex-Partner:   . Emotionally Abused:   Marland Kitchen Physically Abused:   . Sexually Abused:     Outpatient Medications Prior to Visit  Medication Sig Dispense Refill  . aspirin 81 MG EC tablet Take 1 tablet (81 mg total) by mouth daily. 90 tablet 1  . atorvastatin (LIPITOR) 40 MG tablet Take 1 tablet (40 mg total) by mouth at bedtime. 90 tablet 1  . gabapentin (NEURONTIN) 600 MG tablet TAKE 2 TABLETS BY MOUTH 3 TIMES  DAILY. 180 tablet 1  . hydrochlorothiazide (HYDRODIURIL) 25 MG tablet Take 1 tablet (25 mg total) by mouth daily. 90 tablet 1  . insulin aspart (NOVOLOG) 100 UNIT/ML injection Inject 15 Units into the skin 3 (three) times daily before meals. 15 mL 3  . Insulin Pen Needle (PEN NEEDLES) 31G X 8 MM MISC Inject 1 each into the skin at bedtime. 30 each 5  . losartan (COZAAR) 100 MG tablet Take 1 tablet (100 mg total) by mouth daily. 90 tablet 1  . metFORMIN (GLUCOPHAGE XR) 500 MG 24 hr tablet Take 2 tablets (1,000 mg total) by mouth 2 (two) times daily after a meal. 120 tablet 1  . methimazole (TAPAZOLE) 5 MG tablet Take 1 tablet (5 mg total) by mouth every Monday, Wednesday, and Friday. 90 tablet 3  . mupirocin ointment (BACTROBAN) 2 % Apply 1 application topically daily. 22 g 6  . nitroGLYCERIN (NITROSTAT) 0.4 MG SL tablet  Place 1 tablet (0.4 mg total) under the tongue every 5 (five) minutes as needed for chest pain. 25 tablet 3  . polyethylene glycol (MIRALAX / GLYCOLAX) 17 g packet Take 17 g by mouth daily. 14 each 3  . potassium chloride SA (KLOR-CON) 10 MEQ tablet Take 1 tablet (10 mEq total) by mouth daily. 90 tablet 1  . sitaGLIPtin (JANUVIA) 100 MG tablet Take 1 tablet (100 mg total) by mouth daily. 90 tablet 1  . Insulin Glargine (LANTUS SOLOSTAR) 100 UNIT/ML Solostar Pen Inject 34 Units into the skin 2 (two) times daily. 5 pen 6  . doxycycline (VIBRA-TABS) 100 MG tablet Take 1 tablet (100 mg total) by mouth 2 (two) times daily. 28 tablet 0   No facility-administered medications prior to visit.    No Known Allergies  ROS Review of Systems  All other systems reviewed and are negative.     Objective:    Physical Exam CONSTITUTIONAL: Well-developed, well-nourished female in no acute distress.  HENT:  Normocephalic, atraumatic, External right and left ear normal. Oropharynx is clear and moist EYES: Conjunctivae and EOM are normal. Pupils are equal, round, and reactive to light. No scleral icterus.  NECK: Normal range of motion, supple, no masses.  Normal thyroid.  SKIN: Skin is warm and dry. No rash noted. Not diaphoretic. No erythema. No pallor. [place]: Alert and oriented to person, place, and time. Normal reflexes, muscle tone coordination. No cranial nerve deficit noted. PSYCHIATRIC: Normal mood and affect. Normal behavior. Normal judgment and thought content. CARDIOVASCULAR: Normal heart rate noted, regular rhythm RESPIRATORY: Clear to auscultation bilaterally. Effort and breath sounds normal, no problems with respiration noted. BREASTS:Taught SBE . Exam symmetrical no lumps or mass palpated ABDOMEN: Soft, normal bowel sounds, no distention noted.  No tenderness, rebound or guarding.  PELVIC: Normal appearing external genitalia; normal appearing vaginal mucosa and cervix.  No abnormal  discharge noted.  Pap smear obtained.  Prolapse  uterine, no other palpable masses, no uterine or adnexal tenderness. MUSCULOSKELETAL: Normal range of motion. No tenderness.  No cyanosis, clubbing, or edema.  2+ distal pulses. BP (!) 143/85 (BP Location: Right Arm, Patient Position: Sitting, Cuff Size: Normal)   Pulse 87   Temp 98.6 F (37 C) (Temporal)   Ht 5\' 3"  (1.6 m)   Wt 186 lb (84.4 kg)   LMP  (LMP Unknown)   SpO2 97%   BMI 32.95 kg/m  Wt Readings from Last 3 Encounters:  01/16/20 186 lb (84.4 kg)  01/11/20 185 lb 3.2 oz (84 kg)  01/04/20  183 lb (83 kg)     Health Maintenance Due  Topic Date Due  . OPHTHALMOLOGY EXAM  Never done  . Fecal DNA (Cologuard)  Never done  . PAP SMEAR-Modifier  08/06/2018  . FOOT EXAM  12/13/2019  . COVID-19 Vaccine (2 - Pfizer 2-dose series) 01/13/2020    There are no preventive care reminders to display for this patient.  Lab Results  Component Value Date   TSH 1.830 06/22/2019   Lab Results  Component Value Date   WBC 7.5 11/14/2019   HGB 13.0 11/14/2019   HCT 37.8 11/14/2019   MCV 83 11/14/2019   PLT 316 11/14/2019   Lab Results  Component Value Date   NA 139 11/14/2019   K 5.5 (H) 11/14/2019   CO2 25 11/14/2019   GLUCOSE 391 (H) 11/14/2019   BUN 30 (H) 11/14/2019   CREATININE 1.39 (H) 11/14/2019   BILITOT 0.4 11/14/2019   ALKPHOS 259 (H) 11/14/2019   AST 105 (H) 11/14/2019   ALT 192 (H) 11/14/2019   PROT 7.8 11/14/2019   ALBUMIN 4.5 11/14/2019   CALCIUM 9.9 11/14/2019   ANIONGAP 13 07/21/2019   Lab Results  Component Value Date   CHOL 232 (H) 11/14/2019   Lab Results  Component Value Date   HDL 46 11/14/2019   Lab Results  Component Value Date   LDLCALC 151 (H) 11/14/2019   Lab Results  Component Value Date   TRIG 191 (H) 11/14/2019   Lab Results  Component Value Date   CHOLHDL 5.0 (H) 11/14/2019   Lab Results  Component Value Date   HGBA1C 9.3 (A) 01/11/2020      Assessment & Plan:  Briah  was seen today for gynecologic exam.  Diagnoses and all orders for this visit:  Uncontrolled type 2 diabetes mellitus with hyperglycemia (HCC) Patient is trying with medication compliance and diet today fasting 150 which is not normal but a vast improvement . Each visit continue to praise all positive efforts.  -     Glucose (CBG), Fasting 150 -     Ambulatory referral to Ophthalmology  Encounter for diabetic foot exam [facility]) Completed - see screening documentation   Cervical cancer screening -     Cytology - PAP(Levan)  Screening for STD (sexually transmitted disease) -     Cervicovaginal ancillary only  Hypertension, unspecified type We have discussed target BP range and blood pressure goal 130/80. We discussed the importance of compliance with medical therapy and DASH diet recommended, consequences of uncontrolled hypertension discussed.  - continue current BP medications   No orders of the defined types were placed in this encounter.   Follow-up: keep schedule appointment bring medication    Grayce Sessions, NP

## 2020-01-16 NOTE — Progress Notes (Signed)
   Covid-19 Vaccination Clinic  Name:  Rebecca [name]    MRN: [id number] DOB: [birth year]  01/16/2020  [name] was observed post Covid-19 immunization for 15 minutes without incident. She was provided with Vaccine Information Sheet and instruction to access the V-Safe system.   [name] was instructed to call 911 with any severe reactions post vaccine: Marland Kitchen Difficulty breathing  . Swelling of face and throat  . A fast heartbeat  . A bad rash all over body  . Dizziness and weakness   Immunizations Administered    Name Date Dose VIS Date Route   Pfizer COVID-19 Vaccine 01/16/2020  4:01 PM 0.3 mL 11/15/2018 Intramuscular   Manufacturer: [employer], [employer]   Lot: [id number]   NDC: [address]-3

## 2020-01-17 LAB — CERVICOVAGINAL ANCILLARY ONLY
Bacterial Vaginitis (gardnerella): NEGATIVE
Chlamydia: NEGATIVE
Comment: NEGATIVE
Comment: NEGATIVE
Comment: NEGATIVE
Comment: NORMAL
Neisseria Gonorrhea: NEGATIVE
Trichomonas: NEGATIVE

## 2020-01-17 MED FILL — ?POTASSIUM CL ER 10 MEQ TAB: 10 MEQ | 30 days supply | Qty: 30 | Fill #2

## 2020-01-17 MED FILL — methIMAzole 5 MG TABS: 5 | 30 days supply | Qty: 12 | Fill #0

## 2020-01-17 MED FILL — GABAPENTIN 600 MG TABLET: 600 | 30 days supply | Qty: 180 | Fill #0

## 2020-01-18 ENCOUNTER — Telehealth (INDEPENDENT_AMBULATORY_CARE_PROVIDER_SITE_OTHER): Payer: Self-pay

## 2020-01-18 NOTE — Telephone Encounter (Signed)
Call placed to patient using pacific interpreter [phone]) patient was unavailable. Her daughter Francesco Runner was informed(per DPR) that pap is normal repeat in three years. She will relay results to patient. Maryjean Morn, CMA

## 2020-01-19 LAB — CYTOLOGY - PAP: Diagnosis: REACTIVE

## 2020-01-30 ENCOUNTER — Encounter (INDEPENDENT_AMBULATORY_CARE_PROVIDER_SITE_OTHER): Payer: Self-pay | Admitting: Primary Care

## 2020-01-30 ENCOUNTER — Other Ambulatory Visit: Payer: Self-pay

## 2020-01-30 ENCOUNTER — Ambulatory Visit (INDEPENDENT_AMBULATORY_CARE_PROVIDER_SITE_OTHER): Payer: Self-pay | Admitting: Primary Care

## 2020-01-30 VITALS — BP 138/76 | HR 94 | Temp 97.2°F | Resp 16 | Ht 63.0 in | Wt 188.0 lb

## 2020-01-30 DIAGNOSIS — I10 Essential (primary) hypertension: Secondary | ICD-10-CM

## 2020-01-30 DIAGNOSIS — Z7189 Other specified counseling: Secondary | ICD-10-CM

## 2020-01-30 DIAGNOSIS — E1165 Type 2 diabetes mellitus with hyperglycemia: Secondary | ICD-10-CM

## 2020-01-30 DIAGNOSIS — E0590 Thyrotoxicosis, unspecified without thyrotoxic crisis or storm: Secondary | ICD-10-CM

## 2020-01-30 LAB — GLUCOSE, POCT (MANUAL RESULT ENTRY): POC Glucose: 282 mg/dl — AB (ref 70–99)

## 2020-01-30 NOTE — Progress Notes (Signed)
MEDS reconciliation  Ckeck BP and DM CBG 282

## 2020-01-31 LAB — TSH+FREE T4
Free T4: 1.2 ng/dL (ref 0.82–1.77)
TSH: 2.55 u[IU]/mL (ref 0.450–4.500)

## 2020-02-01 ENCOUNTER — Other Ambulatory Visit: Payer: Self-pay

## 2020-02-01 ENCOUNTER — Ambulatory Visit (INDEPENDENT_AMBULATORY_CARE_PROVIDER_SITE_OTHER): Payer: Self-pay | Admitting: Physician Assistant

## 2020-02-01 ENCOUNTER — Encounter: Payer: Self-pay | Admitting: Physician Assistant

## 2020-02-01 VITALS — Ht 63.0 in | Wt 188.0 lb

## 2020-02-01 DIAGNOSIS — M79671 Pain in right foot: Secondary | ICD-10-CM

## 2020-02-01 NOTE — Progress Notes (Signed)
Office Visit Note   Patient: Rebecca [name]           Date of Birth: 03-28-1966           MRN: [id number] Visit Date: 02/01/2020              Requested by: Kerin Perna, NP [address] [place],  [place] [address] PCP: Kerin Perna, NP  Chief Complaint  Patient presents with  . Right Foot - Follow-up      HPI: Patient presents in follow-up today for a callus trim beneath her second and third toe.  She is status post great toe amputation and she is doing well with regards to this she has no complaints.  She does use a metatarsal pad to off weight her forefoot she also works on stretching  Assessment & Plan: Visit Diagnoses: No diagnosis found.  Plan: After verbal consent the callus was debrided to softer surfaces.  There was no drainage  Follow-Up Instructions: No follow-ups on file.   Ortho Exam  Patient is alert, oriented, no adenopathy, well-dressed, normal affect, normal respiratory effort. Focused examination of her right foot demonstrates changes consistent with her amputation which is completely healed.  She does have a thickened callus beneath her second and third toes this was debrided to a softer surface.  No surrounding cellulitis no purulent drainage she was given a new metatarsal pad for her shoe  Imaging: No results found. No images are attached to the encounter.  Labs: Lab Results  Component Value Date   HGBA1C 9.3 (A) 01/11/2020   HGBA1C 11.0 (A) 10/03/2019   HGBA1C 14.7 (H) 07/21/2019   ESRSEDRATE 54 (H) 07/18/2017   CRP <0.8 07/18/2017   REPTSTATUS 06/27/2018 FINAL 06/22/2018   REPTSTATUS 06/27/2018 FINAL 06/22/2018   CULT  06/22/2018    NO GROWTH 5 DAYS Performed at [facility], [place] [address] [place], [place] [address]    CULT  06/22/2018    NO GROWTH 5 DAYS Performed at [facility] [address] [place], [place] [address]    LABORGA ESCHERICHIA COLI (A) 06/20/2018     Lab Results  Component  Value Date   ALBUMIN 4.5 11/14/2019   ALBUMIN 3.8 07/21/2019   ALBUMIN 4.5 06/22/2019   PREALBUMIN 26.0 07/19/2017    Lab Results  Component Value Date   MG 2.0 11/28/2018   MG 1.8 06/23/2018   MG 2.3 05/13/2015   Lab Results  Component Value Date   VD25OH 28 (L) 01/06/2010   VD25OH 23 (L) 07/02/2009    Lab Results  Component Value Date   PREALBUMIN 26.0 07/19/2017   CBC EXTENDED Latest Ref Rng & Units 11/14/2019 07/21/2019 06/22/2019  WBC 3.4 - 10.8 x10E3/uL 7.5 7.6 8.9  RBC 3.77 - 5.28 x10E6/uL 4.54 4.40 4.51  HGB 11.1 - 15.9 g/dL 13.0 12.1 12.3  HCT 34.0 - 46.6 % 37.8 36.5 38.2  PLT 150 - 450 x10E3/uL 316 239 300  NEUTROABS 1.4 - 7.0 x10E3/uL 4.9 4.5 5.8  LYMPHSABS 0.7 - 3.1 x10E3/uL 1.7 2.3 2.1     Body mass index is 33.3 kg/m.  Orders:  No orders of the defined types were placed in this encounter.  No orders of the defined types were placed in this encounter.    Procedures: No procedures performed  Clinical Data: No additional findings.  ROS:  All other systems negative, except as noted in the HPI. Review of Systems  Objective: Vital Signs: Ht 5'  3" (1.6 m)   Wt 188 lb (85.3 kg)   LMP  (LMP Unknown)   BMI 33.30 kg/m   Specialty Comments:  No specialty comments available.  PMFS History: Patient Active Problem List   Diagnosis Date Noted  . Diarrhea   . Hyperthyroidism   . Insulin dependent diabetes mellitus   . Orthostasis   . Vaginal discharge   . Infectious diarrhea 11/26/2018  . Hyperkalemia 11/26/2018  . Bacteremia 06/21/2018  . Sepsis secondary to UTI (HCC) 06/20/2018  . Great toe amputation status, right 08/05/2017  . Mild renal insufficiency 07/19/2017  . AKI (acute kidney injury) (HCC) 03/04/2016  . Essential hypertension 05/14/2015  . CAD- with distal LAD disease, ? 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 05/14/2015  . NSTEMI (non-ST elevated myocardial infarction) (HCC)   . Syncope 05/13/2015  . Hyperglycemia 10/25/2014  . Abrasion of thigh,  left 10/25/2014  . DM (diabetes mellitus), type 2, uncontrolled (HCC)   . DM hyperosmolar coma, type 2 (HCC) 05/22/2012  . Hyperosmolar syndrome 05/21/2012   Past Medical History:  Diagnosis Date  . Anemia   . Diabetes mellitus   . Dyslipidemia   . Hypertension   . Non-STEMI (non-ST elevated myocardial infarction) [facility])    Medical therapy for distal LAD dissection  . Thyroid disease     Family History  Problem Relation Age of Onset  . Hypertension Father   . Hyperlipidemia Father   . Cancer Mother   . Diabetes Sister   . Diabetes Brother   . Diabetes Sister   . Diabetes Sister   . Diabetes Sister   . Diabetes Brother   . Diabetes Brother   . Diabetes Brother   . Diabetes Brother     Past Surgical History:  Procedure Laterality Date  . AMPUTATION Right 07/21/2017   Procedure: RIGHT GREAT TOE AMPUTATION;  Surgeon: Nadara Mustard, MD;  Location: [facility] OR;  Service: Orthopedics;  Laterality: Right;  . CARDIAC CATHETERIZATION N/A 05/14/2015   Procedure: Left Heart Cath and Coronary Angiography;  Surgeon: Marykay Lex, MD; dLAD 60%>>95% (small vessel, ?dissection), CFX & RCA systems no sig dz, EF & LVEDP nl      Social History   Occupational History  . Occupation: unemployed  Tobacco Use  . Smoking status: Never Smoker  . Smokeless tobacco: Never Used  Substance and Sexual Activity  . Alcohol use: No  . Drug use: No  . Sexual activity: Yes

## 2020-02-05 ENCOUNTER — Other Ambulatory Visit (INDEPENDENT_AMBULATORY_CARE_PROVIDER_SITE_OTHER): Payer: Self-pay | Admitting: Primary Care

## 2020-02-05 DIAGNOSIS — E0590 Thyrotoxicosis, unspecified without thyrotoxic crisis or storm: Secondary | ICD-10-CM

## 2020-02-05 MED ORDER — METHIMAZOLE 5 MG PO TABS: 5.0000 mg | ORAL_TABLET | ORAL | 1 refills | Status: DC

## 2020-02-05 MED FILL — LOSARTAN POTASSIUM 100 MG T: 100 | 30 days supply | Qty: 30 | Fill #1

## 2020-02-05 MED FILL — !NOVOLOG FLEXPEN SYRINGE 1: 100/ML | 26 days supply | Qty: 12 | Fill #1

## 2020-02-07 ENCOUNTER — Telehealth (INDEPENDENT_AMBULATORY_CARE_PROVIDER_SITE_OTHER): Payer: Self-pay

## 2020-02-07 NOTE — Telephone Encounter (Signed)
-----   Message from Grayce Sessions, NP sent at 02/05/2020 10:36 AM EDT ----- Patient thyroid is normal continue Tapazole 5mg  Mon/Wed/Fri

## 2020-02-07 NOTE — Telephone Encounter (Signed)
Call placed using pacific interpreter [phone]) patient was unavailable. Per DPR results shared with son Jonetta Speak. He is aware that her thyroid is normal and she should continue taking Tapazole 5 mg on MWF; refill sent to pharmacy. He verbalized understanding and will inform patient. Maryjean Morn, CMA

## 2020-02-11 NOTE — Progress Notes (Signed)
Established Patient Office Visit  Subjective:  Patient ID: Rebecca [name], female    DOB: Sep 09, 1966  Age: 54 y.o. MRN: [id number]  CC: No chief complaint on file.   HPI Rebecca [name] presents for medication management . Recently discovered she was unable to read but understands and speaks [place]. This appointment is solely for teaching, diabetic compliance and medication reconciliation. She did as instructed brought every medication she had. Those no longer used discarded,, given a pill box, color coded bottles for HTN, Cholesterol,  And hyperthyroidism.  Past Medical History:  Diagnosis Date  . Anemia   . Diabetes mellitus   . Dyslipidemia   . Hypertension   . Non-STEMI (non-ST elevated myocardial infarction) [facility])    Medical therapy for distal LAD dissection  . Thyroid disease     Past Surgical History:  Procedure Laterality Date  . AMPUTATION Right 07/21/2017   Procedure: RIGHT GREAT TOE AMPUTATION;  Surgeon: Nadara Mustard, MD;  Location: [facility] OR;  Service: Orthopedics;  Laterality: Right;  . CARDIAC CATHETERIZATION N/A 05/14/2015   Procedure: Left Heart Cath and Coronary Angiography;  Surgeon: Marykay Lex, MD; dLAD 60%>>95% (small vessel, ?dissection), CFX & RCA systems no sig dz, EF & LVEDP nl       Family History  Problem Relation Age of Onset  . Hypertension Father   . Hyperlipidemia Father   . Cancer Mother   . Diabetes Sister   . Diabetes Brother   . Diabetes Sister   . Diabetes Sister   . Diabetes Sister   . Diabetes Brother   . Diabetes Brother   . Diabetes Brother   . Diabetes Brother     Social History   Socioeconomic History  . Marital status: Single    Spouse name: Not on file  . Number of children: Not on file  . Years of education: Not on file  . Highest education level: Not on file  Occupational History  . Occupation: unemployed  Tobacco Use  . Smoking status: Never Smoker  . Smokeless tobacco: Never Used  Substance  and Sexual Activity  . Alcohol use: No  . Drug use: No  . Sexual activity: Yes  Other Topics Concern  . Not on file  Social History Narrative  . Not on file   Social Determinants of Health   Financial Resource Strain:   . Difficulty of Paying Living Expenses:   Food Insecurity:   . Worried About Programme researcher, broadcasting/film/video in the Last Year:   . Barista in the Last Year:   Transportation Needs:   . Freight forwarder (Medical):   Marland Kitchen Lack of Transportation (Non-Medical):   Physical Activity:   . Days of Exercise per Week:   . Minutes of Exercise per Session:   Stress:   . Feeling of Stress :   Social Connections:   . Frequency of Communication with Friends and Family:   . Frequency of Social Gatherings with Friends and Family:   . Attends Religious Services:   . Active Member of Clubs or Organizations:   . Attends Banker Meetings:   Marland Kitchen Marital Status:   Intimate Partner Violence:   . Fear of Current or Ex-Partner:   . Emotionally Abused:   Marland Kitchen Physically Abused:   . Sexually Abused:     Outpatient Medications Prior to Visit  Medication Sig Dispense Refill  . aspirin 81 MG EC tablet Take 1 tablet (81 mg total) by mouth  daily. 90 tablet 1  . atorvastatin (LIPITOR) 40 MG tablet Take 1 tablet (40 mg total) by mouth at bedtime. 90 tablet 1  . gabapentin (NEURONTIN) 600 MG tablet TAKE 2 TABLETS BY MOUTH 3 TIMES DAILY. 180 tablet 1  . hydrochlorothiazide (HYDRODIURIL) 25 MG tablet Take 1 tablet (25 mg total) by mouth daily. 90 tablet 1  . insulin aspart (NOVOLOG) 100 UNIT/ML injection Inject 15 Units into the skin 3 (three) times daily before meals. 15 mL 3  . Insulin Pen Needle (PEN NEEDLES) 31G X 8 MM MISC Inject 1 each into the skin at bedtime. 30 each 5  . losartan (COZAAR) 100 MG tablet Take 1 tablet (100 mg total) by mouth daily. 90 tablet 1  . nitroGLYCERIN (NITROSTAT) 0.4 MG SL tablet Place 1 tablet (0.4 mg total) under the tongue every 5 (five) minutes as  needed for chest pain. 25 tablet 3  . potassium chloride SA (KLOR-CON) 10 MEQ tablet Take 1 tablet (10 mEq total) by mouth daily. 90 tablet 1  . sitaGLIPtin (JANUVIA) 100 MG tablet Take 1 tablet (100 mg total) by mouth daily. 90 tablet 1  . methimazole (TAPAZOLE) 5 MG tablet Take 1 tablet (5 mg total) by mouth every Monday, Wednesday, and Friday. 90 tablet 3  . Insulin Glargine (LANTUS SOLOSTAR) 100 UNIT/ML Solostar Pen Inject 34 Units into the skin 2 (two) times daily. 5 pen 6  . metFORMIN (GLUCOPHAGE XR) 500 MG 24 hr tablet Take 2 tablets (1,000 mg total) by mouth 2 (two) times daily after a meal. 120 tablet 1  . mupirocin ointment (BACTROBAN) 2 % Apply 1 application topically daily. 22 g 6  . polyethylene glycol (MIRALAX / GLYCOLAX) 17 g packet Take 17 g by mouth daily. 14 each 3   No facility-administered medications prior to visit.    No Known Allergies  ROS Review of Systems  All other systems reviewed and are negative.     Objective:    Physical Exam  Constitutional: She is oriented to person, place, and time. She appears well-developed and well-nourished.  HENT:  Head: Normocephalic.  Cardiovascular: Normal rate and regular rhythm.  Pulmonary/Chest: Breath sounds normal.  Abdominal: Bowel sounds are normal.  Musculoskeletal:        General: Normal range of motion.     Cervical back: Normal range of motion.  Neurological: She is oriented to person, place, and time.  Skin: Skin is warm and dry.  Psychiatric: She has a normal mood and affect. Her behavior is normal. Judgment and thought content normal.    BP 138/76 (BP Location: Left Arm, Patient Position: Sitting)   Pulse 94   Temp (!) 97.2 F (36.2 C)   Resp 16   Ht 5\' 3"  (1.6 m)   Wt 188 lb (85.3 kg)   LMP  (LMP Unknown)   SpO2 100%   BMI 33.30 kg/m  Wt Readings from Last 3 Encounters:  02/01/20 188 lb (85.3 kg)  01/30/20 188 lb (85.3 kg)  01/16/20 186 lb (84.4 kg)     Health Maintenance Due  Topic Date  Due  . OPHTHALMOLOGY EXAM  Never done  . Fecal DNA (Cologuard)  Never done    There are no preventive care reminders to display for this patient.  Lab Results  Component Value Date   TSH 2.550 01/30/2020   Lab Results  Component Value Date   WBC 7.5 11/14/2019   HGB 13.0 11/14/2019   HCT 37.8 11/14/2019   MCV 83  11/14/2019   PLT 316 11/14/2019   Lab Results  Component Value Date   NA 139 11/14/2019   K 5.5 (H) 11/14/2019   CO2 25 11/14/2019   GLUCOSE 391 (H) 11/14/2019   BUN 30 (H) 11/14/2019   CREATININE 1.39 (H) 11/14/2019   BILITOT 0.4 11/14/2019   ALKPHOS 259 (H) 11/14/2019   AST 105 (H) 11/14/2019   ALT 192 (H) 11/14/2019   PROT 7.8 11/14/2019   ALBUMIN 4.5 11/14/2019   CALCIUM 9.9 11/14/2019   ANIONGAP 13 07/21/2019   Lab Results  Component Value Date   CHOL 232 (H) 11/14/2019   Lab Results  Component Value Date   HDL 46 11/14/2019   Lab Results  Component Value Date   LDLCALC 151 (H) 11/14/2019   Lab Results  Component Value Date   TRIG 191 (H) 11/14/2019   Lab Results  Component Value Date   CHOLHDL 5.0 (H) 11/14/2019   Lab Results  Component Value Date   HGBA1C 9.3 (A) 01/11/2020      Assessment & Plan:  Diagnoses and all orders for this visit:  Uncontrolled type 2 diabetes mellitus with hyperglycemia (HCC) -     POCT glucose (manual entry)  Essential hypertension Improving with medication reconciliation expect continue to improve BP,130/80  Hyperthyroidism -     TSH + free T4  Encounter for medication review and counseling This appointment is solely for teaching, diabetic compliance and medication reconciliation. She did as instructed brought every medication she had. Those no longer used discarded,, given a pill box, color coded bottles for HTN, Cholesterol,  And hyperthyroidism. Patient was able to refill/demonstration return I hour and 45 mins  No orders of the defined types were placed in this encounter.   Follow-up:  Return in about 2 weeks (around 02/13/2020) for bp ck .    Kerin Perna, NP

## 2020-02-12 MED FILL — ?ATORVASTATIN 40MG TABLET: 40 | 30 days supply | Qty: 30 | Fill #1

## 2020-02-12 MED FILL — methIMAzole 5 MG TABS: 5 | 30 days supply | Qty: 12 | Fill #1

## 2020-02-13 ENCOUNTER — Ambulatory Visit (INDEPENDENT_AMBULATORY_CARE_PROVIDER_SITE_OTHER): Payer: Self-pay | Admitting: Primary Care

## 2020-02-14 ENCOUNTER — Other Ambulatory Visit: Payer: Self-pay

## 2020-02-14 ENCOUNTER — Encounter (INDEPENDENT_AMBULATORY_CARE_PROVIDER_SITE_OTHER): Payer: Self-pay | Admitting: Primary Care

## 2020-02-14 ENCOUNTER — Ambulatory Visit (INDEPENDENT_AMBULATORY_CARE_PROVIDER_SITE_OTHER): Payer: Self-pay | Admitting: Primary Care

## 2020-02-14 VITALS — BP 145/86 | HR 85 | Temp 97.5°F | Ht 63.0 in | Wt 185.6 lb

## 2020-02-14 DIAGNOSIS — I10 Essential (primary) hypertension: Secondary | ICD-10-CM

## 2020-02-14 DIAGNOSIS — Z760 Encounter for issue of repeat prescription: Secondary | ICD-10-CM

## 2020-02-14 DIAGNOSIS — E1165 Type 2 diabetes mellitus with hyperglycemia: Secondary | ICD-10-CM

## 2020-02-14 LAB — POCT CBG (FASTING - GLUCOSE)-MANUAL ENTRY: Glucose Fasting, POC: 145 mg/dL — AB (ref 70–99)

## 2020-02-14 MED ORDER — BASAGLAR KWIKPEN 100 UNIT/ML ~~LOC~~ SOPN: 34.0000 [IU] | PEN_INJECTOR | Freq: Every day | SUBCUTANEOUS | 3 refills | Status: DC

## 2020-02-14 MED FILL — GABAPENTIN 600 MG TABLET: 600 | 30 days supply | Qty: 180 | Fill #1

## 2020-02-14 MED FILL — ?BASAGLAR 100 UNITS/ML KWPE: 100 | 35 days supply | Qty: 12 | Fill #0

## 2020-02-14 NOTE — Progress Notes (Signed)
Established Patient Office Visit  Subjective:  Patient ID: Rebecca [name], female    DOB: 06-09-1966  Age: 54 y.o. MRN: [id number]  CC:  Chief Complaint  Patient presents with  . Blood Pressure Check    HPI Rebecca [name] presents for management of blood pressure inclined to change but on the last visit time was sent to color code mediation and filled pill holder. To decrease confusion will not change medication at this time.  Past Medical History:  Diagnosis Date  . Anemia   . Diabetes mellitus   . Dyslipidemia   . Hypertension   . Non-STEMI (non-ST elevated myocardial infarction) [facility])    Medical therapy for distal LAD dissection  . Thyroid disease     Past Surgical History:  Procedure Laterality Date  . AMPUTATION Right 07/21/2017   Procedure: RIGHT GREAT TOE AMPUTATION;  Surgeon: Nadara Mustard, MD;  Location: [facility] OR;  Service: Orthopedics;  Laterality: Right;  . CARDIAC CATHETERIZATION N/A 05/14/2015   Procedure: Left Heart Cath and Coronary Angiography;  Surgeon: Marykay Lex, MD; dLAD 60%>>95% (small vessel, ?dissection), CFX & RCA systems no sig dz, EF & LVEDP nl       Family History  Problem Relation Age of Onset  . Hypertension Father   . Hyperlipidemia Father   . Cancer Mother   . Diabetes Sister   . Diabetes Brother   . Diabetes Sister   . Diabetes Sister   . Diabetes Sister   . Diabetes Brother   . Diabetes Brother   . Diabetes Brother   . Diabetes Brother     Social History   Socioeconomic History  . Marital status: Single    Spouse name: Not on file  . Number of children: Not on file  . Years of education: Not on file  . Highest education level: Not on file  Occupational History  . Occupation: unemployed  Tobacco Use  . Smoking status: Never Smoker  . Smokeless tobacco: Never Used  Substance and Sexual Activity  . Alcohol use: No  . Drug use: No  . Sexual activity: Yes  Other Topics Concern  . Not on file  Social  History Narrative  . Not on file   Social Determinants of Health   Financial Resource Strain:   . Difficulty of Paying Living Expenses:   Food Insecurity:   . Worried About Programme researcher, broadcasting/film/video in the Last Year:   . Barista in the Last Year:   Transportation Needs:   . Freight forwarder (Medical):   Marland Kitchen Lack of Transportation (Non-Medical):   Physical Activity:   . Days of Exercise per Week:   . Minutes of Exercise per Session:   Stress:   . Feeling of Stress :   Social Connections:   . Frequency of Communication with Friends and Family:   . Frequency of Social Gatherings with Friends and Family:   . Attends Religious Services:   . Active Member of Clubs or Organizations:   . Attends Banker Meetings:   Marland Kitchen Marital Status:   Intimate Partner Violence:   . Fear of Current or Ex-Partner:   . Emotionally Abused:   Marland Kitchen Physically Abused:   . Sexually Abused:     Outpatient Medications Prior to Visit  Medication Sig Dispense Refill  . aspirin 81 MG EC tablet Take 1 tablet (81 mg total) by mouth daily. 90 tablet 1  . atorvastatin (LIPITOR) 40 MG tablet Take  1 tablet (40 mg total) by mouth at bedtime. 90 tablet 1  . gabapentin (NEURONTIN) 600 MG tablet TAKE 2 TABLETS BY MOUTH 3 TIMES DAILY. 180 tablet 1  . hydrochlorothiazide (HYDRODIURIL) 25 MG tablet Take 1 tablet (25 mg total) by mouth daily. 90 tablet 1  . insulin aspart (NOVOLOG) 100 UNIT/ML injection Inject 15 Units into the skin 3 (three) times daily before meals. 15 mL 3  . Insulin Pen Needle (PEN NEEDLES) 31G X 8 MM MISC Inject 1 each into the skin at bedtime. 30 each 5  . losartan (COZAAR) 100 MG tablet Take 1 tablet (100 mg total) by mouth daily. 90 tablet 1  . metFORMIN (GLUCOPHAGE XR) 500 MG 24 hr tablet Take 2 tablets (1,000 mg total) by mouth 2 (two) times daily after a meal. 120 tablet 1  . methimazole (TAPAZOLE) 5 MG tablet Take 1 tablet (5 mg total) by mouth every Monday, Wednesday, and Friday.  90 tablet 1  . mupirocin ointment (BACTROBAN) 2 % Apply 1 application topically daily. 22 g 6  . nitroGLYCERIN (NITROSTAT) 0.4 MG SL tablet Place 1 tablet (0.4 mg total) under the tongue every 5 (five) minutes as needed for chest pain. 25 tablet 3  . polyethylene glycol (MIRALAX / GLYCOLAX) 17 g packet Take 17 g by mouth daily. 14 each 3  . potassium chloride SA (KLOR-CON) 10 MEQ tablet Take 1 tablet (10 mEq total) by mouth daily. 90 tablet 1  . sitaGLIPtin (JANUVIA) 100 MG tablet Take 1 tablet (100 mg total) by mouth daily. 90 tablet 1  . Insulin Glargine (LANTUS SOLOSTAR) 100 UNIT/ML Solostar Pen Inject 34 Units into the skin 2 (two) times daily. 5 pen 6   No facility-administered medications prior to visit.    No Known Allergies  ROS Review of Systems    Objective:    Physical Exam  BP (!) 145/86 (BP Location: Right Arm, Patient Position: Sitting, Cuff Size: Normal)   Pulse 85   Temp (!) 97.5 F (36.4 C) (Temporal)   Ht 5\' 3"  (1.6 m)   Wt 185 lb 9.6 oz (84.2 kg)   LMP  (LMP Unknown)   SpO2 97%   BMI 32.88 kg/m  Wt Readings from Last 3 Encounters:  02/21/20 185 lb (83.9 kg)  02/14/20 185 lb 9.6 oz (84.2 kg)  02/01/20 188 lb (85.3 kg)     Health Maintenance Due  Topic Date Due  . OPHTHALMOLOGY EXAM  Never done  . Fecal DNA (Cologuard)  Never done    There are no preventive care reminders to display for this patient.  Lab Results  Component Value Date   TSH 2.550 01/30/2020   Lab Results  Component Value Date   WBC 7.5 11/14/2019   HGB 13.0 11/14/2019   HCT 37.8 11/14/2019   MCV 83 11/14/2019   PLT 316 11/14/2019   Lab Results  Component Value Date   NA 139 11/14/2019   K 5.5 (H) 11/14/2019   CO2 25 11/14/2019   GLUCOSE 391 (H) 11/14/2019   BUN 30 (H) 11/14/2019   CREATININE 1.39 (H) 11/14/2019   BILITOT 0.4 11/14/2019   ALKPHOS 259 (H) 11/14/2019   AST 105 (H) 11/14/2019   ALT 192 (H) 11/14/2019   PROT 7.8 11/14/2019   ALBUMIN 4.5 11/14/2019    CALCIUM 9.9 11/14/2019   ANIONGAP 13 07/21/2019   Lab Results  Component Value Date   CHOL 232 (H) 11/14/2019   Lab Results  Component Value Date  HDL 46 11/14/2019   Lab Results  Component Value Date   LDLCALC 151 (H) 11/14/2019   Lab Results  Component Value Date   TRIG 191 (H) 11/14/2019   Lab Results  Component Value Date   CHOLHDL 5.0 (H) 11/14/2019   Lab Results  Component Value Date   HGBA1C 9.3 (A) 01/11/2020      Assessment & Plan:  Sade was seen today for blood pressure check.  Diagnoses and all orders for this visit:  Uncontrolled type 2 diabetes mellitus with hyperglycemia (HCC) This has been challenging but I feel we on a good path to achieve our goals with more frequent follow up and continue to help manage medication.ADA recommends the following therapeutic goals for glycemic control related to A1c measurements: Goal of therapy: Less than 6.5 hemoglobin A1c.  Reference clinical practice recommendations. Foods that are high in carbohydrates are the following rice, potatoes, breads, sugars, and pastas.  Reduction in the intake (eating) will assist in lowering your blood sugars. -     Glucose (CBG), Fasting -     Insulin Glargine (BASAGLAR KWIKPEN) 100 UNIT/ML; Inject 0.34 mLs (34 Units total) into the skin daily.  Medication refill  Glucose (CBG), Fasting -     Insulin Glargine (BASAGLAR KWIKPEN) 100 UNIT/ML; Inject 0.34 mLs (34 Units total) into the skin daily.   Essential hypertension Not controlled at this time refer to HPI.Counseled on blood pressure goal of less than 130/80, low-sodium, DASH diet, medication compliance, 150 minutes of moderate intensity exercise per week. Discussed medication compliance, adverse effects.      Medication refill          Meds ordered this encounter  Medications  . Insulin Glargine (BASAGLAR KWIKPEN) 100 UNIT/ML    Sig: Inject 0.34 mLs (34 Units total) into the skin daily.    Dispense:  5 pen    Refill:   3    Follow-up: Return in about 2 months (around 04/15/2020) for DM/HTN in person .    Grayce Sessions, NP.

## 2020-02-20 ENCOUNTER — Ambulatory Visit: Payer: Self-pay

## 2020-02-20 NOTE — Telephone Encounter (Signed)
Daughter called on pt's behalf.  Daughter is not with pt.  Has pt. On a conference call with triage nurse.  Reported pt. C/o dizziness since Saturday. Reported she c/o headache, feels off balance, feels weak and shaky when she stands and walks.  Reported she felt like she could faint x 1 earlier today.  Denied feeling faint now.  Reported she is currently laying down, and the symptoms improve if she is laying.  Daughter denied that pt. Has been confused or slurring her words, in speaking with her on the phone.  Denied chest pain or shortness of breath.  Reported today her legs and arms started cramping.  Reported blood sugar at 11:00 AM was 400; rechecked at 3:45 PM and BS was down to 150.  Reported she has taken diabetic medication as prescribed. Denied any nausea or vomiting.  Advised daughter that with current symptoms, pt. should be evaluated today in the ER.  Daughter relayed information to pt. While on phone.  Pt. Agreed to get ride to the ER.    Reason for Disposition . [1] Dizziness caused by heat exposure, sudden standing, or poor fluid intake AND [2] no improvement after 2 hours of rest and fluids  Answer Assessment - Initial Assessment Questions 1. DESCRIPTION: "Describe your dizziness."     C/o headache, feels off balance, feels like she tilts to one side with walking  2. LIGHTHEADED: "Do you feel lightheaded?" (e.g., somewhat faint, woozy, weak upon standing)     Felt like she could pass out once today, when she tried to go outside 3. VERTIGO: "Do you feel like either you or the room is spinning or tilting?" (i.e. vertigo)     Denied  4. SEVERITY: "How bad is it?"  "Do you feel like you are going to faint?" "Can you stand and walk?"   - MILD - walking normally   - MODERATE - interferes with normal activities (e.g., work, school)    - SEVERE - unable to stand, requires support to walk, feels like passing out now.      Moderate ; if she lays down the symptoms improves; worsens with getting  up and moving about.  5. ONSET:  "When did the dizziness begin?"     Saturday, 5/29 6. AGGRAVATING FACTORS: "Does anything make it worse?" (e.g., standing, change in head position)    Moving about makes it worse 7. HEART RATE: "Can you tell me your heart rate?" "How many beats in 15 seconds?"  (Note: not all patients can do this)       blood sugar was 400 @ 11:00 AM;  BS 150 @ 3:45 PM ; taking Insulin and diabetic medication on schedule. 8. CAUSE: "What do you think is causing the dizziness?"     unknown 9. RECURRENT SYMPTOM: "Have you had dizziness before?" If so, ask: "When was the last time?" "What happened that time?"     Has had this in the past; daughter stated she has a lot of medical problems 10. OTHER SYMPTOMS: "Do you have any other symptoms?" (e.g., fever, chest pain, vomiting, diarrhea, bleeding)      Denied any loss of vision, gets weak and shaky when she walks, headache and dizziness worsens with moving about.   11. PREGNANCY: "Is there any chance you are pregnant?" "When was your last menstrual period?"       n/a  Protocols used: DIZZINESS [facility]

## 2020-02-21 ENCOUNTER — Encounter: Payer: Self-pay | Admitting: Family

## 2020-02-21 ENCOUNTER — Ambulatory Visit (INDEPENDENT_AMBULATORY_CARE_PROVIDER_SITE_OTHER): Payer: Self-pay | Admitting: Family

## 2020-02-21 ENCOUNTER — Other Ambulatory Visit: Payer: Self-pay

## 2020-02-21 VITALS — Ht 63.0 in | Wt 185.0 lb

## 2020-02-21 DIAGNOSIS — L97511 Non-pressure chronic ulcer of other part of right foot limited to breakdown of skin: Secondary | ICD-10-CM | POA: Insufficient documentation

## 2020-02-21 DIAGNOSIS — Z89411 Acquired absence of right great toe: Secondary | ICD-10-CM

## 2020-02-21 NOTE — Progress Notes (Signed)
Office Visit Note   Patient: Rebecca [name]           Date of Birth: 1966-07-25           MRN: [id number] Visit Date: 02/21/2020              Requested by: Kerin Perna, NP [address] [place],  [place] [address] PCP: Kerin Perna, NP  Chief Complaint  Patient presents with  . Right Foot - Pain      HPI: The patient is a 54 year old woman who presents today in routine follow-up for Wagner grade 1 ulcer beneath the third metatarsal head of her right foot.  She is status post great toe amputation of the same foot has had issues with callus buildup since.   She does have a postop shoe with all pressure relieving doughnut to she has been wearing extra-depth diabetic shoe with an arch support.    Assessment & Plan: Visit Diagnoses:  1. Ulcer of right foot, limited to breakdown of skin ([place])   2. Right great toe amputee [facility])     Plan: I sent an order to [place] [facility] for custom orthotics feel that pressure relief will ultimately be helpful in preventing callus buildup and healing of this ulcer.  She will continue with her daily wound care and antibacterial ointment dressing changes.  Follow-Up Instructions: Return in about 4 weeks (around 03/20/2020).   Ortho Exam  Patient is alert, oriented, no adenopathy, well-dressed, normal affect, normal respiratory effort. On examination bilateral lower extremity she does have some heel cord tightness with dorsiflexion to neutral.  To the right foot beneath the second and third metatarsal head she has some callus buildup with central ulceration.  This measures 2 cm in diameter.  Probes 3 mm deep.  There is no active drainage. this was debrided with a 10 blade knife back to viable tissue.  No erythema no tenderness no sign of infection  Imaging: No results found. No images are attached to the encounter.  Labs: Lab Results  Component Value Date   HGBA1C 9.3 (A) 01/11/2020   HGBA1C 11.0 (A) 10/03/2019   HGBA1C  14.7 (H) 07/21/2019   ESRSEDRATE 54 (H) 07/18/2017   CRP <0.8 07/18/2017   REPTSTATUS 06/27/2018 FINAL 06/22/2018   REPTSTATUS 06/27/2018 FINAL 06/22/2018   CULT  06/22/2018    NO GROWTH 5 DAYS Performed at [facility], [place] [address] [place], [place] [address]    CULT  06/22/2018    NO GROWTH 5 DAYS Performed at [facility] [address], [place], [place] [address]    LABORGA ESCHERICHIA COLI (A) 06/20/2018     Lab Results  Component Value Date   ALBUMIN 4.5 11/14/2019   ALBUMIN 3.8 07/21/2019   ALBUMIN 4.5 06/22/2019   PREALBUMIN 26.0 07/19/2017    Lab Results  Component Value Date   MG 2.0 11/28/2018   MG 1.8 06/23/2018   MG 2.3 05/13/2015   Lab Results  Component Value Date   VD25OH 28 (L) 01/06/2010   VD25OH 23 (L) 07/02/2009    Lab Results  Component Value Date   PREALBUMIN 26.0 07/19/2017   CBC EXTENDED Latest Ref Rng & Units 11/14/2019 07/21/2019 06/22/2019  WBC 3.4 - 10.8 x10E3/uL 7.5 7.6 8.9  RBC 3.77 - 5.28 x10E6/uL 4.54 4.40 4.51  HGB 11.1 - 15.9 g/dL 13.0 12.1 12.3  HCT 34.0 - 46.6 % 37.8 36.5 38.2  PLT 150 - 450 x10E3/uL 316 239  300  NEUTROABS 1.4 - 7.0 x10E3/uL 4.9 4.5 5.8  LYMPHSABS 0.7 - 3.1 x10E3/uL 1.7 2.3 2.1     Body mass index is 32.77 kg/m.  Orders:  No orders of the defined types were placed in this encounter.  No orders of the defined types were placed in this encounter.    Procedures: No procedures performed  Clinical Data: No additional findings.  ROS:  All other systems negative, except as noted in the HPI. Review of Systems  Constitutional: Negative for chills and fever.  Skin: Positive for wound. Negative for color change.    Objective: Vital Signs: Ht 5\' 3"  (1.6 m)   Wt 185 lb (83.9 kg)   LMP  (LMP Unknown)   BMI 32.77 kg/m   Specialty Comments:  No specialty comments available.  PMFS History: Patient Active Problem List   Diagnosis Date Noted  . Ulcer of right foot, limited to  breakdown of skin (HCC) 02/21/2020  . Diarrhea   . Hyperthyroidism   . Insulin dependent diabetes mellitus   . Orthostasis   . Vaginal discharge   . Infectious diarrhea 11/26/2018  . Hyperkalemia 11/26/2018  . Bacteremia 06/21/2018  . Sepsis secondary to UTI (HCC) 06/20/2018  . Right great toe amputee (HCC) 08/05/2017  . Mild renal insufficiency 07/19/2017  . AKI (acute kidney injury) (HCC) 03/04/2016  . Essential hypertension 05/14/2015  . CAD- with distal LAD disease, ? 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 05/14/2015  . NSTEMI (non-ST elevated myocardial infarction) (HCC)   . Syncope 05/13/2015  . Hyperglycemia 10/25/2014  . Abrasion of thigh, left 10/25/2014  . DM (diabetes mellitus), type 2, uncontrolled (HCC)   . DM hyperosmolar coma, type 2 (HCC) 05/22/2012  . Hyperosmolar syndrome 05/21/2012   Past Medical History:  Diagnosis Date  . Anemia   . Diabetes mellitus   . Dyslipidemia   . Hypertension   . Non-STEMI (non-ST elevated myocardial infarction) [facility])    Medical therapy for distal LAD dissection  . Thyroid disease     Family History  Problem Relation Age of Onset  . Hypertension Father   . Hyperlipidemia Father   . Cancer Mother   . Diabetes Sister   . Diabetes Brother   . Diabetes Sister   . Diabetes Sister   . Diabetes Sister   . Diabetes Brother   . Diabetes Brother   . Diabetes Brother   . Diabetes Brother     Past Surgical History:  Procedure Laterality Date  . AMPUTATION Right 07/21/2017   Procedure: RIGHT GREAT TOE AMPUTATION;  Surgeon: 07/23/2017, MD;  Location: [facility] OR;  Service: Orthopedics;  Laterality: Right;  . CARDIAC CATHETERIZATION N/A 05/14/2015   Procedure: Left Heart Cath and Coronary Angiography;  Surgeon: 05/16/2015, MD; dLAD 60%>>95% (small vessel, ?dissection), CFX & RCA systems no sig dz, EF & LVEDP nl      Social History   Occupational History  . Occupation: unemployed  Tobacco Use  . Smoking status: Never Smoker  . Smokeless  tobacco: Never Used  Substance and Sexual Activity  . Alcohol use: No  . Drug use: No  . Sexual activity: Yes

## 2020-03-04 ENCOUNTER — Ambulatory Visit: Payer: Self-pay | Admitting: Physician Assistant

## 2020-03-05 MED FILL — NOVOLOG FLEXPEN SYRINGE: 100 | 26 days supply | Qty: 12 | Fill #2

## 2020-03-11 MED FILL — methIMAzole 5 MG TABS: 5 | 30 days supply | Qty: 12 | Fill #2

## 2020-03-11 MED FILL — ?ATORVASTATIN 40MG TABLET: 40 | 30 days supply | Qty: 30 | Fill #2

## 2020-03-11 MED FILL — ?BASAGLAR 100 UNITS/ML KWPE: 100 | 35 days supply | Qty: 12 | Fill #1

## 2020-03-20 ENCOUNTER — Other Ambulatory Visit: Payer: Self-pay

## 2020-03-20 ENCOUNTER — Encounter: Payer: Self-pay | Admitting: Family

## 2020-03-20 ENCOUNTER — Ambulatory Visit (INDEPENDENT_AMBULATORY_CARE_PROVIDER_SITE_OTHER): Payer: Self-pay | Admitting: Family

## 2020-03-20 VITALS — Ht 63.0 in | Wt 185.0 lb

## 2020-03-20 DIAGNOSIS — L97511 Non-pressure chronic ulcer of other part of right foot limited to breakdown of skin: Secondary | ICD-10-CM

## 2020-03-20 DIAGNOSIS — Z89411 Acquired absence of right great toe: Secondary | ICD-10-CM

## 2020-03-20 NOTE — Progress Notes (Signed)
Office Visit Note   Patient: Rebecca [name]           Date of Birth: Jan 25, 1966           MRN: [id number] Visit Date: 03/20/2020              Requested by: Grayce Sessions, NP [address] [place],  [place] [address] PCP: Grayce Sessions, NP  Chief Complaint  Patient presents with  . Right Foot - Follow-up      HPI: The patient is a 54 year old woman who presents today in routine follow-up for Wagner grade 1 ulcer beneath the third metatarsal head of her right foot.  She is status post great toe amputation of the same foot has had issues with callus buildup since.   She does have a extra-depth diabetic shoe with an orthotic.    Assessment & Plan: Visit Diagnoses:  No diagnosis found.  Plan: She will wear her custom orthotic and protective shoewear. She will continue with her daily wound care and antibacterial ointment dressing changes. Continue to work on heel cord stretching.  Follow-Up Instructions: Return in about 6 weeks (around 05/01/2020).   Ortho Exam  Patient is alert, oriented, no adenopathy, well-dressed, normal affect, normal respiratory effort. On examination bilateral lower extremity she does have some heel cord tightness with dorsiflexion to neutral.  To the right foot beneath the second and third metatarsal head she has some callus buildup with central ulceration.  This measures 2 cm in diameter.  Is 1 mm deep, does not probe.  There is no active drainage. Nonviable tissue was debrided with a 10 blade knife back to viable tissue.  No erythema no tenderness no sign of infection  Imaging: No results found. No images are attached to the encounter.  Labs: Lab Results  Component Value Date   HGBA1C 9.3 (A) 01/11/2020   HGBA1C 11.0 (A) 10/03/2019   HGBA1C 14.7 (H) 07/21/2019   ESRSEDRATE 54 (H) 07/18/2017   CRP <0.8 07/18/2017   REPTSTATUS 06/27/2018 FINAL 06/22/2018   REPTSTATUS 06/27/2018 FINAL 06/22/2018   CULT  06/22/2018    NO  GROWTH 5 DAYS Performed at [facility], [address], [place], [place] [address]    CULT  06/22/2018    NO GROWTH 5 DAYS Performed at [facility], [address], [place], [place] [address]    LABORGA ESCHERICHIA COLI (A) 06/20/2018     Lab Results  Component Value Date   ALBUMIN 4.5 11/14/2019   ALBUMIN 3.8 07/21/2019   ALBUMIN 4.5 06/22/2019   PREALBUMIN 26.0 07/19/2017    Lab Results  Component Value Date   MG 2.0 11/28/2018   MG 1.8 06/23/2018   MG 2.3 05/13/2015   Lab Results  Component Value Date   VD25OH 28 (L) 01/06/2010   VD25OH 23 (L) 07/02/2009    Lab Results  Component Value Date   PREALBUMIN 26.0 07/19/2017   CBC EXTENDED Latest Ref Rng & Units 11/14/2019 07/21/2019 06/22/2019  WBC 3.4 - 10.8 x10E3/uL 7.5 7.6 8.9  RBC 3.77 - 5.28 x10E6/uL 4.54 4.40 4.51  HGB 11.1 - 15.9 g/dL 82.9 93.7 16.9  HCT 67.8 - 46.6 % 37.8 36.5 38.2  PLT 150 - 450 x10E3/uL 316 239 300  NEUTROABS 1 - 7 x10E3/uL 4.9 4.5 5.8  LYMPHSABS 0 - 3 x10E3/uL 1.7 2.3 2.1     Body mass index is 32.77 kg/m.  Orders:  No orders of the defined types were placed  in this encounter.  No orders of the defined types were placed in this encounter.    Procedures: No procedures performed  Clinical Data: No additional findings.  ROS:  All other systems negative, except as noted in the HPI. Review of Systems  Constitutional: Negative for chills and fever.  Skin: Positive for wound. Negative for color change.    Objective: Vital Signs: Ht 5\' 3"  (1.6 m)   Wt 185 lb (83.9 kg)   LMP  (LMP Unknown)   BMI 32.77 kg/m   Specialty Comments:  No specialty comments available.  PMFS History: Patient Active Problem List   Diagnosis Date Noted  . Ulcer of right foot, limited to breakdown of skin (HCC) 02/21/2020  . Diarrhea   . Hyperthyroidism   . Insulin dependent diabetes mellitus   . Orthostasis   . Vaginal discharge   . Infectious diarrhea 11/26/2018  .  Hyperkalemia 11/26/2018  . Bacteremia 06/21/2018  . Sepsis secondary to UTI (HCC) 06/20/2018  . Right great toe amputee (HCC) 08/05/2017  . Mild renal insufficiency 07/19/2017  . AKI (acute kidney injury) (HCC) 03/04/2016  . Essential hypertension 05/14/2015  . CAD- with distal LAD disease, ? 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 05/14/2015  . NSTEMI (non-ST elevated myocardial infarction) (HCC)   . Syncope 05/13/2015  . Hyperglycemia 10/25/2014  . Abrasion of thigh, left 10/25/2014  . DM (diabetes mellitus), type 2, uncontrolled (HCC)   . DM hyperosmolar coma, type 2 (HCC) 05/22/2012  . Hyperosmolar syndrome 05/21/2012   Past Medical History:  Diagnosis Date  . Anemia   . Diabetes mellitus   . Dyslipidemia   . Hypertension   . Non-STEMI (non-ST elevated myocardial infarction) [facility])    Medical therapy for distal LAD dissection  . Thyroid disease     Family History  Problem Relation Age of Onset  . Hypertension Father   . Hyperlipidemia Father   . Cancer Mother   . Diabetes Sister   . Diabetes Brother   . Diabetes Sister   . Diabetes Sister   . Diabetes Sister   . Diabetes Brother   . Diabetes Brother   . Diabetes Brother   . Diabetes Brother     Past Surgical History:  Procedure Laterality Date  . AMPUTATION Right 07/21/2017   Procedure: RIGHT GREAT TOE AMPUTATION;  Surgeon: 07/23/2017, MD;  Location: [facility] OR;  Service: Orthopedics;  Laterality: Right;  . CARDIAC CATHETERIZATION N/A 05/14/2015   Procedure: Left Heart Cath and Coronary Angiography;  Surgeon: 05/16/2015, MD; dLAD 60%>>95% (small vessel, ?dissection), CFX & RCA systems no sig dz, EF & LVEDP nl      Social History   Occupational History  . Occupation: unemployed  Tobacco Use  . Smoking status: Never Smoker  . Smokeless tobacco: Never Used  Vaping Use  . Vaping Use: Never used  Substance and Sexual Activity  . Alcohol use: No  . Drug use: No  . Sexual activity: Yes

## 2020-03-26 ENCOUNTER — Other Ambulatory Visit (INDEPENDENT_AMBULATORY_CARE_PROVIDER_SITE_OTHER): Payer: Self-pay | Admitting: Primary Care

## 2020-03-26 DIAGNOSIS — Z760 Encounter for issue of repeat prescription: Secondary | ICD-10-CM

## 2020-03-26 DIAGNOSIS — E1165 Type 2 diabetes mellitus with hyperglycemia: Secondary | ICD-10-CM

## 2020-03-26 DIAGNOSIS — E7841 Elevated Lipoprotein(a): Secondary | ICD-10-CM

## 2020-03-26 MED ORDER — PEN NEEDLES 31G X 8 MM MISC: 1.0000 | Freq: Every day | 1 refills | Status: DC

## 2020-03-26 MED ORDER — GABAPENTIN 600 MG PO TABS: ORAL_TABLET | ORAL | 0 refills | Status: DC

## 2020-03-26 MED ORDER — BASAGLAR KWIKPEN 100 UNIT/ML ~~LOC~~ SOPN: 34.0000 [IU] | PEN_INJECTOR | Freq: Every day | SUBCUTANEOUS | 3 refills | Status: DC

## 2020-03-26 MED ORDER — INSULIN ASPART 100 UNIT/ML ~~LOC~~ SOLN: 15.0000 [IU] | Freq: Three times a day (TID) | SUBCUTANEOUS | 3 refills | Status: DC

## 2020-03-26 MED FILL — methIMAzole 5 MG TABS: 5 | 30 days supply | Qty: 12 | Fill #2

## 2020-03-26 MED FILL — ?ATORVASTATIN 40MG TABLET: 40 | 30 days supply | Qty: 30 | Fill #2

## 2020-03-26 MED FILL — ?BASAGLAR 100 UNITS/ML KWPE: 100 | 35 days supply | Qty: 12 | Fill #1

## 2020-03-26 NOTE — Telephone Encounter (Signed)
Requested Prescriptions  Pending Prescriptions Disp Refills  . gabapentin (NEURONTIN) 600 MG tablet 180 tablet 0    Sig: TAKE 2 TABLETS BY MOUTH 3 TIMES DAILY.     Neurology: Anticonvulsants - gabapentin Passed - 03/26/2020  2:51 PM      Passed - Valid encounter within last 12 months    Recent Outpatient Visits          1 month ago Uncontrolled type 2 diabetes mellitus with hyperglycemia (HCC)   [facility] Gwinda Passe P, NP   1 month ago Uncontrolled type 2 diabetes mellitus with hyperglycemia (HCC)   [facility] Gwinda Passe P, NP   2 months ago Uncontrolled type 2 diabetes mellitus with hyperglycemia (HCC)   [facility] Gwinda Passe P, NP   2 months ago Uncontrolled type 2 diabetes mellitus with hyperglycemia (HCC)   [facility] Gwinda Passe P, NP   4 months ago Uncontrolled type 2 diabetes mellitus with hyperglycemia (HCC)   [facility] Grayce Sessions, NP      Future Appointments            In 2 weeks Randa Evens, Kinnie Scales, NP [facility]   In 1 month Adonis Huguenin, NP [facility]           . insulin aspart (NOVOLOG) 100 UNIT/ML injection 15 mL 3    Sig: Inject 15 Units into the skin 3 (three) times daily before meals.     Endocrinology:  Diabetes - Insulins Failed - 03/26/2020  2:51 PM      Failed - HBA1C is between 0 and 7.9 and within 180 days    Hemoglobin A1C  Date Value Ref Range Status  01/11/2020 9.3 (A) 4.0 - 5.6 % Final   Hgb A1c MFr Bld  Date Value Ref Range Status  07/21/2019 14.7 (H) 4.8 - 5.6 % Final    Comment:             Prediabetes: 5.7 - 6.4          Diabetes: >6.4          Glycemic control for adults with diabetes: <7.0          Passed - Valid encounter within last 6 months    Recent Outpatient Visits          1 month ago Uncontrolled type 2 diabetes mellitus with  hyperglycemia (HCC)   [facility] Gwinda Passe P, NP   1 month ago Uncontrolled type 2 diabetes mellitus with hyperglycemia (HCC)   [facility] Gwinda Passe P, NP   2 months ago Uncontrolled type 2 diabetes mellitus with hyperglycemia (HCC)   [facility] Gwinda Passe P, NP   2 months ago Uncontrolled type 2 diabetes mellitus with hyperglycemia (HCC)   [facility] Gwinda Passe P, NP   4 months ago Uncontrolled type 2 diabetes mellitus with hyperglycemia (HCC)   [facility] Grayce Sessions, NP      Future Appointments            In 2 weeks Randa Evens, Kinnie Scales, NP [facility]   In 1 month Adonis Huguenin, NP [facility]           . Insulin Glargine (BASAGLAR KWIKPEN) 100 UNIT/ML 5  pen 3    Sig: Inject 0.34 mLs (34 Units total) into the skin daily.     Endocrinology:  Diabetes - Insulins Failed - 03/26/2020  2:51 PM      Failed - HBA1C is between 0 and 7.9 and within 180 days    Hemoglobin A1C  Date Value Ref Range Status  01/11/2020 9.3 (A) 4.0 - 5.6 % Final   Hgb A1c MFr Bld  Date Value Ref Range Status  07/21/2019 14.7 (H) 4.8 - 5.6 % Final    Comment:             Prediabetes: 5.7 - 6.4          Diabetes: >6.4          Glycemic control for adults with diabetes: <7.0          Passed - Valid encounter within last 6 months    Recent Outpatient Visits          1 month ago Uncontrolled type 2 diabetes mellitus with hyperglycemia (HCC)   [facility] Gwinda Passe P, NP   1 month ago Uncontrolled type 2 diabetes mellitus with hyperglycemia (HCC)   [facility] Gwinda Passe P, NP   2 months ago Uncontrolled type 2 diabetes mellitus with hyperglycemia (HCC)   [facility] Gwinda Passe P, NP   2 months ago Uncontrolled type 2  diabetes mellitus with hyperglycemia (HCC)   [facility] Gwinda Passe P, NP   4 months ago Uncontrolled type 2 diabetes mellitus with hyperglycemia (HCC)   [facility] Grayce Sessions, NP      Future Appointments            In 2 weeks Randa Evens, Kinnie Scales, NP [facility]   In 1 month Adonis Huguenin, NP [facility]           . Insulin Pen Needle (PEN NEEDLES) 31G X 8 MM MISC 90 each 1    Sig: Inject 1 each into the skin at bedtime.     Endocrinology: Diabetes - Testing Supplies Passed - 03/26/2020  2:51 PM      Passed - Valid encounter within last 12 months    Recent Outpatient Visits          1 month ago Uncontrolled type 2 diabetes mellitus with hyperglycemia (HCC)   [facility] Gwinda Passe P, NP   1 month ago Uncontrolled type 2 diabetes mellitus with hyperglycemia (HCC)   [facility] Gwinda Passe P, NP   2 months ago Uncontrolled type 2 diabetes mellitus with hyperglycemia (HCC)   [facility] Gwinda Passe P, NP   2 months ago Uncontrolled type 2 diabetes mellitus with hyperglycemia (HCC)   [facility] Gwinda Passe P, NP   4 months ago Uncontrolled type 2 diabetes mellitus with hyperglycemia (HCC)   [facility] Grayce Sessions, NP      Future Appointments            In 2 weeks Randa Evens, Kinnie Scales, NP [facility]   In 1 month Adonis Huguenin, NP [facility] [place]           Refused Prescriptions Disp Refills  . atorvastatin (LIPITOR) 40 MG tablet 90 tablet 1    Sig: Take 1  tablet (40 mg total) by mouth at bedtime.     Cardiovascular:  Antilipid - Statins Failed - 03/26/2020  2:51 PM      Failed - Total Cholesterol in normal range and within 360 days    Cholesterol, Total  Date Value Ref Range Status   11/14/2019 232 (H) 100 - 199 mg/dL Final         Failed - LDL in normal range and within 360 days    LDL Chol Calc (NIH)  Date Value Ref Range Status  11/14/2019 151 (H) 0 - 99 mg/dL Final         Failed - Triglycerides in normal range and within 360 days    Triglycerides  Date Value Ref Range Status  11/14/2019 191 (H) 0 - 149 mg/dL Final         Passed - HDL in normal range and within 360 days    HDL  Date Value Ref Range Status  11/14/2019 46 >39 mg/dL Final         Passed - Patient is not pregnant      Passed - Valid encounter within last 12 months    Recent Outpatient Visits          1 month ago Uncontrolled type 2 diabetes mellitus with hyperglycemia (HCC)   [facility] Gwinda Passe P, NP   1 month ago Uncontrolled type 2 diabetes mellitus with hyperglycemia (HCC)   [facility] Gwinda Passe P, NP   2 months ago Uncontrolled type 2 diabetes mellitus with hyperglycemia (HCC)   [facility] Gwinda Passe P, NP   2 months ago Uncontrolled type 2 diabetes mellitus with hyperglycemia (HCC)   [facility] Gwinda Passe P, NP   4 months ago Uncontrolled type 2 diabetes mellitus with hyperglycemia (HCC)   [facility] Grayce Sessions, NP      Future Appointments            In 2 weeks Randa Evens, Kinnie Scales, NP [facility]   In 1 month Adonis Huguenin, NP [facility] [place]

## 2020-03-26 NOTE — Telephone Encounter (Signed)
Medication Refill - Medication: atorvastatin, gabapentin, insulin, and a sleep medication pt cant remember the name  Has the patient contacted their pharmacy? Yes.   Pt states that the pharmacy has told her they will not fill the medication without doctors permission. Pt states that she has not had her insulin in days. Please advise.  (Agent: If no, request that the patient contact the pharmacy for the refill.) (Agent: If yes, when and what did the pharmacy advise?)  Preferred Pharmacy (with phone number or street name):  [facility] - [place], [place] - [address]  [address] [place] [address]  Phone: [phone] Fax: [phone]  Hours: Not open 24 hours     Agent: Please be advised that RX refills may take up to 3 business days. We ask that you follow-up with your pharmacy.

## 2020-03-27 MED FILL — NOVOLOG FLEXPEN SYRINGE: 100 | 26 days supply | Qty: 12 | Fill #0

## 2020-03-27 MED FILL — TRUEPLUS PEN NDL 31GX5/16: 31G X 8 MM | 25 days supply | Qty: 100 | Fill #0

## 2020-03-27 MED FILL — GABAPENTIN 600 MG TABLET: 600 | 30 days supply | Qty: 180 | Fill #0

## 2020-04-15 ENCOUNTER — Ambulatory Visit (INDEPENDENT_AMBULATORY_CARE_PROVIDER_SITE_OTHER): Payer: Self-pay | Admitting: Primary Care

## 2020-04-15 ENCOUNTER — Encounter (INDEPENDENT_AMBULATORY_CARE_PROVIDER_SITE_OTHER): Payer: Self-pay | Admitting: Primary Care

## 2020-04-15 ENCOUNTER — Other Ambulatory Visit: Payer: Self-pay

## 2020-04-15 VITALS — BP 143/80 | HR 91 | Temp 97.6°F | Resp 16 | Ht 62.0 in | Wt 184.0 lb

## 2020-04-15 DIAGNOSIS — Z1159 Encounter for screening for other viral diseases: Secondary | ICD-10-CM

## 2020-04-15 DIAGNOSIS — E0590 Thyrotoxicosis, unspecified without thyrotoxic crisis or storm: Secondary | ICD-10-CM

## 2020-04-15 DIAGNOSIS — I10 Essential (primary) hypertension: Secondary | ICD-10-CM

## 2020-04-15 DIAGNOSIS — E1165 Type 2 diabetes mellitus with hyperglycemia: Secondary | ICD-10-CM

## 2020-04-15 DIAGNOSIS — E875 Hyperkalemia: Secondary | ICD-10-CM

## 2020-04-15 LAB — POCT GLYCOSYLATED HEMOGLOBIN (HGB A1C): Hemoglobin A1C: 11.4 % — AB (ref 4.0–5.6)

## 2020-04-15 LAB — GLUCOSE, POCT (MANUAL RESULT ENTRY): POC Glucose: 169 mg/dl — AB (ref 70–99)

## 2020-04-15 NOTE — Progress Notes (Signed)
Established Patient Office Visit  Subjective:  Patient ID: Rebecca [name], female    DOB: 1966-05-09  Age: 54 y.o. MRN: [id number]  CC: No chief complaint on file.   HPI [name] presents for the management of T2D, she denies any polyuria, polydipsia, and polyphagias. Denies shortness of breath, headaches, chest pain or lower extremity edema  Past Medical History:  Diagnosis Date   Anemia    Diabetes mellitus    Dyslipidemia    Hypertension    Non-STEMI (non-ST elevated myocardial infarction) [facility])    Medical therapy for distal LAD dissection   Thyroid disease     Past Surgical History:  Procedure Laterality Date   AMPUTATION Right 07/21/2017   Procedure: RIGHT GREAT TOE AMPUTATION;  Surgeon: Nadara Mustard, MD;  Location: [facility] OR;  Service: Orthopedics;  Laterality: Right;   CARDIAC CATHETERIZATION N/A 05/14/2015   Procedure: Left Heart Cath and Coronary Angiography;  Surgeon: Marykay Lex, MD; dLAD 60%>>95% (small vessel, ?dissection), CFX & RCA systems no sig dz, EF & LVEDP nl       Family History  Problem Relation Age of Onset   Hypertension Father    Hyperlipidemia Father    Cancer Mother    Diabetes Sister    Diabetes Brother    Diabetes Sister    Diabetes Sister    Diabetes Sister    Diabetes Brother    Diabetes Brother    Diabetes Brother    Diabetes Brother     Social History   Socioeconomic History   Marital status: Single    Spouse name: Not on file   Number of children: Not on file   Years of education: Not on file   Highest education level: Not on file  Occupational History   Occupation: unemployed  Tobacco Use   Smoking status: Never Smoker   Smokeless tobacco: Never Used  Building services engineer Use: Never used  Substance and Sexual Activity   Alcohol use: No   Drug use: No   Sexual activity: Yes  Other Topics Concern   Not on file  Social History Narrative   Not on file    Social Determinants of Health   Financial Resource Strain:    Difficulty of Paying Living Expenses:   Food Insecurity:    Worried About Programme researcher, broadcasting/film/video in the Last Year:    Barista in the Last Year:   Transportation Needs:    Freight forwarder (Medical):    Lack of Transportation (Non-Medical):   Physical Activity:    Days of Exercise per Week:    Minutes of Exercise per Session:   Stress:    Feeling of Stress :   Social Connections:    Frequency of Communication with Friends and Family:    Frequency of Social Gatherings with Friends and Family:    Attends Religious Services:    Active Member of Clubs or Organizations:    Attends Engineer, structural:    Marital Status:   Intimate Partner Violence:    Fear of Current or Ex-Partner:    Emotionally Abused:    Physically Abused:    Sexually Abused:     Outpatient Medications Prior to Visit  Medication Sig Dispense Refill   aspirin 81 MG EC tablet Take 1 tablet (81 mg total) by mouth daily. 90 tablet 1   atorvastatin (LIPITOR) 40 MG tablet Take 1 tablet (40 mg total) by mouth at bedtime. 90  tablet 1   gabapentin (NEURONTIN) 600 MG tablet TAKE 2 TABLETS BY MOUTH 3 TIMES DAILY. 180 tablet 0   hydrochlorothiazide (HYDRODIURIL) 25 MG tablet Take 1 tablet (25 mg total) by mouth daily. 90 tablet 1   insulin aspart (NOVOLOG) 100 UNIT/ML injection Inject 15 Units into the skin 3 (three) times daily before meals. 15 mL 3   Insulin Glargine (BASAGLAR KWIKPEN) 100 UNIT/ML Inject 0.34 mLs (34 Units total) into the skin daily. 5 pen 3   Insulin Pen Needle (PEN NEEDLES) 31G X 8 MM MISC Inject 1 each into the skin at bedtime. 90 each 1   losartan (COZAAR) 100 MG tablet Take 1 tablet (100 mg total) by mouth daily. 90 tablet 1   metFORMIN (GLUCOPHAGE XR) 500 MG 24 hr tablet Take 2 tablets (1,000 mg total) by mouth 2 (two) times daily after a meal. 120 tablet 1   methimazole (TAPAZOLE) 5 MG  tablet Take 1 tablet (5 mg total) by mouth every Monday, Wednesday, and Friday. 90 tablet 1   mupirocin ointment (BACTROBAN) 2 % Apply 1 application topically daily. 22 g 6   nitroGLYCERIN (NITROSTAT) 0.4 MG SL tablet Place 1 tablet (0.4 mg total) under the tongue every 5 (five) minutes as needed for chest pain. 25 tablet 3   polyethylene glycol (MIRALAX / GLYCOLAX) 17 g packet Take 17 g by mouth daily. 14 each 3   potassium chloride SA (KLOR-CON) 10 MEQ tablet Take 1 tablet (10 mEq total) by mouth daily. 90 tablet 1   sitaGLIPtin (JANUVIA) 100 MG tablet Take 1 tablet (100 mg total) by mouth daily. 90 tablet 1   No facility-administered medications prior to visit.    No Known Allergies  ROS Review of Systems  All other systems reviewed and are negative.     Objective:    Physical Exam Vitals reviewed.  Constitutional:      Appearance: She is obese.  HENT:     Head: Normocephalic.     Right Ear: Tympanic membrane normal.     Left Ear: Tympanic membrane normal.  Eyes:     Extraocular Movements: Extraocular movements intact.     Pupils: Pupils are equal, round, and reactive to light.  Cardiovascular:     Rate and Rhythm: Normal rate and regular rhythm.     Heart sounds: Normal heart sounds.  Pulmonary:     Effort: Pulmonary effort is normal.     Breath sounds: Normal breath sounds.  Abdominal:     General: Bowel sounds are normal.  Musculoskeletal:        General: Normal range of motion.  Skin:    General: Skin is warm.  Neurological:     Mental Status: She is alert and oriented to person, place, and time.  Psychiatric:        Mood and Affect: Mood normal.        Behavior: Behavior normal.        Thought Content: Thought content normal.        Judgment: Judgment normal.     BP (!) 143/80    Pulse 91    Temp 97.6 F (36.4 C)    Resp 16    Ht 5\' 2"  (1.575 m)    Wt 184 lb (83.5 kg)    LMP  (LMP Unknown)    SpO2 96%    BMI 33.65 kg/m  Wt Readings from Last 3  Encounters:  04/15/20 184 lb (83.5 kg)  03/20/20 185 lb (83.9 kg)  02/21/20 185 lb (83.9 kg)     Health Maintenance Due  Topic Date Due   Hepatitis C Screening  Never done   OPHTHALMOLOGY EXAM  Never done   Fecal DNA (Cologuard)  Never done    There are no preventive care reminders to display for this patient.  Lab Results  Component Value Date   TSH 2.550 01/30/2020   Lab Results  Component Value Date   WBC 7.5 11/14/2019   HGB 13.0 11/14/2019   HCT 37.8 11/14/2019   MCV 83 11/14/2019   PLT 316 11/14/2019   Lab Results  Component Value Date   NA 139 11/14/2019   K 5.5 (H) 11/14/2019   CO2 25 11/14/2019   GLUCOSE 391 (H) 11/14/2019   BUN 30 (H) 11/14/2019   CREATININE 1.39 (H) 11/14/2019   BILITOT 0.4 11/14/2019   ALKPHOS 259 (H) 11/14/2019   AST 105 (H) 11/14/2019   ALT 192 (H) 11/14/2019   PROT 7.8 11/14/2019   ALBUMIN 4.5 11/14/2019   CALCIUM 9.9 11/14/2019   ANIONGAP 13 07/21/2019   Lab Results  Component Value Date   CHOL 232 (H) 11/14/2019   Lab Results  Component Value Date   HDL 46 11/14/2019   Lab Results  Component Value Date   LDLCALC 151 (H) 11/14/2019   Lab Results  Component Value Date   TRIG 191 (H) 11/14/2019   Lab Results  Component Value Date   CHOLHDL 5.0 (H) 11/14/2019   Lab Results  Component Value Date   HGBA1C 11.4 (A) 04/15/2020      Assessment & Plan:   Diagnoses and all orders for this visit:  Uncontrolled type 2 diabetes mellitus with hyperglycemia (HCC) -     Glucose (CBG) -     HgB A1c 11.4 she has gone up in A1C previously 9.3  improvement .Unclear the cause of spike medications remains color coded. Reinforced compliance and taking medication every day. She is excited to report she is going to the [facility] 3 times a week exercising. -     Comprehensive metabolic panel; Future  Hyperthyroidism Currently on Tapazole 5 mg Monday Wednesday and Friday she has not seen a endocrinologist will refer to reevaluate  and treat -     TSH + free T4  Encounter for hepatitis C screening test for low risk patient Health maintenance and care gap -     Hepatitis C Antibody  Essential hypertension Counseled on blood pressure goal of less than 130/80, low-sodium, DASH diet, medication compliance, with joining the  and exercising at least 3 times a week should provide some Bp reduction.Discussed medication compliance, -     Comprehensive metabolic panel;  -     CBC with Differential  Hyperkalemia Previously elevated stopped medication -     Comprehensive metabolic panel; Future -     Lipid Panel    Follow-up: Return in about 3 months (around 07/16/2020) for in person T2D.    Grayce Sessions, NP

## 2020-04-15 NOTE — Progress Notes (Signed)
Here for DM f /u   

## 2020-04-16 LAB — CBC WITH DIFFERENTIAL/PLATELET

## 2020-04-16 LAB — LIPID PANEL
Chol/HDL Ratio: 4.9 ratio — ABNORMAL HIGH (ref 0.0–4.4)
Cholesterol, Total: 227 mg/dL — ABNORMAL HIGH (ref 100–199)
HDL: 46 mg/dL (ref >39)
LDL Chol Calc (NIH): 148 mg/dL — ABNORMAL HIGH (ref 0–99)
Triglycerides: 181 mg/dL — ABNORMAL HIGH (ref 0–149)
VLDL Cholesterol Cal: 33 mg/dL (ref 5–40)

## 2020-04-16 LAB — HEPATITIS C ANTIBODY: Hep C Virus Ab: <0.1 s/co ratio (ref 0.0–0.9)

## 2020-04-16 LAB — TSH+FREE T4
Free T4: 1.12 ng/dL (ref 0.82–1.77)
TSH: 2.18 u[IU]/mL (ref 0.450–4.500)

## 2020-04-16 MED FILL — LOSARTAN POTASSIUM 100 MG T: 100 | 30 days supply | Qty: 30 | Fill #3

## 2020-04-19 ENCOUNTER — Ambulatory Visit (INDEPENDENT_AMBULATORY_CARE_PROVIDER_SITE_OTHER): Payer: Self-pay | Admitting: Family

## 2020-04-19 ENCOUNTER — Encounter: Payer: Self-pay | Admitting: Family

## 2020-04-19 VITALS — Ht 62.0 in | Wt 184.0 lb

## 2020-04-19 DIAGNOSIS — L97511 Non-pressure chronic ulcer of other part of right foot limited to breakdown of skin: Secondary | ICD-10-CM

## 2020-04-19 DIAGNOSIS — Z89411 Acquired absence of right great toe: Secondary | ICD-10-CM

## 2020-04-19 DIAGNOSIS — E1142 Type 2 diabetes mellitus with diabetic polyneuropathy: Secondary | ICD-10-CM

## 2020-04-19 DIAGNOSIS — M6701 Short Achilles tendon (acquired), right ankle: Secondary | ICD-10-CM

## 2020-04-19 MED ORDER — ROPINIROLE HCL 0.25 MG PO TABS: 0.2500 mg | ORAL_TABLET | Freq: Every day | ORAL | 1 refills | Status: DC

## 2020-04-19 MED FILL — !NOVOLOG FLEXPEN SYRINGE 1: 100/ML | 26 days supply | Qty: 12 | Fill #1

## 2020-04-19 MED FILL — rOPINIRole HCL 0.25 MG TABS: 0.25 | 30 days supply | Qty: 30 | Fill #0

## 2020-04-19 NOTE — Progress Notes (Signed)
Office Visit Note   Patient: Rebecca [name]           Date of Birth: 1966-06-05           MRN: [id number] Visit Date: 04/19/2020              Requested by: Grayce Sessions, NP [address] [place],  [place] [address] PCP: Grayce Sessions, NP  Chief Complaint  Patient presents with  . Right Foot - Follow-up      HPI: The patient is a 54 year old woman who presents today for routine follow-up for her [address] grade 1 ulcer beneath the third metatarsal head.  We have been trimming the callus every 4 weeks.  With callus buildup she has increased pain limping today.  She is also complaining of new aggravating symptoms of the bilateral lower extremities she describes the urge to move her legs some pain she does have a history of neuropathy states this feels different this is primarily at rest keeps her from sleeping well at night most move her legs she states that she feels more comfortable when she sleeps in her shoe so she can quickly get up.  She is already taking 1200 mg of gabapentin 3 times daily.  She states that she has tried Lyrica in the past  She does have a extra-depth diabetic shoe with an orthotic.    Assessment & Plan: Visit Diagnoses:  No diagnosis found.  Plan: She will wear her custom orthotic and protective shoewear. She will continue with her daily wound care and antibacterial ointment dressing changes. Continue to work on heel cord stretching.  Will add requip for what seems to be restless leg.  Hope that her primary care will take over management of this.  We will follow-up with her in 4 weeks.  Follow-Up Instructions: Return in about 4 weeks (around 05/17/2020).   Ortho Exam  Patient is alert, oriented, no adenopathy, well-dressed, normal affect, normal respiratory effort. On examination bilateral lower extremity she does have some heel cord tightness with dorsiflexion to neutral.  To the right foot beneath the second and third metatarsal  head she has some callus buildup with central ulceration.  This measures 1.5 cm in diameter. Centrally is 5 mm x 2 mm, Is 5 mm deep, does not probe.  There is no active drainage. Nonviable tissue was debrided with a 10 blade knife back to viable tissue.  No erythema no tenderness no sign of infection  Imaging: No results found. No images are attached to the encounter.  Labs: Lab Results  Component Value Date   HGBA1C 11.4 (A) 04/15/2020   HGBA1C 9.3 (A) 01/11/2020   HGBA1C 11.0 (A) 10/03/2019   ESRSEDRATE 54 (H) 07/18/2017   CRP <0.8 07/18/2017   REPTSTATUS 06/27/2018 FINAL 06/22/2018   REPTSTATUS 06/27/2018 FINAL 06/22/2018   CULT  06/22/2018    NO GROWTH 5 DAYS Performed at [facility], [address] [place], [place] [address]    CULT  06/22/2018    NO GROWTH 5 DAYS Performed at [facility], [address] [place], [place] [address]    LABORGA ESCHERICHIA COLI (A) 06/20/2018     Lab Results  Component Value Date   ALBUMIN 4.5 11/14/2019   ALBUMIN 3.8 07/21/2019   ALBUMIN 4.5 06/22/2019   PREALBUMIN 26.0 07/19/2017    Lab Results  Component Value Date   MG 2.0 11/28/2018   MG 1.8 06/23/2018   MG 2.3 05/13/2015  Lab Results  Component Value Date   VD25OH 28 (L) 01/06/2010   VD25OH 23 (L) 07/02/2009    Lab Results  Component Value Date   PREALBUMIN 26.0 07/19/2017   CBC EXTENDED Latest Ref Rng & Units 04/15/2020 11/14/2019 07/21/2019  WBC x10E3/uL CANCELED 7.5 7.6  RBC - CANCELED 4.54 4.40  HGB - CANCELED 13.0 12.1  HCT - CANCELED 37.8 36.5  PLT - CANCELED 316 239  NEUTROABS 1 - 7 x10E3/uL - 4.9 4.5  LYMPHSABS - CANCELED 1.7 2.3     Body mass index is 33.65 kg/m.  Orders:  No orders of the defined types were placed in this encounter.  Meds ordered this encounter  Medications  . rOPINIRole (REQUIP) 0.25 MG tablet    Sig: Take 1 tablet (0.25 mg total) by mouth at bedtime.    Dispense:  60 tablet    Refill:  1      Procedures: No procedures performed  Clinical Data: No additional findings.  ROS:  All other systems negative, except as noted in the HPI. Review of Systems  Constitutional: Negative for chills and fever.  Skin: Positive for wound. Negative for color change.    Objective: Vital Signs: Ht 5\' 2"  (1.575 m)   Wt 184 lb (83.5 kg)   LMP  (LMP Unknown)   BMI 33.65 kg/m   Specialty Comments:  No specialty comments available.  PMFS History: Patient Active Problem List   Diagnosis Date Noted  . Ulcer of right foot, limited to breakdown of skin (HCC) 02/21/2020  . Diarrhea   . Hyperthyroidism   . Insulin dependent diabetes mellitus   . Orthostasis   . Vaginal discharge   . Infectious diarrhea 11/26/2018  . Hyperkalemia 11/26/2018  . Bacteremia 06/21/2018  . Sepsis secondary to UTI (HCC) 06/20/2018  . Right great toe amputee (HCC) 08/05/2017  . Mild renal insufficiency 07/19/2017  . AKI (acute kidney injury) (HCC) 03/04/2016  . Essential hypertension 05/14/2015  . CAD- with distal LAD disease, ? 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 05/14/2015  . NSTEMI (non-ST elevated myocardial infarction) (HCC)   . Syncope 05/13/2015  . Hyperglycemia 10/25/2014  . Abrasion of thigh, left 10/25/2014  . DM (diabetes mellitus), type 2, uncontrolled (HCC)   . DM hyperosmolar coma, type 2 (HCC) 05/22/2012  . Hyperosmolar syndrome 05/21/2012   Past Medical History:  Diagnosis Date  . Anemia   . Diabetes mellitus   . Dyslipidemia   . Hypertension   . Non-STEMI (non-ST elevated myocardial infarction) [facility])    Medical therapy for distal LAD dissection  . Thyroid disease     Family History  Problem Relation Age of Onset  . Hypertension Father   . Hyperlipidemia Father   . Cancer Mother   . Diabetes Sister   . Diabetes Brother   . Diabetes Sister   . Diabetes Sister   . Diabetes Sister   . Diabetes Brother   . Diabetes Brother   . Diabetes Brother   . Diabetes Brother     Past Surgical History:   Procedure Laterality Date  . AMPUTATION Right 07/21/2017   Procedure: RIGHT GREAT TOE AMPUTATION;  Surgeon: 07/23/2017, MD;  Location: [facility] OR;  Service: Orthopedics;  Laterality: Right;  . CARDIAC CATHETERIZATION N/A 05/14/2015   Procedure: Left Heart Cath and Coronary Angiography;  Surgeon: 05/16/2015, MD; dLAD 60%>>95% (small vessel, ?dissection), CFX & RCA systems no sig dz, EF & LVEDP nl      Social History   Occupational History  .  Occupation: unemployed  Tobacco Use  . Smoking status: Never Smoker  . Smokeless tobacco: Never Used  Vaping Use  . Vaping Use: Never used  Substance and Sexual Activity  . Alcohol use: No  . Drug use: No  . Sexual activity: Yes

## 2020-04-19 NOTE — Patient Instructions (Signed)
Sndrome de las piernas inquietas Restless Legs Syndrome El sndrome de las piernas inquietas es una afeccin que causa incomodidad o [employer] piernas, especialmente al estar sentado o [place]. Por lo general, estas sensaciones provocan la necesidad urgente de [employer]. A veces, tambin [employer]. La afeccin puede ser de leve a grave. Los sntomas a menudo interfieren en la capacidad de dormir de Arts administrator. Cules son las causas? Se desconoce la causa de esta afeccin. Qu incrementa el riesgo? Los siguientes factores pueden hacer que usted sea ms propenso a Best boy esta afeccin:  Ser mayor de [id number].  Embarazo.  Ser mujer. En general, esta afeccin es ms comn en las mujeres que en los hombres.  Antecedentes familiares de la enfermedad.  Tener dficit de hierro.  Consumir cafena, nicotina o alcohol de forma excesiva.  Determinadas enfermedades, como enfermedad renal, enfermedad de Parkinson o dao nervioso.  Determinados medicamentos, como aquellos para la presin arterial alta, las nuseas, los resfros, las [place]ury, la depresin y algunos trastornos cardacos. Cules son los signos o los sntomas? El sntoma principal de este trastorno son sensaciones molestas en las piernas, tales como:  Tironeo.  Hormigueo.  Pinchazos.  Punzadas.  Algo que camina por la pierna.  Quemazn. Por lo general, las sensaciones:  Afectan ambos lados del cuerpo.  Son ms intensas cuando est sentado o acostado.  Empeoran por la noche. Estas pueden despertarlo o hacer que sea difcil conciliar el sueo.  Hacen que tenga una necesidad imperiosa de mover las piernas.  Se alivian de forma temporal al mover las piernas. Pueden afectar [employer], pero esto es poco frecuente. Las personas con este trastorno suelen estar cansadas durante el da debido a la falta de sueo por la noche. Cmo se diagnostica? Esta afeccin se puede diagnosticar  en funcin de lo siguiente:  Sus sntomas.  Anlisis de [place]. En algunos casos, un especialista puede controlarlo en un laboratorio del sueo (un estudio del sueo). Esto puede detectar cualquier alteracin en el sueo. Cmo se trata? Esta afeccin se trata controlando los sntomas. Esto puede incluir lo siguiente:  Cambios en el estilo de vida, como hacer actividad fsica, usar tcnicas de relajacin y evitar la cafena, el alcohol o el tabaco.  Medicamentos. Los anticonvulsivos pueden probarse primero. Siga estas indicaciones en su casa:     Instrucciones generales  Delphi de venta libre y los recetados solamente como se lo haya indicado el mdico.  Use mtodos para ayudar a Public house manager las sensaciones molestas, por ejemplo: ? Masajes en las piernas. ? Caminar o hacer ejercicios de estiramiento. ? Tomar un bao de inmersin fro o caliente.  Concurra a todas las visitas de control como se lo haya indicado el mdico. Esto es importante. Estilo de vida  Adopte buenos hbitos de sueo. Por ejemplo, acustese y levntese a la misma hora todos [place]. La mayora de los adultos debera dormir entre 7 y 27 horas todas las noches.  Haga ejercicio regularmente. Intente realizar al menos 9minutos de ejercicio la [employer] de la [place].  Practique actividades que lo relajen, como yoga o meditacin.  Evite la cafena y el alcohol.  No consuma ningn producto que contenga nicotina o tabaco, como cigarrillos y Psychologist, sport and exercise. Si necesita ayuda para dejar de fumar, consulte al mdico. Comunquese con un mdico si:  Los sntomas empeoran o no mejoran con Dispensing optician. Resumen  El sndrome de las piernas inquietas es una afeccin que  causa incomodidad o [employer] piernas, especialmente al estar sentado o [place].  Los sntomas a menudo interfieren en la capacidad de dormir de Medical laboratory scientific officer.  Esta afeccin se trata controlando los sntomas.  Tal vez deba hacer cambios en su estilo de vida o tomar medicamentos. Esta informacin no tiene Theme park manager el consejo del mdico. Asegrese de hacerle al mdico cualquier pregunta que tenga. Document Revised: 11/17/2017 Document Reviewed: 11/17/2017 Elsevier Patient Education  2020 Elsevier Inc. Restless Legs Syndrome Restless legs syndrome is a condition that causes uncomfortable feelings or sensations in the legs, especially while sitting or lying down. The sensations usually cause an overwhelming urge to move the legs. The arms can also sometimes be affected. The condition can range from mild to severe. The symptoms often interfere with a person's ability to sleep. What are the causes? The cause of this condition is not known. What increases the risk? The following factors may make you more likely to develop this condition:  Being older than 50.  Pregnancy.  Being a woman. In general, the condition is more common in women than in men.  A family history of the condition.  Having iron deficiency.  Overuse of caffeine, nicotine, or alcohol.  Certain medical conditions, such as kidney disease, Parkinson's disease, or nerve damage.  Certain medicines, such as those for high blood pressure, nausea, colds, allergies, depression, and some heart conditions. What are the signs or symptoms? The main symptom of this condition is uncomfortable sensations in the legs, such as:  Pulling.  Tingling.  Prickling.  Throbbing.  Crawling.  Burning. Usually, the sensations:  Affect both sides of the body.  Are worse when you sit or lie down.  Are worse at night. These may wake you up or make it difficult to fall asleep.  Make you have a strong urge to move your legs.  Are temporarily relieved by moving your legs. The arms can also be affected, but this is rare. People who have this condition often have tiredness during the day because of their lack of sleep at night. How  is this diagnosed? This condition may be diagnosed based on:  Your symptoms.  Blood tests. In some cases, you may be monitored in a sleep lab by a specialist (a sleep study). This can detect any disruptions in your sleep. How is this treated? This condition is treated by managing the symptoms. This may include:  Lifestyle changes, such as exercising, using relaxation techniques, and avoiding caffeine, alcohol, or tobacco.  Medicines. Anti-seizure medicines may be tried first. Follow these instructions at home:     General instructions  Take over-the-counter and prescription medicines only as told by your health care provider.  Use methods to help relieve the uncomfortable sensations, such as: ? Massaging your legs. ? Walking or stretching. ? Taking a cold or hot bath.  Keep all follow-up visits as told by your health care provider. This is important. Lifestyle  Practice good sleep habits. For example, go to bed and get up at the same time every day. Most adults should get 7-9 hours of sleep each night.  Exercise regularly. Try to get at least 30 minutes of exercise most days of the week.  Practice ways of relaxing, such as yoga or meditation.  Avoid caffeine and alcohol.  Do not use any products that contain nicotine or tobacco, such as cigarettes and e-cigarettes. If you need help quitting, ask your health care provider. Contact a health care provider if:  Your symptoms get worse or they do not improve with treatment. Summary  Restless legs syndrome is a condition that causes uncomfortable feelings or sensations in the legs, especially while sitting or lying down.  The symptoms often interfere with a person's ability to sleep.  This condition is treated by managing the symptoms. You may need to make lifestyle changes or take medicines. This information is not intended to replace advice given to you by your health care provider. Make sure you discuss any questions you  have with your health care provider. Document Revised: 09/27/2017 Document Reviewed: 09/27/2017 Elsevier Patient Education  2020 [employer].

## 2020-04-22 MED FILL — NOVOLOG FLEXPEN SYRINGE: 100 | 26 days supply | Qty: 12 | Fill #1

## 2020-04-24 MED FILL — ?BASAGLAR 100 UNITS/ML KWPE: 100 | 35 days supply | Qty: 12 | Fill #2

## 2020-04-24 MED FILL — methIMAzole 5 MG TABS: 5 | 30 days supply | Qty: 12 | Fill #3

## 2020-04-26 ENCOUNTER — Encounter (INDEPENDENT_AMBULATORY_CARE_PROVIDER_SITE_OTHER): Payer: Self-pay

## 2020-05-01 ENCOUNTER — Ambulatory Visit: Payer: Self-pay | Admitting: Family

## 2020-05-03 ENCOUNTER — Other Ambulatory Visit (INDEPENDENT_AMBULATORY_CARE_PROVIDER_SITE_OTHER): Payer: Self-pay | Admitting: Primary Care

## 2020-05-03 DIAGNOSIS — Z760 Encounter for issue of repeat prescription: Secondary | ICD-10-CM

## 2020-05-03 DIAGNOSIS — E1165 Type 2 diabetes mellitus with hyperglycemia: Secondary | ICD-10-CM

## 2020-05-03 MED ORDER — GABAPENTIN 600 MG PO TABS: ORAL_TABLET | ORAL | 0 refills | Status: DC

## 2020-05-03 MED FILL — GABAPENTIN 600 MG TABLET: 600 | 30 days supply | Qty: 180 | Fill #0

## 2020-05-03 NOTE — Telephone Encounter (Signed)
Medication Refill - Medication: gabapentin (NEURONTIN) 600 MG tablet    Preferred Pharmacy (with phone number or street name):  [facility] [place], [place] - [address] Phone:  [phone]  Fax:  [phone]       Agent: Please be advised that RX refills may take up to 3 business days. We ask that you follow-up with your pharmacy.

## 2020-05-03 NOTE — Telephone Encounter (Signed)
Requested Prescriptions  Pending Prescriptions Disp Refills  . gabapentin (NEURONTIN) 600 MG tablet 180 tablet 0    Sig: TAKE 2 TABLETS BY MOUTH 3 TIMES DAILY.     Neurology: Anticonvulsants - gabapentin Passed - 05/03/2020 11:44 AM      Passed - Valid encounter within last 12 months    Recent Outpatient Visits          2 weeks ago Uncontrolled type 2 diabetes mellitus with hyperglycemia (HCC)   [facility] Gwinda Passe P, NP   2 months ago Uncontrolled type 2 diabetes mellitus with hyperglycemia (HCC)   [facility] Gwinda Passe P, NP   3 months ago Uncontrolled type 2 diabetes mellitus with hyperglycemia (HCC)   [facility] Gwinda Passe P, NP   3 months ago Uncontrolled type 2 diabetes mellitus with hyperglycemia (HCC)   [facility] Gwinda Passe P, NP   3 months ago Uncontrolled type 2 diabetes mellitus with hyperglycemia (HCC)   [facility] Grayce Sessions, NP      Future Appointments            In 2 weeks Adonis Huguenin, NP [facility]   In 2 months Randa Evens, Kinnie Scales, NP [facility]

## 2020-05-07 ENCOUNTER — Other Ambulatory Visit (INDEPENDENT_AMBULATORY_CARE_PROVIDER_SITE_OTHER): Payer: Self-pay | Admitting: Primary Care

## 2020-05-07 DIAGNOSIS — E1165 Type 2 diabetes mellitus with hyperglycemia: Secondary | ICD-10-CM

## 2020-05-07 DIAGNOSIS — Z760 Encounter for issue of repeat prescription: Secondary | ICD-10-CM

## 2020-05-14 ENCOUNTER — Other Ambulatory Visit (INDEPENDENT_AMBULATORY_CARE_PROVIDER_SITE_OTHER): Payer: Self-pay | Admitting: Primary Care

## 2020-05-14 DIAGNOSIS — E1165 Type 2 diabetes mellitus with hyperglycemia: Secondary | ICD-10-CM

## 2020-05-14 DIAGNOSIS — Z794 Long term (current) use of insulin: Secondary | ICD-10-CM

## 2020-05-14 DIAGNOSIS — Z760 Encounter for issue of repeat prescription: Secondary | ICD-10-CM

## 2020-05-14 MED ORDER — GABAPENTIN 600 MG PO TABS: ORAL_TABLET | ORAL | 0 refills | Status: DC

## 2020-05-14 MED ORDER — BASAGLAR KWIKPEN 100 UNIT/ML ~~LOC~~ SOPN: 34.0000 [IU] | PEN_INJECTOR | Freq: Every day | SUBCUTANEOUS | 0 refills | Status: DC

## 2020-05-14 MED ORDER — SITAGLIPTIN PHOSPHATE 100 MG PO TABS: 100.0000 mg | ORAL_TABLET | Freq: Every day | ORAL | 1 refills | Status: DC

## 2020-05-14 MED FILL — GABAPENTIN 600 MG TABLET: 600 | 30 days supply | Qty: 180 | Fill #0

## 2020-05-14 NOTE — Addendum Note (Signed)
Addended by: Lisabeth Pick on: 05/14/2020 09:45 AM   Modules accepted: Orders

## 2020-05-14 NOTE — Telephone Encounter (Signed)
PT need a refill gabapentin (NEURONTIN) 600 MG tablet [[id number]] sitaGLIPtin (JANUVIA) 100 MG tablet [[id number]] nsulin Glargine [facility] KWIKPEN) 100 UNIT/ML [[id number]]  [facility] - [place], [place] - [place] [address]  [address] [place] [address]  Phone: [phone] Fax: [phone]

## 2020-05-15 MED FILL — JANUVIA 100 MG TABLET: 100 | 30 days supply | Qty: 30 | Fill #0

## 2020-05-17 ENCOUNTER — Ambulatory Visit (INDEPENDENT_AMBULATORY_CARE_PROVIDER_SITE_OTHER): Payer: Self-pay | Admitting: Family

## 2020-05-17 ENCOUNTER — Encounter: Payer: Self-pay | Admitting: Family

## 2020-05-17 VITALS — Ht 62.0 in | Wt 184.0 lb

## 2020-05-17 DIAGNOSIS — Z89411 Acquired absence of right great toe: Secondary | ICD-10-CM

## 2020-05-17 DIAGNOSIS — L97511 Non-pressure chronic ulcer of other part of right foot limited to breakdown of skin: Secondary | ICD-10-CM

## 2020-05-17 DIAGNOSIS — M6701 Short Achilles tendon (acquired), right ankle: Secondary | ICD-10-CM

## 2020-05-21 ENCOUNTER — Encounter: Payer: Self-pay | Admitting: Family

## 2020-05-21 ENCOUNTER — Ambulatory Visit: Payer: Self-pay

## 2020-05-21 NOTE — Progress Notes (Signed)
Office Visit Note   Patient: Rebecca [name]           Date of Birth: [birth year]           MRN: [id number] Visit Date: 05/17/2020              Requested by: Grayce Sessions, NP [address] [place],  [place] [address] PCP: Grayce Sessions, NP  Chief Complaint  Patient presents with  . Right Foot - Follow-up      HPI: The patient is a 54 year old woman who presents today for routine follow-up for her [address] grade 1 ulcer beneath the third metatarsal head.  We have been trimming the callus every 4 weeks. Continues to have constant pain with ambulation beneath ulcer, this gradually worsens over time. Is relieved with paring of callus.    She is taking 1200 mg of gabapentin 3 times daily.  She states that she has tried Lyrica in the past  She does have a extra-depth diabetic shoe with an orthotic.    Assessment & Plan: Visit Diagnoses:  No diagnosis found.  Plan: She will wear her custom orthotic and protective shoewear. She will continue with her daily wound care and antibacterial ointment dressing changes. Continue to work on heel cord stretching.   requip for what seems to be restless leg.  Hope that her primary care will take over management of this.  We will follow-up with her in 4 weeks.  Follow-Up Instructions: Return in about 4 weeks (around 06/14/2020).   Ortho Exam  Patient is alert, oriented, no adenopathy, well-dressed, normal affect, normal respiratory effort. On examination bilateral lower extremity she does have some heel cord tightness with dorsiflexion to neutral.  To the right foot beneath the second and third metatarsal head she has some callus buildup with central ulceration.  This measures 1.5 cm in diameter. Largely unchanged. Centrally is 5 mm x 2 mm, Is 5 mm deep, does not probe.  There is no active drainage. Nonviable tissue was debrided with a 10 blade knife back to viable tissue.  No erythema no tenderness no sign of  infection  Imaging: No results found. No images are attached to the encounter.  Labs: Lab Results  Component Value Date   HGBA1C 11.4 (A) 04/15/2020   HGBA1C 9.3 (A) 01/11/2020   HGBA1C 11.0 (A) 10/03/2019   ESRSEDRATE 54 (H) 07/18/2017   CRP <0.8 07/18/2017   REPTSTATUS 06/27/2018 FINAL 06/22/2018   REPTSTATUS 06/27/2018 FINAL 06/22/2018   CULT  06/22/2018    NO GROWTH 5 DAYS Performed at [facility], [address], [place], [place] [address]    CULT  06/22/2018    NO GROWTH 5 DAYS Performed at [facility], [address], [place], [place] [address]    LABORGA ESCHERICHIA COLI (A) 06/20/2018     Lab Results  Component Value Date   ALBUMIN 4.5 11/14/2019   ALBUMIN 3.8 07/21/2019   ALBUMIN 4.5 06/22/2019   PREALBUMIN 26.0 07/19/2017    Lab Results  Component Value Date   MG 2.0 11/28/2018   MG 1.8 06/23/2018   MG 2.3 05/13/2015   Lab Results  Component Value Date   VD25OH 28 (L) 01/06/2010   VD25OH 23 (L) 07/02/2009    Lab Results  Component Value Date   PREALBUMIN 26.0 07/19/2017   CBC EXTENDED Latest Ref Rng & Units 04/15/2020 11/14/2019 07/21/2019  WBC x10E3/uL CANCELED 7.5 7.6  RBC - CANCELED 4.54 4.40  HGB -  CANCELED 13.0 12.1  HCT - CANCELED 37.8 36.5  PLT - CANCELED 316 239  NEUTROABS 1 - 7 x10E3/uL - 4.9 4.5  LYMPHSABS - CANCELED 1.7 2.3     Body mass index is 33.65 kg/m.  Orders:  No orders of the defined types were placed in this encounter.  No orders of the defined types were placed in this encounter.    Procedures: No procedures performed  Clinical Data: No additional findings.  ROS:  All other systems negative, except as noted in the HPI. Review of Systems  Constitutional: Negative for chills and fever.  Skin: Positive for wound. Negative for color change.    Objective: Vital Signs: Ht 5\' 2"  (1.575 m)   Wt 184 lb (83.5 kg)   LMP  (LMP Unknown)   BMI 33.65 kg/m   Specialty Comments:  No specialty  comments available.  PMFS History: Patient Active Problem List   Diagnosis Date Noted  . Ulcer of right foot, limited to breakdown of skin (HCC) 02/21/2020  . Diarrhea   . Hyperthyroidism   . Insulin dependent diabetes mellitus   . Orthostasis   . Vaginal discharge   . Infectious diarrhea 11/26/2018  . Hyperkalemia 11/26/2018  . Bacteremia 06/21/2018  . Sepsis secondary to UTI (HCC) 06/20/2018  . Right great toe amputee (HCC) 08/05/2017  . Mild renal insufficiency 07/19/2017  . AKI (acute kidney injury) (HCC) 03/04/2016  . Essential hypertension 05/14/2015  . CAD- with distal LAD disease, ? 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 05/14/2015  . NSTEMI (non-ST elevated myocardial infarction) (HCC)   . Syncope 05/13/2015  . Hyperglycemia 10/25/2014  . Abrasion of thigh, left 10/25/2014  . DM (diabetes mellitus), type 2, uncontrolled (HCC)   . DM hyperosmolar coma, type 2 (HCC) 05/22/2012  . Hyperosmolar syndrome 05/21/2012   Past Medical History:  Diagnosis Date  . Anemia   . Diabetes mellitus   . Dyslipidemia   . Hypertension   . Non-STEMI (non-ST elevated myocardial infarction) [facility])    Medical therapy for distal LAD dissection  . Thyroid disease     Family History  Problem Relation Age of Onset  . Hypertension Father   . Hyperlipidemia Father   . Cancer Mother   . Diabetes Sister   . Diabetes Brother   . Diabetes Sister   . Diabetes Sister   . Diabetes Sister   . Diabetes Brother   . Diabetes Brother   . Diabetes Brother   . Diabetes Brother     Past Surgical History:  Procedure Laterality Date  . AMPUTATION Right 07/21/2017   Procedure: RIGHT GREAT TOE AMPUTATION;  Surgeon: 07/23/2017, MD;  Location: [facility] OR;  Service: Orthopedics;  Laterality: Right;  . CARDIAC CATHETERIZATION N/A 05/14/2015   Procedure: Left Heart Cath and Coronary Angiography;  Surgeon: 05/16/2015, MD; dLAD 60%>>95% (small vessel, ?dissection), CFX & RCA systems no sig dz, EF & LVEDP nl      Social  History   Occupational History  . Occupation: unemployed  Tobacco Use  . Smoking status: Never Smoker  . Smokeless tobacco: Never Used  Vaping Use  . Vaping Use: Never used  Substance and Sexual Activity  . Alcohol use: No  . Drug use: No  . Sexual activity: Yes

## 2020-05-22 MED FILL — NOVOLOG FLEXPEN SYRINGE: 100 | 26 days supply | Qty: 12 | Fill #2

## 2020-05-22 MED FILL — methIMAzole 5 MG TABS: 5 | 30 days supply | Qty: 12 | Fill #4

## 2020-05-22 MED FILL — ?BASAGLAR 100 UNITS/ML KWPE: 100 | 35 days supply | Qty: 12 | Fill #3

## 2020-05-22 MED FILL — ?ATORVASTATIN 40MG TABLET: 40 | 30 days supply | Qty: 30 | Fill #3

## 2020-06-03 ENCOUNTER — Telehealth (INDEPENDENT_AMBULATORY_CARE_PROVIDER_SITE_OTHER): Payer: Self-pay | Admitting: Primary Care

## 2020-06-03 NOTE — Telephone Encounter (Signed)
Patient's daughter, Soledad Gerlach is calling to schedule an [employer] appt with Mikle Bosworth- Please advise CB- [phone]

## 2020-06-03 NOTE — Telephone Encounter (Signed)
Pt was call and schedule a Financial appt on 06/14/20

## 2020-06-05 MED FILL — LOSARTAN POTASSIUM 100 MG T: 100 | 30 days supply | Qty: 30 | Fill #4

## 2020-06-14 ENCOUNTER — Ambulatory Visit: Payer: Self-pay

## 2020-06-14 ENCOUNTER — Other Ambulatory Visit: Payer: Self-pay

## 2020-06-14 ENCOUNTER — Ambulatory Visit: Payer: Self-pay | Admitting: Family

## 2020-06-14 MED FILL — LOSARTAN POTASSIUM 100 MG T: 100 | 30 days supply | Qty: 30 | Fill #4

## 2020-06-14 MED FILL — metFORMIN HCL ER 500 MG TB2: 500 | 30 days supply | Qty: 120 | Fill #1

## 2020-06-18 ENCOUNTER — Ambulatory Visit (INDEPENDENT_AMBULATORY_CARE_PROVIDER_SITE_OTHER): Payer: Self-pay

## 2020-06-18 ENCOUNTER — Ambulatory Visit (INDEPENDENT_AMBULATORY_CARE_PROVIDER_SITE_OTHER): Payer: Self-pay | Admitting: Family

## 2020-06-18 ENCOUNTER — Encounter: Payer: Self-pay | Admitting: Family

## 2020-06-18 VITALS — Ht 62.0 in | Wt 184.0 lb

## 2020-06-18 DIAGNOSIS — L97511 Non-pressure chronic ulcer of other part of right foot limited to breakdown of skin: Secondary | ICD-10-CM

## 2020-06-18 MED ORDER — SULFAMETHOXAZOLE-TRIMETHOPRIM 800-160 MG PO TABS: 1.0000 | ORAL_TABLET | Freq: Two times a day (BID) | ORAL | 0 refills | Status: DC

## 2020-06-18 MED FILL — ?BASAGLAR 100 UNITS/ML KWPE: 100 | 35 days supply | Qty: 12 | Fill #4

## 2020-06-18 MED FILL — ?BACTRIM DS 800-160 MG TABS: 20 days supply | Qty: 20 | Fill #0

## 2020-06-18 NOTE — Progress Notes (Signed)
Office Visit Note   Patient: Rebecca [name]           Date of Birth: [birth year]           MRN: [id number] Visit Date: 06/18/2020              Requested by: Grayce Sessions, NP [address] [place],  [place] [address] PCP: Grayce Sessions, NP  Chief Complaint  Patient presents with  . Right Foot - Follow-up      HPI: The patient is a 54 year old woman who presents today for routine follow-up for her [address] grade 1 ulcer beneath the second metatarsal head. Reports worsening of ulcer with increased pain, size and odor for the last 2 weeks. No change in her activities.   We have been trimming the callus every 4 weeks. Continues to have constant pain with ambulation beneath ulcer, this gradually worsens over time. Is relieved with paring of callus.   She is taking 1200 mg of gabapentin 3 times daily.  She states that she has tried Lyrica in the past  She does have a extra-depth diabetic shoe with an orthotic.   Denies fever and chills.   Assessment & Plan: Visit Diagnoses:  No diagnosis found.  Plan: She will wear her custom orthotic and protective shoewear. She will continue with her daily wound care and antibacterial ointment dressing changes. Continue to work on heel cord stretching.  Will add Bactrim. Concerned for possible osteomyelitis of second metatarsal head. Will follow up in office with duda in 2 weeks.     Follow-Up Instructions: Return in about 4 weeks (around 07/16/2020).   Ortho Exam  Patient is alert, oriented, no adenopathy, well-dressed, normal affect, normal respiratory effort. On examination bilateral lower extremity she does have some heel cord tightness with dorsiflexion to neutral.  To the right foot beneath the second and third metatarsal head she has some callus buildup with central ulceration.  This measures 2 cm in diameter. Is increased in size Centrally is 5 mm in diameter and 5 mm deep. Does not probe to bone or tendon. Scant  serous drainage. Nonviable tissue was debrided with a 10 blade knife back to viable tissue.  No erythema no tenderness no sign of infection  Imaging: No results found. No images are attached to the encounter.  Labs: Lab Results  Component Value Date   HGBA1C 11.4 (A) 04/15/2020   HGBA1C 9.3 (A) 01/11/2020   HGBA1C 11.0 (A) 10/03/2019   ESRSEDRATE 54 (H) 07/18/2017   CRP <0.8 07/18/2017   REPTSTATUS 06/27/2018 FINAL 06/22/2018   REPTSTATUS 06/27/2018 FINAL 06/22/2018   CULT  06/22/2018    NO GROWTH 5 DAYS Performed at [facility], [address], [place], [place] [address]    CULT  06/22/2018    NO GROWTH 5 DAYS Performed at [facility], [address] [place], [place] [address]    LABORGA ESCHERICHIA COLI (A) 06/20/2018     Lab Results  Component Value Date   ALBUMIN 4.5 11/14/2019   ALBUMIN 3.8 07/21/2019   ALBUMIN 4.5 06/22/2019   PREALBUMIN 26.0 07/19/2017    Lab Results  Component Value Date   MG 2.0 11/28/2018   MG 1.8 06/23/2018   MG 2.3 05/13/2015   Lab Results  Component Value Date   VD25OH 28 (L) 01/06/2010   VD25OH 23 (L) 07/02/2009    Lab Results  Component Value Date   PREALBUMIN 26.0 07/19/2017   CBC EXTENDED Latest  Ref Rng & Units 04/15/2020 11/14/2019 07/21/2019  WBC x10E3/uL CANCELED 7.5 7.6  RBC - CANCELED 4.54 4.40  HGB - CANCELED 13.0 12.1  HCT - CANCELED 37.8 36.5  PLT - CANCELED 316 239  NEUTROABS 1 - 7 x10E3/uL - 4.9 4.5  LYMPHSABS - CANCELED 1.7 2.3     Body mass index is 33.65 kg/m.  Orders:  No orders of the defined types were placed in this encounter.  No orders of the defined types were placed in this encounter.    Procedures: No procedures performed  Clinical Data: No additional findings.  ROS:  All other systems negative, except as noted in the HPI. Review of Systems  Constitutional: Negative for chills and fever.  Skin: Positive for wound. Negative for color change.    Objective: Vital  Signs: Ht 5\' 2"  (1.575 m)   Wt 184 lb (83.5 kg)   LMP  (LMP Unknown)   BMI 33.65 kg/m   Specialty Comments:  No specialty comments available.  PMFS History: Patient Active Problem List   Diagnosis Date Noted  . Ulcer of right foot, limited to breakdown of skin (HCC) 02/21/2020  . Diarrhea   . Hyperthyroidism   . Insulin dependent diabetes mellitus   . Orthostasis   . Vaginal discharge   . Infectious diarrhea 11/26/2018  . Hyperkalemia 11/26/2018  . Bacteremia 06/21/2018  . Sepsis secondary to UTI (HCC) 06/20/2018  . Right great toe amputee (HCC) 08/05/2017  . Mild renal insufficiency 07/19/2017  . AKI (acute kidney injury) (HCC) 03/04/2016  . Essential hypertension 05/14/2015  . CAD- with distal LAD disease, ? 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 05/14/2015  . NSTEMI (non-ST elevated myocardial infarction) (HCC)   . Syncope 05/13/2015  . Hyperglycemia 10/25/2014  . Abrasion of thigh, left 10/25/2014  . DM (diabetes mellitus), type 2, uncontrolled (HCC)   . DM hyperosmolar coma, type 2 (HCC) 05/22/2012  . Hyperosmolar syndrome 05/21/2012   Past Medical History:  Diagnosis Date  . Anemia   . Diabetes mellitus   . Dyslipidemia   . Hypertension   . Non-STEMI (non-ST elevated myocardial infarction) [facility])    Medical therapy for distal LAD dissection  . Thyroid disease     Family History  Problem Relation Age of Onset  . Hypertension Father   . Hyperlipidemia Father   . Cancer Mother   . Diabetes Sister   . Diabetes Brother   . Diabetes Sister   . Diabetes Sister   . Diabetes Sister   . Diabetes Brother   . Diabetes Brother   . Diabetes Brother   . Diabetes Brother     Past Surgical History:  Procedure Laterality Date  . AMPUTATION Right 07/21/2017   Procedure: RIGHT GREAT TOE AMPUTATION;  Surgeon: 07/23/2017, MD;  Location: [facility] OR;  Service: Orthopedics;  Laterality: Right;  . CARDIAC CATHETERIZATION N/A 05/14/2015   Procedure: Left Heart Cath and Coronary Angiography;   Surgeon: 05/16/2015, MD; dLAD 60%>>95% (small vessel, ?dissection), CFX & RCA systems no sig dz, EF & LVEDP nl      Social History   Occupational History  . Occupation: unemployed  Tobacco Use  . Smoking status: Never Smoker  . Smokeless tobacco: Never Used  Vaping Use  . Vaping Use: Never used  Substance and Sexual Activity  . Alcohol use: No  . Drug use: No  . Sexual activity: Yes

## 2020-06-21 ENCOUNTER — Other Ambulatory Visit (INDEPENDENT_AMBULATORY_CARE_PROVIDER_SITE_OTHER): Payer: Self-pay | Admitting: Primary Care

## 2020-06-21 DIAGNOSIS — E1165 Type 2 diabetes mellitus with hyperglycemia: Secondary | ICD-10-CM

## 2020-06-21 DIAGNOSIS — Z760 Encounter for issue of repeat prescription: Secondary | ICD-10-CM

## 2020-06-21 MED ORDER — GABAPENTIN 600 MG PO TABS: ORAL_TABLET | ORAL | 0 refills | Status: DC

## 2020-06-21 MED FILL — GABAPENTIN 600 MG TABLET: 600 | 30 days supply | Qty: 180 | Fill #0

## 2020-06-21 MED FILL — JANUVIA 100 MG TABLET: 100 | 30 days supply | Qty: 30 | Fill #1

## 2020-06-21 MED FILL — !NOVOLOG FLEXPEN SYRINGE 1: 100/ML | 19 days supply | Qty: 9 | Fill #0

## 2020-06-21 NOTE — Telephone Encounter (Signed)
PT need a refill  gabapentin (NEURONTIN) 600 MG tablet [[id number]]  [facility] - [place], [place] - [place] [address]  [address] [place] [address]  Phone: [phone] Fax: [phone]

## 2020-06-21 NOTE — Telephone Encounter (Signed)
Requested Prescriptions  Pending Prescriptions Disp Refills   gabapentin (NEURONTIN) 600 MG tablet 180 tablet 0    Sig: TAKE 2 TABLETS BY MOUTH 3 TIMES DAILY.     Neurology: Anticonvulsants - gabapentin Passed - 06/21/2020 10:19 AM      Passed - Valid encounter within last 12 months    Recent Outpatient Visits          2 months ago Uncontrolled type 2 diabetes mellitus with hyperglycemia (HCC)   [facility] Gwinda Passe P, NP   4 months ago Uncontrolled type 2 diabetes mellitus with hyperglycemia (HCC)   [facility] Gwinda Passe P, NP   4 months ago Uncontrolled type 2 diabetes mellitus with hyperglycemia (HCC)   [facility] Gwinda Passe P, NP   5 months ago Uncontrolled type 2 diabetes mellitus with hyperglycemia (HCC)   [facility] Gwinda Passe P, NP   5 months ago Uncontrolled type 2 diabetes mellitus with hyperglycemia (HCC)   [facility] Grayce Sessions, NP      Future Appointments            In 1 week Nadara Mustard, MD [facility]   In 3 weeks Grayce Sessions, NP [facility]

## 2020-06-21 NOTE — Addendum Note (Signed)
Addended by: Elby Beck F on: 06/21/2020 10:19 AM   Modules accepted: Orders

## 2020-07-01 ENCOUNTER — Encounter: Payer: Self-pay | Admitting: Orthopedic Surgery

## 2020-07-01 ENCOUNTER — Ambulatory Visit (INDEPENDENT_AMBULATORY_CARE_PROVIDER_SITE_OTHER): Payer: Self-pay | Admitting: Orthopedic Surgery

## 2020-07-01 ENCOUNTER — Ambulatory Visit: Payer: Self-pay

## 2020-07-01 VITALS — Ht 62.0 in | Wt 184.0 lb

## 2020-07-01 DIAGNOSIS — M79671 Pain in right foot: Secondary | ICD-10-CM

## 2020-07-01 DIAGNOSIS — M86271 Subacute osteomyelitis, right ankle and foot: Secondary | ICD-10-CM

## 2020-07-01 MED FILL — GABAPENTIN 600 MG TABLET: 600 | 30 days supply | Qty: 180 | Fill #0

## 2020-07-01 MED FILL — JANUVIA 100 MG TABLET: 100 | 30 days supply | Qty: 30 | Fill #1

## 2020-07-01 MED FILL — ?ATORVASTATIN 40MG TABLET: 40 | 30 days supply | Qty: 30 | Fill #4

## 2020-07-01 MED FILL — methIMAzole 5 MG TABS: 5 | 30 days supply | Qty: 12 | Fill #5

## 2020-07-01 MED FILL — !NOVOLOG FLEXPEN SYRINGE 1: 100/ML | 19 days supply | Qty: 9 | Fill #0

## 2020-07-01 NOTE — Progress Notes (Signed)
Office Visit Note   Patient: Rebecca [name]           Date of Birth: 02-Oct-1965           MRN: [id number] Visit Date: 07/01/2020              Requested by: Grayce Sessions, NP [address] [place],  [place] [address] PCP: Grayce Sessions, NP  Chief Complaint  Patient presents with  . 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  Patient complains of drainage from the ulcer beneath the second metatarsal head she has completed her course of antibiotics.  Assessment & Plan: Visit Diagnoses:  1. Pain in right foot   2. Subacute osteomyelitis of right foot (HCC)     Plan: Patient has osteomyelitis of the second metatarsal head.  Will plan for amputation of the second ray outpatient surgery on Friday risks and benefits were discussed including risk of the wound not healing need for additional surgery.  Patient states she understands wished to proceed at this time patient was seen with a third-party interpreter.  Follow-Up Instructions: No follow-ups on file.   Ortho Exam  Patient is alert, oriented, no adenopathy, well-dressed, normal affect, normal respiratory effort. Examination patient has a good dorsalis pedis pulse there is no ascending cellulitis there is no purulent drainage patient has an ulcer that probes circumferentially around the second metatarsal head the ulcer is 1 cm deep and 5 mm in diameter.  Patient has a Wagner grade 3 ulcer.  Imaging: XR Foot Complete Right  Result Date: 07/01/2020 Three-view radiographs of the right foot shows previous great toe amputation.  Patient has a prominent second metatarsal head and the silver nitrate is circumferentially around 92nd metatarsal head.  No images are attached to the encounter.  Labs: Lab Results  Component Value Date   HGBA1C 11.4 (A) 04/15/2020   HGBA1C 9.3 (A)  01/11/2020   HGBA1C 11.0 (A) 10/03/2019   ESRSEDRATE 54 (H) 07/18/2017   CRP <0.8 07/18/2017   REPTSTATUS 06/27/2018 FINAL 06/22/2018   REPTSTATUS 06/27/2018 FINAL 06/22/2018   CULT  06/22/2018    NO GROWTH 5 DAYS Performed at [facility], [address], [place], [place] [address]    CULT  06/22/2018    NO GROWTH 5 DAYS Performed at [facility], [address], [place], [place] [address]    LABORGA ESCHERICHIA COLI (A) 06/20/2018     Lab Results  Component Value Date   ALBUMIN 4.5 11/14/2019   ALBUMIN 3.8 07/21/2019   ALBUMIN 4.5 06/22/2019   PREALBUMIN 26.0 07/19/2017    Lab Results  Component Value Date   MG 2.0 11/28/2018   MG 1.8 06/23/2018   MG 2.3 05/13/2015   Lab Results  Component Value Date   VD25OH 28 (L) 01/06/2010   VD25OH 23 (L) 07/02/2009    Lab Results  Component Value Date   PREALBUMIN 26.0 07/19/2017   CBC EXTENDED Latest Ref Rng & Units 04/15/2020 11/14/2019 07/21/2019  WBC x10E3/uL CANCELED 7.5 7.6  RBC - CANCELED 4.54 4.40  HGB - CANCELED 13.0 12.1  HCT - CANCELED 37.8 36.5  PLT - CANCELED 316 239  NEUTROABS 1 - 7 x10E3/uL - 4.9 4.5  LYMPHSABS - CANCELED 1.7 2.3     Body mass index is 33.65 kg/m.  Orders:  Orders Placed This Encounter  Procedures  . XR Foot Complete Right   No orders of the defined types were placed in this encounter.    Procedures: No procedures performed  Clinical Data: No additional findings.  ROS:  All other systems negative, except as noted in the HPI. Review of Systems  Objective: Vital Signs: Ht 5\' 2"  (1.575 m)   Wt 184 lb (83.5 kg)   LMP  (LMP Unknown)   BMI 33.65 kg/m   Specialty Comments:  No specialty comments available.  PMFS History: Patient Active Problem List   Diagnosis Date Noted  . Ulcer of right foot, limited to breakdown of skin (HCC) 02/21/2020  . Diarrhea   . Hyperthyroidism   . Insulin dependent diabetes mellitus   . Orthostasis   . Vaginal discharge     . Infectious diarrhea 11/26/2018  . Hyperkalemia 11/26/2018  . Bacteremia 06/21/2018  . Sepsis secondary to UTI (HCC) 06/20/2018  . Right great toe amputee (HCC) 08/05/2017  . Mild renal insufficiency 07/19/2017  . AKI (acute kidney injury) (HCC) 03/04/2016  . Essential hypertension 05/14/2015  . CAD- with distal LAD disease, ? 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 05/14/2015  . NSTEMI (non-ST elevated myocardial infarction) (HCC)   . Syncope 05/13/2015  . Hyperglycemia 10/25/2014  . Abrasion of thigh, left 10/25/2014  . DM (diabetes mellitus), type 2, uncontrolled (HCC)   . DM hyperosmolar coma, type 2 (HCC) 05/22/2012  . Hyperosmolar syndrome 05/21/2012   Past Medical History:  Diagnosis Date  . Anemia   . Diabetes mellitus   . Dyslipidemia   . Hypertension   . Non-STEMI (non-ST elevated myocardial infarction) [facility])    Medical therapy for distal LAD dissection  . Thyroid disease     Family History  Problem Relation Age of Onset  . Hypertension Father   . Hyperlipidemia Father   . Cancer Mother   . Diabetes Sister   . Diabetes Brother   . Diabetes Sister   . Diabetes Sister   . Diabetes Sister   . Diabetes Brother   . Diabetes Brother   . Diabetes Brother   . Diabetes Brother     Past Surgical History:  Procedure Laterality Date  . AMPUTATION Right 07/21/2017   Procedure: RIGHT GREAT TOE AMPUTATION;  Surgeon: 07/23/2017, MD;  Location: [facility] OR;  Service: Orthopedics;  Laterality: Right;  . CARDIAC CATHETERIZATION N/A 05/14/2015   Procedure: Left Heart Cath and Coronary Angiography;  Surgeon: 05/16/2015, MD; dLAD 60%>>95% (small vessel, ?dissection), CFX & RCA systems no sig dz, EF & LVEDP nl      Social History   Occupational History  . Occupation: unemployed  Tobacco Use  . Smoking status: Never Smoker  . Smokeless tobacco: Never Used  Vaping Use  . Vaping Use: Never used  Substance and Sexual Activity  . Alcohol use: No  . Drug use: No  . Sexual activity: Yes

## 2020-07-02 ENCOUNTER — Telehealth: Payer: Self-pay | Admitting: Orthopedic Surgery

## 2020-07-02 ENCOUNTER — Other Ambulatory Visit: Payer: Self-pay | Admitting: Physician Assistant

## 2020-07-02 ENCOUNTER — Ambulatory Visit (INDEPENDENT_AMBULATORY_CARE_PROVIDER_SITE_OTHER): Payer: Self-pay | Admitting: Orthopedic Surgery

## 2020-07-02 ENCOUNTER — Encounter: Payer: Self-pay | Admitting: Orthopedic Surgery

## 2020-07-02 DIAGNOSIS — L97511 Non-pressure chronic ulcer of other part of right foot limited to breakdown of skin: Secondary | ICD-10-CM

## 2020-07-02 MED ORDER — DOXYCYCLINE HYCLATE 100 MG PO TABS: 100.0000 mg | ORAL_TABLET | Freq: Two times a day (BID) | ORAL | 0 refills | Status: DC

## 2020-07-02 MED FILL — DOXYCYCLINE HYCLATE 100 MG: 100 | 30 days supply | Qty: 60 | Fill #0

## 2020-07-02 NOTE — Telephone Encounter (Signed)
Please advise 

## 2020-07-02 NOTE — Progress Notes (Signed)
Office Visit Note   Patient: Rebecca [name]           Date of Birth: Oct 01, 1965           MRN: [id number] Visit Date: 07/02/2020              Requested by: Grayce Sessions, NP [address] [place],  [place] [address] PCP: Grayce Sessions, NP  Chief Complaint  Patient presents with  . Right Foot - Follow-up      HPI: Patient presents today for an unscheduled visit.  She is scheduled for a second ray amputation on her right foot on Friday.  Her daughter was concerned earlier today because she seemed to call her and told her she had fever and chills and just did not feel well.  Assessment & Plan: Visit Diagnoses: No diagnosis found.  Plan: Interpretation was done by Marisue Ivan.  Patient will go on doxycycline until Friday.  She understands if she has any increasing fever chills or malaise she is to go the emergency room so IV antibiotics could be started.  She is in agreement with this plan.  Doxycycline was called in for her  Follow-Up Instructions: No follow-ups on file.   Ortho Exam  Patient is alert, oriented, no adenopathy, well-dressed, normal affect, normal respiratory effort. Right foot open deep ulcer under the second metatarsal head there is no surrounding cellulitis there is no ascending cellulitis mild amount of soft tissue swelling no redness in her calf.  Patient overall appears comfortable and well  Imaging: No results found. No images are attached to the encounter.  Labs: Lab Results  Component Value Date   HGBA1C 11.4 (A) 04/15/2020   HGBA1C 9.3 (A) 01/11/2020   HGBA1C 11.0 (A) 10/03/2019   ESRSEDRATE 54 (H) 07/18/2017   CRP <0.8 07/18/2017   REPTSTATUS 06/27/2018 FINAL 06/22/2018   REPTSTATUS 06/27/2018 FINAL 06/22/2018   CULT  06/22/2018    NO GROWTH 5 DAYS Performed at [facility], [address], [place], [place] [address]    CULT  06/22/2018    NO GROWTH 5 DAYS Performed at [facility], [address],  [place], [place] [address]    LABORGA ESCHERICHIA COLI (A) 06/20/2018     Lab Results  Component Value Date   ALBUMIN 4.5 11/14/2019   ALBUMIN 3.8 07/21/2019   ALBUMIN 4.5 06/22/2019   PREALBUMIN 26.0 07/19/2017    Lab Results  Component Value Date   MG 2.0 11/28/2018   MG 1.8 06/23/2018   MG 2.3 05/13/2015   Lab Results  Component Value Date   VD25OH 28 (L) 01/06/2010   VD25OH 23 (L) 07/02/2009    Lab Results  Component Value Date   PREALBUMIN 26.0 07/19/2017   CBC EXTENDED Latest Ref Rng & Units 04/15/2020 11/14/2019 07/21/2019  WBC x10E3/uL CANCELED 7.5 7.6  RBC - CANCELED 4.54 4.40  HGB - CANCELED 13.0 12.1  HCT - CANCELED 37.8 36.5  PLT - CANCELED 316 239  NEUTROABS 1 - 7 x10E3/uL - 4.9 4.5  LYMPHSABS - CANCELED 1.7 2.3     There is no height or weight on file to calculate BMI.  Orders:  No orders of the defined types were placed in this encounter.  Meds ordered this encounter  Medications  . doxycycline (VIBRA-TABS) 100 MG tablet    Sig: Take 1 tablet (100 mg total) by mouth 2 (two) times daily.    Dispense:  60 tablet    Refill:  0     Procedures: No procedures performed  Clinical Data: No additional findings.  ROS:  All other systems negative, except as noted in the HPI. Review of Systems  Objective: Vital Signs: LMP  (LMP Unknown)   Specialty Comments:  No specialty comments available.  PMFS History: Patient Active Problem List   Diagnosis Date Noted  . Ulcer of right foot, limited to breakdown of skin (HCC) 02/21/2020  . Diarrhea   . Hyperthyroidism   . Insulin dependent diabetes mellitus   . Orthostasis   . Vaginal discharge   . Infectious diarrhea 11/26/2018  . Hyperkalemia 11/26/2018  . Bacteremia 06/21/2018  . Sepsis secondary to UTI (HCC) 06/20/2018  . Right great toe amputee (HCC) 08/05/2017  . Mild renal insufficiency 07/19/2017  . AKI (acute kidney injury) (HCC) 03/04/2016  . Essential hypertension 05/14/2015  . CAD-  with distal LAD disease, ? 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 05/14/2015  . NSTEMI (non-ST elevated myocardial infarction) (HCC)   . Syncope 05/13/2015  . Hyperglycemia 10/25/2014  . Abrasion of thigh, left 10/25/2014  . DM (diabetes mellitus), type 2, uncontrolled (HCC)   . DM hyperosmolar coma, type 2 (HCC) 05/22/2012  . Hyperosmolar syndrome 05/21/2012   Past Medical History:  Diagnosis Date  . Anemia   . Diabetes mellitus   . Dyslipidemia   . Hypertension   . Non-STEMI (non-ST elevated myocardial infarction) [facility])    Medical therapy for distal LAD dissection  . Thyroid disease     Family History  Problem Relation Age of Onset  . Hypertension Father   . Hyperlipidemia Father   . Cancer Mother   . Diabetes Sister   . Diabetes Brother   . Diabetes Sister   . Diabetes Sister   . Diabetes Sister   . Diabetes Brother   . Diabetes Brother   . Diabetes Brother   . Diabetes Brother     Past Surgical History:  Procedure Laterality Date  . AMPUTATION Right 07/21/2017   Procedure: RIGHT GREAT TOE AMPUTATION;  Surgeon: Nadara Mustard, MD;  Location: [facility] OR;  Service: Orthopedics;  Laterality: Right;  . CARDIAC CATHETERIZATION N/A 05/14/2015   Procedure: Left Heart Cath and Coronary Angiography;  Surgeon: Marykay Lex, MD; dLAD 60%>>95% (small vessel, ?dissection), CFX & RCA systems no sig dz, EF & LVEDP nl      Social History   Occupational History  . Occupation: unemployed  Tobacco Use  . Smoking status: Never Smoker  . Smokeless tobacco: Never Used  Vaping Use  . Vaping Use: Never used  Substance and Sexual Activity  . Alcohol use: No  . Drug use: No  . Sexual activity: Yes

## 2020-07-02 NOTE — Telephone Encounter (Signed)
Patient's daughter Francesco Runner  called advised her mother is not feeling well at all. Leyla said her mother said her foot was hurting her real bad and she is having chills and running a fever. Francesco Runner said her mother was in the office yesterday to have the wound on the bottom of her foot cleaned out.  Leyda asked what does she need to do for her mother. Francesco Runner said her mother is at home alone right now.  The number to contact Francesco Runner is [phone]

## 2020-07-02 NOTE — Progress Notes (Signed)
-------------  SDW INSTRUCTIONS:  Your procedure is scheduled on Friday 10/15.  Report to [facility] Main Entrance "A" at 05:30 A.M., and check in at the Admitting office.  Call this number if you have problems the morning of surgery: [phone]   Remember: Do not eat after midnight the night before your surgery  You may drink clear liquids until 04:30 A.M the morning of your surgery.   Clear liquids allowed are: Water, Non-Citrus Juices (without pulp), Carbonated Beverages, Clear Tea, Black Coffee Only, and Gatorade   Take these medicines the morning of surgery with A SIP OF WATER: doxycycline (VIBRA-TABS)  gabapentin (NEURONTIN)  nitroGLYCERIN (NITROSTAT) - if needed   insulin aspart (NOVOLOG) Day before surgery - take usual dose, no bedtime dose Day of surgery - NONE - if CBG >220, take half of usual correction dose  Insulin Glargine (BASAGLAR KWIKPEN)  Day before surgery - take AM dose as usual, take 17 units for PM dose Day of surgery - take 17 units   sitaGLIPtin (JANUVIA) Day before surgery - take usual dose Day of surgery - none  ** PLEASE check your blood sugar the morning of your surgery when you wake up and every 2 hours until you get to the Short Stay unit.  If your blood sugar is less than 70 mg/dL, you will need to treat for low blood sugar: - Do not take insulin. - Treat a low blood sugar (less than 70 mg/dL) with  cup of clear juice (cranberry or apple), 4 glucose tablets, OR glucose gel. - Recheck blood sugar in 15 minutes after treatment (to make sure it is greater than 70 mg/dL). If your blood sugar is not greater than 70 mg/dL on recheck, call [phone] for further instructions.  Follow your surgeon's instructions on when to stop Aspirin.  If no instructions were given by your surgeon then you will need to call the office to get those instructions.    As of today, STOP taking any Aspirin (unless otherwise instructed by your surgeon), Aleve,  Naproxen, Ibuprofen, Motrin, Advil, Goody's, BC's, all herbal medications, fish oil, and all vitamins.    The Morning of Surgery  Do not wear jewelry, make-up or nail polish.  Do not wear lotions, powders, or perfumes, or deodorant  Do not shave 48 hours prior to surgery.   Do not bring valuables to the hospital.  [facility] is not responsible for any belongings or valuables.  If you are a smoker, DO NOT Smoke 24 hours prior to surgery  If you wear a CPAP at night please bring your mask the morning of surgery   Remember that you must have someone to transport you home after your surgery, and remain with you for 24 hours if you are discharged the same day.   Please bring cases for contacts, glasses, hearing aids, dentures or bridgework because it cannot be worn into surgery.    Leave your suitcase in the car.  After surgery it may be brought to your room.  For patients admitted to the hospital, discharge time will be determined by your treatment team.  Patients discharged the day of surgery will not be allowed to drive home.    Special instructions:   [facility]- Preparing For Surgery  Oral Hygiene is also important to reduce your risk of infection.  Remember - BRUSH YOUR TEETH THE MORNING OF SURGERY WITH YOUR REGULAR TOOTHPASTE  Please follow these instructions carefully.   1. Shower the NIGHT BEFORE SURGERY and  the MORNING OF SURGERY with DIAL Soap.   2. Wash thoroughly, paying special attention to the area where your surgery will be performed.  3. Thoroughly rinse your body with warm water from the neck down.  4. Pat yourself dry with a CLEAN TOWEL.  5. Wear CLEAN PAJAMAS to bed the night before surgery  6. Place CLEAN SHEETS on your bed the night of your first shower and DO NOT SLEEP WITH PETS.  7. Wear comfortable clothes the morning of surgery.    Day of Surgery:  Please shower the morning of surgery with the CHG soap Do not apply any  deodorants/lotions. Please wear clean clothes to the hospital/surgery center.   Remember to brush your teeth WITH YOUR REGULAR TOOTHPASTE.   Please read over the following fact sheets that you were given.  Patient denies shortness of breath, fever, cough and chest pain.

## 2020-07-02 NOTE — Telephone Encounter (Signed)
I spoke with the daughter by phone.  She is going to go and check on her mother.  If she indeed has fever and chills and really does not look well I advise she take her to the emergency room.  If she looks okay and just seems to have more pain in the foot I told her that we may be able to see her in the office.  Either way she is going to call us back and let us know

## 2020-07-03 ENCOUNTER — Other Ambulatory Visit (HOSPITAL_COMMUNITY)
Admission: RE | Admit: 2020-07-03 | Discharge: 2020-07-03 | Disposition: A | Discharge status: Home / Self Care | Payer: HRSA Program | Source: Ambulatory Visit | Attending: Orthopedic Surgery | Admitting: Orthopedic Surgery

## 2020-07-03 DIAGNOSIS — Z20822 Contact with and (suspected) exposure to covid-19: Secondary | ICD-10-CM | POA: Diagnosis not present

## 2020-07-03 DIAGNOSIS — Z01812 Encounter for preprocedural laboratory examination: Secondary | ICD-10-CM | POA: Diagnosis present

## 2020-07-03 LAB — SARS CORONAVIRUS 2 (TAT 6-24 HRS): SARS Coronavirus 2: NEGATIVE

## 2020-07-05 ENCOUNTER — Other Ambulatory Visit: Payer: Self-pay | Admitting: Physician Assistant

## 2020-07-05 ENCOUNTER — Ambulatory Visit (HOSPITAL_COMMUNITY): Payer: Self-pay | Admitting: Certified Registered"

## 2020-07-05 ENCOUNTER — Encounter (HOSPITAL_COMMUNITY): Payer: Self-pay | Admitting: Orthopedic Surgery

## 2020-07-05 ENCOUNTER — Other Ambulatory Visit: Payer: Self-pay

## 2020-07-05 ENCOUNTER — Encounter (HOSPITAL_COMMUNITY)
Admission: RE | Disposition: A | Discharge status: Home / Self Care | Payer: Self-pay | Source: Home / Self Care | Attending: Orthopedic Surgery

## 2020-07-05 ENCOUNTER — Ambulatory Visit (HOSPITAL_COMMUNITY)
Admission: RE | Admit: 2020-07-05 | Discharge: 2020-07-05 | Disposition: A | Discharge status: Home / Self Care | Payer: Self-pay | Attending: Orthopedic Surgery | Admitting: Orthopedic Surgery

## 2020-07-05 DIAGNOSIS — E785 Hyperlipidemia, unspecified: Secondary | ICD-10-CM | POA: Insufficient documentation

## 2020-07-05 DIAGNOSIS — I2510 Atherosclerotic heart disease of native coronary artery without angina pectoris: Secondary | ICD-10-CM | POA: Insufficient documentation

## 2020-07-05 DIAGNOSIS — M86271 Subacute osteomyelitis, right ankle and foot: Secondary | ICD-10-CM | POA: Insufficient documentation

## 2020-07-05 DIAGNOSIS — I10 Essential (primary) hypertension: Secondary | ICD-10-CM | POA: Insufficient documentation

## 2020-07-05 DIAGNOSIS — Z7982 Long term (current) use of aspirin: Secondary | ICD-10-CM | POA: Insufficient documentation

## 2020-07-05 DIAGNOSIS — Z794 Long term (current) use of insulin: Secondary | ICD-10-CM | POA: Insufficient documentation

## 2020-07-05 DIAGNOSIS — E11621 Type 2 diabetes mellitus with foot ulcer: Secondary | ICD-10-CM | POA: Insufficient documentation

## 2020-07-05 DIAGNOSIS — E1169 Type 2 diabetes mellitus with other specified complication: Secondary | ICD-10-CM | POA: Insufficient documentation

## 2020-07-05 DIAGNOSIS — Z79899 Other long term (current) drug therapy: Secondary | ICD-10-CM | POA: Insufficient documentation

## 2020-07-05 DIAGNOSIS — M869 Osteomyelitis, unspecified: Secondary | ICD-10-CM

## 2020-07-05 DIAGNOSIS — L97518 Non-pressure chronic ulcer of other part of right foot with other specified severity: Secondary | ICD-10-CM | POA: Insufficient documentation

## 2020-07-05 DIAGNOSIS — E0590 Thyrotoxicosis, unspecified without thyrotoxic crisis or storm: Secondary | ICD-10-CM | POA: Insufficient documentation

## 2020-07-05 DIAGNOSIS — I252 Old myocardial infarction: Secondary | ICD-10-CM | POA: Insufficient documentation

## 2020-07-05 HISTORY — PX: AMPUTATION: SHX166

## 2020-07-05 LAB — CBC
HCT: 34.8 % — ABNORMAL LOW (ref 36.0–46.0)
Hemoglobin: 11.3 g/dL — ABNORMAL LOW (ref 12.0–15.0)
MCH: 28.2 pg (ref 26.0–34.0)
MCHC: 32.5 g/dL (ref 30.0–36.0)
MCV: 86.8 fL (ref 80.0–100.0)
Platelets: 328 10*3/uL (ref 150–400)
RBC: 4.01 MIL/uL (ref 3.87–5.11)
RDW: 12.5 % (ref 11.5–15.5)
WBC: 8 10*3/uL (ref 4.0–10.5)
nRBC: 0 % (ref 0.0–0.2)

## 2020-07-05 LAB — BASIC METABOLIC PANEL
Anion gap: 10 (ref 5–15)
BUN: 33 mg/dL — ABNORMAL HIGH (ref 6–20)
CO2: 22 mmol/L (ref 22–32)
Calcium: 9.4 mg/dL (ref 8.9–10.3)
Chloride: 106 mmol/L (ref 98–111)
Creatinine, Ser: 1.21 mg/dL — ABNORMAL HIGH (ref 0.44–1.00)
GFR, Estimated: 51 mL/min — ABNORMAL LOW (ref >60)
Glucose, Bld: 195 mg/dL — ABNORMAL HIGH (ref 70–99)
Potassium: 4.7 mmol/L (ref 3.5–5.1)
Sodium: 138 mmol/L (ref 135–145)

## 2020-07-05 LAB — GLUCOSE, CAPILLARY
Glucose-Capillary: 193 mg/dL — ABNORMAL HIGH (ref 70–99)
Glucose-Capillary: 208 mg/dL — ABNORMAL HIGH (ref 70–99)

## 2020-07-05 SURGERY — AMPUTATION, FOOT, RAY
Anesthesia: General | Laterality: Right

## 2020-07-05 MED ORDER — CEFAZOLIN SODIUM-DEXTROSE 2-4 GM/100ML-% IV SOLN
2.0000 g | INTRAVENOUS | Status: AC
  Administered 2020-07-05: 2 g via INTRAVENOUS
  Filled 2020-07-05: qty 100

## 2020-07-05 MED ORDER — FENTANYL CITRATE (PF) 250 MCG/5ML IJ SOLN
INTRAMUSCULAR | Status: AC
  Filled 2020-07-05: qty 5

## 2020-07-05 MED ORDER — FENTANYL CITRATE (PF) 100 MCG/2ML IJ SOLN
INTRAMUSCULAR | Status: DC | PRN
Start: 2020-07-05 — End: 2020-07-05
  Administered 2020-07-05: 50 ug via INTRAVENOUS

## 2020-07-05 MED ORDER — PROPOFOL 10 MG/ML IV BOLUS
INTRAVENOUS | Status: AC
  Filled 2020-07-05: qty 40

## 2020-07-05 MED ORDER — DEXAMETHASONE SODIUM PHOSPHATE 10 MG/ML IJ SOLN
INTRAMUSCULAR | Status: DC | PRN
  Administered 2020-07-05: 5 mg via INTRAVENOUS

## 2020-07-05 MED ORDER — DIPHENHYDRAMINE HCL 50 MG/ML IJ SOLN
INTRAMUSCULAR | Status: DC | PRN
  Administered 2020-07-05: 12.5 mg via INTRAVENOUS

## 2020-07-05 MED ORDER — LACTATED RINGERS IV SOLN: INTRAVENOUS | Status: DC

## 2020-07-05 MED ORDER — ONDANSETRON HCL 4 MG/2ML IJ SOLN
INTRAMUSCULAR | Status: AC
  Filled 2020-07-05: qty 4

## 2020-07-05 MED ORDER — PHENYLEPHRINE 40 MCG/ML (10ML) SYRINGE FOR IV PUSH (FOR BLOOD PRESSURE SUPPORT)
PREFILLED_SYRINGE | INTRAVENOUS | Status: DC | PRN
  Administered 2020-07-05: 80 ug via INTRAVENOUS
  Administered 2020-07-05: 120 ug via INTRAVENOUS
  Administered 2020-07-05 (×2): 80 ug via INTRAVENOUS

## 2020-07-05 MED ORDER — MIDAZOLAM HCL 2 MG/2ML IJ SOLN
INTRAMUSCULAR | Status: AC
  Filled 2020-07-05: qty 2

## 2020-07-05 MED ORDER — ONDANSETRON HCL 4 MG/2ML IJ SOLN
INTRAMUSCULAR | Status: DC | PRN
  Administered 2020-07-05 (×2): 4 mg via INTRAVENOUS

## 2020-07-05 MED ORDER — CHLORHEXIDINE GLUCONATE 0.12 % MT SOLN
15.0000 mL | Freq: Once | OROMUCOSAL | Status: AC
  Administered 2020-07-05: 15 mL via OROMUCOSAL
  Filled 2020-07-05: qty 15

## 2020-07-05 MED ORDER — LIDOCAINE 2% (20 MG/ML) 5 ML SYRINGE
INTRAMUSCULAR | Status: DC | PRN
  Administered 2020-07-05: 80 mg via INTRAVENOUS

## 2020-07-05 MED ORDER — DEXAMETHASONE SODIUM PHOSPHATE 10 MG/ML IJ SOLN
INTRAMUSCULAR | Status: AC
  Filled 2020-07-05: qty 1

## 2020-07-05 MED ORDER — MIDAZOLAM HCL 5 MG/5ML IJ SOLN
INTRAMUSCULAR | Status: DC | PRN
  Administered 2020-07-05: 2 mg via INTRAVENOUS

## 2020-07-05 MED ORDER — 0.9 % SODIUM CHLORIDE (POUR BTL) OPTIME
TOPICAL | Status: DC | PRN
  Administered 2020-07-05: 1000 mL

## 2020-07-05 MED ORDER — ORAL CARE MOUTH RINSE: 15.0000 mL | Freq: Once | OROMUCOSAL | Status: AC

## 2020-07-05 MED ORDER — DIPHENHYDRAMINE HCL 50 MG/ML IJ SOLN
INTRAMUSCULAR | Status: AC
  Filled 2020-07-05: qty 1

## 2020-07-05 MED ORDER — PROPOFOL 10 MG/ML IV BOLUS
INTRAVENOUS | Status: DC | PRN
  Administered 2020-07-05: 180 mg via INTRAVENOUS

## 2020-07-05 MED ORDER — OXYCODONE-ACETAMINOPHEN 5-325 MG PO TABS: 1.0000 | ORAL_TABLET | ORAL | 0 refills | Status: DC | PRN

## 2020-07-05 MED ORDER — OXYCODONE-ACETAMINOPHEN 5-325 MG PO TABS
1.0000 | ORAL_TABLET | ORAL | 0 refills | Status: DC | PRN
Start: 2020-07-05 — End: 2020-07-31

## 2020-07-05 MED ORDER — HYDROMORPHONE HCL 1 MG/ML IJ SOLN: 0.2500 mg | INTRAMUSCULAR | Status: DC | PRN

## 2020-07-05 MED ORDER — PHENYLEPHRINE 40 MCG/ML (10ML) SYRINGE FOR IV PUSH (FOR BLOOD PRESSURE SUPPORT)
PREFILLED_SYRINGE | INTRAVENOUS | Status: AC
  Filled 2020-07-05: qty 20

## 2020-07-05 SURGICAL SUPPLY — 26 items
BLADE SURG 21 STRL SS (BLADE) ×3 IMPLANT
BNDG COHESIVE 4X5 TAN STRL (GAUZE/BANDAGES/DRESSINGS) ×3 IMPLANT
BNDG GAUZE ELAST 4 BULKY (GAUZE/BANDAGES/DRESSINGS) ×2 IMPLANT
COVER SURGICAL LIGHT HANDLE (MISCELLANEOUS) ×4 IMPLANT
DRAPE U-SHAPE 47X51 STRL (DRAPES) ×6 IMPLANT
DRSG EMULSION OIL 3X3 NADH (GAUZE/BANDAGES/DRESSINGS) ×2 IMPLANT
DURAPREP 26ML APPLICATOR (WOUND CARE) ×3 IMPLANT
ELECT REM PT RETURN 9FT ADLT (ELECTROSURGICAL) ×3
ELECTRODE REM PT RTRN 9FT ADLT (ELECTROSURGICAL) ×1 IMPLANT
GAUZE SPONGE 4X4 12PLY STRL (GAUZE/BANDAGES/DRESSINGS) ×2 IMPLANT
GLOVE BIOGEL PI IND STRL 9 (GLOVE) ×1 IMPLANT
GLOVE BIOGEL PI INDICATOR 9 (GLOVE) ×2
GLOVE SURG ORTHO 9.0 STRL STRW (GLOVE) ×3 IMPLANT
GOWN STRL REUS W/ TWL XL LVL3 (GOWN DISPOSABLE) ×2 IMPLANT
GOWN STRL REUS W/TWL XL LVL3 (GOWN DISPOSABLE) ×6
KIT BASIN OR (CUSTOM PROCEDURE TRAY) ×3 IMPLANT
KIT TURNOVER KIT B (KITS) ×3 IMPLANT
NS IRRIG 1000ML POUR BTL (IV SOLUTION) ×3 IMPLANT
PACK ORTHO EXTREMITY (CUSTOM PROCEDURE TRAY) ×3 IMPLANT
PAD ARMBOARD 7.5X6 YLW CONV (MISCELLANEOUS) ×6 IMPLANT
STOCKINETTE IMPERVIOUS LG (DRAPES) IMPLANT
SUT ETHILON 2 0 PSLX (SUTURE) ×3 IMPLANT
TOWEL GREEN STERILE (TOWEL DISPOSABLE) ×3 IMPLANT
TUBE CONNECTING 12'X1/4 (SUCTIONS) ×1
TUBE CONNECTING 12X1/4 (SUCTIONS) ×2 IMPLANT
YANKAUER SUCT BULB TIP NO VENT (SUCTIONS) ×3 IMPLANT

## 2020-07-05 NOTE — Transfer of Care (Signed)
Immediate Anesthesia Transfer of Care Note  Patient: Rebecca [name]  Procedure(s) Performed: RIGHT FOOT 2ND RAY AMPUTATION (Right )  Patient Location: PACU  Anesthesia Type:General  Level of Consciousness: drowsy  Airway & Oxygen Therapy: Patient Spontanous Breathing and Patient connected to face mask oxygen  Post-op Assessment: Report given to RN and Post -op Vital signs reviewed and stable  Post vital signs: Reviewed and stable  Last Vitals:  Vitals Value Taken Time  BP 117/56 07/05/20 0813  Temp    Pulse 89 07/05/20 0814  Resp 12 07/05/20 0814  SpO2 94 % 07/05/20 0814  Vitals shown include unvalidated device data.  Last Pain:  Vitals:   07/05/20 0638  TempSrc:   PainSc: 5       Patients Stated Pain Goal: 2 (07/05/20 2863)  Complications: No complications documented.

## 2020-07-05 NOTE — Anesthesia Preprocedure Evaluation (Signed)
Anesthesia Evaluation  Patient identified by MRN, date of birth, ID band Patient awake    Reviewed: Allergy & Precautions, NPO status , Patient's Chart, lab work & pertinent test results  Airway Mallampati: II  TM Distance: >3 FB     Dental   Pulmonary neg pulmonary ROS,    breath sounds clear to auscultation       Cardiovascular hypertension, + CAD and + Past MI   Rhythm:Regular Rate:Normal     Neuro/Psych negative neurological ROS     GI/Hepatic negative GI ROS, Neg liver ROS,   Endo/Other  diabetesHyperthyroidism   Renal/GU Renal disease     Musculoskeletal   Abdominal   Peds  Hematology  (+) anemia ,   Anesthesia Other Findings   Reproductive/Obstetrics                             Anesthesia Physical Anesthesia Plan  ASA: III  Anesthesia Plan: General   Post-op Pain Management:    Induction: Intravenous  PONV Risk Score and Plan: 3 and Ondansetron, Dexamethasone and Midazolam  Airway Management Planned: LMA  Additional Equipment:   Intra-op Plan:   Post-operative Plan: Extubation in OR  Informed Consent: I have reviewed the patients History and Physical, chart, labs and discussed the procedure including the risks, benefits and alternatives for the proposed anesthesia with the patient or authorized representative who has indicated his/her understanding and acceptance.     Dental advisory given  Plan Discussed with: CRNA and Anesthesiologist  Anesthesia Plan Comments:         Anesthesia Quick Evaluation

## 2020-07-05 NOTE — Anesthesia Postprocedure Evaluation (Signed)
Anesthesia Post Note  Patient: Rebecca [name]  Procedure(s) Performed: RIGHT FOOT 2ND RAY AMPUTATION (Right )     Patient location during evaluation: PACU Anesthesia Type: General Level of consciousness: awake Pain management: pain level controlled Vital Signs Assessment: post-procedure vital signs reviewed and stable Respiratory status: spontaneous breathing Cardiovascular status: stable Postop Assessment: no apparent nausea or vomiting Anesthetic complications: no   No complications documented.  Last Vitals:  Vitals:   07/05/20 0845 07/05/20 0900  BP: 132/80 (!) 141/83  Pulse: 87 88  Resp: 14 16  Temp:  36.5 C  SpO2: 99% 97%    Last Pain:  Vitals:   07/05/20 0830  TempSrc:   PainSc: 0-No pain                 Kijuan Gallicchio

## 2020-07-05 NOTE — Op Note (Signed)
07/05/2020  8:17 AM  PATIENT:  Rebecca [name]    PRE-OPERATIVE DIAGNOSIS:  Osteomyelitis right second metatarsal   POST-OPERATIVE DIAGNOSIS:  Same  PROCEDURE:  RIGHT FOOT 2ND RAY AMPUTATION  SURGEON:  Nadara Mustard, MD  PHYSICIAN ASSISTANT:None ANESTHESIA:   General  PREOPERATIVE INDICATIONS:  Rebecca [name] is a  54 y.o. female with a diagnosis of Osteomyelitis right second metatarsal  who failed conservative measures and elected for surgical management.    The risks benefits and alternatives were discussed with the patient preoperatively including but not limited to the risks of infection, bleeding, nerve injury, cardiopulmonary complications, the need for revision surgery, among others, and the patient was willing to proceed.  OPERATIVE IMPLANTS: None  @ENCIMAGES @  OPERATIVE FINDINGS: Necrotic changes with dark discoloration to the second metatarsal head  Cultures sent.  Soft tissue  OPERATIVE PROCEDURE: Patient was brought the operating room and underwent a general anesthetic.  After adequate levels anesthesia were obtained patient's right lower extremity was prepped using DuraPrep draped into a sterile field a timeout was called.  AV incision was made around the ulcerative tissue in the second ray.  The second ray was resected through the midshaft the bone was discolored necrotic.  The abscess tissue and necrotic bone was sent for cultures.  The wound was irrigated with normal saline electrocautery was used hemostasis.  The incision was closed using 2-0 nylon sterile dressing was applied patient was extubated taken the PACU in stable condition.   DISCHARGE PLANNING:  Antibiotic duration: Preoperative antibiotics  Weightbearing: Touchdown weightbearing on the right  Pain medication: Prescription for Percocet  Dressing care/ Wound VAC: Follow-up in 1 week to change the dressing  Ambulatory devices: Crutches  Discharge to: Home.  Follow-up: In the  office 1 week post operative.

## 2020-07-05 NOTE — H&P (Signed)
Rebecca [name] is an 54 y.o. female.   Chief Complaint: Right foot osteomyelitis HPI: Patient is a 54 year old woman who is seen in follow-up for her right foot she is status post previous great toe amputation on the right.  Patient complains of drainage from the ulcer beneath the second metatarsal head she has completed her course of antibiotics.  Past Medical History:  Diagnosis Date  . Anemia   . Diabetes mellitus   . Dyslipidemia   . Hypertension   . Non-STEMI (non-ST elevated myocardial infarction) [facility])    Medical therapy for distal LAD dissection  . Thyroid disease     Past Surgical History:  Procedure Laterality Date  . AMPUTATION Right 07/21/2017   Procedure: RIGHT GREAT TOE AMPUTATION;  Surgeon: Nadara Mustard, MD;  Location: [facility] OR;  Service: Orthopedics;  Laterality: Right;  . CARDIAC CATHETERIZATION N/A 05/14/2015   Procedure: Left Heart Cath and Coronary Angiography;  Surgeon: Marykay Lex, MD; dLAD 60%>>95% (small vessel, ?dissection), CFX & RCA systems no sig dz, EF & LVEDP nl       Family History  Problem Relation Age of Onset  . Hypertension Father   . Hyperlipidemia Father   . Cancer Mother   . Diabetes Sister   . Diabetes Brother   . Diabetes Sister   . Diabetes Sister   . Diabetes Sister   . Diabetes Brother   . Diabetes Brother   . Diabetes Brother   . Diabetes Brother    Social History:  reports that she has never smoked. She has never used smokeless tobacco. She reports that she does not drink alcohol and does not use drugs.  Allergies: No Known Allergies  Medications Prior to Admission  Medication Sig Dispense Refill  . aspirin 81 MG EC tablet Take 1 tablet (81 mg total) by mouth daily. 90 tablet 1  . atorvastatin (LIPITOR) 40 MG tablet Take 1 tablet (40 mg total) by mouth at bedtime. 90 tablet 1  . doxycycline (VIBRA-TABS) 100 MG tablet Take 1 tablet (100 mg total) by mouth 2 (two) times daily. 60 tablet 0  . gabapentin (NEURONTIN)  600 MG tablet TAKE 2 TABLETS BY MOUTH 3 TIMES DAILY. (Patient taking differently: Take 1,200 mg by mouth 3 (three) times daily. ) 180 tablet 0  . hydrochlorothiazide (HYDRODIURIL) 25 MG tablet Take 1 tablet (25 mg total) by mouth daily. 90 tablet 1  . insulin aspart (NOVOLOG) 100 UNIT/ML injection Inject 15 Units into the skin 3 (three) times daily before meals. 15 mL 3  . Insulin Glargine (BASAGLAR KWIKPEN) 100 UNIT/ML Inject 0.34 mLs (34 Units total) into the skin daily. (Patient taking differently: Inject 34 Units into the skin 2 (two) times daily. ) 5 mL 0  . Insulin Pen Needle (PEN NEEDLES) 31G X 8 MM MISC Inject 1 each into the skin at bedtime. 90 each 1  . losartan (COZAAR) 100 MG tablet Take 1 tablet (100 mg total) by mouth daily. 90 tablet 1  . methimazole (TAPAZOLE) 5 MG tablet Take 1 tablet (5 mg total) by mouth every Monday, Wednesday, and Friday. 90 tablet 1  . sitaGLIPtin (JANUVIA) 100 MG tablet Take 1 tablet (100 mg total) by mouth daily. 90 tablet 1  . metFORMIN (GLUCOPHAGE XR) 500 MG 24 hr tablet Take 2 tablets (1,000 mg total) by mouth 2 (two) times daily after a meal. (Patient not taking: Reported on 07/01/2020) 120 tablet 1  . mupirocin ointment (BACTROBAN) 2 % Apply 1 application topically  daily. 22 g 6  . nitroGLYCERIN (NITROSTAT) 0.4 MG SL tablet Place 1 tablet (0.4 mg total) under the tongue every 5 (five) minutes as needed for chest pain. 25 tablet 3  . polyethylene glycol (MIRALAX / GLYCOLAX) 17 g packet Take 17 g by mouth daily. 14 each 3  . potassium chloride SA (KLOR-CON) 10 MEQ tablet Take 1 tablet (10 mEq total) by mouth daily. (Patient not taking: Reported on 07/01/2020) 90 tablet 1  . rOPINIRole (REQUIP) 0.25 MG tablet Take 1 tablet (0.25 mg total) by mouth at bedtime. (Patient not taking: Reported on 07/01/2020) 60 tablet 1    Results for orders placed or performed during the hospital encounter of 07/05/20 (from the past 48 hour(s))  Glucose, capillary     Status:  Abnormal   Collection Time: 07/05/20  5:57 AM  Result Value Ref Range   Glucose-Capillary 193 (H) 70 - 99 mg/dL    Comment: Glucose reference range applies only to samples taken after fasting for at least 8 hours.   No results found.  Review of Systems  All other systems reviewed and are negative.   Blood pressure (!) 168/92, pulse 86, temperature 98.3 F (36.8 C), temperature source Oral, resp. rate 18, SpO2 100 %. Physical Exam  Patient is alert, oriented, no adenopathy, well-dressed, normal affect, normal respiratory effort. Examination patient has a good dorsalis pedis pulse there is no ascending cellulitis there is no purulent drainage patient has an ulcer that probes circumferentially around the second metatarsal head the ulcer is 1 cm deep and 5 mm in diameter.  Patient has a Wagner grade 3 ulcer. Assessment/Plan 1. Pain in right foot   2. Subacute osteomyelitis of right foot (HCC)     Plan: Patient has osteomyelitis of the second metatarsal head.  Will plan for amputation of the second ray outpatient surgery on Friday risks and benefits were discussed including risk of the wound not healing need for additional surgery.  Patient states she understands wished to proceed at this time patient was seen with a third-party interpreter.   [place] Qamar Rosman, [place] 07/05/2020, 6:42 AM

## 2020-07-05 NOTE — Anesthesia Procedure Notes (Signed)
Procedure Name: LMA Insertion Date/Time: 07/05/2020 7:43 AM Performed by: Marny Lowenstein, CRNA Pre-anesthesia Checklist: Patient identified, Emergency Drugs available, Suction available and Patient being monitored Patient Re-evaluated:Patient Re-evaluated prior to induction Oxygen Delivery Method: Circle system utilized Preoxygenation: Pre-oxygenation with 100% oxygen Induction Type: IV induction Ventilation: Mask ventilation without difficulty LMA: LMA inserted LMA Size: 4.0 Number of attempts: 1 Placement Confirmation: positive ETCO2 and breath sounds checked- equal and bilateral Tube secured with: Tape Dental Injury: Teeth and Oropharynx as per pre-operative assessment

## 2020-07-06 ENCOUNTER — Encounter (HOSPITAL_COMMUNITY): Payer: Self-pay | Admitting: Orthopedic Surgery

## 2020-07-08 ENCOUNTER — Other Ambulatory Visit: Payer: Self-pay | Admitting: Physician Assistant

## 2020-07-08 MED ORDER — CIPROFLOXACIN HCL 500 MG PO TABS: 500.0000 mg | ORAL_TABLET | Freq: Two times a day (BID) | ORAL | 0 refills | Status: DC

## 2020-07-08 MED FILL — CIPROFLOXACIN HCL 500 MG TA: 500 | 15 days supply | Qty: 30 | Fill #0

## 2020-07-12 ENCOUNTER — Ambulatory Visit (INDEPENDENT_AMBULATORY_CARE_PROVIDER_SITE_OTHER): Payer: Self-pay | Admitting: Physician Assistant

## 2020-07-12 ENCOUNTER — Encounter: Payer: Self-pay | Admitting: Physician Assistant

## 2020-07-12 VITALS — Ht 62.0 in | Wt 184.0 lb

## 2020-07-12 DIAGNOSIS — M869 Osteomyelitis, unspecified: Secondary | ICD-10-CM

## 2020-07-12 NOTE — Progress Notes (Signed)
Office Visit Note   Patient: Rebecca [name]           Date of Birth: 25-Jun-1966           MRN: [id number] Visit Date: 07/12/2020              Requested by: Grayce Sessions, NP [address] [place],  [place] [address] PCP: Grayce Sessions, NP  Chief Complaint  Patient presents with  . Right Foot - Routine Post Op    07/05/20 right foot 2nd ray amputation       HPI: Patient is a pleasant 54 year old woman who is now 1 week status post right foot second ray amputation she has accompanied by her daughter  Assessment & Plan: Visit Diagnoses: No diagnosis found.  Plan: Emphasized the importance of elevating her foot above her heart level and just not on the leg rest on the wheelchair. Patient is on appropriate antibiotic now ciprofloxacin. She should continue to take this. I did talk to her daughter as well about taking a probiotic. Will wash daily with Dial and water apply clean dressing follow-up 1 week  Follow-Up Instructions: No follow-ups on file.   Ortho Exam  Patient is alert, oriented, no adenopathy, well-dressed, normal affect, normal respiratory effort. Well approximated wound edges minimal bloody dressing she does have a moderate amount of soft tissue swelling but no cellulitis no foul odor remaining toes are warm and pink  Imaging: No results found. No images are attached to the encounter.  Labs: Lab Results  Component Value Date   HGBA1C 11.4 (A) 04/15/2020   HGBA1C 9.3 (A) 01/11/2020   HGBA1C 11.0 (A) 10/03/2019   ESRSEDRATE 54 (H) 07/18/2017   CRP <0.8 07/18/2017   REPTSTATUS PENDING 07/05/2020   GRAMSTAIN  07/05/2020    FEW WBC PRESENT,BOTH PMN AND MONONUCLEAR FEW GRAM POSITIVE COCCI IN PAIRS IN CLUSTERS    CULT  07/05/2020    FEW STAPHYLOCOCCUS EPIDERMIDIS FEW ENTEROCOCCUS FAECALIS HOLDING FOR POSSIBLE ANAEROBE Performed at [facility], [address], [place], [place] [address]    [facility] STAPHYLOCOCCUS EPIDERMIDIS  07/05/2020   LABORGA ENTEROCOCCUS FAECALIS 07/05/2020     Lab Results  Component Value Date   ALBUMIN 4.5 11/14/2019   ALBUMIN 3.8 07/21/2019   ALBUMIN 4.5 06/22/2019   PREALBUMIN 26.0 07/19/2017    Lab Results  Component Value Date   MG 2.0 11/28/2018   MG 1.8 06/23/2018   MG 2.3 05/13/2015   Lab Results  Component Value Date   VD25OH 28 (L) 01/06/2010   VD25OH 23 (L) 07/02/2009    Lab Results  Component Value Date   PREALBUMIN 26.0 07/19/2017   CBC EXTENDED Latest Ref Rng & Units 07/05/2020 04/15/2020 11/14/2019  WBC 4.0 - 10.5 K/uL 8.0 CANCELED 7.5  RBC 3.87 - 5.11 MIL/uL 4.01 CANCELED 4.54  HGB 12.0 - 15.0 g/dL 11.3(L) CANCELED 13.0  HCT 36 - 46 % 34.8(L) CANCELED 37.8  PLT 150 - 400 K/uL 328 CANCELED 316  NEUTROABS 1.40 - 7.00 x10E3/uL - - 4.9  LYMPHSABS - - CANCELED 1.7     Body mass index is 33.65 kg/m.  Orders:  No orders of the defined types were placed in this encounter.  No orders of the defined types were placed in this encounter.    Procedures: No procedures performed  Clinical Data: No additional findings.  ROS:  All other systems negative, except as noted in the HPI. Review of Systems  Objective: Vital Signs: Ht  5\' 2"  (1.575 m)   Wt 184 lb (83.5 kg)   LMP  (LMP Unknown)   BMI 33.65 kg/m   Specialty Comments:  No specialty comments available.  PMFS History: Patient Active Problem List   Diagnosis Date Noted  . Osteomyelitis of second toe of right foot (HCC)   . Ulcer of right foot, limited to breakdown of skin (HCC) 02/21/2020  . Diarrhea   . Hyperthyroidism   . Insulin dependent diabetes mellitus   . Orthostasis   . Vaginal discharge   . Infectious diarrhea 11/26/2018  . Hyperkalemia 11/26/2018  . Bacteremia 06/21/2018  . Sepsis secondary to UTI (HCC) 06/20/2018  . Right great toe amputee (HCC) 08/05/2017  . Mild renal insufficiency 07/19/2017  . AKI (acute kidney injury) (HCC) 03/04/2016  . Essential hypertension  05/14/2015  . CAD- with distal LAD disease, ? 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 05/14/2015  . NSTEMI (non-ST elevated myocardial infarction) (HCC)   . Syncope 05/13/2015  . Hyperglycemia 10/25/2014  . Abrasion of thigh, left 10/25/2014  . DM (diabetes mellitus), type 2, uncontrolled (HCC)   . DM hyperosmolar coma, type 2 (HCC) 05/22/2012  . Hyperosmolar syndrome 05/21/2012   Past Medical History:  Diagnosis Date  . Anemia   . Diabetes mellitus   . Dyslipidemia   . Hypertension   . Non-STEMI (non-ST elevated myocardial infarction) [facility])    Medical therapy for distal LAD dissection  . Thyroid disease     Family History  Problem Relation Age of Onset  . Hypertension Father   . Hyperlipidemia Father   . Cancer Mother   . Diabetes Sister   . Diabetes Brother   . Diabetes Sister   . Diabetes Sister   . Diabetes Sister   . Diabetes Brother   . Diabetes Brother   . Diabetes Brother   . Diabetes Brother     Past Surgical History:  Procedure Laterality Date  . AMPUTATION Right 07/21/2017   Procedure: RIGHT GREAT TOE AMPUTATION;  Surgeon: 07/23/2017, MD;  Location: [facility] OR;  Service: Orthopedics;  Laterality: Right;  . AMPUTATION Right 07/05/2020   Procedure: RIGHT FOOT 2ND RAY AMPUTATION;  Surgeon: 07/07/2020, MD;  Location: [facility] OR;  Service: Orthopedics;  Laterality: Right;  . CARDIAC CATHETERIZATION N/A 05/14/2015   Procedure: Left Heart Cath and Coronary Angiography;  Surgeon: 05/16/2015, MD; dLAD 60%>>95% (small vessel, ?dissection), CFX & RCA systems no sig dz, EF & LVEDP nl      Social History   Occupational History  . Occupation: unemployed  Tobacco Use  . Smoking status: Never Smoker  . Smokeless tobacco: Never Used  Vaping Use  . Vaping Use: Never used  Substance and Sexual Activity  . Alcohol use: No  . Drug use: No  . Sexual activity: Yes

## 2020-07-13 LAB — AEROBIC/ANAEROBIC CULTURE W GRAM STAIN (SURGICAL/DEEP WOUND)

## 2020-07-16 ENCOUNTER — Ambulatory Visit (INDEPENDENT_AMBULATORY_CARE_PROVIDER_SITE_OTHER): Payer: Self-pay | Admitting: Primary Care

## 2020-07-16 ENCOUNTER — Encounter: Payer: Self-pay | Admitting: Family

## 2020-07-16 ENCOUNTER — Other Ambulatory Visit: Payer: Self-pay

## 2020-07-16 ENCOUNTER — Other Ambulatory Visit (INDEPENDENT_AMBULATORY_CARE_PROVIDER_SITE_OTHER): Payer: Self-pay | Admitting: Primary Care

## 2020-07-16 ENCOUNTER — Encounter (INDEPENDENT_AMBULATORY_CARE_PROVIDER_SITE_OTHER): Payer: Self-pay | Admitting: Primary Care

## 2020-07-16 ENCOUNTER — Ambulatory Visit (INDEPENDENT_AMBULATORY_CARE_PROVIDER_SITE_OTHER): Payer: Self-pay | Admitting: Physician Assistant

## 2020-07-16 VITALS — BP 135/83 | HR 79 | Temp 97.2°F | Ht 62.0 in

## 2020-07-16 DIAGNOSIS — Z794 Long term (current) use of insulin: Secondary | ICD-10-CM

## 2020-07-16 DIAGNOSIS — Z23 Encounter for immunization: Secondary | ICD-10-CM

## 2020-07-16 DIAGNOSIS — I10 Essential (primary) hypertension: Secondary | ICD-10-CM

## 2020-07-16 DIAGNOSIS — E782 Mixed hyperlipidemia: Secondary | ICD-10-CM

## 2020-07-16 DIAGNOSIS — E0590 Thyrotoxicosis, unspecified without thyrotoxic crisis or storm: Secondary | ICD-10-CM

## 2020-07-16 DIAGNOSIS — M869 Osteomyelitis, unspecified: Secondary | ICD-10-CM

## 2020-07-16 DIAGNOSIS — Z760 Encounter for issue of repeat prescription: Secondary | ICD-10-CM

## 2020-07-16 DIAGNOSIS — E1165 Type 2 diabetes mellitus with hyperglycemia: Secondary | ICD-10-CM

## 2020-07-16 DIAGNOSIS — Z79899 Other long term (current) drug therapy: Secondary | ICD-10-CM

## 2020-07-16 DIAGNOSIS — E119 Type 2 diabetes mellitus without complications: Secondary | ICD-10-CM

## 2020-07-16 LAB — POCT GLYCOSYLATED HEMOGLOBIN (HGB A1C): Hemoglobin A1C: 10.1 % — AB (ref 4.0–5.6)

## 2020-07-16 LAB — GLUCOSE, POCT (MANUAL RESULT ENTRY): POC Glucose: 185 mg/dl — AB (ref 70–99)

## 2020-07-16 MED ORDER — GABAPENTIN 400 MG PO CAPS: 400.0000 mg | ORAL_CAPSULE | Freq: Three times a day (TID) | ORAL | 1 refills | Status: DC

## 2020-07-16 MED ORDER — TRULICITY 0.75 MG/0.5ML ~~LOC~~ SOAJ: 0.7500 mg | SUBCUTANEOUS | 4 refills | Status: DC

## 2020-07-16 MED ORDER — LOSARTAN POTASSIUM 100 MG PO TABS: 100.0000 mg | ORAL_TABLET | Freq: Every day | ORAL | 1 refills | Status: DC

## 2020-07-16 MED ORDER — METFORMIN HCL ER 500 MG PO TB24: 1000.0000 mg | ORAL_TABLET | Freq: Two times a day (BID) | ORAL | 1 refills | Status: DC

## 2020-07-16 MED ORDER — BASAGLAR KWIKPEN 100 UNIT/ML ~~LOC~~ SOPN: 34.0000 [IU] | PEN_INJECTOR | Freq: Every day | SUBCUTANEOUS | 6 refills | Status: DC

## 2020-07-16 MED ORDER — SITAGLIPTIN PHOSPHATE 100 MG PO TABS: 100.0000 mg | ORAL_TABLET | Freq: Every day | ORAL | 1 refills | Status: DC

## 2020-07-16 MED FILL — GABAPENTIN 400 MG CAPSULE: 400 | 30 days supply | Qty: 90 | Fill #0

## 2020-07-16 MED FILL — LOSARTAN POTASSIUM 100 MG T: 100 | 30 days supply | Qty: 30 | Fill #0

## 2020-07-16 MED FILL — ?TRULICITY 0.75 MG/0.5ML PE: 0.75 | 28 days supply | Qty: 2 | Fill #0

## 2020-07-16 MED FILL — ?BASAGLAR 100 UNITS/ML KWPE: 100 | 32 days supply | Qty: 12 | Fill #0

## 2020-07-16 MED FILL — metFORMIN HCL ER 500 MG TB2: 500 | 30 days supply | Qty: 120 | Fill #0

## 2020-07-16 NOTE — Progress Notes (Signed)
Office Visit Note   Patient: Rebecca [name]           Date of Birth: [birth year]           MRN: [id number] Visit Date: 07/16/2020              Requested by: Grayce Sessions, NP [address] [place],  [place] [address] PCP: Grayce Sessions, NP  Chief Complaint  Patient presents with  . Right Foot - Routine Post Op      HPI: Patient is a pleasant Spanish-speaking 54 year old woman who is accompanied by family member who interprets today.  She is 11 days status post right foot second ray amputation.  She was due to see [place] later this week but she had a little increased pain the other night and wanted to be evaluated today.  She is on antibiotics.  She denies fever chills  Assessment & Plan: Visit Diagnoses: No diagnosis found.  Plan: Overall I think her foot looks much better than it did a week ago obviously she has been elevating it more.  She should continue to cleanse it daily with Dial soap and water.  She will follow-up in a week at which time I believe sutures could be harvested. Follow-Up Instructions: No follow-ups on file.   Ortho Exam  Patient is alert, oriented, no adenopathy, well-dressed, normal affect, normal respiratory effort. Focused examination of her foot demonstrates healing wound.  There is no dehiscence no foul odor no drainage.  No necrosis.  She has a strong palpable dorsalis pedis pulse.  She is very tolerant to palpation no cellulitis or signs of infection  Imaging: No results found. No images are attached to the encounter.  Labs: Lab Results  Component Value Date   HGBA1C 10.1 (A) 07/16/2020   HGBA1C 11.4 (A) 04/15/2020   HGBA1C 9.3 (A) 01/11/2020   ESRSEDRATE 54 (H) 07/18/2017   CRP <0.8 07/18/2017   REPTSTATUS 07/13/2020 FINAL 07/05/2020   GRAMSTAIN  07/05/2020    FEW WBC PRESENT,BOTH PMN AND MONONUCLEAR FEW GRAM POSITIVE COCCI IN PAIRS IN CLUSTERS    CULT  07/05/2020    FEW STAPHYLOCOCCUS EPIDERMIDIS FEW ENTEROCOCCUS  FAECALIS RARE PREVOTELLA SPECIES BETA LACTAMASE POSITIVE    LABORGA STAPHYLOCOCCUS EPIDERMIDIS 07/05/2020   LABORGA ENTEROCOCCUS FAECALIS 07/05/2020     Lab Results  Component Value Date   ALBUMIN 4.5 11/14/2019   ALBUMIN 3.8 07/21/2019   ALBUMIN 4.5 06/22/2019   PREALBUMIN 26.0 07/19/2017    Lab Results  Component Value Date   MG 2.0 11/28/2018   MG 1.8 06/23/2018   MG 2.3 05/13/2015   Lab Results  Component Value Date   VD25OH 28 (L) 01/06/2010   VD25OH 23 (L) 07/02/2009    Lab Results  Component Value Date   PREALBUMIN 26.0 07/19/2017   CBC EXTENDED Latest Ref Rng & Units 07/05/2020 04/15/2020 11/14/2019  WBC 4.0 - 10.5 K/uL 8.0 CANCELED 7.5  RBC 3.87 - 5.11 MIL/uL 4.01 CANCELED 4.54  HGB 12.0 - 15.0 g/dL 11.3(L) CANCELED 13.0  HCT 36 - 46 % 34.8(L) CANCELED 37.8  PLT 150 - 400 K/uL 328 CANCELED 316  NEUTROABS 1.40 - 7.00 x10E3/uL - - 4.9  LYMPHSABS - - CANCELED 1.7     There is no height or weight on file to calculate BMI.  Orders:  No orders of the defined types were placed in this encounter.  No orders of the defined types were placed in this encounter.    Procedures: No  procedures performed  Clinical Data: No additional findings.  ROS:  All other systems negative, except as noted in the HPI. Review of Systems  Objective: Vital Signs: LMP  (LMP Unknown)   Specialty Comments:  No specialty comments available.  PMFS History: Patient Active Problem List   Diagnosis Date Noted  . Osteomyelitis of second toe of right foot (HCC)   . Ulcer of right foot, limited to breakdown of skin (HCC) 02/21/2020  . Diarrhea   . Hyperthyroidism   . Insulin dependent diabetes mellitus   . Orthostasis   . Vaginal discharge   . Infectious diarrhea 11/26/2018  . Hyperkalemia 11/26/2018  . Bacteremia 06/21/2018  . Sepsis secondary to UTI (HCC) 06/20/2018  . Right great toe amputee (HCC) 08/05/2017  . Mild renal insufficiency 07/19/2017  . AKI (acute  kidney injury) (HCC) 03/04/2016  . Essential hypertension 05/14/2015  . CAD- with distal LAD disease, ? 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 05/14/2015  . NSTEMI (non-ST elevated myocardial infarction) (HCC)   . Syncope 05/13/2015  . Hyperglycemia 10/25/2014  . Abrasion of thigh, left 10/25/2014  . DM (diabetes mellitus), type 2, uncontrolled (HCC)   . DM hyperosmolar coma, type 2 (HCC) 05/22/2012  . Hyperosmolar syndrome 05/21/2012   Past Medical History:  Diagnosis Date  . Anemia   . Diabetes mellitus   . Dyslipidemia   . Hypertension   . Non-STEMI (non-ST elevated myocardial infarction) [facility])    Medical therapy for distal LAD dissection  . Thyroid disease     Family History  Problem Relation Age of Onset  . Hypertension Father   . Hyperlipidemia Father   . Cancer Mother   . Diabetes Sister   . Diabetes Brother   . Diabetes Sister   . Diabetes Sister   . Diabetes Sister   . Diabetes Brother   . Diabetes Brother   . Diabetes Brother   . Diabetes Brother     Past Surgical History:  Procedure Laterality Date  . AMPUTATION Right 07/21/2017   Procedure: RIGHT GREAT TOE AMPUTATION;  Surgeon: Nadara Mustard, MD;  Location: [facility] OR;  Service: Orthopedics;  Laterality: Right;  . AMPUTATION Right 07/05/2020   Procedure: RIGHT FOOT 2ND RAY AMPUTATION;  Surgeon: Nadara Mustard, MD;  Location: [facility] OR;  Service: Orthopedics;  Laterality: Right;  . CARDIAC CATHETERIZATION N/A 05/14/2015   Procedure: Left Heart Cath and Coronary Angiography;  Surgeon: Marykay Lex, MD; dLAD 60%>>95% (small vessel, ?dissection), CFX & RCA systems no sig dz, EF & LVEDP nl      Social History   Occupational History  . Occupation: unemployed  Tobacco Use  . Smoking status: Never Smoker  . Smokeless tobacco: Never Used  Vaping Use  . Vaping Use: Never used  Substance and Sexual Activity  . Alcohol use: No  . Drug use: No  . Sexual activity: Yes

## 2020-07-16 NOTE — Progress Notes (Signed)
[facility]  HPI Ms.Nara I. Soy is a  54 y.o.female presents for follow up from the hospital. Admit date to the hospital was 07/05/20, patient was discharged from the hospital on 07/05/20, patient was admitted for: Osteomyelitis of second toe of right foot-right foot 2nd ray amputation   Past Medical History:  Diagnosis Date  . Anemia   . Diabetes mellitus   . Dyslipidemia   . Hypertension   . Non-STEMI (non-ST elevated myocardial infarction) [facility])    Medical therapy for distal LAD dissection  . Thyroid disease      No Known Allergies    Current Outpatient Medications on File Prior to Visit  Medication Sig Dispense Refill  . aspirin 81 MG EC tablet Take 1 tablet (81 mg total) by mouth daily. 90 tablet 1  . atorvastatin (LIPITOR) 40 MG tablet Take 1 tablet (40 mg total) by mouth at bedtime. 90 tablet 1  . ciprofloxacin (CIPRO) 500 MG tablet Take 1 tablet (500 mg total) by mouth 2 (two) times daily for 10 days. 30 tablet 0  . doxycycline (VIBRA-TABS) 100 MG tablet Take 1 tablet (100 mg total) by mouth 2 (two) times daily. 60 tablet 0  . gabapentin (NEURONTIN) 600 MG tablet TAKE 2 TABLETS BY MOUTH 3 TIMES DAILY. (Patient taking differently: Take 1,200 mg by mouth 3 (three) times daily. ) 180 tablet 0  . hydrochlorothiazide (HYDRODIURIL) 25 MG tablet Take 1 tablet (25 mg total) by mouth daily. 90 tablet 1  . insulin aspart (NOVOLOG) 100 UNIT/ML injection Inject 15 Units into the skin 3 (three) times daily before meals. 15 mL 3  . Insulin Glargine (BASAGLAR KWIKPEN) 100 UNIT/ML Inject 0.34 mLs (34 Units total) into the skin daily. (Patient taking differently: Inject 34 Units into the skin 2 (two) times daily. ) 5 mL 0  . Insulin Pen Needle (PEN NEEDLES) 31G X 8 MM MISC Inject 1 each into the skin at bedtime. 90 each 1  . losartan (COZAAR) 100 MG tablet Take 1 tablet (100 mg total) by mouth daily. 90 tablet 1  . metFORMIN (GLUCOPHAGE XR) 500 MG 24 hr tablet Take  2 tablets (1,000 mg total) by mouth 2 (two) times daily after a meal. 120 tablet 1  . methimazole (TAPAZOLE) 5 MG tablet Take 1 tablet (5 mg total) by mouth every Monday, Wednesday, and Friday. 90 tablet 1  . nitroGLYCERIN (NITROSTAT) 0.4 MG SL tablet Place 1 tablet (0.4 mg total) under the tongue every 5 (five) minutes as needed for chest pain. 25 tablet 3  . oxyCODONE-acetaminophen (PERCOCET) 5-325 MG tablet Take 1 tablet by mouth every 4 (four) hours as needed. 30 tablet 0  . polyethylene glycol (MIRALAX / GLYCOLAX) 17 g packet Take 17 g by mouth daily. 14 each 3  . sitaGLIPtin (JANUVIA) 100 MG tablet Take 1 tablet (100 mg total) by mouth daily. 90 tablet 1   No current facility-administered medications on file prior to visit.    ROS: all negative except above.   Physical Exam: There were no vitals filed for this visit. LMP  (LMP Unknown)  General Appearance: Well nourished, in no apparent distress. Eyes: PERRLA, EOMs, conjunctiva no swelling or erythema Sinuses: No Frontal/maxillary tenderness ENT/Mouth: Ext aud canals clear, TMs without erythema, bulging.  Hearing normal.  Neck: Supple, thyroid normal.  Respiratory: Respiratory effort normal, BS equal bilaterally without rales, rhonchi, wheezing or stridor.  Cardio: RRR with no MRGs. Brisk peripheral pulses without edema.  Abdomen: Soft, + BS.  Non tender, no guarding, rebound, hernias, masses. Lymphatics: Non tender without lymphadenopathy.  Musculoskeletal: Full ROM, 5/5 strength, normal gait.  Skin: Warm, dry without rashes, lesions, ecchymosis.  Neuro: Cranial nerves intact. Normal muscle tone, no cerebellar symptoms. Sensation intact.  Psych: Awake and oriented X 3, normal affect, Insight and Judgment appropriate.   Poet was seen today for diabetes.  Diagnoses and all orders for this visit:  Uncontrolled type 2 diabetes mellitus with hyperglycemia (HCC) -     HgB A1c 10.1 has come down from 11.4.  Patient just recently  had a amputation been on medication and treatment increased stress may account for blood sugars not being controlled. -     Glucose (CBG) -     metFORMIN (GLUCOPHAGE XR) 500 MG 24 hr tablet; Take 2 tablets (1,000 mg total) by mouth 2 (two) times daily after a meal. -     sitaGLIPtin (JANUVIA) 100 MG tablet; Take 1 tablet (100 mg total) by mouth daily. -     Insulin Glargine (BASAGLAR KWIKPEN) 100 UNIT/ML; Inject 34 Units into the skin daily.  Mixed hyperlipidemia -     Lipid Panel  Medication refill -     metFORMIN (GLUCOPHAGE XR) 500 MG 24 hr tablet; Take 2 tablets (1,000 mg total) by mouth 2 (two) times daily after a meal. -     losartan (COZAAR) 100 MG tablet; Take 1 tablet (100 mg total) by mouth daily. -     sitaGLIPtin (JANUVIA) 100 MG tablet; Take 1 tablet (100 mg total) by mouth daily.  Type 2 diabetes mellitus without complication, with long-term current use of insulin (HCC) -     metFORMIN (GLUCOPHAGE XR) 500 MG 24 hr tablet; Take 2 tablets (1,000 mg total) by mouth 2 (two) times daily after a meal. -     sitaGLIPtin (JANUVIA) 100 MG tablet; Take 1 tablet (100 mg total) by mouth daily.  Hypertension, unspecified type Blood pressure has made a significant improvement on Cozaar. Counseled on blood pressure goal of less than 130/80, low-sodium, DASH diet, medication compliance, 150 minutes of moderate intensity exercise per week. -     losartan (COZAAR) 100 MG tablet; Take 1 tablet (100 mg total) by mouth daily.  Hyperthyroidism Currently on Tapazole 5 mg Monday Wednesday and Friday no changes until labs already reviewed and discussed with supervising physician for treatment if labs are not normal -     TSH + free T4  Need for immunization against influenza CDC recommends influenza vaccine yearly.   -     Flu Vaccine QUAD 36+ mos IM  Medication management Extra time taking to spent with patient unable to read PCP started: Color coding  her medications each color represents a  different disease i.e. blue diabetes/gabapentin is placed in this category, purple cholesterol The number of lines indicate how many times a day she takes medication.   Osteomyelitis of second toe of right foot (HCC) The problems about our last visit she requests for me to remove her bandage where her amputation occur she felt increased pain and thinks drainage.  Did not feel comfortable with that request but assured her I would get in touch with her orthopedist.  Call Dr. Lajoyce Corners explained to him what the patient said and advised me to tell the patient after our visit to come straight to his office to evaluate surgical site.  Other orders -     Discontinue: Dulaglutide (TRULICITY) 0.75 MG/0.5ML SOPN; Inject 0.75 mg into the skin once a  week. -     gabapentin (NEURONTIN) 400 MG capsule; Take 1 capsule (400 mg total) by mouth 3 (three) times daily after meals.    Grayce Sessions, NP 8:42 AM

## 2020-07-16 NOTE — Patient Instructions (Signed)
Gripe en los adultos Influenza, Adult A la gripe tambin se la conoce como "influenza". Es una infeccin en los pulmones, la nariz y la garganta (vas respiratorias). La causa un virus. La gripe provoca sntomas que son similares a los de un resfro. Tambin causa fiebre alta y dolores corporales. Se transmite fcilmente de persona a persona (es contagiosa). La mejor manera de prevenir la gripe es aplicndose la vacuna contra la gripe todos los aos. Cules son las causas? La causa de esta afeccin es el virus de la influenza. Puede contraer el virus de las siguientes maneras:  Respirar las gotitas que estn en el aire y que provienen de la tos o el estornudo de una persona que tiene el virus.  Tocar algo que tiene el virus (est contaminado) y luego tocarse la boca, la nariz o los ojos. Qu incrementa el riesgo? Hay ciertas cosas que lo pueden hacer ms propenso a tener gripe. Estas incluyen lo siguiente:  No lavarse las manos con frecuencia.  Tener contacto cercano con muchas personas durante la temporada de resfro y gripe.  Tocarse la boca, los ojos o la nariz sin antes lavarse las manos.  No recibir la vacuna antigripal todos los aos. Puede correr un mayor riesgo de tener gripe, junto con problemas graves como una infeccin pulmonar (neumona), si:  Es mayor de 65 aos de edad.  Est embarazada.  Tiene debilitado el sistema que combate las defensas (sistema inmunitario) debido a una enfermedad o porque toma determinados medicamentos.  Tiene una enfermedad prolongada (crnica), por ejemplo: ? Enfermedad cardaca, renal o pulmonar. ? Diabetes. ? Asma.  Tiene un trastorno heptico.  Tiene mucho sobrepeso (obesidad mrbida).  Tiene anemia. Esta es una afeccin que afecta a los glbulos rojos. Cules son los signos o los sntomas? Los sntomas normalmente comienzan de repente y duran entre 4 y 14 das. Pueden incluir los siguientes:  Fiebre y escalofros.  Dolores de  cabeza, dolores en el cuerpo o dolores musculares.  Dolor de garganta.  Tos.  Secrecin o congestin nasal.  Malestar en el pecho.  No desear comer en las cantidades normales (prdida del apetito).  Debilidad o cansancio (fatiga).  Mareos.  Malestar estomacal (nuseas) o ganas de devolver (vmitos). Cmo se trata? Si la gripe se encuentra de forma temprana, se la puede tratar con medicamentos que pueden ayudar a reducir la gravedad de la enfermedad y reducir su duracin (medicamentos antivirales). Estos pueden administrarse por boca (va oral) o por va (catter) intravenosa. Cuidarse en su hogar puede ayudar a que mejoren los sntomas. El mdico puede sugerirle lo siguiente:  Tomar medicamentos de venta libre.  Beber mucho lquido. La gripe suele desaparecer sola. Si tiene sntomas muy graves u otros problemas, puede recibir tratamiento en un hospital. Siga estas indicaciones en su casa:     Actividad  Descanse todo lo que sea necesario. Duerma lo suficiente.  Qudese en su casa y no concurra al trabajo o a la escuela, como se lo haya indicado el mdico. ? No salga de su casa hasta que no haya tenido fiebre por 24horas sin tomar medicamentos. ? Salga de su casa solo para ir al mdico. Comida y bebida  Tome una SRO (solucin de rehidratacin oral). Es una bebida que se vende en farmacias y tiendas.  Beba suficiente lquido para mantener el pis (la orina) de color amarillo plido.  En la medida en que pueda, beba lquidos claros en pequeas cantidades. Los lquidos transparentes son, por ejemplo: ? Agua. ? Trocitos   de hielo. ? Jugo de frutas con agua agregada (jugo de frutas diluido). ? Bebidas deportivas de bajas caloras.  En la medida en que pueda, consuma alimentos blandos y fciles de digerir en pequeas cantidades. Estos alimentos incluyen: ? Bananas. ? Pur de manzana. ? Arroz. ? Carnes magras. ? Tostadas. ? Galletas.  No coma ni beba lo  siguiente: ? Lquidos con alto contenido de azcar o cafena. ? Alcohol. ? Alimentos condimentados o con alto contenido de grasa. Indicaciones generales  Tome los medicamentos de venta libre y los recetados solamente como se lo haya indicado el mdico.  Use un humidificador de aire fro para que el aire de su casa est ms hmedo. Esto puede facilitar la respiracin.  Al toser o estornudar, cbrase la boca y la nariz.  Lvese las manos con agua y jabn frecuentemente, en especial despus de toser o estornudar. Use desinfectante para manos con alcohol si no dispone de agua y jabn.  Concurra a todas las visitas de control como se lo haya indicado el mdico. Esto es importante. Cmo se evita?   Colquese la vacuna antigripal todos los aos. Puede colocarse la vacuna contra la gripe a fines de verano, en otoo o en invierno. Pregntele al mdico cundo debe aplicarse la vacuna contra la gripe.  Evite el contacto con personas que estn enfermas durante el otoo y el invierno (la temporada de resfro y gripe). Comunquese con un mdico si:  Tiene sntomas nuevos.  Tiene los siguientes sntomas: ? Dolor en el pecho. ? Materia fecal lquida (diarrea). ? Fiebre.  La tos empeora.  Empieza a tener ms mucosidad.  Tiene malestar estomacal.  Vomita. Solicite ayuda inmediatamente si:  Le falta el aire.  Tiene dificultad para respirar.  La piel o las uas se ponen de un color azulado.  Presenta dolor muy intenso o rigidez en el cuello.  Tiene dolor de cabeza repentino.  Le duele la cara o el odo de forma repentina.  No puede comer ni beber sin vomitar. Resumen  La gripe es una infeccin en los pulmones, la nariz y la garganta. La causa un virus.  Tome los medicamentos de venta libre y los recetados solamente como se lo haya indicado el mdico.  Aplicarse la vacuna contra la gripe todos los aos es la mejor manera de evitar contagiarse la gripe. Esta informacin no tiene  como fin reemplazar el consejo del mdico. Asegrese de hacerle al mdico cualquier pregunta que tenga. Document Revised: 04/20/2018 Document Reviewed: 04/20/2018 Elsevier Patient Education  2020 Elsevier Inc.  

## 2020-07-17 ENCOUNTER — Telehealth: Payer: Self-pay | Admitting: Primary Care

## 2020-07-17 LAB — LIPID PANEL
Chol/HDL Ratio: 3.3 ratio (ref 0.0–4.4)
Cholesterol, Total: 126 mg/dL (ref 100–199)
HDL: 38 mg/dL — ABNORMAL LOW (ref >39)
LDL Chol Calc (NIH): 64 mg/dL (ref 0–99)
Triglycerides: 139 mg/dL (ref 0–149)
VLDL Cholesterol Cal: 24 mg/dL (ref 5–40)

## 2020-07-17 LAB — TSH+FREE T4
Free T4: 1.21 ng/dL (ref 0.82–1.77)
TSH: 2.76 u[IU]/mL (ref 0.450–4.500)

## 2020-07-17 NOTE — Telephone Encounter (Signed)
Please contact patient to provide clarity.

## 2020-07-17 NOTE — Telephone Encounter (Signed)
Copied from CRM [id number]. Topic: General - Other >> Jul 17, 2020  9:39 AM Tamela Oddi wrote: Reason for CRM: Patient would like the nurse to call her regarding her medication dosage.  When she picked up the medication, it was a different dosage from what the nurse told her to take at her previous appt.  Please call patient to discuss at [phone].  Interpreter needed.

## 2020-07-19 ENCOUNTER — Ambulatory Visit: Payer: Self-pay | Admitting: Physician Assistant

## 2020-07-23 NOTE — Telephone Encounter (Signed)
Interpretor used clarify new insulin not to start until seen by Clinical pharmacist and went over lab results

## 2020-07-24 ENCOUNTER — Encounter: Payer: Self-pay | Admitting: Physician Assistant

## 2020-07-24 ENCOUNTER — Ambulatory Visit (INDEPENDENT_AMBULATORY_CARE_PROVIDER_SITE_OTHER): Payer: Self-pay | Admitting: Physician Assistant

## 2020-07-24 VITALS — Ht 62.0 in | Wt 184.0 lb

## 2020-07-24 DIAGNOSIS — M869 Osteomyelitis, unspecified: Secondary | ICD-10-CM

## 2020-07-24 MED FILL — LOSARTAN POTASSIUM 100 MG T: 100 | 30 days supply | Qty: 30 | Fill #0

## 2020-07-24 MED FILL — $novoLOG FLEXPEN SYRINGE: 100 | 33 days supply | Qty: 15 | Fill #1

## 2020-07-24 NOTE — Progress Notes (Signed)
Office Visit Note   Patient: Rebecca [name]           Date of Birth: November 25, 1965           MRN: [id number] Visit Date: 07/24/2020              Requested by: Grayce Sessions, NP [address] [place],  [place] [address] PCP: Grayce Sessions, NP  Chief Complaint  Patient presents with  . Right Foot - Routine Post Op    07/05/20 right 2 nd ray amputation       HPI: Patient is 2 weeks status post right foot second ray amputation.  She has no complaints and is feeling well.  She is accompanied by an interpreter  Assessment & Plan: Visit Diagnoses: No diagnosis found.  Plan: Plan for suture removal next week will need prescription for orthotic.  Continue dry dressing changes  Follow-Up Instructions: No follow-ups on file.   Ortho Exam  Patient is alert, oriented, no adenopathy, well-dressed, normal affect, normal respiratory effort. Well-healed surgical incision sutures in place mild soft tissue swelling no cellulitis no foul odor no evidence of any infection no drainage  Imaging: No results found. No images are attached to the encounter.  Labs: Lab Results  Component Value Date   HGBA1C 10.1 (A) 07/16/2020   HGBA1C 11.4 (A) 04/15/2020   HGBA1C 9.3 (A) 01/11/2020   ESRSEDRATE 54 (H) 07/18/2017   CRP <0.8 07/18/2017   REPTSTATUS 07/13/2020 FINAL 07/05/2020   GRAMSTAIN  07/05/2020    FEW WBC PRESENT,BOTH PMN AND MONONUCLEAR FEW GRAM POSITIVE COCCI IN PAIRS IN CLUSTERS    CULT  07/05/2020    FEW STAPHYLOCOCCUS EPIDERMIDIS FEW ENTEROCOCCUS FAECALIS RARE PREVOTELLA SPECIES BETA LACTAMASE POSITIVE    LABORGA STAPHYLOCOCCUS EPIDERMIDIS 07/05/2020   LABORGA ENTEROCOCCUS FAECALIS 07/05/2020     Lab Results  Component Value Date   ALBUMIN 4.5 11/14/2019   ALBUMIN 3.8 07/21/2019   ALBUMIN 4.5 06/22/2019   PREALBUMIN 26.0 07/19/2017    Lab Results  Component Value Date   MG 2.0 11/28/2018   MG 1.8 06/23/2018   MG 2.3 05/13/2015   Lab Results   Component Value Date   VD25OH 28 (L) 01/06/2010   VD25OH 23 (L) 07/02/2009    Lab Results  Component Value Date   PREALBUMIN 26.0 07/19/2017   CBC EXTENDED Latest Ref Rng & Units 07/05/2020 04/15/2020 11/14/2019  WBC 4.0 - 10.5 K/uL 8.0 CANCELED 7.5  RBC 3.87 - 5.11 MIL/uL 4.01 CANCELED 4.54  HGB 12.0 - 15.0 g/dL 11.3(L) CANCELED 13.0  HCT 36 - 46 % 34.8(L) CANCELED 37.8  PLT 150 - 400 K/uL 328 CANCELED 316  NEUTROABS 1.40 - 7.00 x10E3/uL - - 4.9  LYMPHSABS - - CANCELED 1.7     Body mass index is 33.65 kg/m.  Orders:  No orders of the defined types were placed in this encounter.  No orders of the defined types were placed in this encounter.    Procedures: No procedures performed  Clinical Data: No additional findings.  ROS:  All other systems negative, except as noted in the HPI. Review of Systems  Objective: Vital Signs: Ht 5\' 2"  (1.575 m)   Wt 184 lb (83.5 kg)   LMP  (LMP Unknown)   BMI 33.65 kg/m   Specialty Comments:  No specialty comments available.  PMFS History: Patient Active Problem List   Diagnosis Date Noted  . Osteomyelitis of second toe of right foot (HCC)   . Ulcer  of right foot, limited to breakdown of skin (HCC) 02/21/2020  . Diarrhea   . Hyperthyroidism   . Insulin dependent diabetes mellitus   . Orthostasis   . Vaginal discharge   . Infectious diarrhea 11/26/2018  . Hyperkalemia 11/26/2018  . Bacteremia 06/21/2018  . Sepsis secondary to UTI (HCC) 06/20/2018  . Right great toe amputee (HCC) 08/05/2017  . Mild renal insufficiency 07/19/2017  . AKI (acute kidney injury) (HCC) 03/04/2016  . Essential hypertension 05/14/2015  . CAD- with distal LAD disease, ? 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 05/14/2015  . NSTEMI (non-ST elevated myocardial infarction) (HCC)   . Syncope 05/13/2015  . Hyperglycemia 10/25/2014  . Abrasion of thigh, left 10/25/2014  . DM (diabetes mellitus), type 2, uncontrolled (HCC)   . DM hyperosmolar coma, type 2 (HCC)  05/22/2012  . Hyperosmolar syndrome 05/21/2012   Past Medical History:  Diagnosis Date  . Anemia   . Diabetes mellitus   . Dyslipidemia   . Hypertension   . Non-STEMI (non-ST elevated myocardial infarction) [facility])    Medical therapy for distal LAD dissection  . Thyroid disease     Family History  Problem Relation Age of Onset  . Hypertension Father   . Hyperlipidemia Father   . Cancer Mother   . Diabetes Sister   . Diabetes Brother   . Diabetes Sister   . Diabetes Sister   . Diabetes Sister   . Diabetes Brother   . Diabetes Brother   . Diabetes Brother   . Diabetes Brother     Past Surgical History:  Procedure Laterality Date  . AMPUTATION Right 07/21/2017   Procedure: RIGHT GREAT TOE AMPUTATION;  Surgeon: Nadara Mustard, MD;  Location: [facility] OR;  Service: Orthopedics;  Laterality: Right;  . AMPUTATION Right 07/05/2020   Procedure: RIGHT FOOT 2ND RAY AMPUTATION;  Surgeon: Nadara Mustard, MD;  Location: [facility] OR;  Service: Orthopedics;  Laterality: Right;  . CARDIAC CATHETERIZATION N/A 05/14/2015   Procedure: Left Heart Cath and Coronary Angiography;  Surgeon: Marykay Lex, MD; dLAD 60%>>95% (small vessel, ?dissection), CFX & RCA systems no sig dz, EF & LVEDP nl      Social History   Occupational History  . Occupation: unemployed  Tobacco Use  . Smoking status: Never Smoker  . Smokeless tobacco: Never Used  Vaping Use  . Vaping Use: Never used  Substance and Sexual Activity  . Alcohol use: No  . Drug use: No  . Sexual activity: Yes

## 2020-07-29 MED FILL — methIMAzole 5 MG TABS: 5 | 30 days supply | Qty: 12 | Fill #6

## 2020-07-29 MED FILL — ?ATORVASTATIN 40MG TABLET: 40 | 30 days supply | Qty: 30 | Fill #5

## 2020-07-30 ENCOUNTER — Other Ambulatory Visit: Payer: Self-pay

## 2020-07-30 ENCOUNTER — Ambulatory Visit: Payer: Self-pay | Attending: Primary Care | Admitting: Pharmacist

## 2020-07-30 ENCOUNTER — Other Ambulatory Visit: Payer: Self-pay | Admitting: Family Medicine

## 2020-07-30 ENCOUNTER — Encounter: Payer: Self-pay | Admitting: Pharmacist

## 2020-07-30 DIAGNOSIS — E1165 Type 2 diabetes mellitus with hyperglycemia: Secondary | ICD-10-CM

## 2020-07-30 DIAGNOSIS — Z760 Encounter for issue of repeat prescription: Secondary | ICD-10-CM

## 2020-07-30 DIAGNOSIS — E119 Type 2 diabetes mellitus without complications: Secondary | ICD-10-CM

## 2020-07-30 DIAGNOSIS — Z794 Long term (current) use of insulin: Secondary | ICD-10-CM

## 2020-07-30 LAB — GLUCOSE, POCT (MANUAL RESULT ENTRY): POC Glucose: 329 mg/dl — AB (ref 70–99)

## 2020-07-30 MED ORDER — INSULIN ASPART 100 UNIT/ML ~~LOC~~ SOLN: 10.0000 [IU] | Freq: Two times a day (BID) | SUBCUTANEOUS | 3 refills | Status: DC

## 2020-07-30 MED ORDER — METFORMIN HCL ER 500 MG PO TB24: 500.0000 mg | ORAL_TABLET | Freq: Two times a day (BID) | ORAL | 2 refills | Status: DC

## 2020-07-30 MED ORDER — DULAGLUTIDE 0.75 MG/0.5ML ~~LOC~~ SOAJ: 0.7500 mg | SUBCUTANEOUS | 2 refills | Status: DC

## 2020-07-30 MED ORDER — BASAGLAR KWIKPEN 100 UNIT/ML ~~LOC~~ SOPN: 60.0000 [IU] | PEN_INJECTOR | Freq: Every day | SUBCUTANEOUS | 2 refills | Status: DC

## 2020-07-30 MED FILL — !BASAGLAR 100 UNIT/ML KWIK: 100 | 14 days supply | Qty: 9 | Fill #0

## 2020-07-30 MED FILL — metFORMIN HCL ER 500 MG TB2: 500 | 30 days supply | Qty: 60 | Fill #0

## 2020-07-30 MED FILL — JANUVIA 100 MG TABLET: 100 | 30 days supply | Qty: 30 | Fill #2

## 2020-07-30 NOTE — Progress Notes (Signed)
S:     No chief complaint on file.   Patient arrives well and in good spirits today.  Presents for diabetes evaluation, education, and management. Patient was referred and last seen by Juluis Mire on 07/16/20.   Today, patient reports taking medication differently than prescribed. Reports stopping metformin after being told metformin was damaging her kidneys. Patient reported frequently hypoglycemic events. She also presented with Trulicity in hand, which she has not yet started.   Patient reports Diabetes was diagnosed approximately 24 years ago.   Insurance coverage/medication affordability: Self pay  Medication adherence reported, however, taking differently than prescribed.  Current diabetes medications include:  Metformin 1 g BID (reported not taking)  Januvia 100 mg daily  Basaglar 34 units daily (reported taking 34 units BID)  Novolog 15 units TID (reported taking 15 units with meals BID)  Current hypertension medications include:  HCTZ 25 mg once daily  Losartan 100 mg once daily  Current hyperlipidemia medications include:  Atorvastatin 40 mg daily  Patient reports hypoglycemic events (1-2 times/week). Reports happening around bedtime, after dinner time Novolog dose.   Patient reported dietary habits: Eats 2 meals/day  Patient-reported exercise habits: none reported, exercise ability is limited due to recent toe amputation.   O:  ROS   POCT BG today: 329  Reports to having a cup of coffee with cream approximately 2 hours prior  Lab Results  Component Value Date   HGBA1C 10.1 (A) 07/16/2020   There were no vitals filed for this visit.  Lipid Panel     Component Value Date/Time   CHOL 126 07/16/2020 0942   TRIG 139 07/16/2020 0942   HDL 38 (L) 07/16/2020 0942   CHOLHDL 3.3 07/16/2020 0942   CHOLHDL 4.5 07/30/2015 0937   VLDL 26 07/30/2015 0937   LDLCALC 64 07/16/2020 0942    Home fasting blood sugars: 100-140 (subjectively  reported) Occasionally checks post-meal, reports a few BG in 500s after meals   Clinical Atherosclerotic Cardiovascular Disease (ASCVD): Yes  The ASCVD Risk score Mikey Bussing DC Jr., et al., 2013) failed to calculate for the following reasons:   The patient has a prior MI or stroke diagnosis    A/P: Diabetes longstanding currently uncontrolled. Patient is able to verbalize appropriate hypoglycemia management plan. Medication adherence appears limited as patient has been taking medication differently than prescribed. Control is suboptimal due to this. Thorough education needed to ensure understanding of medication regimen plan.  -Restart metformin at 1000 mg once daily with largest meal of the day. Dose chosen based on borderline eGFR. Collecting BMP today to assess renal function.  -Discontinued Januvia 100 mg daily  -Started Trulicity 9.50 mg once weekly. Patient advised injection day will be on Wednesdays. Counseled on proper injection technique. Advised patient that dose may need titrating over the next month.  -Adjusted Basaglar to 60 units once daily in the morning.  -Adjusted Novolog to 10 units BID with meals. Advising to skip the dose if skipping a meal.  -Extensively discussed pathophysiology of diabetes, recommended lifestyle interventions, dietary effects on blood sugar control -Counseled on s/sx of and management of hypoglycemia -Next A1C anticipated January 2022  ASCVD risk - primary prevention in patient with diabetes. Last LDL is controlled. ASCVD risk score is >20%  - high intensity statin indicated. Aspirin is indicated.  -Continued aspirin 81 mg  -Continued atorvastatin 40 mg.   Hypertension longstanding. Blood pressure goal = 130/80 mmHg. Medication adherence reported. Continued current blood pressure regimen.   Written  patient instructions provided.  Total time in face to face counseling 30 minutes.   Follow up Pharmacist Clinic Visit in 1 week.     Harriet Pho,  PharmD PGY-1 Community Pharmacy Resident   Beckey Rutter, PharmD Candidate  07/30/2020 11:52 AM

## 2020-07-30 NOTE — Patient Instructions (Signed)
Gracias por venir a verme hoy. Por favor haga lo siguiente:  1. Detn a Januvia. 2. Empiece a tomar metformina. Tomar 2 comprimidos despus de la cena. 3. Empiece a tomar Trulicity una vez a la semana. 4. Empiece a tomar Animal nutritionist. Inyectar 60 unidades. 5. Empiece a inyectarse 10 unidades de Novolog antes del almuerzo y 10 unidades de Novolog antes de la cena. Omita si no est comiendo. 6. Contine controlando los niveles de Transport planner. 7. Contine haciendo los cambios de estilo de vida que discutimos juntos durante nuestra visita. La dieta y el ejercicio juegan un papel importante en la mejora de los niveles de Production assistant, radio. 8. Haz un seguimiento conmigo en 1 semana.     Thank you for coming to see me today. Please do the following:  1. Stop Januvia.  2. Start taking metformin. Take 2 tablets after dinner.  3. Start taking Trulicity once a week.  4. Start taking Basaglar once a day. Inject 60 units.  5. Start injecting 10 units of Novolog before lunch and 10 units of Novolog before dinner Skip if not eating.  6. Continue checking blood sugars at home.  7. Continue making the lifestyle changes we've discussed together during our visit. Diet and exercise play a significant role in improving your blood sugars.  8. Follow-up with me in 1 week.

## 2020-07-31 ENCOUNTER — Ambulatory Visit (INDEPENDENT_AMBULATORY_CARE_PROVIDER_SITE_OTHER): Payer: Self-pay | Admitting: Physician Assistant

## 2020-07-31 ENCOUNTER — Encounter: Payer: Self-pay | Admitting: Physician Assistant

## 2020-07-31 VITALS — Ht 62.0 in | Wt 184.0 lb

## 2020-07-31 DIAGNOSIS — M869 Osteomyelitis, unspecified: Secondary | ICD-10-CM

## 2020-07-31 LAB — BASIC METABOLIC PANEL
BUN/Creatinine Ratio: 20 (ref 9–23)
BUN: 26 mg/dL — ABNORMAL HIGH (ref 6–24)
CO2: 25 mmol/L (ref 20–29)
Calcium: 10.2 mg/dL (ref 8.7–10.2)
Chloride: 102 mmol/L (ref 96–106)
Creatinine, Ser: 1.27 mg/dL — ABNORMAL HIGH (ref 0.57–1.00)
GFR calc Af Amer: 55 mL/min/{1.73_m2} — ABNORMAL LOW (ref >59)
GFR calc non Af Amer: 48 mL/min/{1.73_m2} — ABNORMAL LOW (ref >59)
Glucose: 268 mg/dL — ABNORMAL HIGH (ref 65–99)
Potassium: 5.1 mmol/L (ref 3.5–5.2)
Sodium: 140 mmol/L (ref 134–144)

## 2020-07-31 MED ORDER — OXYCODONE-ACETAMINOPHEN 5-325 MG PO TABS
1.0000 | ORAL_TABLET | ORAL | 0 refills | Status: DC | PRN
Start: 2020-07-31 — End: 2020-07-31

## 2020-07-31 MED ORDER — OXYCODONE-ACETAMINOPHEN 5-325 MG PO TABS: 1.0000 | ORAL_TABLET | ORAL | 0 refills | Status: DC | PRN

## 2020-07-31 NOTE — Progress Notes (Signed)
Office Visit Note   Patient: Rebecca [name]           Date of Birth: 12-Apr-1966           MRN: [id number] Visit Date: 07/31/2020              Requested by: Grayce Sessions, NP [address] [place],  [place] [address] PCP: Grayce Sessions, NP  Chief Complaint  Patient presents with  . Right Foot - Routine Post Op    07/05/20 right 2nd ray amputation       HPI: This is a pleasant 54 year old woman who is here today with an interpreter.  She is 3 weeks status post right second ray amputation.  She has also had a great toe amputation.  She does not have any complaints except for some continued pain at nighttime. Assessment & Plan: Visit Diagnoses: No diagnosis found.  Plan: She will continue with gabapentin.  Have given her a refill of her pain medication.  I would like her to still refrain from weightbearing in the postop shoe for the next week.  I have given her prescription for an orthotic with extra-depth shoes.  Follow-up in 2 weeks  Follow-Up Instructions: No follow-ups on file.   Ortho Exam  Patient is alert, oriented, no adenopathy, well-dressed, normal affect, normal respiratory effort. Focused examination demonstrates well-healed surgical incision.  No drainage well approximated wound edges on the bottom part of the wound she does have very thick scabbing.  This was debrided so sutures could be removed.  She does have some superficial wound dehiscence but this is healed over.  There is no drainage no pain no cellulitis  Imaging: No results found. No images are attached to the encounter.  Labs: Lab Results  Component Value Date   HGBA1C 10.1 (A) 07/16/2020   HGBA1C 11.4 (A) 04/15/2020   HGBA1C 9.3 (A) 01/11/2020   ESRSEDRATE 54 (H) 07/18/2017   CRP <0.8 07/18/2017   REPTSTATUS 07/13/2020 FINAL 07/05/2020   GRAMSTAIN  07/05/2020    FEW WBC PRESENT,BOTH PMN AND MONONUCLEAR FEW GRAM POSITIVE COCCI IN PAIRS IN CLUSTERS    CULT  07/05/2020    FEW  STAPHYLOCOCCUS EPIDERMIDIS FEW ENTEROCOCCUS FAECALIS RARE PREVOTELLA SPECIES BETA LACTAMASE POSITIVE    LABORGA STAPHYLOCOCCUS EPIDERMIDIS 07/05/2020   LABORGA ENTEROCOCCUS FAECALIS 07/05/2020     Lab Results  Component Value Date   ALBUMIN 4.5 11/14/2019   ALBUMIN 3.8 07/21/2019   ALBUMIN 4.5 06/22/2019   PREALBUMIN 26.0 07/19/2017    Lab Results  Component Value Date   MG 2.0 11/28/2018   MG 1.8 06/23/2018   MG 2.3 05/13/2015   Lab Results  Component Value Date   VD25OH 28 (L) 01/06/2010   VD25OH 23 (L) 07/02/2009    Lab Results  Component Value Date   PREALBUMIN 26.0 07/19/2017   CBC EXTENDED Latest Ref Rng & Units 07/05/2020 04/15/2020 11/14/2019  WBC 4.0 - 10.5 K/uL 8.0 CANCELED 7.5  RBC 3.87 - 5.11 MIL/uL 4.01 CANCELED 4.54  HGB 12.0 - 15.0 g/dL 11.3(L) CANCELED 13.0  HCT 36 - 46 % 34.8(L) CANCELED 37.8  PLT 150 - 400 K/uL 328 CANCELED 316  NEUTROABS 1.40 - 7.00 x10E3/uL - - 4.9  LYMPHSABS - - CANCELED 1.7     Body mass index is 33.65 kg/m.  Orders:  No orders of the defined types were placed in this encounter.  Meds ordered this encounter  Medications  . DISCONTD: oxyCODONE-acetaminophen (PERCOCET) 5-325 MG tablet  Sig: Take 1 tablet by mouth every 4 (four) hours as needed.    Dispense:  30 tablet    Refill:  0  . oxyCODONE-acetaminophen (PERCOCET) 5-325 MG tablet    Sig: Take 1 tablet by mouth every 4 (four) hours as needed.    Dispense:  30 tablet    Refill:  0     Procedures: No procedures performed  Clinical Data: No additional findings.  ROS:  All other systems negative, except as noted in the HPI. Review of Systems  Objective: Vital Signs: Ht 5\' 2"  (1.575 m)   Wt 184 lb (83.5 kg)   LMP  (LMP Unknown)   BMI 33.65 kg/m   Specialty Comments:  No specialty comments available.  PMFS History: Patient Active Problem List   Diagnosis Date Noted  . Osteomyelitis of second toe of right foot (HCC)   . Ulcer of right foot,  limited to breakdown of skin (HCC) 02/21/2020  . Diarrhea   . Hyperthyroidism   . Insulin dependent diabetes mellitus   . Orthostasis   . Vaginal discharge   . Infectious diarrhea 11/26/2018  . Hyperkalemia 11/26/2018  . Bacteremia 06/21/2018  . Sepsis secondary to UTI (HCC) 06/20/2018  . Right great toe amputee (HCC) 08/05/2017  . Mild renal insufficiency 07/19/2017  . AKI (acute kidney injury) (HCC) 03/04/2016  . Essential hypertension 05/14/2015  . CAD- with distal LAD disease, ? 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 05/14/2015  . NSTEMI (non-ST elevated myocardial infarction) (HCC)   . Syncope 05/13/2015  . Hyperglycemia 10/25/2014  . Abrasion of thigh, left 10/25/2014  . DM (diabetes mellitus), type 2, uncontrolled (HCC)   . DM hyperosmolar coma, type 2 (HCC) 05/22/2012  . Hyperosmolar syndrome 05/21/2012   Past Medical History:  Diagnosis Date  . Anemia   . Diabetes mellitus   . Dyslipidemia   . Hypertension   . Non-STEMI (non-ST elevated myocardial infarction) [facility])    Medical therapy for distal LAD dissection  . Thyroid disease     Family History  Problem Relation Age of Onset  . Hypertension Father   . Hyperlipidemia Father   . Cancer Mother   . Diabetes Sister   . Diabetes Brother   . Diabetes Sister   . Diabetes Sister   . Diabetes Sister   . Diabetes Brother   . Diabetes Brother   . Diabetes Brother   . Diabetes Brother     Past Surgical History:  Procedure Laterality Date  . AMPUTATION Right 07/21/2017   Procedure: RIGHT GREAT TOE AMPUTATION;  Surgeon: 07/23/2017, MD;  Location: [facility] OR;  Service: Orthopedics;  Laterality: Right;  . AMPUTATION Right 07/05/2020   Procedure: RIGHT FOOT 2ND RAY AMPUTATION;  Surgeon: 07/07/2020, MD;  Location: [facility] OR;  Service: Orthopedics;  Laterality: Right;  . CARDIAC CATHETERIZATION N/A 05/14/2015   Procedure: Left Heart Cath and Coronary Angiography;  Surgeon: 05/16/2015, MD; dLAD 60%>>95% (small vessel, ?dissection), CFX &  RCA systems no sig dz, EF & LVEDP nl      Social History   Occupational History  . Occupation: unemployed  Tobacco Use  . Smoking status: Never Smoker  . Smokeless tobacco: Never Used  Vaping Use  . Vaping Use: Never used  Substance and Sexual Activity  . Alcohol use: No  . Drug use: No  . Sexual activity: Yes

## 2020-08-08 ENCOUNTER — Encounter: Payer: Self-pay | Admitting: Pharmacist

## 2020-08-08 ENCOUNTER — Ambulatory Visit: Payer: Self-pay | Attending: Primary Care | Admitting: Pharmacist

## 2020-08-08 ENCOUNTER — Other Ambulatory Visit: Payer: Self-pay

## 2020-08-08 DIAGNOSIS — Z794 Long term (current) use of insulin: Secondary | ICD-10-CM

## 2020-08-08 DIAGNOSIS — E119 Type 2 diabetes mellitus without complications: Secondary | ICD-10-CM

## 2020-08-08 MED FILL — !LANTUS SOLOSTAR 100UNITS/M: 100 | 25 days supply | Qty: 15 | Fill #0

## 2020-08-08 NOTE — Progress Notes (Signed)
    S:    PCP: Marcelino Duster   No chief complaint on file.  Patient arrives well and in good spirits today.  Presents for diabetes evaluation, education, and management. Patient was referred and last seen by Gwinda Passe on 07/16/20. We saw her on 07/30/2020 and added Trulicity. We had her restart metformin and adjusted her insulin  Patient reports Diabetes was diagnosed approximately 24 years ago.   Insurance coverage/medication affordability: Self pay  Medication adherence reported.  Current diabetes medications include:  Metformin 1 g BID   Basaglar 60 units daily   Novolog 10 units BID  Trulicity 0.75 mg weekly   Current hypertension medications include:  HCTZ 25 mg once daily  Losartan 100 mg once daily  Current hyperlipidemia medications include:  Atorvastatin 40 mg daily  Patient denies hypoglycemic events..   Patient reported dietary habits: Eats 2 meals/day  Patient-reported exercise habits: none reported, exercise ability is limited due to recent toe amputation.   O:  ROS   BG today earlier at home: 124 (fasting)  Lab Results  Component Value Date   HGBA1C 10.1 (A) 07/16/2020   There were no vitals filed for this visit.  Lipid Panel     Component Value Date/Time   CHOL 126 07/16/2020 0942   TRIG 139 07/16/2020 0942   HDL 38 (L) 07/16/2020 0942   CHOLHDL 3.3 07/16/2020 0942   CHOLHDL 4.5 07/30/2015 0937   VLDL 26 07/30/2015 0937   LDLCALC 64 07/16/2020 0942    Home fasting blood sugars: gives readings 125 - 150 Occasionally checks post-mea: gives readings 190 - 240  Clinical Atherosclerotic Cardiovascular Disease (ASCVD): Yes  The ASCVD Risk score Denman George DC Jr., et al., 2013) failed to calculate for the following reasons:   The patient has a prior MI or stroke diagnosis    A/P: Diabetes longstanding currently uncontrolled but home glycemic control looks to be improving. Patient is able to verbalize appropriate hypoglycemia management  plan. Medication adherence appears optimal. -Continue current regimen; return in 2 weeks for Trulicity dose increase.  -Extensively discussed pathophysiology of diabetes, recommended lifestyle interventions, dietary effects on blood sugar control -Counseled on s/sx of and management of hypoglycemia -Next A1C anticipated January 2022  ASCVD risk - primary prevention in patient with diabetes. Last LDL is controlled. ASCVD risk score is >20%  - high intensity statin indicated. Aspirin is indicated.  -Continued aspirin 81 mg  -Continued atorvastatin 40 mg.   Hypertension longstanding. Blood pressure goal = 130/80 mmHg. Medication adherence reported. Continued current blood pressure regimen.   Written patient instructions provided.  Total time in face to face counseling 30 minutes.   Follow up Pharmacist Clinic Visit in 2 weeks.     Butch Penny, PharmD, CPP Clinical Pharmacist [facility] [phone]

## 2020-08-14 ENCOUNTER — Encounter: Payer: Self-pay | Admitting: Family

## 2020-08-14 ENCOUNTER — Ambulatory Visit (INDEPENDENT_AMBULATORY_CARE_PROVIDER_SITE_OTHER): Payer: Self-pay | Admitting: Family

## 2020-08-14 VITALS — Ht 62.0 in | Wt 184.0 lb

## 2020-08-14 DIAGNOSIS — M869 Osteomyelitis, unspecified: Secondary | ICD-10-CM

## 2020-08-14 NOTE — Progress Notes (Signed)
Post-Op Visit Note   Patient: Rebecca [name]           Date of Birth: 03-13-1966           MRN: [id number] Visit Date: 08/14/2020 PCP: Grayce Sessions, NP  Chief Complaint:  Chief Complaint  Patient presents with  . Right Foot - Routine Post Op    07/05/20 right foot 2nd ray amputation     HPI:  HPI The patient is a 54 year old woman who presents today status post right second ray amputation on October 15 she has been touchdown weightbearing through her heel using crutches for support has no concerns of her incision states her pain is much better than it had been for months preoperatively Ortho Exam On examination of the right foot her incision is well-healed there is some callus buildup this was debrided with a 10 blade knife back to viable tissue.  There is no erythema no tenderness no sign of infection  Visit Diagnoses:  1. Osteomyelitis of second toe of right foot [facility])     Plan: May advance her weightbearing as tolerated she will follow-up in the office on an as-needed basis  Follow-Up Instructions: Return if symptoms worsen or fail to improve.   Imaging: No results found.  Orders:  No orders of the defined types were placed in this encounter.  No orders of the defined types were placed in this encounter.    PMFS History: Patient Active Problem List   Diagnosis Date Noted  . Osteomyelitis of second toe of right foot (HCC)   . Ulcer of right foot, limited to breakdown of skin (HCC) 02/21/2020  . Diarrhea   . Hyperthyroidism   . Insulin dependent diabetes mellitus   . Orthostasis   . Vaginal discharge   . Infectious diarrhea 11/26/2018  . Hyperkalemia 11/26/2018  . Bacteremia 06/21/2018  . Sepsis secondary to UTI (HCC) 06/20/2018  . Right great toe amputee (HCC) 08/05/2017  . Mild renal insufficiency 07/19/2017  . AKI (acute kidney injury) (HCC) 03/04/2016  . Essential hypertension 05/14/2015  . CAD- with distal LAD disease, ? dissection  05/14/15 05/14/2015  . NSTEMI (non-ST elevated myocardial infarction) (HCC)   . Syncope 05/13/2015  . Hyperglycemia 10/25/2014  . Abrasion of thigh, left 10/25/2014  . DM (diabetes mellitus), type 2, uncontrolled (HCC)   . DM hyperosmolar coma, type 2 (HCC) 05/22/2012  . Hyperosmolar syndrome 05/21/2012   Past Medical History:  Diagnosis Date  . Anemia   . Diabetes mellitus   . Dyslipidemia   . Hypertension   . Non-STEMI (non-ST elevated myocardial infarction) [facility])    Medical therapy for distal LAD dissection  . Thyroid disease     Family History  Problem Relation Age of Onset  . Hypertension Father   . Hyperlipidemia Father   . Cancer Mother   . Diabetes Sister   . Diabetes Brother   . Diabetes Sister   . Diabetes Sister   . Diabetes Sister   . Diabetes Brother   . Diabetes Brother   . Diabetes Brother   . Diabetes Brother     Past Surgical History:  Procedure Laterality Date  . AMPUTATION Right 07/21/2017   Procedure: RIGHT GREAT TOE AMPUTATION;  Surgeon: Nadara Mustard, MD;  Location: [facility] OR;  Service: Orthopedics;  Laterality: Right;  . AMPUTATION Right 07/05/2020   Procedure: RIGHT FOOT 2ND RAY AMPUTATION;  Surgeon: Nadara Mustard, MD;  Location: [facility] OR;  Service: Orthopedics;  Laterality: Right;  .  CARDIAC CATHETERIZATION N/A 05/14/2015   Procedure: Left Heart Cath and Coronary Angiography;  Surgeon: Marykay Lex, MD; dLAD 60%>>95% (small vessel, ?dissection), CFX & RCA systems no sig dz, EF & LVEDP nl      Social History   Occupational History  . Occupation: unemployed  Tobacco Use  . Smoking status: Never Smoker  . Smokeless tobacco: Never Used  Vaping Use  . Vaping Use: Never used  Substance and Sexual Activity  . Alcohol use: No  . Drug use: No  . Sexual activity: Yes

## 2020-08-19 MED FILL — GABAPENTIN 600 MG TABLET: 600 | 30 days supply | Qty: 180 | Fill #0

## 2020-08-23 ENCOUNTER — Ambulatory Visit: Payer: Self-pay | Attending: Primary Care | Admitting: Pharmacist

## 2020-08-23 ENCOUNTER — Encounter: Payer: Self-pay | Admitting: Pharmacist

## 2020-08-23 ENCOUNTER — Other Ambulatory Visit: Payer: Self-pay | Admitting: Family Medicine

## 2020-08-23 ENCOUNTER — Other Ambulatory Visit: Payer: Self-pay

## 2020-08-23 DIAGNOSIS — E1165 Type 2 diabetes mellitus with hyperglycemia: Secondary | ICD-10-CM

## 2020-08-23 MED ORDER — TRULICITY 1.5 MG/0.5ML ~~LOC~~ SOAJ: 1.5000 mg | SUBCUTANEOUS | 2 refills | Status: DC

## 2020-08-23 MED ORDER — GABAPENTIN 400 MG PO CAPS
400.0000 mg | ORAL_CAPSULE | Freq: Three times a day (TID) | ORAL | 1 refills | Status: DC
Start: 2020-08-23 — End: 2020-11-27

## 2020-08-23 MED FILL — GABAPENTIN 400 MG CAPSULE: 400 | 30 days supply | Qty: 90 | Fill #0

## 2020-08-23 MED FILL — ?TRULICITY 1.5 MG/0.5 ML PE: 1.5 | 28 days supply | Qty: 2 | Fill #0

## 2020-08-23 NOTE — Progress Notes (Signed)
    S:    PCP: Marcelino Duster   No chief complaint on file.  Patient arrives well and in good spirits today.  Presents for diabetes evaluation, education, and management. Patient was referred and last seen by Gwinda Passe on 07/16/20. We saw her on 08/08/2020 but made no medication changes.   Patient reports diabetes was diagnosed approximately 24 years ago.   Insurance coverage/medication affordability: Self pay  Medication adherence reported.  Current diabetes medications include:  Metformin 1 g BID   Basaglar 60 units daily   Novolog 10 units BID  Trulicity 0.75 mg weekly   Current hypertension medications include:  HCTZ 25 mg once daily  Losartan 100 mg once daily  Current hyperlipidemia medications include:  Atorvastatin 40 mg daily  Patient denies hypoglycemic events..   Patient reported dietary habits: Eats 2 meals/day  Patient-reported exercise habits: none reported, exercise ability is limited due to recent toe amputation.   O:   Lab Results  Component Value Date   HGBA1C 10.1 (A) 07/16/2020   There were no vitals filed for this visit.  Lipid Panel     Component Value Date/Time   CHOL 126 07/16/2020 0942   TRIG 139 07/16/2020 0942   HDL 38 (L) 07/16/2020 0942   CHOLHDL 3.3 07/16/2020 0942   CHOLHDL 4.5 07/30/2015 0937   VLDL 26 07/30/2015 0937   LDLCALC 64 07/16/2020 0942   Home fasting blood sugars: gives readings 125 - 150 Occasionally checks post-meal: gives readings 190 - 240  Clinical Atherosclerotic Cardiovascular Disease (ASCVD): Yes  The ASCVD Risk score Denman George DC Jr., et al., 2013) failed to calculate for the following reasons:   The patient has a prior MI or stroke diagnosis    A/P: Diabetes longstanding currently uncontrolled but home glycemic control looks to be improving. Patient is able to verbalize appropriate hypoglycemia management plan. Medication adherence appears optimal. -Increased dose of Trulicity to 1.5 mg weekly.     -Extensively discussed pathophysiology of diabetes, recommended lifestyle interventions, dietary effects on blood sugar control -Counseled on s/sx of and management of hypoglycemia -Next A1C anticipated January 2022  ASCVD risk - primary prevention in patient with diabetes. Last LDL is controlled. ASCVD risk score is >20%  - high intensity statin indicated.  -Continued atorvastatin 40 mg.   Hypertension longstanding. Blood pressure goal = 130/80 mmHg. Medication adherence reported. Continued current blood pressure regimen.   Written patient instructions provided.  Total time in face to face counseling 30 minutes.   Follow up Pharmacist Clinic Visit in 2 weeks.     Butch Penny, PharmD, [place]y [place], CPP Clinical Pharmacist [facility] [phone]

## 2020-08-27 MED FILL — LOSARTAN POTASSIUM 100 MG T: 100 | 30 days supply | Qty: 30 | Fill #1

## 2020-09-02 ENCOUNTER — Other Ambulatory Visit (INDEPENDENT_AMBULATORY_CARE_PROVIDER_SITE_OTHER): Payer: Self-pay | Admitting: Primary Care

## 2020-09-02 DIAGNOSIS — E7841 Elevated Lipoprotein(a): Secondary | ICD-10-CM

## 2020-09-02 DIAGNOSIS — Z760 Encounter for issue of repeat prescription: Secondary | ICD-10-CM

## 2020-09-02 MED FILL — METHIMAZOLE 5 MG TABS: 5 | 30 days supply | Qty: 12 | Fill #7

## 2020-09-02 MED FILL — ATORVASTATIN CALCIUM 40 MG: 40 | 30 days supply | Qty: 30 | Fill #0

## 2020-09-02 MED FILL — ?BASAGLAR 100 UNITS/ML KWPE: 100 | 25 days supply | Qty: 15 | Fill #1

## 2020-09-04 ENCOUNTER — Other Ambulatory Visit (HOSPITAL_COMMUNITY): Payer: Self-pay | Admitting: Dentistry

## 2020-09-11 ENCOUNTER — Other Ambulatory Visit: Payer: Self-pay

## 2020-09-11 ENCOUNTER — Ambulatory Visit (INDEPENDENT_AMBULATORY_CARE_PROVIDER_SITE_OTHER): Payer: Self-pay | Admitting: Physician Assistant

## 2020-09-11 ENCOUNTER — Encounter: Payer: Self-pay | Admitting: Physician Assistant

## 2020-09-11 DIAGNOSIS — Z89411 Acquired absence of right great toe: Secondary | ICD-10-CM

## 2020-09-11 NOTE — Progress Notes (Signed)
Office Visit Note   Patient: Rebecca [name]           Date of Birth: 1966/04/16           MRN: [id number] Visit Date: 09/11/2020              Requested by: Grayce Sessions, NP [address] [place],  [place] [address] PCP: Grayce Sessions, NP  Chief Complaint  Patient presents with  . Right Foot - Routine Post Op    07/05/20 2nd ray amputation       HPI: Patient is a little over 2 months status post right foot second ray amputation.  She has no complaints.  She is having new shoes and inserts made.  Assessment & Plan: Visit Diagnoses: No diagnosis found.  Plan: May follow-up as needed  Follow-Up Instructions: No follow-ups on file.   Ortho Exam  Patient is alert, oriented, no adenopathy, well-dressed, normal affect, normal respiratory effort. Right foot completely healed incision.  Swelling is well controlled no maceration of the skin.  No pressure points of the skin.  No cellulitis no signs of infection  Imaging: No results found. No images are attached to the encounter.  Labs: Lab Results  Component Value Date   HGBA1C 10.1 (A) 07/16/2020   HGBA1C 11.4 (A) 04/15/2020   HGBA1C 9.3 (A) 01/11/2020   ESRSEDRATE 54 (H) 07/18/2017   CRP <0.8 07/18/2017   REPTSTATUS 07/13/2020 FINAL 07/05/2020   GRAMSTAIN  07/05/2020    FEW WBC PRESENT,BOTH PMN AND MONONUCLEAR FEW GRAM POSITIVE COCCI IN PAIRS IN CLUSTERS    CULT  07/05/2020    FEW STAPHYLOCOCCUS EPIDERMIDIS FEW ENTEROCOCCUS FAECALIS RARE PREVOTELLA SPECIES BETA LACTAMASE POSITIVE    LABORGA STAPHYLOCOCCUS EPIDERMIDIS 07/05/2020   LABORGA ENTEROCOCCUS FAECALIS 07/05/2020     Lab Results  Component Value Date   ALBUMIN 4.5 11/14/2019   ALBUMIN 3.8 07/21/2019   ALBUMIN 4.5 06/22/2019   PREALBUMIN 26.0 07/19/2017    Lab Results  Component Value Date   MG 2.0 11/28/2018   MG 1.8 06/23/2018   MG 2.3 05/13/2015   Lab Results  Component Value Date   VD25OH 28 (L) 01/06/2010   VD25OH  23 (L) 07/02/2009    Lab Results  Component Value Date   PREALBUMIN 26.0 07/19/2017   CBC EXTENDED Latest Ref Rng & Units 07/05/2020 04/15/2020 11/14/2019  WBC 4.0 - 10.5 K/uL 8.0 CANCELED 7.5  RBC 3.87 - 5.11 MIL/uL 4.01 CANCELED 4.54  HGB 12.0 - 15.0 g/dL 11.3(L) CANCELED 13.0  HCT 36.0 - 46.0 % 34.8(L) CANCELED 37.8  PLT 150 - 400 K/uL 328 CANCELED 316  NEUTROABS 1.4 - 7.0 x10E3/uL - - 4.9  LYMPHSABS - - CANCELED 1.7     There is no height or weight on file to calculate BMI.  Orders:  No orders of the defined types were placed in this encounter.  No orders of the defined types were placed in this encounter.    Procedures: No procedures performed  Clinical Data: No additional findings.  ROS:  All other systems negative, except as noted in the HPI. Review of Systems  Objective: Vital Signs: LMP  (LMP Unknown)   Specialty Comments:  No specialty comments available.  PMFS History: Patient Active Problem List   Diagnosis Date Noted  . Osteomyelitis of second toe of right foot (HCC)   . Ulcer of right foot, limited to breakdown of skin (HCC) 02/21/2020  . Diarrhea   . Hyperthyroidism   .  Insulin dependent diabetes mellitus   . Orthostasis   . Vaginal discharge   . Infectious diarrhea 11/26/2018  . Hyperkalemia 11/26/2018  . Bacteremia 06/21/2018  . Sepsis secondary to UTI (HCC) 06/20/2018  . Right great toe amputee (HCC) 08/05/2017  . Mild renal insufficiency 07/19/2017  . AKI (acute kidney injury) (HCC) 03/04/2016  . Essential hypertension 05/14/2015  . CAD- with distal LAD disease, ? 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 05/14/2015  . NSTEMI (non-ST elevated myocardial infarction) (HCC)   . Syncope 05/13/2015  . Hyperglycemia 10/25/2014  . Abrasion of thigh, left 10/25/2014  . DM (diabetes mellitus), type 2, uncontrolled (HCC)   . DM hyperosmolar coma, type 2 (HCC) 05/22/2012  . Hyperosmolar syndrome 05/21/2012   Past Medical History:  Diagnosis Date  . Anemia   .  Diabetes mellitus   . Dyslipidemia   . Hypertension   . Non-STEMI (non-ST elevated myocardial infarction) [facility])    Medical therapy for distal LAD dissection  . Thyroid disease     Family History  Problem Relation Age of Onset  . Hypertension Father   . Hyperlipidemia Father   . Cancer Mother   . Diabetes Sister   . Diabetes Brother   . Diabetes Sister   . Diabetes Sister   . Diabetes Sister   . Diabetes Brother   . Diabetes Brother   . Diabetes Brother   . Diabetes Brother     Past Surgical History:  Procedure Laterality Date  . AMPUTATION Right 07/21/2017   Procedure: RIGHT GREAT TOE AMPUTATION;  Surgeon: Nadara Mustard, MD;  Location: [facility] OR;  Service: Orthopedics;  Laterality: Right;  . AMPUTATION Right 07/05/2020   Procedure: RIGHT FOOT 2ND RAY AMPUTATION;  Surgeon: Nadara Mustard, MD;  Location: [facility] OR;  Service: Orthopedics;  Laterality: Right;  . CARDIAC CATHETERIZATION N/A 05/14/2015   Procedure: Left Heart Cath and Coronary Angiography;  Surgeon: Marykay Lex, MD; dLAD 60%>>95% (small vessel, ?dissection), CFX & RCA systems no sig dz, EF & LVEDP nl      Social History   Occupational History  . Occupation: unemployed  Tobacco Use  . Smoking status: Never Smoker  . Smokeless tobacco: Never Used  Vaping Use  . Vaping Use: Never used  Substance and Sexual Activity  . Alcohol use: No  . Drug use: No  . Sexual activity: Yes

## 2020-09-17 ENCOUNTER — Telehealth: Payer: Self-pay | Admitting: Orthopedic Surgery

## 2020-09-17 NOTE — Telephone Encounter (Signed)
Pts Daughter Francesco Runner called stating the pt had her toe amputated and they recently spoke with a Dr. Who recommend she try PT because her nerves were "bunched up". The pt would like to have a referral put in for this and a CB to let her know where the referral will be sent and the # to that facility.   [phone]* will need an interpreter  [phone] Runner

## 2020-09-18 ENCOUNTER — Other Ambulatory Visit: Payer: Self-pay

## 2020-09-18 DIAGNOSIS — Z89411 Acquired absence of right great toe: Secondary | ICD-10-CM

## 2020-09-18 NOTE — Telephone Encounter (Signed)
07/05/20 right 2nd ray amputation. Please see message below. Do you want to make this referral?

## 2020-09-18 NOTE — Telephone Encounter (Signed)
Ok, send in rx for PT, evaluate and treat

## 2020-09-18 NOTE — Telephone Encounter (Signed)
Order in chart

## 2020-09-24 MED FILL — !TRULICITY 1.5 MG/0.5 ML PE: 1.5 | 28 days supply | Qty: 2 | Fill #1

## 2020-09-27 ENCOUNTER — Encounter: Payer: Self-pay | Admitting: Physical Therapy

## 2020-09-27 ENCOUNTER — Other Ambulatory Visit: Payer: Self-pay

## 2020-09-27 ENCOUNTER — Ambulatory Visit: Payer: Self-pay | Attending: Primary Care | Admitting: Physical Therapy

## 2020-09-27 DIAGNOSIS — R2689 Other abnormalities of gait and mobility: Secondary | ICD-10-CM | POA: Insufficient documentation

## 2020-09-27 DIAGNOSIS — R262 Difficulty in walking, not elsewhere classified: Secondary | ICD-10-CM | POA: Insufficient documentation

## 2020-09-27 DIAGNOSIS — M6281 Muscle weakness (generalized): Secondary | ICD-10-CM | POA: Insufficient documentation

## 2020-09-27 DIAGNOSIS — M25571 Pain in right ankle and joints of right foot: Secondary | ICD-10-CM | POA: Insufficient documentation

## 2020-09-27 DIAGNOSIS — R600 Localized edema: Secondary | ICD-10-CM | POA: Insufficient documentation

## 2020-09-27 NOTE — Therapy (Signed)
[facility] [address] [place], [place], [address] Phone: [phone]   Fax:  [phone]  Physical Therapy Evaluation  Patient Details  Name: Rebecca [name] MRN: [id number] Date of Birth: [birth year] Referring Provider (PT): Nadara Mustard, MD   Encounter Date: 09/27/2020   PT End of Session - 09/27/20 1028    Visit Number 1    Number of Visits 5    Date for PT Re-Evaluation 10/25/20    Authorization Type Self pay?    PT Start Time 1025    PT Stop Time 1110    PT Time Calculation (min) 45 min    Activity Tolerance Patient tolerated treatment well    Behavior During Therapy WFL for tasks assessed/performed           Past Medical History:  Diagnosis Date  . Anemia   . Diabetes mellitus   . Dyslipidemia   . Hypertension   . Non-STEMI (non-ST elevated myocardial infarction) [facility])    Medical therapy for distal LAD dissection  . Thyroid disease     Past Surgical History:  Procedure Laterality Date  . AMPUTATION Right 07/21/2017   Procedure: RIGHT GREAT TOE AMPUTATION;  Surgeon: Nadara Mustard, MD;  Location: [facility] OR;  Service: Orthopedics;  Laterality: Right;  . AMPUTATION Right 07/05/2020   Procedure: RIGHT FOOT 2ND RAY AMPUTATION;  Surgeon: Nadara Mustard, MD;  Location: [facility] OR;  Service: Orthopedics;  Laterality: Right;  . CARDIAC CATHETERIZATION N/A 05/14/2015   Procedure: Left Heart Cath and Coronary Angiography;  Surgeon: Marykay Lex, MD; dLAD 60%>>95% (small vessel, ?dissection), CFX & RCA systems no sig dz, EF & LVEDP nl       There were no vitals filed for this visit.    Subjective Assessment - 09/27/20 1028    Subjective Pt is Spanish speaking and requires an interpreter. Pt reports she had a big and 2nd toe amputation in Oct and Nov 2021. Pt reports difficulty walking. Pt gets pain. Pt states her toes are fully healed but it gets sore. Pt reports constant right foot swelling.    Patient  is accompained by: Interpreter    Pertinent History DM, HTN, hyperlipidemia    Limitations Standing;Walking    How long can you sit comfortably? n/a    How long can you stand comfortably? She's not standing much at home but believes she can stand for 30 minutes    How long can you walk comfortably? Last Tuesday, started walking more to pick up her son -- with 8 minutes she felt a lot of pain    Patient Stated Goals Improve pain while walking    Currently in Pain? Yes    Pain Score 8     Pain Location Foot    Pain Orientation Right    Pain Descriptors / Indicators Sharp;Throbbing;Stabbing    Pain Type Surgical pain    Pain Radiating Towards heel    Pain Onset More than a month ago    Pain Frequency Constant    Aggravating Factors  Walking    Pain Relieving Factors Massage              [facility] PT Assessment - 09/27/20 0001      Assessment   Medical Diagnosis Z89.411 (ICD-10-CM) - Right great toe amputee (HCC) on 10/15    Referring Provider (PT) Nadara Mustard, MD    Onset Date/Surgical Date --   2 surgery dates for Great toe and then  second toe between Oct and Nov 2021   Hand Dominance Right    Prior Therapy None      Precautions   Precautions Fall      Restrictions   Weight Bearing Restrictions No      Balance Screen   Has the patient fallen in the past 6 months No      Home Environment   Living Environment Private residence    Living Arrangements Children      Prior Function   Level of Independence Independent      Observation/Other Assessments-Edema    Edema Figure 8      Figure 8 Edema   Figure 8 - Right  50 cm    Figure 8 - Left  47 cm      Sensation   Light Touch Impaired by gross assessment    Proprioception Appears Intact      Functional Tests   Functional tests Single leg stance      Single Leg Stance   Comments R pelvic drop; decreased stability and pain      AROM   Right Ankle Dorsiflexion 5    Right Ankle Plantar Flexion 40    Right Ankle  Inversion --   WFL   Right Ankle Eversion --   [facility]     Strength   Right Ankle Dorsiflexion 4+/5    Right Ankle Plantar Flexion 4+/5    Right Ankle Inversion 4-/5    Right Ankle Eversion 3+/5      Palpation   Palpation comment TTP along R posterior tibialis, heel, and arch. Otherwise, mobile and flexible R foot.      Transfers   Transfers Sit to Stand;Stand to Sit    Sit to Stand 7: Independent;With upper extremity assist;From bed    Sit to Stand Details (indicate cue type and reason) Requires use of bilat UEs      Ambulation/Gait   Ambulation Distance (Feet) 965 Feet    Assistive device None    Gait Pattern Step-through pattern;Decreased dorsiflexion - right;Right foot flat;Trendelenburg;Lateral trunk lean to right;Wide base of support    Ambulation Surface Level;Indoor    Gait velocity 2.68 ft/sec      6 Minute walk- Post Test   6 Minute Walk Post Test yes      6 minute walk test results    Aerobic Endurance Distance Walked 956    Endurance additional comments medial ankle pain within 2 min; R hip pain after 5 min                      Objective measurements completed on examination: See above findings.       [facility] Adult PT Treatment/Exercise - 09/27/20 0001      Self-Care   Self-Care RICE;Heat/Ice Application;Other Self-Care Comments    RICE Discussed ice and elevation for continued edema    Other Self-Care Comments  Discussed k-tape      Manual Therapy   Manual Therapy Taping    Kinesiotex Edema;Facilitate Muscle      Kinesiotix   Edema Bilat ankle    Facilitate Muscle  R Tibialis posterior                  PT Education - 09/27/20 1119    Education Details Discussed ice and elevation for her continued foot swelling. Pt has already been using compression socks. Discussed kinesiotape for added support and additional decrease in swelling. Discussed exam findings and POC  Person(s) Educated Patient    Methods Explanation;Verbal cues     Comprehension Verbalized understanding            PT Short Term Goals - 09/27/20 1133      PT SHORT TERM GOAL #1   Title PT to assess 5x STS for functional strength on next PT visit    Time 1    Period Weeks    Status New    Target Date 10/04/20      PT SHORT TERM GOAL #2   Title PT will measure pt's balance via Merrilee Jansky or Mini BESTest    Time 1    Period Weeks    Status New    Target Date 10/04/20             PT Long Term Goals - 09/27/20 1135      PT LONG TERM GOAL #1   Title Pt will be independent with HEP for hip and foot/ankle strengthening    Time 4    Period Weeks    Status New    Target Date 10/25/20      PT LONG TERM GOAL #2   Title Pt will be able to increase her 6 MWT to at least 1200 ft to demo [place] with </=3/10 foot and hip pain    Baseline 965'    Time 4    Period Weeks    Status New    Target Date 10/25/20      PT LONG TERM GOAL #3   Title Pt will be able to walk her son for school without pain    Time 4    Period Weeks    Status New    Target Date 10/25/20      PT LONG TERM GOAL #4   Title Pt will report overall 50% reduction of her edema    Baseline 47 cm on L; 50 cm on R with figure 8    Time 4    Period Weeks    Status New    Target Date 10/25/20                  Plan - 09/27/20 1122    Clinical Impression Statement Ms. Sampson Si is a 55 y/o F presenting to OPPT s/p R first and second toe amputation in Oct/Nov 2021. Pt with history of DM with neuropathy, hypertension, and hyperlipidemia. Pt demos decreased R ankle, foot, and hip strength/stability with increased edema. Pt found to have decreased gait mechanics with R medial ankle/arch pain and R hip pain and decreased endurance/gait speed indicating likely R posterior tibialis dysfunction/overuse with hip compensation. On 6 MWT and gait speed pscores less than age norms. Applied k-tape this session to address pt's edema and facilitate use of R posterior tibialis. Pt would  benefit from PT to address her muscular dysfunction and optimize her level of function.    Personal Factors and Comorbidities Age;Time since onset of injury/illness/exacerbation    Examination-Activity Limitations Locomotion Level;Stairs;Caring for Others    Examination-Participation Restrictions Cleaning;Community Activity    Stability/Clinical Decision Making Evolving/Moderate complexity    Clinical Decision Making Low    Rehab Potential Good    PT Frequency 1x / week    PT Duration 4 weeks    PT Treatment/Interventions ADLs/Self Care Home Management;Aquatic Therapy;Electrical Stimulation;Cryotherapy;Moist Heat;Iontophoresis 4mg /ml Dexamethasone;DME Instruction;Gait training;Stair training;Functional mobility training;Therapeutic activities;Therapeutic exercise;Balance training;Neuromuscular re-education;Orthotic Fit/Training;Patient/family education;Manual techniques;Energy conservation;Passive range of motion;Taping;Vasopneumatic Device    PT Next Visit Plan Assess 5xSTS  and balance. Initiate posterior tib strengthening and R hip strengthening. Consider taping if pt found any benefit from eval. Discuss/evaluate shoe for improved arch and ankle support    Consulted and Agree with Plan of Care Patient           Patient will benefit from skilled therapeutic intervention in order to improve the following deficits and impairments:  Abnormal gait,Difficulty walking,Decreased endurance,Pain,Decreased balance,Improper body mechanics,Decreased mobility,Decreased strength,Increased edema,Impaired sensation  Visit Diagnosis: Difficulty in walking, not elsewhere classified  Localized edema  Muscle weakness (generalized)  Other abnormalities of gait and mobility  Pain in right ankle and joints of right foot     Problem List Patient Active Problem List   Diagnosis Date Noted  . Osteomyelitis of second toe of right foot (HCC)   . Ulcer of right foot, limited to breakdown of skin (HCC)  02/21/2020  . Diarrhea   . Hyperthyroidism   . Insulin dependent diabetes mellitus   . Orthostasis   . Vaginal discharge   . Infectious diarrhea 11/26/2018  . Hyperkalemia 11/26/2018  . Bacteremia 06/21/2018  . Sepsis secondary to UTI (HCC) 06/20/2018  . Right great toe amputee (HCC) 08/05/2017  . Mild renal insufficiency 07/19/2017  . AKI (acute kidney injury) (HCC) 03/04/2016  . Essential hypertension 05/14/2015  . CAD- with distal LAD disease, ? 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 05/14/2015  . NSTEMI (non-ST elevated myocardial infarction) (HCC)   . Syncope 05/13/2015  . Hyperglycemia 10/25/2014  . Abrasion of thigh, left 10/25/2014  . DM (diabetes mellitus), type 2, uncontrolled (HCC)   . DM hyperosmolar coma, type 2 (HCC) 05/22/2012  . Hyperosmolar syndrome 05/21/2012    Shenandoah Yeats April Ma L Aniela Caniglia PT, DPT 09/27/2020, 11:40 AM  [facility] [address] [place], [place], [address] Phone: [phone]   Fax:  [phone]  Name: DESTYNI HOPPEL MRN: [id number] Date of Birth: 28-Oct-1965

## 2020-10-01 ENCOUNTER — Other Ambulatory Visit: Payer: Self-pay

## 2020-10-01 DIAGNOSIS — Z20822 Contact with and (suspected) exposure to covid-19: Secondary | ICD-10-CM

## 2020-10-01 MED FILL — ?BASAGLAR 100 UNITS/ML KWPE: 100 | 25 days supply | Qty: 15 | Fill #2

## 2020-10-01 MED FILL — LOSARTAN POTASSIUM 100 MG T: 100 | 30 days supply | Qty: 30 | Fill #2

## 2020-10-01 MED FILL — METHIMAZOLE 5 MG TABS: 5 | 30 days supply | Qty: 12 | Fill #8

## 2020-10-03 LAB — NOVEL CORONAVIRUS, NAA: SARS-CoV-2, NAA: NOT DETECTED

## 2020-10-03 LAB — SARS-COV-2, NAA 2 DAY TAT

## 2020-10-04 ENCOUNTER — Ambulatory Visit: Payer: Self-pay | Admitting: Physical Therapy

## 2020-10-09 ENCOUNTER — Ambulatory Visit: Payer: Self-pay | Admitting: Physical Therapy

## 2020-10-09 ENCOUNTER — Other Ambulatory Visit: Payer: Self-pay

## 2020-10-09 DIAGNOSIS — M6281 Muscle weakness (generalized): Secondary | ICD-10-CM

## 2020-10-09 DIAGNOSIS — R2689 Other abnormalities of gait and mobility: Secondary | ICD-10-CM

## 2020-10-09 DIAGNOSIS — M25571 Pain in right ankle and joints of right foot: Secondary | ICD-10-CM

## 2020-10-09 DIAGNOSIS — R262 Difficulty in walking, not elsewhere classified: Secondary | ICD-10-CM

## 2020-10-09 DIAGNOSIS — R600 Localized edema: Secondary | ICD-10-CM

## 2020-10-09 MED FILL — ?ATORVASTATIN 40MG TABLET: 40 | 30 days supply | Qty: 30 | Fill #1

## 2020-10-09 NOTE — Therapy (Signed)
[facility] [address] [place], [place], [address] Phone: [phone]   Fax:  [phone]  Physical Therapy Treatment  Patient Details  Name: Rebecca [name] MRN: [id number] Date of Birth: 1966-06-14 Referring Provider (PT): Nadara Mustard, MD   Encounter Date: 10/09/2020   PT End of Session - 10/09/20 1016    Visit Number 2    Number of Visits 5    Date for PT Re-Evaluation 10/25/20    Authorization Type Self pay?    PT Start Time 1017    PT Stop Time 1105    PT Time Calculation (min) 48 min    Activity Tolerance Patient tolerated treatment well    Behavior During Therapy WFL for tasks assessed/performed           Past Medical History:  Diagnosis Date  . Anemia   . Diabetes mellitus   . Dyslipidemia   . Hypertension   . Non-STEMI (non-ST elevated myocardial infarction) [facility])    Medical therapy for distal LAD dissection  . Thyroid disease     Past Surgical History:  Procedure Laterality Date  . AMPUTATION Right 07/21/2017   Procedure: RIGHT GREAT TOE AMPUTATION;  Surgeon: Nadara Mustard, MD;  Location: [facility] OR;  Service: Orthopedics;  Laterality: Right;  . AMPUTATION Right 07/05/2020   Procedure: RIGHT FOOT 2ND RAY AMPUTATION;  Surgeon: Nadara Mustard, MD;  Location: [facility] OR;  Service: Orthopedics;  Laterality: Right;  . CARDIAC CATHETERIZATION N/A 05/14/2015   Procedure: Left Heart Cath and Coronary Angiography;  Surgeon: Marykay Lex, MD; dLAD 60%>>95% (small vessel, ?dissection), CFX & RCA systems no sig dz, EF & LVEDP nl       There were no vitals filed for this visit.   Subjective Assessment - 10/09/20 1026    Subjective Pt reports she's doing better since the last time she was seen. Pt states the taping helped a little bit. Pt states her stabbing/stinging pain has decreased. Pt reports swelling has remained.    Patient is accompained by: Interpreter    Pertinent History DM, HTN,  hyperlipidemia    Limitations Standing;Walking    How long can you sit comfortably? n/a    How long can you stand comfortably? She's not standing much at home but believes she can stand for 30 minutes    How long can you walk comfortably? Last Tuesday, started walking more to pick up her son -- with 8 minutes she felt a lot of pain    Patient Stated Goals Improve pain while walking    Currently in Pain? No/denies    Pain Onset More than a month ago              [facility] PT Assessment - 10/09/20 0001      Standardized Balance Assessment   Standardized Balance Assessment Five Times Sit to Stand;Mini-BESTest    Five times sit to stand comments  11.48 sec      Mini-BESTest   Sit To Stand Normal: Comes to stand without use of hands and stabilizes independently.    Rise to Toes Moderate: Heels up, but not full range (smaller than when holding hands), OR noticeable instability for 3 s.    Stand on one leg (left) Moderate: < 20 s   ~3 sec bilat   Stand on one leg (right) Moderate: < 20 s    Stand on one leg - lowest score 1    Compensatory Stepping Correction - Forward Moderate:  More than one step is required to recover equilibrium    Compensatory Stepping Correction - Backward Moderate: More than one step is required to recover equilibrium    Compensatory Stepping Correction - Left Lateral Normal: Recovers independently with 1 step (crossover or lateral OK)    Compensatory Stepping Correction - Right Lateral Normal: Recovers independently with 1 step (crossover or lateral OK)    Stepping Corredtion Lateral - lowest score 2    Stance - Feet together, eyes open, firm surface  Normal: 30s    Stance - Feet together, eyes closed, foam surface  Moderate: < 30s    Incline - Eyes Closed Normal: Stands independently 30s and aligns with gravity    Change in Gait Speed Normal: Significantly changes walkling speed without imbalance    Walk with head turns - Horizontal Normal: performs head turns with no  change in gait speed and good balance    Walk with pivot turns Moderate:Turns with feet close SLOW (>4 steps) with good balance.    Step over obstacles Normal: Able to step over box with minimal change of gait speed and with good balance.    Timed UP & GO with Dual Task Moderate: Dual Task affects either counting OR walking (>10%) when compared to the TUG without Dual Task.    Mini-BEST total score 21                         [facility] Adult PT Treatment/Exercise - 10/09/20 0001      Knee/Hip Exercises: Standing   Other Standing Knee Exercises lateral band walk 2x10    Other Standing Knee Exercises forward/backward monster walk 2x10      Ankle Exercises: Seated   Heel Raises Right;20 reps    Other Seated Ankle Exercises 4 way ankle with green tband 2x10                    PT Short Term Goals - 10/09/20 1109      PT SHORT TERM GOAL #1   Title PT to assess 5x STS for functional strength on next PT visit    Time 1    Period Weeks    Status Achieved    Target Date 10/04/20      PT SHORT TERM GOAL #2   Title PT will measure pt's balance via [place] or Mini BESTest    Time 1    Period Weeks    Status Achieved    Target Date 10/04/20             PT Long Term Goals - 09/27/20 1135      PT LONG TERM GOAL #1   Title Pt will be independent with HEP for hip and foot/ankle strengthening    Time 4    Period Weeks    Status New    Target Date 10/25/20      PT LONG TERM GOAL #2   Title Pt will be able to increase her 6 MWT to at least 1200 ft to demo [place] with </=3/10 foot and hip pain    Baseline 965'    Time 4    Period Weeks    Status New    Target Date 10/25/20      PT LONG TERM GOAL #3   Title Pt will be able to walk her son for school without pain    Time 4    Period Weeks    Status New  Target Date 10/25/20      PT LONG TERM GOAL #4   Title Pt will report overall 50% reduction of her edema    Baseline 47 cm on L; 50 cm on R with figure 8     Time 4    Period Weeks    Status New    Target Date 10/25/20                 Plan - 10/09/20 1107    Clinical Impression Statement Treatment session focused on establishing ankle, hip, and balance HEP. Pt assessed for functional strength via 5x STS -- pt able to demo adequate functional strength with score of 11.48 sec. Mini Bestest performed and pt with score of 21/28 demonstrating greatest challenge with anticipatory balance and sensory orientation. Pt demonstrates good improvement towards goals.    Personal Factors and Comorbidities Age;Time since onset of injury/illness/exacerbation    Examination-Activity Limitations Locomotion Level;Stairs;Caring for Others    Examination-Participation Restrictions Cleaning;Community Activity    Stability/Clinical Decision Making Evolving/Moderate complexity    Rehab Potential Good    PT Frequency 1x / week    PT Duration 4 weeks    PT Treatment/Interventions ADLs/Self Care Home Management;Aquatic Therapy;Electrical Stimulation;Cryotherapy;Moist Heat;Iontophoresis 4mg /ml Dexamethasone;DME Instruction;Gait training;Stair training;Functional mobility training;Therapeutic activities;Therapeutic exercise;Balance training;Neuromuscular re-education;Orthotic Fit/Training;Patient/family education;Manual techniques;Energy conservation;Passive range of motion;Taping;Vasopneumatic Device    PT Next Visit Plan Continue ankle/hip strengthening and balance. Consider taping if pt found any benefit from eval. Discuss/evaluate shoe for improved arch and ankle support    PT Home Exercise Plan Access Code [email or web address]    Consulted and Agree with Plan of Care Patient           Patient will benefit from skilled therapeutic intervention in order to improve the following deficits and impairments:  Abnormal gait,Difficulty walking,Decreased endurance,Pain,Decreased balance,Improper body mechanics,Decreased mobility,Decreased strength,Increased edema,Impaired  sensation  Visit Diagnosis: Difficulty in walking, not elsewhere classified  Localized edema  Muscle weakness (generalized)  Other abnormalities of gait and mobility  Pain in right ankle and joints of right foot     Problem List Patient Active Problem List   Diagnosis Date Noted  . Osteomyelitis of second toe of right foot (HCC)   . Ulcer of right foot, limited to breakdown of skin (HCC) 02/21/2020  . Diarrhea   . Hyperthyroidism   . Insulin dependent diabetes mellitus   . Orthostasis   . Vaginal discharge   . Infectious diarrhea 11/26/2018  . Hyperkalemia 11/26/2018  . Bacteremia 06/21/2018  . Sepsis secondary to UTI (HCC) 06/20/2018  . Right great toe amputee (HCC) 08/05/2017  . Mild renal insufficiency 07/19/2017  . AKI (acute kidney injury) (HCC) 03/04/2016  . Essential hypertension 05/14/2015  . CAD- with distal LAD disease, ? 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 05/14/2015  . NSTEMI (non-ST elevated myocardial infarction) (HCC)   . Syncope 05/13/2015  . Hyperglycemia 10/25/2014  . Abrasion of thigh, left 10/25/2014  . DM (diabetes mellitus), type 2, uncontrolled (HCC)   . DM hyperosmolar coma, type 2 (HCC) 05/22/2012  . Hyperosmolar syndrome 05/21/2012    Machi Whittaker April Ma L Morley PT, DPT 10/09/2020, 2:27 PM  [facility] [address] [place], [place], [place] Phone: [phone]   Fax:  [phone]  Name: Rebecca [name] MRN: Williams Che Date of Birth: 01-Jul-1966

## 2020-10-09 NOTE — Patient Instructions (Signed)
Access Code: [email or web address] URL: [email or web address] Date: 10/09/2020 Prepared by: Vernon Prey April Kirstie Peri  Exercises Heel rises with counter support - 1 x daily - 7 x weekly - 2 sets - 10 reps Seated Ankle Inversion with Resistance and Legs Crossed - 1 x daily - 7 x weekly - 2 sets - 10 reps Seated Ankle Eversion with Resistance - 1 x daily - 7 x weekly - 2 sets - 10 reps Seated Ankle Eversion with Resistance - 1 x daily - 7 x weekly - 2 sets - 10 reps Romberg Stance with Head Nods on Foam Pad - 1 x daily - 7 x weekly - 30 sec hold Romberg Stance on Foam Pad with Head Rotation - 1 x daily - 7 x weekly - 30 sec hold Side Stepping with Resistance at Thighs and Counter Support - 1 x daily - 7 x weekly - 2 sets - 10 reps

## 2020-10-15 ENCOUNTER — Ambulatory Visit: Payer: Self-pay

## 2020-10-15 ENCOUNTER — Other Ambulatory Visit: Payer: Self-pay

## 2020-10-15 DIAGNOSIS — R2689 Other abnormalities of gait and mobility: Secondary | ICD-10-CM

## 2020-10-15 DIAGNOSIS — R262 Difficulty in walking, not elsewhere classified: Secondary | ICD-10-CM

## 2020-10-15 DIAGNOSIS — M6281 Muscle weakness (generalized): Secondary | ICD-10-CM

## 2020-10-15 NOTE — Therapy (Signed)
[facility] [address] [place], [place], [address] Phone: [phone]   Fax:  [phone]  Physical Therapy Treatment  Patient Details  Name: Rebecca [name] MRN: [id number] Date of Birth: 09/17/1966 Referring Provider (PT): Nadara Mustard, MD   Encounter Date: 10/15/2020   PT End of Session - 10/15/20 1016    Visit Number 3    Number of Visits 5    Date for PT Re-Evaluation 10/25/20    Authorization Type CAFA    PT Start Time 1016    PT Stop Time 1058    PT Time Calculation (min) 42 min    Activity Tolerance Patient tolerated treatment well    Behavior During Therapy [facility] for tasks assessed/performed           Past Medical History:  Diagnosis Date  . Anemia   . Diabetes mellitus   . Dyslipidemia   . Hypertension   . Non-STEMI (non-ST elevated myocardial infarction) [facility])    Medical therapy for distal LAD dissection  . Thyroid disease     Past Surgical History:  Procedure Laterality Date  . AMPUTATION Right 07/21/2017   Procedure: RIGHT GREAT TOE AMPUTATION;  Surgeon: Nadara Mustard, MD;  Location: [facility] OR;  Service: Orthopedics;  Laterality: Right;  . AMPUTATION Right 07/05/2020   Procedure: RIGHT FOOT 2ND RAY AMPUTATION;  Surgeon: Nadara Mustard, MD;  Location: [facility] OR;  Service: Orthopedics;  Laterality: Right;  . CARDIAC CATHETERIZATION N/A 05/14/2015   Procedure: Left Heart Cath and Coronary Angiography;  Surgeon: Marykay Lex, MD; dLAD 60%>>95% (small vessel, ?dissection), CFX & RCA systems no sig dz, EF & LVEDP nl       There were no vitals filed for this visit.   Subjective Assessment - 10/15/20 1020    Subjective The exercises are going well per patient reports. No pain currently. Patient also reports that swelling has went down.    Patient is accompained by: Interpreter    Pertinent History DM, HTN, hyperlipidemia    Limitations Standing;Walking    How long can you sit comfortably?  n/a    How long can you stand comfortably? She's not standing much at home but believes she can stand for 30 minutes    How long can you walk comfortably? Last Tuesday, started walking more to pick up her son -- with 8 minutes she felt a lot of pain    Patient Stated Goals Improve pain while walking    Currently in Pain? No/denies    Pain Onset More than a month ago                             [facility] Adult PT Treatment/Exercise - 10/15/20 0001      Ambulation/Gait   Ambulation/Gait Yes    Ambulation/Gait Assistance 6: Modified independent (Device/Increase time)    Assistive device None    Gait Pattern Step-through pattern;Decreased dorsiflexion - right;Right foot flat;Trendelenburg;Lateral trunk lean to right;Wide base of support    Ambulation Surface Level;Indoor      Exercises   Exercises Other Exercises    Other Exercises  Completed resisted DF with green TB on B ankle, 2 x 10 reps. Completed resisted PF with green TB on B ankle, 2 x 10. Vebral cues for form and pace with completion. Completed resisted ankle eversion with green TB, 2 x 10 reps. Intermittent rest breaks required.      Knee/Hip  Exercises: Standing   Heel Raises Both;2 sets;10 reps;3 seconds    Heel Raises Limitations completed with light UE support in // bars    Other Standing Knee Exercises completed standing toe raises, 2 x 10 reps. light UE support, verbal cues to avoid leaning back with hips during completion.               Balance Exercises - 10/15/20 0001      Balance Exercises: Standing   Standing Eyes Opened Narrow base of support (BOS);Head turns;Foam/compliant surface;Limitations    Standing Eyes Opened Limitations completed standing narrow BOS, completed 2 x 10 reps of horizontal head/vertical head turns.    Standing Eyes Closed Narrow base of support (BOS);Foam/compliant surface;3 reps;30 secs;Limitations    Standing Eyes Closed Limitations increased sway noted with EC, 3 x 30  seconds.    Tandem Stance Eyes open;Intermittent upper extremity support;3 reps;30 secs;Limitations    Tandem Stance Time completed 2 x 30 seconds with EO, then progressed to completing 2 x 30 seconds with horizontal head turns EO.    SLS with Vectors Solid surface;Limitations    SLS with Vectors Limitations standing on firm surface, completed forward toe taps to 6" steps x 15 reps. then progressed to crossover x 15 reps. Increased challenge noted with crossover, CGA required throughout.    Stepping Strategy Anterior;Posterior;Foam/compliant surface;Limitations    Stepping Strategy Limitations standing across blue balance beam, completed anterior/posterior stepping strategy x 10 reps. intermittnet UE support required and CGA throughout.               PT Short Term Goals - 10/09/20 1109      PT SHORT TERM GOAL #1   Title PT to assess 5x STS for functional strength on next PT visit    Time 1    Period Weeks    Status Achieved    Target Date 10/04/20      PT SHORT TERM GOAL #2   Title PT will measure pt's balance via Berg or Mini BESTest    Time 1    Period Weeks    Status Achieved    Target Date 10/04/20             PT Long Term Goals - 09/27/20 1135      PT LONG TERM GOAL #1   Title Pt will be independent with HEP for hip and foot/ankle strengthening    Time 4    Period Weeks    Status New    Target Date 10/25/20      PT LONG TERM GOAL #2   Title Pt will be able to increase her 6 MWT to at least 1200 ft to demo MDC with </=3/10 foot and hip pain    Baseline 965'    Time 4    Period Weeks    Status New    Target Date 10/25/20      PT LONG TERM GOAL #3   Title Pt will be able to walk her son for school without pain    Time 4    Period Weeks    Status New    Target Date 10/25/20      PT LONG TERM GOAL #4   Title Pt will report overall 50% reduction of her edema    Baseline 47 cm on L; 50 cm on R with figure 8    Time 4    Period Weeks    Status New     Target Date 10/25/20  Plan - 10/15/20 1341    Clinical Impression Statement Continued session focused on balance training and ankle strengthing to patient's tolerance. Increased challenge on complaint surfaces and with vision removed. will continue to progress toward all LTGs.    Personal Factors and Comorbidities Age;Time since onset of injury/illness/exacerbation    Examination-Activity Limitations Locomotion Level;Stairs;Caring for Others    Examination-Participation Restrictions Cleaning;Community Activity    Stability/Clinical Decision Making Evolving/Moderate complexity    Rehab Potential Good    PT Frequency 1x / week    PT Duration 4 weeks    PT Treatment/Interventions ADLs/Self Care Home Management;Aquatic Therapy;Electrical Stimulation;Cryotherapy;Moist Heat;Iontophoresis 4mg /ml Dexamethasone;DME Instruction;Gait training;Stair training;Functional mobility training;Therapeutic activities;Therapeutic exercise;Balance training;Neuromuscular re-education;Orthotic Fit/Training;Patient/family education;Manual techniques;Energy conservation;Passive range of motion;Taping;Vasopneumatic Device    PT Next Visit Plan Check Goals + Re-Cert. Continue ankle/hip strengthening and balance. Consider taping if pt found any benefit from eval. Discuss/evaluate shoe for improved arch and ankle support    PT Home Exercise Plan Access Code [email or web address]    Consulted and Agree with Plan of Care Patient           Patient will benefit from skilled therapeutic intervention in order to improve the following deficits and impairments:  Abnormal gait,Difficulty walking,Decreased endurance,Pain,Decreased balance,Improper body mechanics,Decreased mobility,Decreased strength,Increased edema,Impaired sensation  Visit Diagnosis: Difficulty in walking, not elsewhere classified  Muscle weakness (generalized)  Other abnormalities of gait and mobility     Problem List Patient Active Problem  List   Diagnosis Date Noted  . Osteomyelitis of second toe of right foot (HCC)   . Ulcer of right foot, limited to breakdown of skin (HCC) 02/21/2020  . Diarrhea   . Hyperthyroidism   . Insulin dependent diabetes mellitus   . Orthostasis   . Vaginal discharge   . Infectious diarrhea 11/26/2018  . Hyperkalemia 11/26/2018  . Bacteremia 06/21/2018  . Sepsis secondary to UTI (HCC) 06/20/2018  . Right great toe amputee (HCC) 08/05/2017  . Mild renal insufficiency 07/19/2017  . AKI (acute kidney injury) (HCC) 03/04/2016  . Essential hypertension 05/14/2015  . CAD- with distal LAD disease, ? 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 05/14/2015  . NSTEMI (non-ST elevated myocardial infarction) (HCC)   . Syncope 05/13/2015  . Hyperglycemia 10/25/2014  . Abrasion of thigh, left 10/25/2014  . DM (diabetes mellitus), type 2, uncontrolled (HCC)   . DM hyperosmolar coma, type 2 (HCC) 05/22/2012  . Hyperosmolar syndrome 05/21/2012    05/23/2012, PT, DPT 10/15/2020, 1:43 PM  [facility] [address] [place], [place], [place] Phone: [phone]   Fax:  [phone]  Name: Rebecca [name] MRN: Williams Che Date of Birth: [birth year]

## 2020-10-16 ENCOUNTER — Ambulatory Visit (INDEPENDENT_AMBULATORY_CARE_PROVIDER_SITE_OTHER): Payer: Self-pay | Admitting: Primary Care

## 2020-10-16 ENCOUNTER — Other Ambulatory Visit: Payer: Self-pay

## 2020-10-16 ENCOUNTER — Other Ambulatory Visit (INDEPENDENT_AMBULATORY_CARE_PROVIDER_SITE_OTHER): Payer: Self-pay | Admitting: Primary Care

## 2020-10-16 ENCOUNTER — Encounter (INDEPENDENT_AMBULATORY_CARE_PROVIDER_SITE_OTHER): Payer: Self-pay | Admitting: Primary Care

## 2020-10-16 VITALS — BP 144/89 | HR 98 | Temp 96.4°F | Ht 62.0 in | Wt 178.0 lb

## 2020-10-16 DIAGNOSIS — I10 Essential (primary) hypertension: Secondary | ICD-10-CM

## 2020-10-16 DIAGNOSIS — E782 Mixed hyperlipidemia: Secondary | ICD-10-CM

## 2020-10-16 DIAGNOSIS — E0590 Thyrotoxicosis, unspecified without thyrotoxic crisis or storm: Secondary | ICD-10-CM

## 2020-10-16 DIAGNOSIS — E1165 Type 2 diabetes mellitus with hyperglycemia: Secondary | ICD-10-CM

## 2020-10-16 LAB — POCT GLYCOSYLATED HEMOGLOBIN (HGB A1C): Hemoglobin A1C: 7.4 % — AB (ref 4.0–5.6)

## 2020-10-16 MED ORDER — AMLODIPINE BESYLATE 10 MG PO TABS: 10.0000 mg | ORAL_TABLET | Freq: Every day | ORAL | 3 refills | Status: DC

## 2020-10-16 MED FILL — AMLODIPINE BESYLATE 10 MG T: 10 | 30 days supply | Qty: 30 | Fill #0

## 2020-10-16 NOTE — Progress Notes (Signed)
Rebecca [name] is a 55 y.o. female who presents for an follow up evaluation of Type 2 diabetes mellitus.  Current symptoms/problems include none and have been stable.  Current diabetic medications include Trulicity 1.5 weekly.Basiglar , Novolog 10 units lunch and supper   The patient was initially diagnosed with Type 2 diabetes mellitus based on the following criteria:  A1C 7.4  Current monitoring regimen: none Home blood sugar records:no  Any episodes of hypoglycemia? no  Known diabetic complications: none Cardiovascular risk factors: diabetes mellitus, dyslipidemia and hypertension Eye exam current (within one year): no Weight trend: stable Prior visit with CDE: yes -  Current diet: in general, a "healthy" diet   Current exercise: walking Medication Compliance?  Yes   Is She on ACE inhibitor or angiotensin II receptor blocker?  Yes   losartan (Cozaar)  The following portions of the patient's history were reviewed and updated as appropriate: past family history, past medical history, past social history, past surgical history and problem list.  Review of Systems  All other systems reviewed and are negative.   Objective:    BP (!) 144/89 (BP Location: Right Arm, Patient Position: Sitting, Cuff Size: Normal)   Pulse 98   Temp (!) 96.4 F (35.8 C) (Temporal)   Ht 5\' 2"  (1.575 m)   Wt 178 lb (80.7 kg)   LMP  (LMP Unknown)   SpO2 100%   BMI 32.56 kg/m   Physical Exam Vitals reviewed.  Constitutional:      Appearance: She is obese.  HENT:     Head: Normocephalic.     Right Ear: Tympanic membrane and external ear normal.     Left Ear: Tympanic membrane and external ear normal.     Nose: Nose normal.  Eyes:     Extraocular Movements: Extraocular movements intact.     Pupils: Pupils are equal, round, and reactive to light.  Cardiovascular:     Rate and Rhythm: Normal rate and regular rhythm.  Pulmonary:     Effort: Pulmonary effort is normal.      Breath sounds: Normal breath sounds.  Abdominal:     General: Bowel sounds are normal. There is distension.     Palpations: Abdomen is soft.  Musculoskeletal:        General: Normal range of motion.     Cervical back: Normal range of motion and neck supple.  Skin:    General: Skin is warm and dry.  Neurological:     Mental Status: She is alert and oriented to person, place, and time.  Psychiatric:        Mood and Affect: Mood normal.        Behavior: Behavior normal.        Thought Content: Thought content normal.        Judgment: Judgment normal.      Lab Review Glucose (mg/dL)  Date Value  268 (H)  11/14/2019 391 (H)  06/22/2019 321 (H)   Glucose, Bld (mg/dL)  Date Value  08/22/2019 195 (H)  07/21/2019 281 (H)  02/08/2019 481 (H)   CO2 (mmol/L)  Date Value  07/30/2020 25  07/05/2020 22  11/14/2019 25   BUN (mg/dL)  Date Value  11/16/2019 26 (H)  07/05/2020 33 (H)  11/14/2019 30 (H)  07/21/2019 29 (H)  06/22/2019 44 (H)   Creat (mg/dL)  Date Value  08/22/2019 1.13 (H)   Creatinine, Ser (mg/dL)  Date Value  97/98/9211 1.27 (H)  07/05/2020 1.21 (H)  11/14/2019 1.39 (H)      Assessment:    Diabetes Mellitus type II, under good control.   [name] was seen today for diabetes.  Diagnoses and all orders for this visit:  Uncontrolled type 2 diabetes mellitus with hyperglycemia (HCC) -     HgB A1c 7.4 improved from 10.1 very please.  Closer to glycemic control of 6.7. Continue to monitor foods that are high in carbohydrates are the following rice, potatoes, breads, sugars, and pastas.  Reduction in the intake (eating) will assist in lowering your blood sugars. -     Comprehensive metabolic panel  Mixed hyperlipidemia  Decrease your fatty foods, red meat, cheese, milk and increase fiber like whole grains and veggies. You can also add a fiber supplement like Metamucil or Benefiber.  -     Lipid Panel  Essential hypertension Remains elevated  add addition Bp medication amlodipine ask pharmacy with help to know what it is for by color coding.  Blood pressure goal of less than 130/80, low-sodium, DASH diet, medication compliance, 150 minutes of moderate intensity exercise per week. Discussed medication compliance, adverse effects. -     CBC with Differential  Plan:    1.  Rx changes: none 2.  Education: Reviewed 'ABCs' of diabetes management (respective goals in parentheses):  A1C (<7), blood pressure (<130/80), and cholesterol (LDL <100). 3. CHO counting diet discussed.  Reviewed CHO amount in various foods and how to read nutrition labels.  Discussed recommended serving sizes.  5.  Patient can not read or write 6.  Recommended increase physical activity - goal is 150 minutes per week 7. Follow up: 3 month

## 2020-10-17 ENCOUNTER — Other Ambulatory Visit (INDEPENDENT_AMBULATORY_CARE_PROVIDER_SITE_OTHER): Payer: Self-pay | Admitting: Primary Care

## 2020-10-17 DIAGNOSIS — E875 Hyperkalemia: Secondary | ICD-10-CM

## 2020-10-17 LAB — COMPREHENSIVE METABOLIC PANEL
ALT: 28 IU/L (ref 0–32)
AST: 24 IU/L (ref 0–40)
Albumin/Globulin Ratio: 1.3 (ref 1.2–2.2)
Albumin: 4.2 g/dL (ref 3.8–4.9)
Alkaline Phosphatase: 173 IU/L — ABNORMAL HIGH (ref 44–121)
BUN/Creatinine Ratio: 29 — ABNORMAL HIGH (ref 9–23)
BUN: 30 mg/dL — ABNORMAL HIGH (ref 6–24)
Bilirubin Total: 0.3 mg/dL (ref 0.0–1.2)
CO2: 22 mmol/L (ref 20–29)
Calcium: 10 mg/dL (ref 8.7–10.2)
Chloride: 106 mmol/L (ref 96–106)
Creatinine, Ser: 1.05 mg/dL — ABNORMAL HIGH (ref 0.57–1.00)
GFR calc Af Amer: 70 mL/min/{1.73_m2} (ref >59)
GFR calc non Af Amer: 60 mL/min/{1.73_m2} (ref >59)
Globulin, Total: 3.3 g/dL (ref 1.5–4.5)
Glucose: 92 mg/dL (ref 65–99)
Potassium: 6 mmol/L — ABNORMAL HIGH (ref 3.5–5.2)
Sodium: 140 mmol/L (ref 134–144)
Total Protein: 7.5 g/dL (ref 6.0–8.5)

## 2020-10-17 LAB — CBC WITH DIFFERENTIAL/PLATELET
Basophils Absolute: 0.1 10*3/uL (ref 0.0–0.2)
Basos: 1 %
EOS (ABSOLUTE): 0.2 10*3/uL (ref 0.0–0.4)
Eos: 3 %
Hematocrit: 32.8 % — ABNORMAL LOW (ref 34.0–46.6)
Hemoglobin: 11 g/dL — ABNORMAL LOW (ref 11.1–15.9)
Immature Grans (Abs): 0 10*3/uL (ref 0.0–0.1)
Immature Granulocytes: 0 %
Lymphocytes Absolute: 2.5 10*3/uL (ref 0.7–3.1)
Lymphs: 31 %
MCH: 27.6 pg (ref 26.6–33.0)
MCHC: 33.5 g/dL (ref 31.5–35.7)
MCV: 82 fL (ref 79–97)
Monocytes Absolute: 0.7 10*3/uL (ref 0.1–0.9)
Monocytes: 8 %
Neutrophils Absolute: 4.5 10*3/uL (ref 1.4–7.0)
Neutrophils: 57 %
Platelets: 380 10*3/uL (ref 150–450)
RBC: 3.98 x10E6/uL (ref 3.77–5.28)
RDW: 13.1 % (ref 11.7–15.4)
WBC: 8 10*3/uL (ref 3.4–10.8)

## 2020-10-17 LAB — LIPID PANEL
Chol/HDL Ratio: 5 ratio — ABNORMAL HIGH (ref 0.0–4.4)
Cholesterol, Total: 160 mg/dL (ref 100–199)
HDL: 32 mg/dL — ABNORMAL LOW (ref >39)
LDL Chol Calc (NIH): 93 mg/dL (ref 0–99)
Triglycerides: 206 mg/dL — ABNORMAL HIGH (ref 0–149)
VLDL Cholesterol Cal: 35 mg/dL (ref 5–40)

## 2020-10-17 LAB — TSH+FREE T4
Free T4: 1.14 ng/dL (ref 0.82–1.77)
TSH: 2.1 u[IU]/mL (ref 0.450–4.500)

## 2020-10-17 MED ORDER — SODIUM POLYSTYRENE SULFONATE 15 GM/60ML PO SUSP: 15.0000 g | Freq: Once | ORAL | 0 refills | Status: DC

## 2020-10-17 MED ORDER — SODIUM POLYSTYRENE SULFONATE PO POWD
Freq: Once | ORAL | 0 refills | Status: DC
Start: 2020-10-17 — End: 2020-10-17

## 2020-10-17 MED FILL — SPS 15 GM/60 ML SUSPENSION: 15 | 60 days supply | Qty: 60 | Fill #0

## 2020-10-18 ENCOUNTER — Telehealth (INDEPENDENT_AMBULATORY_CARE_PROVIDER_SITE_OTHER): Payer: Self-pay

## 2020-10-18 MED FILL — GABAPENTIN 400 MG CAPSULE: 400 | 30 days supply | Qty: 90 | Fill #1

## 2020-10-18 MED FILL — ?ATORVASTATIN 40MG TABLET: 40 | 30 days supply | Qty: 30 | Fill #1

## 2020-10-18 MED FILL — !TRULICITY 1.5 MG/0.5 ML PE: 1.5 | 28 days supply | Qty: 2 | Fill #2

## 2020-10-18 NOTE — Telephone Encounter (Signed)
-----   Message from Grayce Sessions, NP sent at 10/17/2020  1:47 PM EST ----- Potassium is elevated will send in kayexalate this can be caused by Losartan. Will need to consider changing Bp medication but this has to be done in person and she brings all her bottles of medication.

## 2020-10-18 NOTE — Telephone Encounter (Signed)
Patient contacted with interpreter. She is aware that her potassium is low and medication has been sent. Losartan could cause potassium to be low. Consider changing but would need to have OV and bring all medications to visit. She verbalized understanding and will bring medications to next OV in march. Maryjean Morn, CMA

## 2020-10-22 ENCOUNTER — Ambulatory Visit: Payer: Self-pay

## 2020-10-25 ENCOUNTER — Ambulatory Visit: Payer: Self-pay | Attending: Primary Care

## 2020-10-29 ENCOUNTER — Other Ambulatory Visit: Payer: Self-pay | Admitting: Family Medicine

## 2020-10-29 DIAGNOSIS — E1165 Type 2 diabetes mellitus with hyperglycemia: Secondary | ICD-10-CM

## 2020-10-29 MED FILL — ?BASAGLAR 100 UNITS/ML KWPE: 100 | 25 days supply | Qty: 15 | Fill #0

## 2020-10-29 MED FILL — $novoLOG FLEXPEN SYRINGE: 100 | 50 days supply | Qty: 15 | Fill #0

## 2020-11-06 MED FILL — LOSARTAN POTASSIUM 100 MG T: 100 | 30 days supply | Qty: 30 | Fill #3

## 2020-11-06 MED FILL — METHIMAZOLE 5 MG TABS: 5 | 30 days supply | Qty: 15 | Fill #0

## 2020-11-26 ENCOUNTER — Other Ambulatory Visit: Payer: Self-pay | Admitting: Family Medicine

## 2020-11-26 ENCOUNTER — Other Ambulatory Visit (INDEPENDENT_AMBULATORY_CARE_PROVIDER_SITE_OTHER): Payer: Self-pay | Admitting: Primary Care

## 2020-11-26 DIAGNOSIS — E1165 Type 2 diabetes mellitus with hyperglycemia: Secondary | ICD-10-CM

## 2020-11-26 DIAGNOSIS — Z760 Encounter for issue of repeat prescription: Secondary | ICD-10-CM

## 2020-11-26 DIAGNOSIS — E7841 Elevated Lipoprotein(a): Secondary | ICD-10-CM

## 2020-11-26 MED FILL — ?BASAGLAR 100 UNITS/ML KWPE: 100 | 25 days supply | Qty: 15 | Fill #1

## 2020-11-26 MED FILL — ?TRULICITY 1.5 MG/0.5 ML PE: 1.5 | 28 days supply | Qty: 2 | Fill #0

## 2020-11-26 MED FILL — GABAPENTIN 400 MG CAPSULE: 400 | 30 days supply | Qty: 90 | Fill #2

## 2020-11-26 MED FILL — ?ATORVASTATIN 40MG TABLET: 40 | 30 days supply | Qty: 30 | Fill #0

## 2020-11-26 MED FILL — AMLODIPINE BESYLATE 10 MG T: 10 | 30 days supply | Qty: 30 | Fill #1

## 2020-11-26 NOTE — Telephone Encounter (Signed)
Requested Prescriptions  Pending Prescriptions Disp Refills  . atorvastatin (LIPITOR) 40 MG tablet [Pharmacy Med Name: ATORVASTATIN 40MG  TABLET 40 Tablet] 90 tablet 3    Sig: TAKE 1 TABLET (40 MG TOTAL) BY MOUTH AT BEDTIME.     Cardiovascular:  Antilipid - Statins Failed - 11/26/2020 12:12 PM      Failed - LDL in normal range and within 360 days    LDL Chol Calc (NIH)  Date Value Ref Range Status  10/16/2020 93 0 - 99 mg/dL Final         Failed - HDL in normal range and within 360 days    HDL  Date Value Ref Range Status  10/16/2020 32 (L) >39 mg/dL Final         Failed - Triglycerides in normal range and within 360 days    Triglycerides  Date Value Ref Range Status  10/16/2020 206 (H) 0 - 149 mg/dL Final         Passed - Total Cholesterol in normal range and within 360 days    Cholesterol, Total  Date Value Ref Range Status  10/16/2020 160 100 - 199 mg/dL Final         Passed - Patient is not pregnant      Passed - Valid encounter within last 12 months    Recent Outpatient Visits          1 month ago Uncontrolled type 2 diabetes mellitus with hyperglycemia (HCC)   [facility] 10/18/2020 P, NP   3 months ago Uncontrolled type 2 diabetes mellitus with hyperglycemia [facility])   [facility] [place], KOOMBERKINE L, RPH-CPP   3 months ago Type 2 diabetes mellitus without complication, with long-term current use of insulin [facility])   [facility] [place], KOOMBERKINE L, RPH-CPP   3 months ago Uncontrolled type 2 diabetes mellitus with hyperglycemia [facility])   [facility] [place], KOOMBERKINE L, RPH-CPP   4 months ago Uncontrolled type 2 diabetes mellitus with hyperglycemia (HCC)   [facility], NP      Future Appointments            Tomorrow Grayce Sessions, NP [facility]   In 1 month [facility], NP [facility]

## 2020-11-27 ENCOUNTER — Other Ambulatory Visit: Payer: Self-pay

## 2020-11-27 ENCOUNTER — Other Ambulatory Visit (INDEPENDENT_AMBULATORY_CARE_PROVIDER_SITE_OTHER): Payer: Self-pay | Admitting: Primary Care

## 2020-11-27 ENCOUNTER — Encounter (INDEPENDENT_AMBULATORY_CARE_PROVIDER_SITE_OTHER): Payer: Self-pay | Admitting: Primary Care

## 2020-11-27 ENCOUNTER — Ambulatory Visit (INDEPENDENT_AMBULATORY_CARE_PROVIDER_SITE_OTHER): Payer: Self-pay | Admitting: Primary Care

## 2020-11-27 VITALS — BP 156/90 | HR 87 | Temp 97.3°F | Ht 62.0 in | Wt 180.0 lb

## 2020-11-27 DIAGNOSIS — I10 Essential (primary) hypertension: Secondary | ICD-10-CM

## 2020-11-27 DIAGNOSIS — Z794 Long term (current) use of insulin: Secondary | ICD-10-CM

## 2020-11-27 DIAGNOSIS — E0590 Thyrotoxicosis, unspecified without thyrotoxic crisis or storm: Secondary | ICD-10-CM

## 2020-11-27 DIAGNOSIS — Z760 Encounter for issue of repeat prescription: Secondary | ICD-10-CM

## 2020-11-27 DIAGNOSIS — E7841 Elevated Lipoprotein(a): Secondary | ICD-10-CM

## 2020-11-27 DIAGNOSIS — E1165 Type 2 diabetes mellitus with hyperglycemia: Secondary | ICD-10-CM

## 2020-11-27 DIAGNOSIS — E119 Type 2 diabetes mellitus without complications: Secondary | ICD-10-CM

## 2020-11-27 LAB — GLUCOSE, POCT (MANUAL RESULT ENTRY): POC Glucose: 142 mg/dl — AB (ref 70–99)

## 2020-11-27 MED ORDER — METHIMAZOLE 5 MG PO TABS: 5.0000 mg | ORAL_TABLET | ORAL | 1 refills | Status: DC

## 2020-11-27 MED ORDER — AMLODIPINE BESYLATE 10 MG PO TABS: 10.0000 mg | ORAL_TABLET | Freq: Every day | ORAL | 3 refills | Status: DC

## 2020-11-27 MED ORDER — METFORMIN HCL ER 500 MG PO TB24: 500.0000 mg | ORAL_TABLET | Freq: Two times a day (BID) | ORAL | 2 refills | Status: DC

## 2020-11-27 MED ORDER — ATORVASTATIN CALCIUM 40 MG PO TABS: 40.0000 mg | ORAL_TABLET | Freq: Every day | ORAL | 3 refills | Status: DC

## 2020-11-27 MED ORDER — GABAPENTIN 400 MG PO CAPS: 400.0000 mg | ORAL_CAPSULE | Freq: Four times a day (QID) | ORAL | 1 refills | Status: DC

## 2020-11-27 MED ORDER — LOSARTAN POTASSIUM 100 MG PO TABS: 100.0000 mg | ORAL_TABLET | Freq: Every day | ORAL | 1 refills | Status: DC

## 2020-11-27 MED ORDER — BASAGLAR KWIKPEN 100 UNIT/ML ~~LOC~~ SOPN: 60.0000 [IU] | PEN_INJECTOR | Freq: Every day | SUBCUTANEOUS | 2 refills | Status: DC

## 2020-11-27 MED ORDER — TRULICITY 1.5 MG/0.5ML ~~LOC~~ SOAJ: 1.5000 mg | SUBCUTANEOUS | 3 refills | Status: DC

## 2020-11-27 MED ORDER — GABAPENTIN 400 MG PO CAPS: 400.0000 mg | ORAL_CAPSULE | Freq: Three times a day (TID) | ORAL | 1 refills | Status: DC

## 2020-11-27 MED FILL — METHIMAZOLE 5 MG TABS: 5 | 35 days supply | Qty: 15 | Fill #1

## 2020-11-27 MED FILL — LOSARTAN POTASSIUM 100 MG T: 100 | 30 days supply | Qty: 30 | Fill #4

## 2020-11-27 MED FILL — metFORMIN HCL ER 500 MG TB2: 500 | 30 days supply | Qty: 60 | Fill #0

## 2020-11-27 NOTE — Progress Notes (Signed)
Subjective:  Patient ID: Rebecca [name], female    DOB: 1966-01-24  Age: 55 y.o. MRN: [id number]  CC: diabetic teaching    HPI Kamiya I Luper presents forFollow-up of diabetes. Patient does not check blood sugar at home.  Patient has been out of medications.  She went to have them filled but they did not have colors on them so she did not pick them up.  She brings all her medications in today empty.  Call to the pharmacy to have them refilled and explained to patient I will color her pill bottles and teach her how to give new medication Trulicity.  Note patient is not able to read so we have a system where the color red is for her blood pressure medicine, the color blue is for her diabetes medication and she knows what insulin is, and yellow is for her thyroid medication.  The color pink is cholesterol medication  Compliant with meds - No Checking CBGs? No  Fasting avg -   Postprandial average -  Exercising regularly? - Yes Watching carbohydrate intake? - Yes Neuropathy ? - Yes Hypoglycemic events - No  - Recovers with :   Pertinent ROS:  Polyuria - No Polydipsia - No Vision problems - No  Medications as noted below. Taking them regularly without complication/adverse reaction being reported today.   History Agripina has a past medical history of Anemia, Diabetes mellitus, Dyslipidemia, Hypertension, Non-STEMI (non-ST elevated myocardial infarction) (HCC), and Thyroid disease.   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   Her family history includes Cancer in her mother; Diabetes in her brother, brother, brother, brother, brother, sister, sister, sister, and sister; Hyperlipidemia in her father; Hypertension in her father.She reports that she has never smoked. She has never used smokeless tobacco. She reports that she does not drink alcohol and does not use  drugs.  Current Outpatient Medications on File Prior to Visit  Medication Sig Dispense Refill  . Insulin Pen Needle (PEN NEEDLES) 31G X 8 MM MISC Inject 1 each into the skin at bedtime. 90 each 1  . aspirin 81 MG EC tablet Take 1 tablet (81 mg total) by mouth daily. 90 tablet 1  . oxyCODONE-acetaminophen (PERCOCET) 5-325 MG tablet Take 1 tablet by mouth every 4 (four) hours as needed. 30 tablet 0  . polyethylene glycol (MIRALAX / GLYCOLAX) 17 g packet Take 17 g by mouth daily. 14 each 3   No current facility-administered medications on file prior to visit.    ROS Review of Systems  All other systems reviewed and are negative.   Objective:  BP (!) 156/90 (BP Location: Right Arm, Patient Position: Sitting, Cuff Size: Normal)   Pulse 87   Temp (!) 97.3 F (36.3 C) (Temporal)   Ht 5\' 2"  (1.575 m)   Wt 180 lb (81.6 kg)   LMP  (LMP Unknown)   SpO2 100%   BMI 32.92 kg/m   BP Readings from Last 3 Encounters:  11/27/20 (!) 156/90  10/16/20 (!) 144/89  07/16/20 135/83    Wt Readings from Last 3 Encounters:  11/27/20 180 lb (81.6 kg)  10/16/20 178 lb (80.7 kg)  08/14/20 184 lb (83.5 kg)    Physical Exam Vitals:   11/27/20 0921  BP: (!) 156/90  Pulse: 87  Temp: (!) 97.3 F (36.3 C)  TempSrc: Temporal  SpO2: 100%  Weight: 180 lb (81.6 kg)  Height: 5\' 2"  (1.575  m)   General: Vital signs reviewed.  Patient is well-developed and well-nourished, in no acute distress and cooperative with exam.  Head: Normocephalic and atraumatic. Eyes: EOMI, conjunctivae normal, no scleral icterus.  Neck: Supple, trachea midline, normal ROM, no JVD, masses, thyromegaly, or carotid bruit present.  Cardiovascular: RRR, S1 normal, S2 normal, no murmurs, gallops, or rubs. Pulmonary/Chest: Clear to auscultation bilaterally, no wheezes, rales, or rhonchi. Abdominal: Soft, non-tender, non-distended, BS +, no masses, organomegaly, or guarding present.  Musculoskeletal: No joint deformities,  erythema, or stiffness, ROM full and nontender. Extremities: No lower extremity edema bilaterally,  pulses symmetric and intact bilaterally. No cyanosis or clubbing. Neurological: A&O x3, Strength is normal and symmetric bilaterally, Skin: Warm, dry and intact. No rashes or erythema. Psychiatric: Normal mood and affect. speech and behavior is normal. Cognition and memory are normal.  Lab Results  Component Value Date   HGBA1C 7.4 (A) 10/16/2020   HGBA1C 10.1 (A) 07/16/2020   HGBA1C 11.4 (A) 04/15/2020    Lab Results  Component Value Date   WBC 8.0 10/16/2020   HGB 11.0 (L) 10/16/2020   HCT 32.8 (L) 10/16/2020   PLT 380 10/16/2020   GLUCOSE 92 10/16/2020   CHOL 160 10/16/2020   TRIG 206 (H) 10/16/2020   HDL 32 (L) 10/16/2020   LDLCALC 93 10/16/2020   ALT 28 10/16/2020   AST 24 10/16/2020   NA 140 10/16/2020   K 6.0 (H) 10/16/2020   CL 106 10/16/2020   CREATININE 1.05 (H) 10/16/2020   BUN 30 (H) 10/16/2020   CO2 22 10/16/2020   TSH 2.940 11/27/2020   INR 1.08 05/13/2015   HGBA1C 7.4 (A) 10/16/2020   MICROALBUR 0.50 06/04/2009     Assessment & Plan:   Mauri was seen today for diabetic teaching .  Diagnoses and all orders for this visit:  Uncontrolled type 2 diabetes mellitus with hyperglycemia (HCC) -     Glucose (CBG) -     metFORMIN (GLUCOPHAGE XR) 500 MG 24 hr tablet; Take 1 tablet (500 mg total) by mouth 2 (two) times daily after a meal. -     Dulaglutide (TRULICITY) 1.5 MG/0.5ML SOPN; Inject 1.5 mg into the skin once a week. -     Insulin Glargine (BASAGLAR KWIKPEN) 100 UNIT/ML; Inject 60 Units into the skin daily. -     gabapentin (NEURONTIN) 400 MG capsule; Take 1 capsule (400 mg total) by mouth 4 (four) times daily.  Hyperthyroidism -     TSH + free T4 -     methimazole (TAPAZOLE) 5 MG tablet; Take 1 tablet (5 mg total) by mouth every Monday, Wednesday, and Friday.  Medication refill -     metFORMIN (GLUCOPHAGE XR) 500 MG 24 hr tablet; Take 1 tablet (500  mg total) by mouth 2 (two) times daily after a meal. -     atorvastatin (LIPITOR) 40 MG tablet; Take 1 tablet (40 mg total) by mouth at bedtime. -     losartan (COZAAR) 100 MG tablet; Take 1 tablet (100 mg total) by mouth daily.  Type 2 diabetes mellitus without complication, with long-term current use of insulin (HCC) -     metFORMIN (GLUCOPHAGE XR) 500 MG 24 hr tablet; Take 1 tablet (500 mg total) by mouth 2 (two) times daily after a meal.  Elevated lipoprotein(a) -     atorvastatin (LIPITOR) 40 MG tablet; Take 1 tablet (40 mg total) by mouth at bedtime.  Hypertension, unspecified type -     losartan (COZAAR)  100 MG tablet; Take 1 tablet (100 mg total) by mouth daily.  Essential hypertension -     amLODipine (NORVASC) 10 MG tablet; Take 1 tablet (10 mg total) by mouth daily.  Other orders -     Discontinue: gabapentin (NEURONTIN) 400 MG capsule; Take 1 capsule (400 mg total) by mouth 3 (three) times daily after meals.   I have discontinued Larry I. Boyum's nitroGLYCERIN, hydrochlorothiazide, insulin aspart, gabapentin, and gabapentin. I have also changed her Trulicity. Additionally, I am having her start on gabapentin. Lastly, I am having her maintain her aspirin, polyethylene glycol, Pen Needles, oxyCODONE-acetaminophen, metFORMIN, atorvastatin, losartan, amLODipine, Basaglar KwikPen, and methimazole.  Meds ordered this encounter  Medications  . metFORMIN (GLUCOPHAGE XR) 500 MG 24 hr tablet    Sig: Take 1 tablet (500 mg total) by mouth 2 (two) times daily after a meal.    Dispense:  180 tablet    Refill:  2  . DISCONTD: gabapentin (NEURONTIN) 400 MG capsule    Sig: Take 1 capsule (400 mg total) by mouth 3 (three) times daily after meals.    Dispense:  270 capsule    Refill:  1  . atorvastatin (LIPITOR) 40 MG tablet    Sig: Take 1 tablet (40 mg total) by mouth at bedtime.    Dispense:  90 tablet    Refill:  3  . losartan (COZAAR) 100 MG tablet    Sig: Take 1 tablet  (100 mg total) by mouth daily.    Dispense:  90 tablet    Refill:  1  . amLODipine (NORVASC) 10 MG tablet    Sig: Take 1 tablet (10 mg total) by mouth daily.    Dispense:  90 tablet    Refill:  3  . Dulaglutide (TRULICITY) 1.5 MG/0.5ML SOPN    Sig: Inject 1.5 mg into the skin once a week.    Dispense:  3.5 mL    Refill:  3    Has f/u appt 11/27/2020   On 11/26/2020 a 2 ml courtesy supply given.  . Insulin Glargine (BASAGLAR KWIKPEN) 100 UNIT/ML    Sig: Inject 60 Units into the skin daily.    Dispense:  15 mL    Refill:  2  . methimazole (TAPAZOLE) 5 MG tablet    Sig: Take 1 tablet (5 mg total) by mouth every Monday, Wednesday, and Friday.    Dispense:  90 tablet    Refill:  1  . gabapentin (NEURONTIN) 400 MG capsule    Sig: Take 1 capsule (400 mg total) by mouth 4 (four) times daily.    Dispense:  360 capsule    Refill:  1     Follow-up:   Return in about 1 month (around 12/28/2020) for DM in person.  The above assessment and management plan was discussed with the patient. The patient verbalized understanding of and has agreed to the management plan. Patient is aware to call the clinic if symptoms fail to improve or worsen. Patient is aware when to return to the clinic for a follow-up visit. Patient educated on when it is appropriate to go to the emergency department.   Gwinda Passe, NP-C

## 2020-11-28 LAB — TSH+FREE T4
Free T4: 1.23 ng/dL (ref 0.82–1.77)
TSH: 2.94 u[IU]/mL (ref 0.450–4.500)

## 2020-12-06 ENCOUNTER — Telehealth (INDEPENDENT_AMBULATORY_CARE_PROVIDER_SITE_OTHER): Payer: Self-pay

## 2020-12-06 NOTE — Telephone Encounter (Signed)
Call placed to patient with assistance of pacific interpreter [phone]) patient is aware to continue taking methimazole every M, W, F. She verbalized understanding. Maryjean Morn, CMA

## 2020-12-17 ENCOUNTER — Ambulatory Visit: Payer: Self-pay

## 2020-12-21 ENCOUNTER — Other Ambulatory Visit: Payer: Self-pay

## 2020-12-23 ENCOUNTER — Other Ambulatory Visit: Payer: Self-pay

## 2020-12-23 MED FILL — Dulaglutide Soln Auto-injector 1.5 MG/0.5ML: SUBCUTANEOUS | 28 days supply | Qty: 2 | Fill #0 | Status: AC

## 2020-12-23 MED FILL — Insulin Glargine Soln Pen-Injector 100 Unit/ML: SUBCUTANEOUS | 30 days supply | Qty: 18 | Fill #0 | Status: AC

## 2020-12-25 ENCOUNTER — Other Ambulatory Visit: Payer: Self-pay

## 2020-12-27 ENCOUNTER — Other Ambulatory Visit: Payer: Self-pay

## 2020-12-27 ENCOUNTER — Ambulatory Visit: Payer: Self-pay | Attending: Physician Assistant

## 2020-12-27 MED FILL — Losartan Potassium Tab 100 MG: ORAL | 30 days supply | Qty: 30 | Fill #0 | Status: AC

## 2020-12-27 MED FILL — Amlodipine Besylate Tab 10 MG (Base Equivalent): ORAL | 30 days supply | Qty: 30 | Fill #0 | Status: AC

## 2021-01-02 ENCOUNTER — Other Ambulatory Visit: Payer: Self-pay

## 2021-01-06 ENCOUNTER — Other Ambulatory Visit: Payer: Self-pay

## 2021-01-06 MED FILL — Methimazole Tab 5 MG: ORAL | 30 days supply | Qty: 30 | Fill #0 | Status: CN

## 2021-01-06 MED FILL — Atorvastatin Calcium Tab 40 MG (Base Equivalent): ORAL | 30 days supply | Qty: 30 | Fill #0 | Status: CN

## 2021-01-13 ENCOUNTER — Other Ambulatory Visit: Payer: Self-pay

## 2021-01-14 ENCOUNTER — Telehealth: Payer: Self-pay | Admitting: Primary Care

## 2021-01-14 ENCOUNTER — Other Ambulatory Visit: Payer: Self-pay

## 2021-01-14 ENCOUNTER — Ambulatory Visit (INDEPENDENT_AMBULATORY_CARE_PROVIDER_SITE_OTHER): Payer: Self-pay | Admitting: Primary Care

## 2021-01-14 MED FILL — Dulaglutide Soln Auto-injector 1.5 MG/0.5ML: SUBCUTANEOUS | 28 days supply | Qty: 2 | Fill #1 | Status: CN

## 2021-01-14 MED FILL — Gabapentin Cap 400 MG: ORAL | 30 days supply | Qty: 120 | Fill #0 | Status: CN

## 2021-01-14 NOTE — Telephone Encounter (Signed)
Pt was sent a letter from financial dept. Inform them, that the application they submitted was incomplete, since they were missing some documentation at the time of the appointment, Pt need to reschedule and resubmit all new papers and application for [employer] and [employer], P.S. old documents has been sent back by mail to the Pt and Pt. need to make a new appt. 

## 2021-01-15 ENCOUNTER — Other Ambulatory Visit: Payer: Self-pay

## 2021-01-17 ENCOUNTER — Other Ambulatory Visit: Payer: Self-pay

## 2021-01-17 ENCOUNTER — Encounter (INDEPENDENT_AMBULATORY_CARE_PROVIDER_SITE_OTHER): Payer: Self-pay | Admitting: Primary Care

## 2021-01-17 ENCOUNTER — Ambulatory Visit (INDEPENDENT_AMBULATORY_CARE_PROVIDER_SITE_OTHER): Payer: Self-pay | Admitting: Primary Care

## 2021-01-17 VITALS — BP 146/86 | HR 97 | Resp 16 | Wt 180.4 lb

## 2021-01-17 DIAGNOSIS — E0590 Thyrotoxicosis, unspecified without thyrotoxic crisis or storm: Secondary | ICD-10-CM

## 2021-01-17 DIAGNOSIS — I10 Essential (primary) hypertension: Secondary | ICD-10-CM

## 2021-01-17 DIAGNOSIS — E1165 Type 2 diabetes mellitus with hyperglycemia: Secondary | ICD-10-CM

## 2021-01-17 LAB — POCT GLYCOSYLATED HEMOGLOBIN (HGB A1C): HbA1c, POC (controlled diabetic range): 8.6 % — AB (ref 0.0–7.0)

## 2021-01-17 LAB — GLUCOSE, POCT (MANUAL RESULT ENTRY): POC Glucose: 94 mg/dl (ref 70–99)

## 2021-01-17 MED ORDER — BASAGLAR KWIKPEN 100 UNIT/ML ~~LOC~~ SOPN
PEN_INJECTOR | SUBCUTANEOUS | 2 refills | Status: DC
  Filled 2021-01-17: qty 15, 25d supply, fill #0

## 2021-01-17 NOTE — Progress Notes (Signed)
Subjective:  Patient ID: Rebecca [name], female    DOB: 1965/11/05  Age: 55 y.o. MRN: [id number]  CC: Diabetes Aram Beecham [id number]  HPI Rebecca [name] presents forFollow-up of diabetes. Patient does check blood sugar at home  Compliant with meds - Yes Checking CBGs? No  Fasting avg - 94- Exercising regularly? - Yes Watching carbohydrate intake? - Yes Neuropathy ? - Yes Hypoglycemic events - No  - Recovers with :   Pertinent ROS:  Polyuria - No Polydipsia - No Vision problems - No  Medications as noted below. Taking them regularly without complication/adverse reaction being reported today.   History Rebecca [name] has a past medical history of Anemia, Diabetes mellitus, Dyslipidemia, Hypertension, Non-STEMI (non-ST elevated myocardial infarction) (HCC), and Thyroid disease.   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   Her family history includes Cancer in her mother; Diabetes in her brother, brother, brother, brother, brother, sister, sister, sister, and sister; Hyperlipidemia in her father; Hypertension in her father.Rebecca [name] reports that Rebecca [name] has never smoked. Rebecca [name] has never used smokeless tobacco. Rebecca [name] reports that Rebecca [name] does not drink alcohol and does not use drugs.  Current Outpatient Medications on File Prior to Visit  Medication Sig Dispense Refill  . amLODipine (NORVASC) 10 MG tablet TAKE 1 TABLET (10 MG TOTAL) BY MOUTH DAILY. 90 tablet 3  . aspirin 81 MG EC tablet Take 1 tablet (81 mg total) by mouth daily. 90 tablet 1  . atorvastatin (LIPITOR) 40 MG tablet TAKE 1 TABLET (40 MG TOTAL) BY MOUTH AT BEDTIME. 90 tablet 3  . Dulaglutide 1.5 MG/0.5ML SOPN INJECT 1.5 MG INTO THE SKIN ONCE A WEEK. 1 mL 3  . gabapentin (NEURONTIN) 400 MG capsule TAKE 1 CAPSULE (400 MG TOTAL) BY MOUTH 4 (FOUR) TIMES DAILY. 360 capsule 1  . Insulin Pen Needle (PEN NEEDLES) 31G X 8 MM  MISC Inject 1 each into the skin at bedtime. 90 each 1  . losartan (COZAAR) 100 MG tablet TAKE 1 TABLET (100 MG TOTAL) BY MOUTH DAILY. 90 tablet 1  . metFORMIN (GLUCOPHAGE-XR) 500 MG 24 hr tablet TAKE 1 TABLET (500 MG TOTAL) BY MOUTH 2 (TWO) TIMES DAILY AFTER A MEAL. 180 tablet 2  . methimazole (TAPAZOLE) 5 MG tablet TAKE 1 TABLET (5 MG TOTAL) BY MOUTH EVERY MONDAY, WEDNESDAY, AND FRIDAY. 90 tablet 1  . oxyCODONE-acetaminophen (PERCOCET) 5-325 MG tablet Take 1 tablet by mouth every 4 (four) hours as needed. 30 tablet 0  . polyethylene glycol (MIRALAX / GLYCOLAX) 17 g packet Take 17 g by mouth daily. 14 each 3  . sodium polystyrene (KAYEXALATE) 15 GM/60ML suspension TAKE 60 MLS (15 G TOTAL) BY MOUTH ONCE FOR 1 DOSE. 60 mL 0  . [DISCONTINUED] sitaGLIPtin (JANUVIA) 100 MG tablet Take 1 tablet (100 mg total) by mouth daily. 90 tablet 1   No current facility-administered medications on file prior to visit.    ROS Review of Systems  All other systems reviewed and are negative.   Objective:  BP (!) 146/86   Pulse 97   Resp 16   Wt 180 lb 6.4 oz (81.8 kg)   LMP  (LMP Unknown)   SpO2 99%   BMI 33.00 kg/m   BP Readings from Last 3 Encounters:  01/17/21 (!) 146/86  11/27/20 (!) 156/90  10/16/20 (!) 144/89    Wt Readings from Last 3 Encounters:  01/17/21 180 lb 6.4 oz (81.8 kg)  11/27/20 180  lb (81.6 kg)  10/16/20 178 lb (80.7 kg)    Physical Exam Vitals reviewed.  Constitutional:      Appearance: Rebecca [name] is obese.  HENT:     Head: Normocephalic.     Right Ear: Tympanic membrane and external ear normal.     Left Ear: External ear normal.     Nose: Nose normal.  Eyes:     Extraocular Movements: Extraocular movements intact.     Pupils: Pupils are equal, round, and reactive to light.  Cardiovascular:     Rate and Rhythm: Normal rate and regular rhythm.  Pulmonary:     Effort: Pulmonary effort is normal.     Breath sounds: Normal breath sounds.  Abdominal:     General: Bowel  sounds are normal. There is distension.     Palpations: Abdomen is soft.  Musculoskeletal:        General: Normal range of motion.     Cervical back: Normal range of motion and neck supple.  Skin:    General: Skin is warm and dry.  Neurological:     Mental Status: Rebecca [name] is alert and oriented to person, place, and time.     Lab Results  Component Value Date   HGBA1C 8.6 (A) 01/17/2021   HGBA1C 7.4 (A) 10/16/2020   HGBA1C 10.1 (A) 07/16/2020    Lab Results  Component Value Date   WBC 8.0 10/16/2020   HGB 11.0 (L) 10/16/2020   HCT 32.8 (L) 10/16/2020   PLT 380 10/16/2020   GLUCOSE 92 10/16/2020   CHOL 160 10/16/2020   TRIG 206 (H) 10/16/2020   HDL 32 (L) 10/16/2020   LDLCALC 93 10/16/2020   ALT 28 10/16/2020   AST 24 10/16/2020   NA 140 10/16/2020   K 6.0 (H) 10/16/2020   CL 106 10/16/2020   CREATININE 1.05 (H) 10/16/2020   BUN 30 (H) 10/16/2020   CO2 22 10/16/2020   TSH 2.940 11/27/2020   INR 1.08 05/13/2015   HGBA1C 8.6 (A) 01/17/2021   MICROALBUR 0.50 06/04/2009     Assessment & Plan:   Rebecca [name] was seen today for diabetes.  Diagnoses and all orders for this visit:  Uncontrolled type 2 diabetes mellitus with hyperglycemia (HCC) -     POCT glucose (manual entry) -     POCT glycosylated hemoglobin (Hb A1C) 8.6 Rebecca [name] admits all her children and grandchildren birthdays and parties the first of the year and Rebecca [name] has been eating cakes, ice creams, sweets and mother's day is coming up.   Goal of therapy: Less than 6.5 hemoglobin A1c. Decrease foods that are high in carbohydrates are the following rice, potatoes, breads, sugars, and pastas.  Reduction in the intake (eating) will assist in lowering your blood sugars.  I have discontinued Rebecca [name] amoxicillin and ciprofloxacin. I am also having her maintain her aspirin, polyethylene glycol, Pen Needles, oxyCODONE-acetaminophen, gabapentin, methimazole, amLODipine, losartan, atorvastatin, metFORMIN,  Dulaglutide, sodium polystyrene, and Basaglar KwikPen.  Meds ordered this encounter  Medications  . Insulin Glargine (BASAGLAR KWIKPEN) 100 UNIT/ML    Sig: INJECT 60 UNITS INTO THE SKIN DAILY.    Dispense:  15 mL    Refill:  2     Follow-up:   Return in about 3 months (around 04/18/2021) for DM/fasting labs.  The above assessment and management plan was discussed with the patient. The patient verbalized understanding of and has agreed to the management plan. Patient is aware to call the clinic if symptoms fail to improve or  worsen. Patient is aware when to return to the clinic for a follow-up visit. Patient educated on when it is appropriate to go to the emergency department.   Gwinda Passe, NP-C

## 2021-01-17 NOTE — Patient Instructions (Signed)
Hipertensin en los adultos Hypertension, Adult El trmino hipertensin es otra forma de denominar a la presin arterial elevada. La presin arterial elevada fuerza al corazn a trabajar ms para bombear la sangre. Esto puede causar problemas con el paso del tiempo. Una lectura de presin arterial est compuesta por 2 nmeros. Hay un nmero superior (sistlico) sobre un nmero inferior (diastlico). Lo ideal es tener la presin arterial por debajo de 120/80. Las elecciones saludables pueden ayudar a bajar la presin arterial, o tal vez necesite medicamentos para bajarla. Cules son las causas? Se desconoce la causa de esta afeccin. Algunas afecciones pueden estar relacionadas con la presin arterial alta. Qu incrementa el riesgo?  Fumar.  Tener diabetes mellitus tipo 2, colesterol alto, o ambos.  No hacer la cantidad suficiente de actividad fsica o ejercicio.  Tener sobrepeso.  Consumir mucha grasa, azcar, caloras o sal (sodio) en su dieta.  Beber alcohol en exceso.  Tener una enfermedad renal a largo plazo (crnica).  Tener antecedentes familiares de presin arterial alta.  Edad. Los riesgos aumentan con la edad.  Raza. El riesgo es mayor para las personas afroamericanas.  Sexo. Antes de los 45aos, los hombres corren ms riesgo que las mujeres. Despus de los 65aos, las mujeres corren ms riesgo que los hombres.  Tener apnea obstructiva del sueo.  Estrs. Cules son los signos o los sntomas?  Es posible que la presin arterial alta puede no cause sntomas. La presin arterial muy alta (crisis hipertensiva) puede provocar: ? Dolor de cabeza. ? Sensaciones de preocupacin o nerviosismo (ansiedad). ? Falta de aire. ? Hemorragia nasal. ? Sensacin de malestar en el estmago (nuseas). ? Vmitos. ? Cambios en la forma de ver. ? Dolor muy intenso en el pecho. ? Convulsiones. Cmo se trata?  Esta afeccin se trata haciendo cambios saludables en el estilo de  vida, por ejemplo: ? Consumir alimentos saludables. ? Hacer ms ejercicio. ? Beber menos alcohol.  El mdico puede recetarle medicamentos si los cambios en el estilo de vida no son suficientes para lograr controlar la presin arterial y si: ? El nmero de arriba est por encima de 130. ? El nmero de abajo est por encima de 80.  Su presin arterial personal ideal puede variar. Siga estas instrucciones en su casa: Comida y bebida  Si se lo dicen, siga el plan de alimentacin de DASH (Dietary Approaches to Stop Hypertension, Maneras de alimentarse para detener la hipertensin). Para seguir este plan: ? Llene la mitad del plato de cada comida con frutas y verduras. ? Llene un cuarto del plato de cada comida con cereales integrales. Los cereales integrales incluyen pasta integral, arroz integral y pan integral. ? Coma y beba productos lcteos con bajo contenido de grasa, como leche descremada o yogur bajo en grasas. ? Llene un cuarto del plato de cada comida con protenas bajas en grasa (magras). Las protenas bajas en grasa incluyen pescado, pollo sin piel, huevos, frijoles y tofu. ? Evite consumir carne grasa, carne curada y procesada, o pollo con piel. ? Evite consumir alimentos prehechos o procesados.  Consuma menos de 1500 mg de sal por da.  No beba alcohol si: ? El mdico le indica que no lo haga. ? Est embarazada, puede estar embarazada o est tratando de quedar embarazada.  Si bebe alcohol: ? Limite la cantidad que bebe a lo siguiente:  De 0 a 1 medida por da para las mujeres.  De 0 a 2 medidas por da para los hombres. ? Est atento a la   cantidad de alcohol que hay en las bebidas que toma. En los [place], una medida equivale a una botella de cerveza de 12oz (355ml), un vaso de vino de 5oz (148ml) o un vaso de una bebida alcohlica de alta graduacin de 1oz (44ml).   Estilo de vida  Trabaje con su mdico para mantenerse en un peso saludable o para perder  peso. Pregntele a su mdico cul es el peso recomendable para usted.  Haga al menos 30minutos de ejercicio la mayora de los das de la semana. Estos pueden incluir caminar, nadar o andar en bicicleta.  Realice al menos 30 minutos de ejercicio que fortalezca sus msculos (ejercicios de resistencia) al menos 3 das a la semana. Estos pueden incluir levantar pesas o hacer Pilates.  No consuma ningn producto que contenga nicotina o tabaco, como cigarrillos, cigarrillos electrnicos y tabaco de mascar. Si necesita ayuda para dejar de fumar, consulte al mdico.  Controle su presin arterial en su casa tal como le indic el mdico.  Concurra a todas las visitas de seguimiento como se lo haya indicado el mdico. Esto es importante.   Medicamentos  Tome los medicamentos de venta libre y los recetados solamente como se lo haya indicado el mdico. Siga cuidadosamente las indicaciones.  No omita las dosis de medicamentos para la presin arterial. Los medicamentos pierden eficacia si omite dosis. El hecho de omitir las dosis tambin aumenta el riesgo de otros problemas.  Pregntele a su mdico a qu efectos secundarios o reacciones a los medicamentos debe prestar atencin. Comunquese con un mdico si:  Piensa que tiene una reaccin a los medicamentos que est tomando.  Tiene dolores de cabeza frecuentes (recurrentes).  Se siente mareado.  Tiene hinchazn en los tobillos.  Tiene problemas de visin. Solicite ayuda inmediatamente si:  Siente un dolor de cabeza muy intenso.  Empieza a sentirse desorientado (confundido).  Se siente dbil o adormecido.  Siente que va a desmayarse.  Tiene un dolor muy intenso en las siguientes zonas: ? Pecho. ? Vientre (abdomen).  Vomita ms de una vez.  Tiene dificultad para respirar. Resumen  El trmino hipertensin es otra forma de denominar a la presin arterial elevada.  La presin arterial elevada fuerza al corazn a trabajar ms para  bombear la sangre.  Para la mayora de las personas, una presin arterial normal es menor que 120/80.  Las decisiones saludables pueden ayudarle a disminuir su presin arterial. Si no puede bajar su presin arterial mediante decisiones saludables, es posible que deba tomar medicamentos. Esta informacin no tiene como fin reemplazar el consejo del mdico. Asegrese de hacerle al mdico cualquier pregunta que tenga. Document Revised: 06/23/2018 Document Reviewed: 06/23/2018 Elsevier Patient Education  2021 [employer]  

## 2021-01-22 ENCOUNTER — Other Ambulatory Visit: Payer: Self-pay

## 2021-01-23 ENCOUNTER — Other Ambulatory Visit: Payer: Self-pay

## 2021-01-23 MED ORDER — NOVOLOG FLEXPEN 100 UNIT/ML ~~LOC~~ SOPN
PEN_INJECTOR | SUBCUTANEOUS | 3 refills | Status: DC
  Filled 2021-01-23: qty 6, 30d supply, fill #0

## 2021-01-23 MED FILL — Atorvastatin Calcium Tab 40 MG (Base Equivalent): ORAL | 30 days supply | Qty: 30 | Fill #0 | Status: AC

## 2021-01-23 MED FILL — Metformin HCl Tab ER 24HR 500 MG: ORAL | 30 days supply | Qty: 60 | Fill #0 | Status: AC

## 2021-01-23 MED FILL — Methimazole Tab 5 MG: ORAL | Qty: 90 | Fill #0 | Status: CN

## 2021-01-23 MED FILL — Dulaglutide Soln Auto-injector 1.5 MG/0.5ML: SUBCUTANEOUS | 28 days supply | Qty: 2 | Fill #1 | Status: AC

## 2021-01-23 MED FILL — Atorvastatin Calcium Tab 40 MG (Base Equivalent): ORAL | 30 days supply | Qty: 30 | Fill #1 | Status: CN

## 2021-01-23 MED FILL — Gabapentin Cap 400 MG: ORAL | 30 days supply | Qty: 120 | Fill #0 | Status: AC

## 2021-01-24 ENCOUNTER — Other Ambulatory Visit: Payer: Self-pay

## 2021-01-24 MED FILL — Methimazole Tab 5 MG: ORAL | Qty: 90 | Fill #0 | Status: CN

## 2021-01-28 ENCOUNTER — Ambulatory Visit: Payer: Self-pay | Attending: Family Medicine

## 2021-01-28 ENCOUNTER — Other Ambulatory Visit: Payer: Self-pay

## 2021-01-30 ENCOUNTER — Other Ambulatory Visit: Payer: Self-pay

## 2021-01-31 ENCOUNTER — Other Ambulatory Visit: Payer: Self-pay

## 2021-01-31 ENCOUNTER — Telehealth (INDEPENDENT_AMBULATORY_CARE_PROVIDER_SITE_OTHER): Payer: Self-pay | Admitting: Primary Care

## 2021-01-31 MED FILL — Methimazole Tab 5 MG: ORAL | 28 days supply | Qty: 12 | Fill #0 | Status: AC

## 2021-01-31 NOTE — Telephone Encounter (Signed)
Please refill patient medication if appropriate.  Thank you

## 2021-01-31 NOTE — Telephone Encounter (Signed)
Patient requesting a refill of her medication but does not know the name. Patient states several request have been sent in by her pharmacy and her daughter but has not received a response. Advised patient to contact her pharmacy and have her pharmacy call directly. Patient states PCP is aware and would like a follow up call from her PCP or nurse and hung up.

## 2021-02-02 ENCOUNTER — Other Ambulatory Visit (INDEPENDENT_AMBULATORY_CARE_PROVIDER_SITE_OTHER): Payer: Self-pay | Admitting: Primary Care

## 2021-02-02 DIAGNOSIS — E7841 Elevated Lipoprotein(a): Secondary | ICD-10-CM

## 2021-02-02 DIAGNOSIS — E119 Type 2 diabetes mellitus without complications: Secondary | ICD-10-CM

## 2021-02-02 DIAGNOSIS — Z794 Long term (current) use of insulin: Secondary | ICD-10-CM

## 2021-02-02 DIAGNOSIS — I10 Essential (primary) hypertension: Secondary | ICD-10-CM

## 2021-02-02 DIAGNOSIS — E1165 Type 2 diabetes mellitus with hyperglycemia: Secondary | ICD-10-CM

## 2021-02-02 DIAGNOSIS — Z760 Encounter for issue of repeat prescription: Secondary | ICD-10-CM

## 2021-02-03 ENCOUNTER — Other Ambulatory Visit: Payer: Self-pay

## 2021-02-03 MED ORDER — LOSARTAN POTASSIUM 100 MG PO TABS
ORAL_TABLET | Freq: Every day | ORAL | 1 refills | Status: DC
  Filled 2021-02-03: qty 30, 30d supply, fill #0
  Filled 2021-04-14: qty 30, 30d supply, fill #1

## 2021-02-03 MED ORDER — INSULIN PEN NEEDLE 31G X 8 MM MISC
1.0000 | Freq: Every day | 1 refills | Status: DC
  Filled 2021-02-03: qty 100, 100d supply, fill #0

## 2021-02-03 MED ORDER — AMLODIPINE BESYLATE 10 MG PO TABS
ORAL_TABLET | Freq: Every day | ORAL | 3 refills | Status: DC
  Filled 2021-02-03: qty 30, 30d supply, fill #0
  Filled 2021-04-14: qty 30, 30d supply, fill #1

## 2021-02-03 MED ORDER — DULAGLUTIDE 1.5 MG/0.5ML ~~LOC~~ SOAJ
1.5000 mg | SUBCUTANEOUS | 3 refills | Status: DC
  Filled 2021-02-03: qty 2, 28d supply, fill #0
  Filled 2021-03-09: qty 2, 28d supply, fill #1

## 2021-02-03 MED ORDER — METFORMIN HCL ER 500 MG PO TB24
ORAL_TABLET | ORAL | 2 refills | Status: DC
  Filled 2021-02-03: qty 180, fill #0
  Filled 2021-03-05: qty 60, 30d supply, fill #0
  Filled 2021-04-14: qty 60, 30d supply, fill #1

## 2021-02-03 MED ORDER — ATORVASTATIN CALCIUM 40 MG PO TABS
ORAL_TABLET | Freq: Every day | ORAL | 3 refills | Status: DC
  Filled 2021-02-03: qty 90, fill #0
  Filled 2021-03-05: qty 30, 30d supply, fill #0
  Filled 2021-04-14: qty 30, 30d supply, fill #1

## 2021-02-03 MED ORDER — GABAPENTIN 400 MG PO CAPS
ORAL_CAPSULE | ORAL | 1 refills | Status: DC
  Filled 2021-02-03: qty 360, fill #0
  Filled 2021-03-05: qty 120, 30d supply, fill #0

## 2021-02-03 MED ORDER — BASAGLAR KWIKPEN 100 UNIT/ML ~~LOC~~ SOPN
PEN_INJECTOR | SUBCUTANEOUS | 2 refills | Status: DC
  Filled 2021-02-03: qty 15, fill #0
  Filled 2021-02-24: qty 15, 25d supply, fill #0
  Filled 2021-03-25: qty 15, 25d supply, fill #1

## 2021-02-04 ENCOUNTER — Other Ambulatory Visit: Payer: Self-pay

## 2021-02-22 ENCOUNTER — Encounter: Payer: Self-pay | Admitting: Nurse Practitioner

## 2021-02-22 ENCOUNTER — Telehealth: Payer: Self-pay | Admitting: Nurse Practitioner

## 2021-02-22 DIAGNOSIS — B029 Zoster without complications: Secondary | ICD-10-CM

## 2021-02-22 MED ORDER — VALACYCLOVIR HCL 1 G PO TABS: 1000.0000 mg | ORAL_TABLET | Freq: Three times a day (TID) | ORAL | 0 refills | Status: DC

## 2021-02-22 NOTE — Patient Instructions (Signed)
Shingles  Shingles is an infection. It gives you a painful skin rash and blisters that have fluid in them. Shingles is caused by the same germ (virus) that causes chickenpox. Shingles only happens in people who:  Have had chickenpox.  Have been given a shot of medicine (vaccine) to protect against chickenpox. Shingles is rare in this group. The first symptoms of shingles may be itching, tingling, or pain in an area on your skin. A rash will show on your skin a few days or weeks later. The rash is likely to be on one side of your body. The rash usually has a shape like a belt or a band. Over time, the rash turns into fluid-filled blisters. The blisters will break open, change into scabs, and dry up. Medicines may:  Help with pain and itching.  Help you get better sooner.  Help to prevent long-term problems. Follow these instructions at home: Medicines  Take over-the-counter and prescription medicines only as told by your doctor.  Put on an anti-itch cream or numbing cream where you have a rash, blisters, or scabs. Do this as told by your doctor. Helping with itching and discomfort  Put cold, wet cloths (cold compresses) on the area of the rash or blisters as told by your doctor.  Cool baths can help you feel better. Try adding baking soda or dry oatmeal to the water to lessen itching. Do not bathe in hot water.   Blister and rash care  Keep your rash covered with a loose bandage (dressing).  Wear loose clothing that does not rub on your rash.  Keep your rash and blisters clean. To do this, wash the area with mild soap and cool water as told by your doctor.  Check your rash every day for signs of infection. Check for: ? More redness, swelling, or pain. ? Fluid or blood. ? Warmth. ? Pus or a bad smell.  Do not scratch your rash. Do not pick at your blisters. To help you to not scratch: ? Keep your fingernails clean and cut short. ? Wear gloves or mittens when you sleep, if  scratching is a problem. General instructions  Rest as told by your doctor.  Keep all follow-up visits as told by your doctor. This is important.  Wash your hands often with soap and water. If soap and water are not available, use hand sanitizer. Doing this lowers your chance of getting a skin infection caused by germs (bacteria).  Your infection can cause chickenpox in people who have never had chickenpox or never got a shot of chickenpox vaccine. If you have blisters that did not change into scabs yet, try not to touch other people or be around other people, especially: ? Babies. ? Pregnant women. ? Children who have areas of red, itchy, or rough skin (eczema). ? Very old people who have transplants. ? People who have a long-term (chronic) sickness, like cancer or AIDS. Contact a doctor if:  Your pain does not get better with medicine.  Your pain does not get better after the rash heals.  You have any signs of infection in the rash area. These signs include: ? More redness, swelling, or pain around the rash. ? Fluid or blood coming from the rash. ? The rash area feeling warm to the touch. ? Pus or a bad smell coming from the rash. Get help right away if:  The rash is on your face or nose.  You have pain in your face or pain   by your eye.  You lose feeling on one side of your face.  You have trouble seeing.  You have ear pain, or you have ringing in your ear.  You have a loss of taste.  Your condition gets worse. Summary  Shingles gives you a painful skin rash and blisters that have fluid in them.  Shingles is an infection. It is caused by the same germ (virus) that causes chickenpox.  Keep your rash covered with a loose bandage (dressing). Wear loose clothing that does not rub on your rash.  If you have blisters that did not change into scabs yet, try not to touch other people or be around people. This information is not intended to replace advice given to you by  your health care provider. Make sure you discuss any questions you have with your health care provider. Document Revised: 12/30/2018 Document Reviewed: 05/12/2017 Elsevier Patient Education  2021 Elsevier Inc.  

## 2021-02-22 NOTE — Progress Notes (Signed)
[name], brill are scheduled for a virtual visit with Rebecca [name]< FNP today.    Just as we do with appointments in the office, we must obtain your consent to participate.  Your consent will be active for this visit and any virtual visit you may have with one of our providers in the next 365 days.    If you have a MyChart account, I can also send a copy of this consent to you electronically.  All virtual visits are billed to your insurance company just like a traditional visit in the office.  As this is a virtual visit, video technology does not allow for your provider to perform a traditional examination.  This may limit your provider's ability to fully assess your condition.  If your provider identifies any concerns that need to be evaluated in person or the need to arrange testing such as labs, EKG, etc, we will make arrangements to do so.    Although advances in technology are sophisticated, we cannot ensure that it will always work on either your end or our end.  If the connection with a video visit is poor, we may have to switch to a telephone visit.  With either a video or telephone visit, we are not always able to ensure that we have a secure connection.   I need to obtain your verbal consent now.   Are you willing to proceed with your visit today?   Rebecca [name] has provided verbal consent on 02/22/2021 for a virtual visit (video or telephone).  Virtual Visit via Video  I connected with  Rebecca [name]  on 02/22/21 at 5:50 by video and verified that I am speaking with the correct person using two identifiers. Rebecca [name] is currently located at home and her daughter is currently with her during visit. The provider, Rebecca Daphine Deutscher, FNP is located at home at time of visit.  I discussed the limitations, risks, security and privacy concerns of performing an evaluation and management service by video  and the availability of in person appointments.  I also discussed with the patient that there may be a patient responsible charge related to this service. The patient expressed understanding and agreed to proceed.   Subjective:   HPI:   Patient is complaining of rash on left upper thigh. She thinks it is  A bug bite. Started out as 1 red blister and now there are 5 blisters in liner pattern. Says it hurts and itches. There is no drainage. She did not actually see or feel an insect bite her.  Review of Systems  Constitutional: Negative for diaphoresis and weight loss.  Eyes: Negative for blurred vision, double vision and pain.  Respiratory: Negative for shortness of breath.   Cardiovascular: Negative for chest pain, palpitations, orthopnea and leg swelling.  Gastrointestinal: Negative for abdominal pain.  Skin: Negative for rash.  Neurological: Negative for dizziness, sensory change, loss of consciousness, weakness and headaches.  Endo/Heme/Allergies: Negative for polydipsia. Does not bruise/bleed easily.  Psychiatric/Behavioral: Negative for memory loss. The patient does not have insomnia.   All other systems reviewed and are negative.    See pertinent positives and negatives per HPI.  Patient Active Problem List   Diagnosis Date Noted  . Osteomyelitis of second toe of right foot (HCC)   . Ulcer of right foot, limited to breakdown of skin (HCC) 02/21/2020  . Diarrhea   . Hyperthyroidism   . Insulin dependent diabetes mellitus   . Orthostasis   .  Vaginal discharge   . Infectious diarrhea 11/26/2018  . Hyperkalemia 11/26/2018  . Bacteremia 06/21/2018  . Sepsis secondary to UTI (HCC) 06/20/2018  . Right great toe amputee (HCC) 08/05/2017  . Mild renal insufficiency 07/19/2017  . AKI (acute kidney injury) (HCC) 03/04/2016  . Essential hypertension 05/14/2015  . CAD- with distal LAD disease, ? 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 05/14/2015  . NSTEMI (non-ST elevated myocardial infarction) (HCC)   . Syncope 05/13/2015  . Hyperglycemia  10/25/2014  . Abrasion of thigh, left 10/25/2014  . DM (diabetes mellitus), type 2, uncontrolled (HCC)   . DM hyperosmolar coma, type 2 (HCC) 05/22/2012  . Hyperosmolar syndrome 05/21/2012    Social History   Tobacco Use  . Smoking status: Never Smoker  . Smokeless tobacco: Never Used  Substance Use Topics  . Alcohol use: No    Current Outpatient Medications:  .  amLODipine (NORVASC) 10 MG tablet, TAKE 1 TABLET (10 MG TOTAL) BY MOUTH DAILY., Disp: 90 tablet, Rfl: 3 .  aspirin 81 MG EC tablet, Take 1 tablet (81 mg total) by mouth daily., Disp: 90 tablet, Rfl: 1 .  atorvastatin (LIPITOR) 40 MG tablet, TAKE 1 TABLET (40 MG TOTAL) BY MOUTH AT BEDTIME., Disp: 90 tablet, Rfl: 3 .  Dulaglutide 1.5 MG/0.5ML SOPN, INJECT 1.5 MG INTO THE SKIN ONCE A WEEK., Disp: 1 mL, Rfl: 3 .  gabapentin (NEURONTIN) 400 MG capsule, TAKE 1 CAPSULE (400 MG TOTAL) BY MOUTH 4 (FOUR) TIMES DAILY., Disp: 360 capsule, Rfl: 1 .  Insulin Glargine (BASAGLAR KWIKPEN) 100 UNIT/ML, INJECT 60 UNITS INTO THE SKIN DAILY., Disp: 15 mL, Rfl: 2 .  Insulin Pen Needle 31G X 8 MM MISC, Inject 1 each into the skin at bedtime., Disp: 90 each, Rfl: 1 .  losartan (COZAAR) 100 MG tablet, TAKE 1 TABLET (100 MG TOTAL) BY MOUTH DAILY., Disp: 90 tablet, Rfl: 1 .  metFORMIN (GLUCOPHAGE-XR) 500 MG 24 hr tablet, TAKE 1 TABLET (500 MG TOTAL) BY MOUTH 2 (TWO) TIMES DAILY AFTER A MEAL., Disp: 180 tablet, Rfl: 2 .  methimazole (TAPAZOLE) 5 MG tablet, TAKE 1 TABLET (5 MG TOTAL) BY MOUTH EVERY MONDAY, WEDNESDAY, AND FRIDAY., Disp: 90 tablet, Rfl: 1 .  oxyCODONE-acetaminophen (PERCOCET) 5-325 MG tablet, Take 1 tablet by mouth every 4 (four) hours as needed., Disp: 30 tablet, Rfl: 0 .  polyethylene glycol (MIRALAX / GLYCOLAX) 17 g packet, Take 17 g by mouth daily., Disp: 14 each, Rfl: 3 .  sodium polystyrene (KAYEXALATE) 15 GM/60ML suspension, TAKE 60 MLS (15 G TOTAL) BY MOUTH ONCE FOR 1 DOSE., Disp: 60 mL, Rfl: 0  No Known Allergies  Objective:    LMP  (LMP Unknown)   Patient is well-developed, well-nourished in no acute distress.  Resting comfortably  at home.  Head is normocephalic, atraumatic.  No labored breathing.  Speech is clear and coherent with logical content.  Patient is alert and oriented at baseline.  43cm erythematous vesicular lesions in linear pattern on left mid thigh.  Assessment and Plan:        Rebecca [name] in today with chief complaint of No chief complaint on file.   1. Herpes zoster without complication Do not pick or scratch at area Cool compresses Patient is already on neurontin to help with burning and pain Meds ordered this encounter  Medications  . valACYclovir (VALTREX) 1000 MG tablet    Sig: Take 1 tablet (1,000 mg total) by mouth 3 (three) times daily.    Dispense:  21 tablet  Refill:  0    Order Specific Question:   Supervising Provider    Answer:   Eber Hong [[id number]]       The above assessment and management plan was discussed with the patient. The patient verbalized understanding of and has agreed to the management plan. Patient is aware to call the clinic if symptoms persist or worsen. Patient is aware when to return to the clinic for a follow-up visit. Patient educated on when it is appropriate to go to the emergency department.   Rebecca Daphine Deutscher, FNP  .   Rebecca Daphine Deutscher, FNP 02/22/2021  Time spent with the patient: 12 minutes, of which >50% was spent in obtaining information about symptoms, reviewing previous labs, evaluations, and treatments, counseling about condition (please see the discussed topics above), and developing a plan to further investigate it; had a number of questions which I addressed.

## 2021-02-24 ENCOUNTER — Other Ambulatory Visit: Payer: Self-pay

## 2021-02-25 ENCOUNTER — Other Ambulatory Visit: Payer: Self-pay

## 2021-02-28 ENCOUNTER — Other Ambulatory Visit: Payer: Self-pay

## 2021-03-05 ENCOUNTER — Other Ambulatory Visit (INDEPENDENT_AMBULATORY_CARE_PROVIDER_SITE_OTHER): Payer: Self-pay | Admitting: Primary Care

## 2021-03-05 ENCOUNTER — Other Ambulatory Visit: Payer: Self-pay

## 2021-03-05 MED FILL — Methimazole Tab 5 MG: ORAL | 28 days supply | Qty: 12 | Fill #1 | Status: AC

## 2021-03-05 NOTE — Telephone Encounter (Signed)
   Notes to clinic: medication requested has been d/c Review for continued use    Requested Prescriptions  Pending Prescriptions Disp Refills   insulin aspart (NOVOLOG FLEXPEN) 100 UNIT/ML FlexPen 15 mL 3    Sig: Inject 10 units into the skin 2 times daily before lunch and supper      Endocrinology:  Diabetes - Insulins Failed - 03/05/2021  2:43 PM      Failed - HBA1C is between 0 and 7.9 and within 180 days    HbA1c, POC (controlled diabetic range)  Date Value Ref Range Status  01/17/2021 8.6 (A) 0.0 - 7.0 % Final          Passed - Valid encounter within last 6 months    Recent Outpatient Visits           1 month ago Uncontrolled type 2 diabetes mellitus with hyperglycemia (HCC)   [facility] Gwinda Passe P, NP   3 months ago Uncontrolled type 2 diabetes mellitus with hyperglycemia (HCC)   [facility] Gwinda Passe P, NP   4 months ago Uncontrolled type 2 diabetes mellitus with hyperglycemia (HCC)   [facility] Gwinda Passe P, NP   6 months ago Uncontrolled type 2 diabetes mellitus with hyperglycemia [facility])   [place] [facility] [place], Jeannett Senior L, RPH-CPP   6 months ago Type 2 diabetes mellitus without complication, with long-term current use of insulin [facility])    [facility] Lois Huxley, Cornelius Moras, RPH-CPP       Future Appointments             In 1 month Randa Evens, Kinnie Scales, NP [facility]

## 2021-03-07 ENCOUNTER — Other Ambulatory Visit: Payer: Self-pay

## 2021-03-07 MED ORDER — NOVOLOG FLEXPEN 100 UNIT/ML ~~LOC~~ SOPN
PEN_INJECTOR | SUBCUTANEOUS | 3 refills | Status: DC
  Filled 2021-03-07: qty 15, 75d supply, fill #0
  Filled 2021-07-31: qty 18, 90d supply, fill #1
  Filled 2021-12-16: qty 18, 90d supply, fill #0

## 2021-03-10 ENCOUNTER — Other Ambulatory Visit: Payer: Self-pay

## 2021-03-17 ENCOUNTER — Other Ambulatory Visit: Payer: Self-pay

## 2021-03-17 ENCOUNTER — Other Ambulatory Visit: Payer: Self-pay | Admitting: Primary Care

## 2021-03-17 DIAGNOSIS — Z1231 Encounter for screening mammogram for malignant neoplasm of breast: Secondary | ICD-10-CM

## 2021-03-25 ENCOUNTER — Other Ambulatory Visit: Payer: Self-pay

## 2021-03-26 ENCOUNTER — Other Ambulatory Visit: Payer: Self-pay

## 2021-04-09 ENCOUNTER — Other Ambulatory Visit (INDEPENDENT_AMBULATORY_CARE_PROVIDER_SITE_OTHER): Payer: Self-pay | Admitting: Primary Care

## 2021-04-09 ENCOUNTER — Other Ambulatory Visit: Payer: Self-pay

## 2021-04-09 DIAGNOSIS — E1165 Type 2 diabetes mellitus with hyperglycemia: Secondary | ICD-10-CM

## 2021-04-09 MED ORDER — TRULICITY 1.5 MG/0.5ML ~~LOC~~ SOAJ
1.5000 mg | SUBCUTANEOUS | 3 refills | Status: DC
  Filled 2021-04-09: qty 2, 28d supply, fill #0

## 2021-04-09 MED FILL — Methimazole Tab 5 MG: ORAL | 28 days supply | Qty: 12 | Fill #2 | Status: AC

## 2021-04-09 NOTE — Telephone Encounter (Signed)
Sent to PCP ?

## 2021-04-14 ENCOUNTER — Other Ambulatory Visit: Payer: Self-pay

## 2021-04-15 ENCOUNTER — Other Ambulatory Visit: Payer: Self-pay

## 2021-04-18 ENCOUNTER — Other Ambulatory Visit: Payer: Self-pay

## 2021-04-18 ENCOUNTER — Ambulatory Visit (INDEPENDENT_AMBULATORY_CARE_PROVIDER_SITE_OTHER): Payer: Self-pay | Admitting: Primary Care

## 2021-04-18 ENCOUNTER — Encounter (INDEPENDENT_AMBULATORY_CARE_PROVIDER_SITE_OTHER): Payer: Self-pay | Admitting: Primary Care

## 2021-04-18 VITALS — BP 153/91 | HR 100 | Temp 97.3°F | Wt 174.0 lb

## 2021-04-18 DIAGNOSIS — I10 Essential (primary) hypertension: Secondary | ICD-10-CM

## 2021-04-18 DIAGNOSIS — E1165 Type 2 diabetes mellitus with hyperglycemia: Secondary | ICD-10-CM

## 2021-04-18 DIAGNOSIS — Z23 Encounter for immunization: Secondary | ICD-10-CM

## 2021-04-18 DIAGNOSIS — Z6831 Body mass index (BMI) 31.0-31.9, adult: Secondary | ICD-10-CM

## 2021-04-18 DIAGNOSIS — E119 Type 2 diabetes mellitus without complications: Secondary | ICD-10-CM

## 2021-04-18 DIAGNOSIS — E6609 Other obesity due to excess calories: Secondary | ICD-10-CM

## 2021-04-18 DIAGNOSIS — E7841 Elevated Lipoprotein(a): Secondary | ICD-10-CM

## 2021-04-18 DIAGNOSIS — E0590 Thyrotoxicosis, unspecified without thyrotoxic crisis or storm: Secondary | ICD-10-CM

## 2021-04-18 DIAGNOSIS — Z794 Long term (current) use of insulin: Secondary | ICD-10-CM

## 2021-04-18 LAB — POCT GLYCOSYLATED HEMOGLOBIN (HGB A1C): HbA1c, POC (controlled diabetic range): 9.7 % — AB (ref 0.0–7.0)

## 2021-04-18 LAB — POCT CBG (FASTING - GLUCOSE)-MANUAL ENTRY: Glucose Fasting, POC: 193 mg/dL — AB (ref 70–99)

## 2021-04-18 MED ORDER — GABAPENTIN 400 MG PO CAPS
ORAL_CAPSULE | ORAL | 1 refills | Status: DC
Start: 2021-04-18 — End: 2021-12-16
  Filled 2021-04-18: qty 120, 30d supply, fill #0
  Filled 2021-06-10: qty 120, 30d supply, fill #1
  Filled 2021-07-31: qty 120, 30d supply, fill #2
  Filled 2021-09-08: qty 120, 30d supply, fill #3
  Filled 2021-10-09: qty 120, 30d supply, fill #0
  Filled 2021-11-13: qty 120, 30d supply, fill #1

## 2021-04-18 MED ORDER — AMLODIPINE BESYLATE 10 MG PO TABS
ORAL_TABLET | Freq: Every day | ORAL | 3 refills | Status: DC
  Filled 2021-04-18: qty 90, fill #0
  Filled 2021-06-10: qty 30, 30d supply, fill #0
  Filled 2021-09-08: qty 30, 30d supply, fill #1
  Filled 2021-10-09: qty 30, 30d supply, fill #0
  Filled 2021-11-13: qty 30, 30d supply, fill #1
  Filled 2021-12-16 (×2): qty 30, 30d supply, fill #2
  Filled 2022-01-19: qty 30, 30d supply, fill #3
  Filled 2022-02-20: qty 30, 30d supply, fill #4
  Filled 2022-04-01: qty 30, 30d supply, fill #5

## 2021-04-18 MED ORDER — BASAGLAR KWIKPEN 100 UNIT/ML ~~LOC~~ SOPN
PEN_INJECTOR | SUBCUTANEOUS | 2 refills | Status: DC
Start: 2021-04-18 — End: 2021-07-03
  Filled 2021-04-18: qty 15, 25d supply, fill #0
  Filled 2021-05-19: qty 15, 25d supply, fill #1
  Filled 2021-06-10: qty 15, 25d supply, fill #2

## 2021-04-18 MED ORDER — METFORMIN HCL ER 500 MG PO TB24
ORAL_TABLET | ORAL | 2 refills | Status: DC
Start: 2021-04-18 — End: 2022-05-13
  Filled 2021-04-18: qty 180, fill #0
  Filled 2021-06-10: qty 60, 30d supply, fill #0
  Filled 2021-07-31: qty 60, 30d supply, fill #1
  Filled 2021-09-08: qty 60, 30d supply, fill #2
  Filled 2022-02-24: qty 60, 30d supply, fill #0

## 2021-04-18 MED ORDER — TRULICITY 3 MG/0.5ML ~~LOC~~ SOAJ
3.0000 mg | SUBCUTANEOUS | 6 refills | Status: DC
  Filled 2021-04-18: qty 2, 28d supply, fill #0
  Filled 2021-06-10: qty 2, 28d supply, fill #1
  Filled 2021-07-11: qty 2, 28d supply, fill #2
  Filled 2021-08-11: qty 2, 28d supply, fill #3
  Filled 2021-09-08 – 2021-09-11 (×2): qty 2, 28d supply, fill #4
  Filled 2021-10-15: qty 2, 28d supply, fill #0

## 2021-04-18 MED ORDER — ATORVASTATIN CALCIUM 40 MG PO TABS
ORAL_TABLET | Freq: Every day | ORAL | 3 refills | Status: DC
Start: 2021-04-18 — End: 2022-05-13
  Filled 2021-04-18: qty 90, fill #0
  Filled 2021-05-20: qty 30, 30d supply, fill #0
  Filled 2021-06-23: qty 30, 30d supply, fill #1
  Filled 2021-07-31: qty 30, 30d supply, fill #2
  Filled 2021-09-08: qty 30, 30d supply, fill #3
  Filled 2021-10-09: qty 30, 30d supply, fill #0
  Filled 2021-11-13: qty 30, 30d supply, fill #1
  Filled 2021-12-16: qty 30, 30d supply, fill #2
  Filled 2022-01-19: qty 30, 30d supply, fill #3
  Filled 2022-02-20: qty 30, 30d supply, fill #4
  Filled 2022-04-01: qty 30, 30d supply, fill #5

## 2021-04-18 MED ORDER — LOSARTAN POTASSIUM 100 MG PO TABS
ORAL_TABLET | Freq: Every day | ORAL | 1 refills | Status: DC
Start: 2021-04-18 — End: 2022-01-19
  Filled 2021-04-18: qty 90, fill #0
  Filled 2021-06-10: qty 30, 30d supply, fill #0
  Filled 2021-07-31: qty 30, 30d supply, fill #1
  Filled 2021-09-08: qty 30, 30d supply, fill #2
  Filled 2021-10-09: qty 30, 30d supply, fill #0
  Filled 2021-11-13: qty 30, 30d supply, fill #1
  Filled 2021-12-16: qty 30, 30d supply, fill #2

## 2021-04-18 NOTE — Progress Notes (Signed)
Subjective:  Patient ID: Rebecca [name], female    DOB: 1965/10/09  Age: 55 y.o. MRN: [id number]  CC: Diabetes   HPI Rebecca [name] is a 55 year old Hispanic female Rebecca [name] [phone] interpretor )presents for follow-up of diabetes. Patient does not check blood sugar at home  Compliant with meds - Yes Checking CBGs? Yes  Fasting avg - 120-190 (doesn't go over 200)  Postprandial average -  Exercising regularly? - Yes Watching carbohydrate intake? - tries  Neuropathy ? - Yes Hypoglycemic events - No  - Recovers with :   Pertinent ROS:  Polyuria - No Polydipsia - No Vision problems - No  Medications as noted below. Taking them regularly without complication/adverse reaction being reported today.   History Takia has a past medical history of Anemia, Diabetes mellitus, Dyslipidemia, Hypertension, Non-STEMI (non-ST elevated myocardial infarction) (HCC), and Thyroid disease.   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   Her family history includes Cancer in her mother; Diabetes in her brother, brother, brother, brother, brother, sister, sister, sister, and sister; Hyperlipidemia in her father; Hypertension in her father.She reports that she has never smoked. She has never used smokeless tobacco. She reports that she does not drink alcohol and does not use drugs.  Current Outpatient Medications on File Prior to Visit  Medication Sig Dispense Refill   aspirin 81 MG EC tablet Take 1 tablet (81 mg total) by mouth daily. 90 tablet 1   insulin aspart (NOVOLOG FLEXPEN) 100 UNIT/ML FlexPen Inject 10 units into the skin 2 times daily before lunch and supper 15 mL 3   Insulin Pen Needle 31G X 8 MM MISC Inject 1 each into the skin at bedtime. 90 each 1   methimazole (TAPAZOLE) 5 MG tablet TAKE 1 TABLET (5 MG TOTAL) BY MOUTH EVERY MONDAY, WEDNESDAY, AND FRIDAY. 90  tablet 1   polyethylene glycol (MIRALAX / GLYCOLAX) 17 g packet Take 17 g by mouth daily. (Patient not taking: Reported on 04/18/2021) 14 each 3   sodium polystyrene (KAYEXALATE) 15 GM/60ML suspension TAKE 60 MLS (15 G TOTAL) BY MOUTH ONCE FOR 1 DOSE. (Patient not taking: Reported on 04/18/2021) 60 mL 0   valACYclovir (VALTREX) 1000 MG tablet Take 1 tablet (1,000 mg total) by mouth 3 (three) times daily. (Patient not taking: Reported on 04/18/2021) 21 tablet 0   No current facility-administered medications on file prior to visit.    ROS Review of Systems  All other systems reviewed and are negative.  Objective:  BP (!) 153/91   Pulse 100   Temp (!) 97.3 F (36.3 C)   Wt 174 lb (78.9 kg)   LMP  (LMP Unknown)   SpO2 97%   BMI 31.83 kg/m   BP Readings from Last 3 Encounters:  04/18/21 (!) 153/91  01/17/21 (!) 146/86  11/27/20 (!) 156/90    Wt Readings from Last 3 Encounters:  04/18/21 174 lb (78.9 kg)  01/17/21 180 lb 6.4 oz (81.8 kg)  11/27/20 180 lb (81.6 kg)    Physical Exam General: Vital signs reviewed.  Patient is well-developed and well-nourished, Obese female in no acute distress and cooperative with exam.  Head: Normocephalic and atraumatic. Eyes: EOMI, conjunctivae normal, no scleral icterus.  Neck: Supple, trachea midline, normal ROM, no JVD, masses, thyromegaly, or carotid bruit present.  Cardiovascular: RRR, S1 normal, S2 normal, no murmurs, gallops, or rubs. Pulmonary/Chest: Clear to auscultation bilaterally, no wheezes, rales,  or rhonchi. Abdominal: Soft, non-tender, non-distended, BS +, no masses, organomegaly, or guarding present.  Musculoskeletal: No joint deformities, erythema, or stiffness, ROM full and nontender. (Right great toe amputated and wears diabetic shoes) Extremities: No lower extremity edema bilaterally,  pulses symmetric and intact bilaterally. No cyanosis or clubbing. Neurological: A&O x3, Strength is normal and symmetric bilaterally, sensory  intact to light touch bilaterally.  Skin: Warm, dry and intact. No rashes or erythema. Psychiatric: Normal mood and affect. speech and behavior is normal. Cognition and memory are normal.    I Lab Results  Component Value Date   HGBA1C 9.7 (A) 04/18/2021   HGBA1C 8.6 (A) 01/17/2021   HGBA1C 7.4 (A) 10/16/2020    Lab Results  Component Value Date   WBC 9.0 04/18/2021   HGB 11.9 04/18/2021   HCT 36.8 04/18/2021   PLT 326 04/18/2021   GLUCOSE 200 (H) 04/18/2021   CHOL 184 04/18/2021   TRIG 153 (H) 04/18/2021   HDL 32 (L) 04/18/2021   LDLCALC 124 (H) 04/18/2021   ALT 26 04/18/2021   AST 22 04/18/2021   NA 140 04/18/2021   K 5.4 (H) 04/18/2021   CL 103 04/18/2021   CREATININE 1.26 (H) 04/18/2021   BUN 21 04/18/2021   CO2 24 04/18/2021   TSH 1.770 04/18/2021   INR 1.08 05/13/2015   HGBA1C 9.7 (A) 04/18/2021   MICROALBUR 0.50 06/04/2009     Assessment & Plan:   Angelik was seen today for diabetes.  Diagnoses and all orders for this visit: sidora was seen today for diabetes.  Diagnoses and all orders for this visit:  Uncontrolled type 2 diabetes mellitus with hyperglycemia (HCC) -     HgB A1c 9.7 increase since last  visit  -     Glucose (CBG), Fasting -     CBC with Differential Increased Trulicity 1.5 ml weekly -     gabapentin (NEURONTIN) 400 MG capsule; TAKE 1 CAPSULE (400 MG TOTAL) BY MOUTH 4 (FOUR) TIMES DAILY. -     Insulin Glargine (BASAGLAR KWIKPEN) 100 UNIT/ML; INJECT 60 UNITS INTO THE SKIN DAILY. -     metFORMIN (GLUCOPHAGE-XR) 500 MG 24 hr tablet; TAKE 1 TABLET (500 MG TOTAL) BY MOUTH 2 (TWO) TIMES DAILY AFTER A MEAL.  Class 1 obesity due to excess calories without serious comorbidity with body mass index (BMI) of 31.0 to 31.9 in adult Discussed diet and exercise for person with BMI >25. Instructed: You must burn more calories than you eat. Losing 5 percent of your body weight should be considered a success. In the longer term, losing more than 15 percent  of your body weight and staying at this weight is an extremely good result. However, keep in mind that even losing 5 percent of your body weight leads to important health benefits, so try not to get discouraged if you're not able to lose more than this. Will recheck weight in 3-6 months.   Hypertension, unspecified type Counseled on blood pressure goal of less than 130/80, low-sodium, DASH diet, medication compliance, 150 minutes of moderate intensity exercise per week. Discussed medication compliance, adverse effects.  -     CMP14+EGFR -     losartan (COZAAR) 100 MG tablet; TAKE 1 TABLET (100 MG TOTAL) BY MOUTH DAILY. -     amLODipine (NORVASC) 10 MG tablet; TAKE 1 TABLET (10 MG TOTAL) BY MOUTH DAILY.  Elevated lipoprotein(a)  Healthy lifestyle diet of fruits vegetables fish nuts whole grains and low saturated fat . Foods  high in cholesterol or liver, fatty meats,cheese, butter avocados, nuts and seeds, chocolate and fried foods. -     Lipid Panel -     atorvastatin (LIPITOR) 40 MG tablet; TAKE 1 TABLET (40 MG TOTAL) BY MOUTH AT BEDTIME.  -     Hyperthyroidism Hyperthyroidism (overactive thyroid) occurs when your thyroid gland produces too much of the hormone thyroxine. Hyperthyroidism can accelerate your body's metabolism, causing unintentional weight loss and a rapid or irregular heartbeat  -     TSH + free T4  Need for Streptococcus pneumoniae vaccination -     Pneumococcal conjugate vaccine 20-valent (Prevnar 20)  Need for shingles vaccine -     Pneumococcal conjugate vaccine 20-valent (Prevnar 20)  Other orders -     Dulaglutide (TRULICITY) 3 YY/5.0PT SOPN; Inject 3 mg as directed once a week. -     Varicella-zoster vaccine IM (Shingrix)   I have discontinued Monicka I. Pieratt's sitaGLIPtin, oxyCODONE-acetaminophen, and Trulicity. I am also having her start on Trulicity. Additionally, I am having her maintain her aspirin, polyethylene glycol, methimazole, sodium  polystyrene, Insulin Pen Needle, valACYclovir, NovoLOG FlexPen, gabapentin, Basaglar KwikPen, losartan, metFORMIN, atorvastatin, and amLODipine.  Meds ordered this encounter  Medications   Dulaglutide (TRULICITY) 3 WS/5.6CL SOPN    Sig: Inject 3 mg as directed once a week.    Dispense:  3 mL    Refill:  6   gabapentin (NEURONTIN) 400 MG capsule    Sig: TAKE 1 CAPSULE (400 MG TOTAL) BY MOUTH 4 (FOUR) TIMES DAILY.    Dispense:  360 capsule    Refill:  1   Insulin Glargine (BASAGLAR KWIKPEN) 100 UNIT/ML    Sig: INJECT 60 UNITS INTO THE SKIN DAILY.    Dispense:  15 mL    Refill:  2   losartan (COZAAR) 100 MG tablet    Sig: TAKE 1 TABLET (100 MG TOTAL) BY MOUTH DAILY.    Dispense:  90 tablet    Refill:  1   metFORMIN (GLUCOPHAGE-XR) 500 MG 24 hr tablet    Sig: TAKE 1 TABLET (500 MG TOTAL) BY MOUTH 2 (TWO) TIMES DAILY AFTER A MEAL.    Dispense:  180 tablet    Refill:  2   atorvastatin (LIPITOR) 40 MG tablet    Sig: TAKE 1 TABLET (40 MG TOTAL) BY MOUTH AT BEDTIME.    Dispense:  90 tablet    Refill:  3   amLODipine (NORVASC) 10 MG tablet    Sig: TAKE 1 TABLET (10 MG TOTAL) BY MOUTH DAILY.    Dispense:  90 tablet    Refill:  3     Follow-up:   Return in about 4 weeks (around 05/16/2021) for Lurena Joiner (PCP 3 months).  The above assessment and management plan was discussed with the patient. The patient verbalized understanding of and has agreed to the management plan. Patient is aware to call the clinic if symptoms fail to improve or worsen. Patient is aware when to return to the clinic for a follow-up visit. Patient educated on when it is appropriate to go to the emergency department.   Juluis Mire, NP-C

## 2021-04-18 NOTE — Progress Notes (Signed)
Follow up diabetes and medication

## 2021-04-22 LAB — CMP14+EGFR
ALT: 26 IU/L (ref 0–32)
AST: 22 IU/L (ref 0–40)
Albumin/Globulin Ratio: 1.6 (ref 1.2–2.2)
Albumin: 4.5 g/dL (ref 3.8–4.9)
Alkaline Phosphatase: 154 IU/L — ABNORMAL HIGH (ref 44–121)
BUN/Creatinine Ratio: 17 (ref 9–23)
BUN: 21 mg/dL (ref 6–24)
Bilirubin Total: 0.4 mg/dL (ref 0.0–1.2)
CO2: 24 mmol/L (ref 20–29)
Calcium: 9.9 mg/dL (ref 8.7–10.2)
Chloride: 103 mmol/L (ref 96–106)
Creatinine, Ser: 1.26 mg/dL — ABNORMAL HIGH (ref 0.57–1.00)
Globulin, Total: 2.8 g/dL (ref 1.5–4.5)
Glucose: 200 mg/dL — ABNORMAL HIGH (ref 65–99)
Potassium: 5.4 mmol/L — ABNORMAL HIGH (ref 3.5–5.2)
Sodium: 140 mmol/L (ref 134–144)
Total Protein: 7.3 g/dL (ref 6.0–8.5)
eGFR: 50 mL/min/{1.73_m2} — ABNORMAL LOW (ref >59)

## 2021-04-22 LAB — LIPID PANEL
Chol/HDL Ratio: 5.8 ratio — ABNORMAL HIGH (ref 0.0–4.4)
Cholesterol, Total: 184 mg/dL (ref 100–199)
HDL: 32 mg/dL — ABNORMAL LOW (ref >39)
LDL Chol Calc (NIH): 124 mg/dL — ABNORMAL HIGH (ref 0–99)
Triglycerides: 153 mg/dL — ABNORMAL HIGH (ref 0–149)
VLDL Cholesterol Cal: 28 mg/dL (ref 5–40)

## 2021-04-22 LAB — CBC WITH DIFFERENTIAL/PLATELET
Basophils Absolute: 0.1 10*3/uL (ref 0.0–0.2)
Basos: 1 %
EOS (ABSOLUTE): 0.3 10*3/uL (ref 0.0–0.4)
Eos: 3 %
Hematocrit: 36.8 % (ref 34.0–46.6)
Hemoglobin: 11.9 g/dL (ref 11.1–15.9)
Immature Grans (Abs): 0 10*3/uL (ref 0.0–0.1)
Immature Granulocytes: 0 %
Lymphocytes Absolute: 2.4 10*3/uL (ref 0.7–3.1)
Lymphs: 26 %
MCH: 27.1 pg (ref 26.6–33.0)
MCHC: 32.3 g/dL (ref 31.5–35.7)
MCV: 84 fL (ref 79–97)
Monocytes Absolute: 0.7 10*3/uL (ref 0.1–0.9)
Monocytes: 8 %
Neutrophils Absolute: 5.5 10*3/uL (ref 1.4–7.0)
Neutrophils: 62 %
Platelets: 326 10*3/uL (ref 150–450)
RBC: 4.39 x10E6/uL (ref 3.77–5.28)
RDW: 13.1 % (ref 11.7–15.4)
WBC: 9 10*3/uL (ref 3.4–10.8)

## 2021-04-22 LAB — TSH+FREE T4
Free T4: 1.26 ng/dL (ref 0.82–1.77)
TSH: 1.77 u[IU]/mL (ref 0.450–4.500)

## 2021-04-23 ENCOUNTER — Other Ambulatory Visit: Payer: Self-pay

## 2021-04-24 ENCOUNTER — Ambulatory Visit
Admission: RE | Admit: 2021-04-24 | Discharge: 2021-04-24 | Disposition: A | Discharge status: Home / Self Care | Payer: Self-pay | Source: Ambulatory Visit | Attending: Primary Care | Admitting: Primary Care

## 2021-04-24 ENCOUNTER — Other Ambulatory Visit: Payer: Self-pay

## 2021-04-24 DIAGNOSIS — Z1231 Encounter for screening mammogram for malignant neoplasm of breast: Secondary | ICD-10-CM

## 2021-04-27 MED ORDER — METHIMAZOLE 5 MG PO TABS
ORAL_TABLET | ORAL | 1 refills | Status: DC
  Filled 2021-04-27: qty 90, fill #0
  Filled 2021-05-20: qty 12, 28d supply, fill #0
  Filled 2021-06-13: qty 12, 28d supply, fill #1
  Filled 2021-07-11: qty 12, 28d supply, fill #2
  Filled 2021-08-19: qty 12, 28d supply, fill #3
  Filled 2021-09-15: qty 12, 28d supply, fill #4
  Filled 2021-10-15: qty 12, 28d supply, fill #0
  Filled 2021-11-13: qty 12, 28d supply, fill #1
  Filled 2021-12-22: qty 12, 28d supply, fill #2
  Filled 2022-01-19: qty 12, 28d supply, fill #3

## 2021-04-28 ENCOUNTER — Other Ambulatory Visit: Payer: Self-pay

## 2021-05-16 ENCOUNTER — Encounter (INDEPENDENT_AMBULATORY_CARE_PROVIDER_SITE_OTHER): Payer: Self-pay

## 2021-05-19 ENCOUNTER — Other Ambulatory Visit: Payer: Self-pay

## 2021-05-20 ENCOUNTER — Other Ambulatory Visit: Payer: Self-pay

## 2021-05-20 ENCOUNTER — Ambulatory Visit: Payer: Self-pay | Attending: Primary Care | Admitting: Pharmacist

## 2021-05-20 DIAGNOSIS — E1165 Type 2 diabetes mellitus with hyperglycemia: Secondary | ICD-10-CM

## 2021-05-20 LAB — GLUCOSE, POCT (MANUAL RESULT ENTRY): POC Glucose: 304 mg/dl — AB (ref 70–99)

## 2021-05-20 NOTE — Progress Notes (Signed)
    S:    PCP: Marcelino Duster   No chief complaint on file.  Patient arrives well and in good spirits today.  Presents for diabetes evaluation, education, and management. Patient was referred and last seen by Gwinda Passe on 04/18/2021. At that visit, Trulicity was increased to 4 mg weekly.   Insurance coverage/medication affordability: Self pay  Medication adherence: Denied. She reports compliance with insulin and Trulicity but not her oral medication.    Current diabetes medications include: Metformin 1 g BID  Basaglar 60 units daily  Novolog 10 units BID (only taking once daily) Trulicity 3 mg weekly   Current hypertension medications include: Amlodipine 10 mg daily  Losartan 100 mg once daily  Current hyperlipidemia medications include: Atorvastatin 40 mg daily  Patient denies hypoglycemic events.   Patient reported dietary habits: Eats 2 meals/day. Admits to dietary indiscretion. Tries to limit tortillas but admits to eating tacos and taquitos. Admits to loving beans and eating a lot of beans.   Patient-reported exercise habits: none reported, however, she has just started walking again. This morning was her first day.   O:  POCT: 304   Lab Results  Component Value Date   HGBA1C 9.7 (A) 04/18/2021   There were no vitals filed for this visit.  Lipid Panel     Component Value Date/Time   CHOL 184 04/18/2021 1017   TRIG 153 (H) 04/18/2021 1017   HDL 32 (L) 04/18/2021 1017   CHOLHDL 5.8 (H) 04/18/2021 1017   CHOLHDL 4.5 07/30/2015 0937   VLDL 26 07/30/2015 0937   LDLCALC 124 (H) 04/18/2021 1017   Clinical Atherosclerotic Cardiovascular Disease (ASCVD): Yes  The ASCVD Risk score Denman George DC Jr., et al., 2013) failed to calculate for the following reasons:   The patient has a prior MI or stroke diagnosis    A/P: Diabetes longstanding currently uncontrolled secondary to medication non-compliance, sedentary lifestyle, and dietary indiscretion. Patient is able to  verbalize appropriate hypoglycemia management plan.  -No changes to medication regimen given non-compliance. Emphasized need for improved adherence. Pt is amenable to this.  -Extensively discussed pathophysiology of diabetes, recommended lifestyle interventions, dietary effects on blood sugar control -Counseled on s/sx of and management of hypoglycemia -Next A1C anticipated 06/2021.  Written patient instructions provided.  Total time in face to face counseling 30 minutes.   Follow up Pharmacist Clinic Visit in 4 weeks.     Butch Penny, PharmD, Patsy Baltimore, CPP Clinical Pharmacist [facility] [phone]

## 2021-06-10 ENCOUNTER — Other Ambulatory Visit: Payer: Self-pay

## 2021-06-13 ENCOUNTER — Other Ambulatory Visit: Payer: Self-pay

## 2021-06-17 ENCOUNTER — Ambulatory Visit (INDEPENDENT_AMBULATORY_CARE_PROVIDER_SITE_OTHER): Payer: Self-pay | Admitting: *Deleted

## 2021-06-17 ENCOUNTER — Ambulatory Visit (INDEPENDENT_AMBULATORY_CARE_PROVIDER_SITE_OTHER): Payer: Self-pay

## 2021-06-17 NOTE — Telephone Encounter (Signed)
Daughter provided Spanish interpreting.Pt with daughter. Pt having blood in urine that started Sunday. Color is light pink today. Pt c/o pelvic pressure. Denies fever or flank pain. Denies burning with voiding. No increased frequency or urgency. Denies lightheadedness. Daughter stated pt looks tired. Skin is dry. Advised daughter to take pt to UC if blood worsens or if pt develops fever or worsens. No available appts with PCP tomorrow. Pt needs to be seen within 24 hours. Please call daughter Wilhemina Cash) at [phone]. Do not call pt daughter stated her mom will not answer phone.     Reason for Disposition  Blood in urine  (Exception: could be normal menstrual bleeding)  Answer Assessment - Initial Assessment Questions 1. COLOR of URINE: "Describe the color of the urine."  (e.g., tea-colored, pink, red, blood clots, bloody)     Light pink 2. ONSET: "When did the bleeding start?"      Sunday 3. EPISODES: "How many times has there been blood in the urine?" or "How many times today?"     Every void- 4. PAIN with URINATION: "Is there any pain with passing your urine?" If Yes, ask: "How bad is the pain?"  (Scale 1-10; or mild, moderate, severe)    - MILD - complains slightly about urination hurting    - MODERATE - interferes with normal activities      - SEVERE - excruciating, unwilling or unable to urinate because of the pain      none 5. FEVER: "Do you have a fever?" If Yes, ask: "What is your temperature, how was it measured, and when did it start?"     no 6. ASSOCIATED SYMPTOMS: "Are you passing urine more frequently than usual?"no      7. OTHER SYMPTOMS: "Do you have any other symptoms?" (e.g., back/flank pain, abdominal pain, vomiting)     Lower abdominal to pelvic feels full and feels pressure 8. PREGNANCY: "Is there any chance you are pregnant?" "When was your last menstrual period?"     N/a  Protocols used: Urine - Blood In-A-AH

## 2021-06-17 NOTE — Telephone Encounter (Signed)
Pts daughter, Lana Fish, calling on behalf of pt. She states that the pt has had menopause already, but noticed bleeding on Saturday. She states that the pt has not experienced bleeding since until today and states that it looks like a full period. Only other symptom is a headache. Please advise .   Called patient via interpreter ID [phone] and no answer and unable to leave message. Called patient daughter Wilhemina Cash , [place]. Daughter did not require interpreter for review of symptoms. NT and patient's daughter Wilhemina Cash review patient c/o urinating blood starting on Sunday afternoon. Wore a panty liner and no blood noted on panty liner. Patient noting blood when wiping after urinating  now. C/o low abdominal pain , low back pain. C/o headache , like a migraine but eased off now. Denies fever, burning during urination and no pain during urination. Patient was triaged earlier today . Daughter reports last time patient had these symptoms patient had a "kidney infection" in 2019. No available appt noted. Encouraged patient's daughter if symptoms worsen or fever noted go to [facility] or ED. Care advise given. Patient verbalized understanding of care advise and to call back or go to [facility] or ED if symptoms worsen. Please advise  Reason for Disposition  Blood in urine  (Exception: could be normal menstrual bleeding)  Answer Assessment - Initial Assessment Questions 1. COLOR of URINE: "Describe the color of the urine."  (e.g., tea-colored, pink, red, blood clots, bloody)     Bloody  2. ONSET: "When did the bleeding start?"      Sunday  3. EPISODES: "How many times has there been blood in the urine?" or "How many times today?"     Now only blood when wiping  4. PAIN with URINATION: "Is there any pain with passing your urine?" If Yes, ask: "How bad is the pain?"  (Scale 1-10; or mild, moderate, severe)    - MILD - complains slightly about urination hurting    - MODERATE - interferes with normal activities      -  SEVERE - excruciating, unwilling or unable to urinate because of the pain      No pain passing urine . Pain in low abd. And low back  5. FEVER: "Do you have a fever?" If Yes, ask: "What is your temperature, how was it measured, and when did it start?"     no 6. ASSOCIATED SYMPTOMS: "Are you passing urine more frequently than usual?"     na 7. OTHER SYMPTOMS: "Do you have any other symptoms?" (e.g., back/flank pain, abdominal pain, vomiting)     Low abdominal pain and low back pain  8. PREGNANCY: "Is there any chance you are pregnant?" "When was your last menstrual period?"     Post menopausal  Protocols used: Urine - Blood In-A-AH

## 2021-06-20 ENCOUNTER — Ambulatory Visit: Payer: No Typology Code available for payment source | Admitting: Pharmacist

## 2021-06-24 ENCOUNTER — Other Ambulatory Visit: Payer: Self-pay

## 2021-06-30 ENCOUNTER — Other Ambulatory Visit: Payer: Self-pay

## 2021-07-03 ENCOUNTER — Other Ambulatory Visit: Payer: Self-pay

## 2021-07-03 ENCOUNTER — Ambulatory Visit: Payer: Self-pay | Attending: Family Medicine | Admitting: Pharmacist

## 2021-07-03 DIAGNOSIS — E1165 Type 2 diabetes mellitus with hyperglycemia: Secondary | ICD-10-CM

## 2021-07-03 LAB — GLUCOSE, POCT (MANUAL RESULT ENTRY): POC Glucose: 176 mg/dl — AB (ref 70–99)

## 2021-07-03 MED ORDER — BASAGLAR KWIKPEN 100 UNIT/ML ~~LOC~~ SOPN
65.0000 [IU] | PEN_INJECTOR | Freq: Every day | SUBCUTANEOUS | 2 refills | Status: DC
  Filled 2021-07-03 – 2021-07-11 (×2): qty 18, 27d supply, fill #0
  Filled 2021-08-11: qty 18, 27d supply, fill #1
  Filled 2021-09-08: qty 18, 27d supply, fill #2

## 2021-07-03 NOTE — Progress Notes (Signed)
    S:    PCP: Marcelino Duster   No chief complaint on file.  Patient arrives well and in good spirits today.  Presents for diabetes evaluation, education, and management. Patient was referred and last seen by Gwinda Passe on 04/18/2021. We saw her on 05/20/2021 and made no changes.    Insurance coverage/medication affordability: Self pay  Medication adherence: reported.  Current diabetes medications include: Metformin XR 500 mg BID  Basaglar 60 units daily  Novolog 10 units BID (only taking once daily) Trulicity 3 mg weekly   Current hypertension medications include: Amlodipine 10 mg daily  Losartan 100 mg once daily  Current hyperlipidemia medications include: Atorvastatin 40 mg daily  Patient denies hypoglycemic events.   Patient reported dietary habits: Eats 2 meals/day. Admits to dietary indiscretion. Tries to limit tortillas but admits to eating tacos and taquitos. Admits to loving beans and eating a lot of beans.   Patient-reported exercise habits: now walks daily ~20 minutes.   O:  POCT: 176   Lab Results  Component Value Date   HGBA1C 9.7 (A) 04/18/2021   There were no vitals filed for this visit.  Lipid Panel     Component Value Date/Time   CHOL 184 04/18/2021 1017   TRIG 153 (H) 04/18/2021 1017   HDL 32 (L) 04/18/2021 1017   CHOLHDL 5.8 (H) 04/18/2021 1017   CHOLHDL 4.5 07/30/2015 0937   VLDL 26 07/30/2015 0937   LDLCALC 124 (H) 04/18/2021 1017   Clinical Atherosclerotic Cardiovascular Disease (ASCVD): Yes  The ASCVD Risk score (Arnett DK, et al., 2019) failed to calculate for the following reasons:   The patient has a prior MI or stroke diagnosis    A/P: Diabetes longstanding currently uncontrolled. CBG today reveals improvement, however, home CBGs are above goal. Patient is able to verbalize appropriate hypoglycemia management plan. Will titrate insulin today.  -Increase Lantus to 65 units in the morning.  -Continue Novolog 10 units before dinner.   -Continue Trulicity 3 mg weekly.  -Continue metformin 500 mg XR BID.  -Extensively discussed pathophysiology of diabetes, recommended lifestyle interventions, dietary effects on blood sugar control -Counseled on s/sx of and management of hypoglycemia -Next A1C anticipated 06/2021.  Written patient instructions provided.  Total time in face to face counseling 30 minutes.   Follow up PCP Clinic Visit in 2 weeks.     Rebecca [name], PharmD, Rebecca [name], CPP Clinical Pharmacist [facility] [phone]

## 2021-07-10 ENCOUNTER — Other Ambulatory Visit: Payer: Self-pay

## 2021-07-11 ENCOUNTER — Other Ambulatory Visit: Payer: Self-pay

## 2021-07-17 ENCOUNTER — Other Ambulatory Visit: Payer: Self-pay

## 2021-07-21 ENCOUNTER — Ambulatory Visit (INDEPENDENT_AMBULATORY_CARE_PROVIDER_SITE_OTHER): Payer: Self-pay | Admitting: Primary Care

## 2021-07-31 ENCOUNTER — Other Ambulatory Visit: Payer: Self-pay

## 2021-08-06 ENCOUNTER — Other Ambulatory Visit: Payer: Self-pay

## 2021-08-07 ENCOUNTER — Other Ambulatory Visit: Payer: Self-pay

## 2021-08-11 ENCOUNTER — Ambulatory Visit (INDEPENDENT_AMBULATORY_CARE_PROVIDER_SITE_OTHER): Payer: Self-pay | Admitting: Primary Care

## 2021-08-11 ENCOUNTER — Encounter (INDEPENDENT_AMBULATORY_CARE_PROVIDER_SITE_OTHER): Payer: Self-pay | Admitting: Primary Care

## 2021-08-11 ENCOUNTER — Other Ambulatory Visit: Payer: Self-pay

## 2021-08-11 VITALS — BP 134/85 | HR 105 | Temp 97.2°F | Ht 60.0 in | Wt 179.2 lb

## 2021-08-11 DIAGNOSIS — E119 Type 2 diabetes mellitus without complications: Secondary | ICD-10-CM

## 2021-08-11 DIAGNOSIS — Z794 Long term (current) use of insulin: Secondary | ICD-10-CM

## 2021-08-11 DIAGNOSIS — Z23 Encounter for immunization: Secondary | ICD-10-CM

## 2021-08-11 DIAGNOSIS — E0590 Thyrotoxicosis, unspecified without thyrotoxic crisis or storm: Secondary | ICD-10-CM

## 2021-08-11 DIAGNOSIS — I10 Essential (primary) hypertension: Secondary | ICD-10-CM

## 2021-08-11 LAB — POCT GLYCOSYLATED HEMOGLOBIN (HGB A1C): Hemoglobin A1C: 9.3 % — AB (ref 4.0–5.6)

## 2021-08-11 NOTE — Progress Notes (Signed)
Subjective:  Patient ID: Rebecca [name], female    DOB: Feb 02, 1966  Age: 55 y.o. MRN: [id number]  CC: Diabetes   HPI [name] is a 55 year old Hispanic female Rebecca [name] [phone] interpretor )presents for follow-up of diabetes. Patient does check blood sugar at home.  Compliant with meds - Yes Checking CBGs? Yes  Fasting avg 120-217   Postprandial average -  Exercising regularly? - Yes watches her grand children  Watching carbohydrate intake? - tries  Neuropathy ? - Yes Hypoglycemic events - No  - Recovers with :   Pertinent ROS:  Polyuria - No Polydipsia - No Vision problems - No  Medications as noted below. Taking them regularly without complication/adverse reaction being reported today.   History Rebecca [name] has a past medical history of Anemia, Diabetes mellitus, Dyslipidemia, Hypertension, Non-STEMI (non-ST elevated myocardial infarction) ([place]), and Thyroid disease.   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   Her family history includes Cancer in her mother; Diabetes in her brother, brother, brother, brother, brother, sister, sister, sister, and sister; Hyperlipidemia in her father; Hypertension in her father.She reports that she has never smoked. She has never used smokeless tobacco. She reports that she does not drink alcohol and does not use drugs.  Current Outpatient Medications on File Prior to Visit  Medication Sig Dispense Refill  . amLODipine (NORVASC) 10 MG tablet TAKE 1 TABLET (10 MG TOTAL) BY MOUTH DAILY. 90 tablet 3  . aspirin 81 MG EC tablet Take 1 tablet (81 mg total) by mouth daily. 90 tablet 1  . atorvastatin (LIPITOR) 40 MG tablet TAKE 1 TABLET (40 MG TOTAL) BY MOUTH AT BEDTIME. 90 tablet 3  . Dulaglutide (TRULICITY) 3 JQ/4.9EE SOPN Inject 3 mg as directed once a week. 3 mL 6  . gabapentin (NEURONTIN) 400 MG capsule TAKE 1  CAPSULE (400 MG TOTAL) BY MOUTH 4 (FOUR) TIMES DAILY. 360 capsule 1  . insulin aspart (NOVOLOG FLEXPEN) 100 UNIT/ML FlexPen Inject 10 units into the skin 2 times daily before lunch and supper 15 mL 3  . Insulin Glargine (BASAGLAR KWIKPEN) 100 UNIT/ML Inject 65 Units into the skin daily. 18 mL 2  . Insulin Pen Needle 31G X 8 MM MISC Inject 1 each into the skin at bedtime. 90 each 1  . losartan (COZAAR) 100 MG tablet TAKE 1 TABLET (100 MG TOTAL) BY MOUTH DAILY. 90 tablet 1  . metFORMIN (GLUCOPHAGE-XR) 500 MG 24 hr tablet TAKE 1 TABLET (500 MG TOTAL) BY MOUTH 2 (TWO) TIMES DAILY AFTER A MEAL. 180 tablet 2  . methimazole (TAPAZOLE) 5 MG tablet TAKE 1 TABLET (5 MG TOTAL) BY MOUTH EVERY MONDAY, WEDNESDAY, AND FRIDAY. 90 tablet 1  . polyethylene glycol (MIRALAX / GLYCOLAX) 17 g packet Take 17 g by mouth daily. 14 each 3  . sodium polystyrene (KAYEXALATE) 15 GM/60ML suspension TAKE 60 MLS (15 G TOTAL) BY MOUTH ONCE FOR 1 DOSE. 60 mL 0  . valACYclovir (VALTREX) 1000 MG tablet Take 1 tablet (1,000 mg total) by mouth 3 (three) times daily. 21 tablet 0   No current facility-administered medications on file prior to visit.    ROS Review of Systems  Neurological:  Positive for headaches.       Every 3 days frontal area   Pertinent in HPI  Objective:  BP 134/85 (BP Location: Right Arm, Patient Position: Sitting, Cuff Size: Normal)   Pulse (!) 105  Temp (!) 97.2 F (36.2 C) (Temporal)   Ht 5' (1.524 m)   Wt 179 lb 3.2 oz (81.3 kg)   LMP  (LMP Unknown)   SpO2 98%   BMI 35.00 kg/m   BP Readings from Last 3 Encounters:  08/11/21 134/85  04/18/21 (!) 153/91  01/17/21 (!) 146/86    Wt Readings from Last 3 Encounters:  08/11/21 179 lb 3.2 oz (81.3 kg)  04/18/21 174 lb (78.9 kg)  01/17/21 180 lb 6.4 oz (81.8 kg)    Physical Exam General: Vital signs reviewed.  Patient is well-developed and well-nourished, Obese female in no acute distress and cooperative with exam.  Head: Normocephalic and  atraumatic. Eyes: EOMI, conjunctivae normal, no scleral icterus.  Neck: Supple, trachea midline, normal ROM, no JVD, masses, thyromegaly, or carotid bruit present.  Cardiovascular: RRR, S1 normal, S2 normal, no murmurs, gallops, or rubs. Pulmonary/Chest: Clear to auscultation bilaterally, no wheezes, rales, or rhonchi. Abdominal: Soft, non-tender, non-distended, BS +, no masses, organomegaly, or guarding present.  Musculoskeletal: No joint deformities, erythema, or stiffness, ROM full and nontender. (Right great toe amputated and wears diabetic sneakers ) Extremities: No lower extremity edema bilaterally,  pulses symmetric and intact bilaterally. No cyanosis or clubbing. Neurological: A&O x3, Strength is normal and symmetric bilaterally, sensory intact to light touch bilaterally.  Skin: Warm, dry and intact. No rashes or erythema. Psychiatric: Normal mood and affect. speech and behavior is normal. Cognition and memory are normal.    Lab Results  Component Value Date   HGBA1C 9.3 (A) 08/11/2021   HGBA1C 9.7 (A) 04/18/2021   HGBA1C 8.6 (A) 01/17/2021    Lab Results  Component Value Date   WBC 9.0 04/18/2021   HGB 11.9 04/18/2021   HCT 36.8 04/18/2021   PLT 326 04/18/2021   GLUCOSE 200 (H) 04/18/2021   CHOL 184 04/18/2021   TRIG 153 (H) 04/18/2021   HDL 32 (L) 04/18/2021   LDLCALC 124 (H) 04/18/2021   ALT 26 04/18/2021   AST 22 04/18/2021   NA 140 04/18/2021   K 5.4 (H) 04/18/2021   CL 103 04/18/2021   CREATININE 1.26 (H) 04/18/2021   BUN 21 04/18/2021   CO2 24 04/18/2021   TSH 1.770 04/18/2021   INR 1.08 05/13/2015   HGBA1C 9.3 (A) 08/11/2021   MICROALBUR 0.50 06/04/2009     Assessment & Plan:   Rebecca [name] was seen today for diabetes.  Diagnoses and all orders for this visit: sidora was seen today for diabetes.  Diagnoses and all orders for this visit:  Uncontrolled type 2 diabetes mellitus with hyperglycemia (HCC) -     HgB A1c  increase since last  visit  -      Glucose (CBG), Fasting -     CBC with Differential Increased Trulicity 3 ml weekly -     gabapentin (NEURONTIN) 400 MG capsule; TAKE 1 CAPSULE (400 MG TOTAL) BY MOUTH 4 (FOUR) TIMES DAILY. -     Insulin Glargine (BASAGLAR KWIKPEN) 100 UNIT/ML; INJECT 65 UNITS INTO THE SKIN DAILY. Increased by Clinical pharmacist 5 units  -     metFORMIN (GLUCOPHAGE-XR) 500 MG 24 hr tablet; TAKE 1 TABLET (500 MG TOTAL) BY MOUTH 2 (TWO) TIMES DAILY AFTER A MEAL. Increase novolog 14 units BID (2 larges meals)   Class 1 obesity due to excess calories without serious comorbidity with body mass index (BMI) of 31.0 to 31.9 in adult Discussed diet and exercise for person with BMI >25. Instructed: You must burn more  calories than you eat. Losing 5 percent of your body weight should be considered a success. In the longer term, losing more than 15 percent of your body weight and staying at this weight is an extremely good result. However, keep in mind that even losing 5 percent of your body weight leads to important health benefits, so try not to get discouraged if you're not able to lose more than this. Up 5 lbs since last visit   Hypertension, unspecified type Counseled on blood pressure goal of less than 130/80, low-sodium, DASH diet, medication compliance, 150 minutes of moderate intensity exercise per week. Discussed medication compliance, adverse effects.  -     CMP14+EGFR -     losartan (COZAAR) 100 MG tablet; TAKE 1 TABLET (100 MG TOTAL) BY MOUTH DAILY. -     amLODipine (NORVASC) 10 MG tablet; TAKE 1 TABLET (10 MG TOTAL) BY MOUTH DAILY.  Elevated lipoprotein(a)  Healthy lifestyle diet of fruits vegetables fish nuts whole grains and low saturated fat . Foods high in cholesterol or liver, fatty meats,cheese, butter avocados, nuts and seeds, chocolate and fried foods. -     Lipid Panel -     atorvastatin (LIPITOR) 40 MG tablet; TAKE 1 TABLET (40 MG TOTAL) BY MOUTH AT BEDTIME.  -     Hyperthyroidism Hyperthyroidism  (overactive thyroid) occurs when your thyroid gland produces too much of the hormone thyroxine. Hyperthyroidism can accelerate your body's metabolism, causing unintentional weight loss and a rapid or irregular heartbeat  -     TSH + free T4   I am having Rebecca [name] maintain her aspirin, polyethylene glycol, sodium polystyrene, Insulin Pen Needle, valACYclovir, NovoLOG FlexPen, Trulicity, gabapentin, losartan, metFORMIN, atorvastatin, amLODipine, methimazole, and Basaglar KwikPen.  No orders of the defined types were placed in this encounter.    Follow-up:   Return in about 3 months (around 11/11/2021) for fasting DM.  The above assessment and management plan was discussed with the patient. The patient verbalized understanding of and has agreed to the management plan. Patient is aware to call the clinic if symptoms fail to improve or worsen. Patient is aware when to return to the clinic for a follow-up visit. Patient educated on when it is appropriate to go to the emergency department.   Juluis Mire, NP-C

## 2021-08-12 ENCOUNTER — Other Ambulatory Visit: Payer: Self-pay

## 2021-08-12 LAB — TSH+FREE T4
Free T4: 1.11 ng/dL (ref 0.82–1.77)
TSH: 1.95 u[IU]/mL (ref 0.450–4.500)

## 2021-08-19 ENCOUNTER — Other Ambulatory Visit: Payer: Self-pay

## 2021-08-19 ENCOUNTER — Ambulatory Visit: Payer: Self-pay | Attending: Primary Care

## 2021-09-01 ENCOUNTER — Other Ambulatory Visit: Payer: Self-pay

## 2021-09-08 ENCOUNTER — Other Ambulatory Visit: Payer: Self-pay

## 2021-09-10 ENCOUNTER — Other Ambulatory Visit: Payer: Self-pay

## 2021-09-11 ENCOUNTER — Other Ambulatory Visit: Payer: Self-pay

## 2021-09-16 ENCOUNTER — Other Ambulatory Visit: Payer: Self-pay

## 2021-10-01 ENCOUNTER — Other Ambulatory Visit: Payer: Self-pay

## 2021-10-06 ENCOUNTER — Other Ambulatory Visit: Payer: Self-pay | Admitting: Family Medicine

## 2021-10-06 DIAGNOSIS — E1165 Type 2 diabetes mellitus with hyperglycemia: Secondary | ICD-10-CM

## 2021-10-06 MED ORDER — BASAGLAR KWIKPEN 100 UNIT/ML ~~LOC~~ SOPN
65.0000 [IU] | PEN_INJECTOR | Freq: Every day | SUBCUTANEOUS | 2 refills | Status: DC
  Filled 2021-10-06: qty 18, 27d supply, fill #0

## 2021-10-07 ENCOUNTER — Other Ambulatory Visit: Payer: Self-pay

## 2021-10-10 ENCOUNTER — Other Ambulatory Visit: Payer: Self-pay

## 2021-10-14 ENCOUNTER — Other Ambulatory Visit: Payer: Self-pay

## 2021-10-15 ENCOUNTER — Other Ambulatory Visit: Payer: Self-pay

## 2021-10-16 ENCOUNTER — Other Ambulatory Visit: Payer: Self-pay

## 2021-10-21 ENCOUNTER — Other Ambulatory Visit: Payer: Self-pay

## 2021-10-27 ENCOUNTER — Ambulatory Visit (INDEPENDENT_AMBULATORY_CARE_PROVIDER_SITE_OTHER): Payer: Self-pay | Admitting: *Deleted

## 2021-10-27 NOTE — Telephone Encounter (Signed)
Please advise 

## 2021-10-27 NOTE — Telephone Encounter (Signed)
°  Chief Complaint: tooth pain Symptoms: pain bottom left, facial swelling Frequency: started Friday Pertinent Negatives:  Disposition: [] ED /[x] Urgent Care (no appt availability in office) / [] Appointment(In office/virtual)/ []  [facility]/ [] Home Care/ [] Refused Recommended Disposition /[] [facility]/ []  Follow-up with PCP Additional Notes: Patient states she does have dentist- but they do not have appointment. Patient states the ED/UC will not help her and she will owe big bill. Patient advised I will send PCP message. Patient also advised of mobile unit tomorrow

## 2021-10-27 NOTE — Telephone Encounter (Signed)
Reason for Disposition  [1] SEVERE pain (e.g., excruciating, unable to do any normal activities) AND [2] not improved 2 hours after pain medicine  Answer Assessment - Initial Assessment Questions 1. LOCATION: "Which tooth is hurting?"  (e.g., right-side/left-side, upper/lower, front/back)     Bottom left 2. ONSET: "When did the toothache start?"  (e.g., hours, days)      Friday evening 3. SEVERITY: "How bad is the toothache?"  (Scale 1-10; mild, moderate or severe)   - MILD (1-3): doesn't interfere with chewing    - MODERATE (4-7): interferes with chewing, interferes with normal activities, awakens from sleep     - SEVERE (8-10): unable to eat, unable to do any normal activities, excruciating pain        severe 4. SWELLING: "Is there any visible swelling of your face?"     Has not noticed- does look more swollen than other side 5. OTHER SYMPTOMS: "Do you have any other symptoms?" (e.g., fever)     Sweating a little 6. PREGNANCY: "Is there any chance you are pregnant?" "When was your last menstrual period?"     *No Answer*  Protocols used: Toothache-A-AH

## 2021-10-28 ENCOUNTER — Other Ambulatory Visit (INDEPENDENT_AMBULATORY_CARE_PROVIDER_SITE_OTHER): Payer: Self-pay | Admitting: Primary Care

## 2021-10-28 ENCOUNTER — Ambulatory Visit: Payer: Self-pay | Admitting: Physician Assistant

## 2021-10-28 ENCOUNTER — Other Ambulatory Visit: Payer: Self-pay

## 2021-10-28 VITALS — BP 124/77 | HR 84 | Temp 98.2°F | Resp 18 | Ht 62.0 in | Wt 180.0 lb

## 2021-10-28 DIAGNOSIS — K047 Periapical abscess without sinus: Secondary | ICD-10-CM

## 2021-10-28 DIAGNOSIS — K029 Dental caries, unspecified: Secondary | ICD-10-CM

## 2021-10-28 MED ORDER — AMOXICILLIN 500 MG PO CAPS: 500.0000 mg | ORAL_CAPSULE | Freq: Three times a day (TID) | ORAL | 0 refills | Status: AC

## 2021-10-28 NOTE — Progress Notes (Signed)
Agree with assessment and plan ° °Myriam Brandhorst S. Mayers, PA-C °Physician Assistant °[facility] °[email or web address] ° °

## 2021-10-28 NOTE — Progress Notes (Signed)
Patient reports pain in the L side of mouth beginning last Friday afternoon. Patient reports swelling and pain to be present currently. Patient is scheduled to see the dentist on this coming Thursday.

## 2021-10-28 NOTE — Patient Instructions (Addendum)
You are going to take amoxicillin 3 times a day for the next 10 days.  Continue using ibuprofen as needed to help with your pain.  Make sure that you keep your appointment to follow-up with dentistry.  Roney Jaffe, PA-C Physician Assistant [facility] [email or web address]   Absceso dental Dental Abscess Un absceso dental es una infeccin alrededor de una pieza dental que puede manifestarse con dolor, hinchazn y acumulacin de pus, as como con otros sntomas. Es importante recibir tratamiento para [employer] sntomas y Automotive engineer que la infeccin se disemine. Los tipos generales de abscesos dentales son: Customer service manager. Este absceso puede formarse desde la parte interna de la pieza dental (pulpa). Absceso periodontal. Este absceso puede formarse desde la enca. Cules son las causas? Esta afeccin es causada por una infeccin bacteriana en el interior o alrededor de la pieza dental. Puede presentarse como consecuencia de lo siguiente: Caries dentales graves (caries). Traumatismo dental, como una pieza dental rota o astillada. Qu incrementa el riesgo? Es ms probable que esta afeccin se manifieste en los hombres. Adems, es ms probable que se manifieste en las personas que: Tienen caries. Tienen enfermedad grave de las encas. Comen refrigerios azucarados [employer]. Consumen productos con tabaco. Tienen diabetes. Tienen debilitado el sistema que combate las enfermedades (sistema inmunitario). No se cepillan ni cuidan sus dientes con regularidad. Cules son los signos o sntomas? Los sntomas leves de esta afeccin incluyen los siguientes: Dolor a Insurance claims handler. Mal aliento. Grant Ruts. Sabor amargo en la boca. Dolor en la pieza dental infectada o alrededor de esta. Los sntomas moderados de esta afeccin incluyen lo siguiente: Ganglios del cuello inflamados. Escalofros. Secrecin de pus. Hinchazn y enrojecimiento  alrededor de la pieza dental infectada, en la boca o en el rostro. Dolor intenso en la pieza dental infectada o alrededor de esta. Los sntomas graves de esta afeccin incluyen los siguientes: Dificultad para tragar. Dificultad para abrir Government social research officer. Nuseas. Vmitos. Cmo se diagnostica? Esta afeccin se diagnostica en funcin de lo siguiente: Sus sntomas, y antecedentes mdicos y odontolgicos. Un examen de la pieza dental infectada. Durante el examen, el dentista puede darle golpecitos suaves en la pieza dental infectada. Tambin puede tomarle radiografas del rea afectada. Cmo se trata? El tratamiento de la afeccin consiste en eliminar la infeccin. Esto se puede hacer con: Antibiticos. Pueden utilizarse en ciertas situaciones. Enjuague bucal antibacteriano. Incisin y drenaje. Este procedimiento se realiza mediante una incisin en el absceso para drenar el pus. La eliminacin del pus es la prioridad principal para tratar un absceso. Un tratamiento de conductos. Esto puede realizarse para salvar la pieza dental. El dentista accede a la parte visible de la pieza dental (corona) con un torno y Psychologist, counselling. Luego, llena el espacio y lo Water quality scientist. Extraccin dental. La pieza dental se extrae si no es posible salvarla con otro tratamiento. Tambin es posible que reciba tratamiento para el dolor, que puede consistir en: Paracetamol o antiinflamatorios no esteroideos (AINE). Geles que contienen un medicamento anestsico. Una inyeccin para bloquear el dolor cerca del nervio. Siga estas instrucciones en su casa: Medicamentos [employer] de venta libre y los recetados solamente como se lo haya indicado el dentista. Si le recetaron un antibitico, tmelo como se lo haya indicado el dentista. No deje de tomar el antibitico aunque comience a sentirse mejor. Si le recetaron un gel que contiene un medicamento anestsico, selo exactamente segn las indicaciones. No  use estos geles en nios menores de 2  aos de edad. Use un enjuague bucal antibacteriano solamente segn las indicaciones dentista. Instrucciones generales  Haga grgaras con una mezcla de agua y sal 3 o 4 veces al da, o segn sea necesario. Para preparar agua con sal, disuelva totalmente de  a 1 cucharadita (de 3 a 6 g) de sal en 1 taza (237 ml) de agua tibia. Consuma alimentos blandos mientras el absceso cicatriza. Beber suficiente lquido como para Theatre manager la orina de color amarillo plido. No se aplique calor en la parte externa de la boca. No consuma ningn producto que contenga nicotina o tabaco. Estos productos incluyen cigarrillos, tabaco para Higher education careers adviser y aparatos de vapeo, como los Psychologist, sport and exercise. Si necesita ayuda para dejar de fumar, consulte al dentista. Concurra a [place]. Esto es importante. Cmo se previene?  Cuidado dental excelente en casa, que incluye cepillarse los dientes todas las maanas y todas las noches con pasta dentfrica con fluoruro. Use hilo dental una vez al da. Hgase limpiezas dentales programadas regularmente. Considere la posibilidad de que le apliquen un sellador dental en cada pieza dental que tenga surcos profundos para prevenir la formacin de caries. Beba agua con fluoruro regularmente. Esta incluye el agua de grifo en la mayora de los [place]. Lea las etiquetas del agua embotellada para ver si contiene fluoruro. Reduzca o elimine las bebidas azucaradas. Ingiera comidas y refrigerios saludables. Use un protector bucal o un protector facial para proteger los dientes cuando practique deportes. Comunquese con un mdico si: El dolor es ms intenso y no se alivia con los [employer]. Se le hincha. Tiene pus alrededor de la pieza dental. Jaclynn Guarneri o escalofros. Solicite ayuda de inmediato si: Los sntomas empeoran repentinamente. Tiene un dolor de cabeza muy intenso. Tiene problemas para respirar o tragar. Tiene  dificultad para abrir Equities trader. Nota hinchazn en el cuello o alrededor del ojo. Estos sntomas pueden representar un problema grave que constituye Engineer, maintenance (IT). No espere a ver si los sntomas desaparecen. Solicite atencin mdica de inmediato. Comunquese con el servicio de emergencias de su localidad (911 en los [place]). No conduzca por sus propios medios [employer] hospital. Resumen Un absceso dental es una acumulacin de pus en una pieza dental o a su alrededor derivada de una infeccin. Un absceso dental puede formarse a causa de caries importantes, traumatismo en la pieza dental o enfermedad grave de las encas alrededor de una pieza dental. Algunos sntomas pueden ser dolor intenso, hinchazn, enrojecimiento y secrecin de pus en la pieza dental infectada y a su alrededor. La prioridad principal del tratamiento del absceso dental es drenar el pus. El tratamiento tambin puede implicar eliminar la parte daada en el interior de la pieza dental (tratamiento de conducto) o extraer la pieza dental. Esta informacin no tiene Marine scientist el consejo del mdico. Asegrese de hacerle al mdico cualquier pregunta que tenga. Document Revised: 12/10/2020 Document Reviewed: 12/10/2020 Elsevier Patient Education  [place].

## 2021-10-28 NOTE — Telephone Encounter (Signed)
Please let patient know that PCP has placed a dental referral. Unable to give any medication for pain.

## 2021-10-28 NOTE — Progress Notes (Signed)
Acute Office Visit  Subjective:    Patient ID: Rebecca [name], female    DOB: Apr 09, 1966, 56 y.o.   MRN: [id number]  Chief Complaint  Patient presents with   Dental Pain   Elmo Putt [id number], provided Spanish language interpretation services today.  HPI Patient is in today for tooth pain. It is in the bottom left of her mouth. This started on Friday evening, October 24, 2021. She describes the pain as severe. She has noticed some mild swelling. She is able to eat and has a normal appetite, but it is very painful to do so. She has tried Advil for the pain, taking 6-8 200 mg tablets all at once. She said this did not help her pain.  She placed a call with nurse triage yesterday, and her PCP, Juluis Mire, NP, sent a referral to dentistry. She has an appointment scheduled for Thursday. She is hoping for some help managing the pain until then.   Past Medical History:  Diagnosis Date   Anemia    Diabetes mellitus    Dyslipidemia    Hypertension    Non-STEMI (non-ST elevated myocardial infarction) [facility])    Medical therapy for distal LAD dissection   Thyroid disease     Past Surgical History:  Procedure Laterality Date   AMPUTATION Right 07/21/2017   Procedure: RIGHT GREAT TOE AMPUTATION;  Surgeon: Newt Minion, MD;  Location: [place];  Service: Orthopedics;  Laterality: Right;   AMPUTATION Right 07/05/2020   Procedure: RIGHT FOOT 2ND RAY AMPUTATION;  Surgeon: Newt Minion, MD;  Location: [place];  Service: Orthopedics;  Laterality: Right;   CARDIAC CATHETERIZATION N/A 05/14/2015   Procedure: Left Heart Cath and Coronary Angiography;  Surgeon: Leonie Man, MD; dLAD 60%>>95% (small vessel, ?dissection), CFX & RCA systems no sig dz, EF & LVEDP nl       Family History  Problem Relation Age of Onset   Cancer Mother    Hypertension Father    Hyperlipidemia Father    Diabetes Sister    Diabetes Sister    Diabetes Sister    Diabetes Sister    Diabetes Brother     Diabetes Brother    Diabetes Brother    Diabetes Brother    Diabetes Brother    Breast cancer Neg Hx     Social History   Socioeconomic History   Marital status: Married    Spouse name: Not on file   Number of children: Not on file   Years of education: Not on file   Highest education level: Not on file  Occupational History   Occupation: unemployed  Tobacco Use   Smoking status: Never   Smokeless tobacco: Never  Vaping Use   Vaping Use: Never used  Substance and Sexual Activity   Alcohol use: No   Drug use: No   Sexual activity: Yes  Other Topics Concern   Not on file  Social History Narrative   Not on file   Social Determinants of Health   Financial Resource Strain: Not on file  Food Insecurity: Not on file  Transportation Needs: Not on file  Physical Activity: Not on file  Stress: Not on file  Social Connections: Not on file  Intimate Partner Violence: Not on file    Outpatient Medications Prior to Visit  Medication Sig Dispense Refill   amLODipine (NORVASC) 10 MG tablet TAKE 1 TABLET (10 MG TOTAL) BY MOUTH DAILY. 90 tablet 3   aspirin 81 MG EC tablet  Take 1 tablet (81 mg total) by mouth daily. 90 tablet 1   atorvastatin (LIPITOR) 40 MG tablet TAKE 1 TABLET (40 MG TOTAL) BY MOUTH AT BEDTIME. 90 tablet 3   Dulaglutide (TRULICITY) 3 QH/4.7ML SOPN Inject 3 mg as directed once a week. 3 mL 6   gabapentin (NEURONTIN) 400 MG capsule TAKE 1 CAPSULE (400 MG TOTAL) BY MOUTH 4 (FOUR) TIMES DAILY. 360 capsule 1   insulin aspart (NOVOLOG FLEXPEN) 100 UNIT/ML FlexPen Inject 10 units into the skin 2 times daily before lunch and supper 15 mL 3   Insulin Glargine (BASAGLAR KWIKPEN) 100 UNIT/ML Inject 65 Units into the skin daily. 18 mL 2   Insulin Pen Needle 31G X 8 MM MISC Inject 1 each into the skin at bedtime. 90 each 1   losartan (COZAAR) 100 MG tablet TAKE 1 TABLET (100 MG TOTAL) BY MOUTH DAILY. 90 tablet 1   metFORMIN (GLUCOPHAGE-XR) 500 MG 24 hr tablet TAKE 1 TABLET  (500 MG TOTAL) BY MOUTH 2 (TWO) TIMES DAILY AFTER A MEAL. 180 tablet 2   methimazole (TAPAZOLE) 5 MG tablet TAKE 1 TABLET (5 MG TOTAL) BY MOUTH EVERY MONDAY, WEDNESDAY, AND FRIDAY. 90 tablet 1   polyethylene glycol (MIRALAX / GLYCOLAX) 17 g packet Take 17 g by mouth daily. 14 each 3   valACYclovir (VALTREX) 1000 MG tablet Take 1 tablet (1,000 mg total) by mouth 3 (three) times daily. 21 tablet 0   No facility-administered medications prior to visit.    No Known Allergies  Review of Systems  Constitutional:  Positive for fatigue. Negative for chills and fever.  HENT:  Positive for dental problem, ear pain and facial swelling.   Eyes: Negative.   Respiratory:  Negative for cough and shortness of breath.   Cardiovascular:  Negative for chest pain.  Gastrointestinal:  Negative for diarrhea, nausea and vomiting.  Endocrine: Negative.   Genitourinary:  Negative for dysuria.  Musculoskeletal:  Positive for arthralgias and myalgias.  Skin: Negative.   Neurological:  Positive for headaches (same side as dental pain).  Psychiatric/Behavioral: Negative.        Objective:    Physical Exam Constitutional:      Appearance: Normal appearance.  HENT:     Head: Normocephalic and atraumatic.     Jaw: Swelling present.     Right Ear: External ear normal.     Left Ear: External ear normal.     Mouth/Throat:     Mouth: Mucous membranes are moist.     Dentition: Abnormal dentition. Dental tenderness and dental caries present.     Pharynx: Oropharynx is clear.  Eyes:     General: No scleral icterus. Cardiovascular:     Rate and Rhythm: Normal rate and regular rhythm.     Pulses: Normal pulses.     Heart sounds: Normal heart sounds.  Pulmonary:     Effort: Pulmonary effort is normal.     Breath sounds: Normal breath sounds. No wheezing, rhonchi or rales.  Musculoskeletal:        General: Normal range of motion.  Skin:    General: Skin is warm and dry.  Neurological:     General: No focal  deficit present.     Mental Status: She is alert and oriented to person, place, and time.  Psychiatric:        Mood and Affect: Mood normal.        Behavior: Behavior normal.        Thought Content: Thought content  normal.        Judgment: Judgment normal.    BP 124/77 (BP Location: Left Arm, Patient Position: Sitting, Cuff Size: Normal)    Pulse 84    Temp 98.2 F (36.8 C) (Oral)    Resp 18    Ht 5' 2" (1.575 m)    Wt 180 lb (81.6 kg)    LMP  (LMP Unknown)    SpO2 98%    BMI 32.92 kg/m  Wt Readings from Last 3 Encounters:  10/28/21 180 lb (81.6 kg)  08/11/21 179 lb 3.2 oz (81.3 kg)  04/18/21 174 lb (78.9 kg)    Health Maintenance Due  Topic Date Due   OPHTHALMOLOGY EXAM  Never done   Fecal DNA (Cologuard)  Never done   COVID-19 Vaccine (3 - Pfizer risk series) 02/13/2020   FOOT EXAM  01/15/2021   Zoster Vaccines- Shingrix (2 of 2) 06/13/2021    There are no preventive care reminders to display for this patient.   Lab Results  Component Value Date   TSH 1.950 08/11/2021   Lab Results  Component Value Date   WBC 9.0 04/18/2021   HGB 11.9 04/18/2021   HCT 36.8 04/18/2021   MCV 84 04/18/2021   PLT 326 04/18/2021   Lab Results  Component Value Date   NA 140 04/18/2021   K 5.4 (H) 04/18/2021   CO2 24 04/18/2021   GLUCOSE 200 (H) 04/18/2021   BUN 21 04/18/2021   CREATININE 1.26 (H) 04/18/2021   BILITOT 0.4 04/18/2021   ALKPHOS 154 (H) 04/18/2021   AST 22 04/18/2021   ALT 26 04/18/2021   PROT 7.3 04/18/2021   ALBUMIN 4.5 04/18/2021   CALCIUM 9.9 04/18/2021   ANIONGAP 10 07/05/2020   EGFR 50 (L) 04/18/2021   Lab Results  Component Value Date   CHOL 184 04/18/2021   Lab Results  Component Value Date   HDL 32 (L) 04/18/2021   Lab Results  Component Value Date   LDLCALC 124 (H) 04/18/2021   Lab Results  Component Value Date   TRIG 153 (H) 04/18/2021   Lab Results  Component Value Date   CHOLHDL 5.8 (H) 04/18/2021   Lab Results  Component Value  Date   HGBA1C 9.3 (A) 08/11/2021       Assessment & Plan:   Problem List Items Addressed This Visit   None Visit Diagnoses     Dental abscess    -  Primary   Relevant Medications   amoxicillin (AMOXIL) 500 MG capsule      1. Dental abscess Upon inspection, the patient had mild facial swelling. I could not visualize any pus, but there was notable gum swelling. Dental caries were visualized on multiple teeth as well. Since the patient is going to be seen on Thursday by dentistry, who will be addressing the underlying cause of her pain, it seems reasonable at this point to start her on oral amoxicillin, 100 mg, three times daily for 10 days to help reduce the swelling from this infection. I also counseled the patient on appropriate NSAID usage, as having 6-8 ibuprofen (1200-1600 mg) at once will most likely cause her stomach upset without added therapeutic benefit. I advised no more than 493m ibuprofen every 4-6 hours. Patient verbalized understanding. Patient will keep dentistry appointment scheduled for Thursday.   - amoxicillin (AMOXIL) 500 MG capsule; Take 1 capsule (500 mg total) by mouth 3 (three) times daily for 10 days.  Dispense: 30 capsule; Refill: 0  Meds ordered this encounter  Medications   amoxicillin (AMOXIL) 500 MG capsule    Sig: Take 1 capsule (500 mg total) by mouth 3 (three) times daily for 10 days.    Dispense:  30 capsule    Refill:  0    Order Specific Question:   Supervising Provider    Answer:   Elsie Stain [[id number]]    I have reviewed the patient's medical history (PMH, PSH, Social History, Family History, Medications, and allergies) , and have been updated if relevant. I spent 30 minutes reviewing chart and  face to face time with patient.    Cari S Mayers, PA-C  Kennieth Rad, PA-C Physician Assistant [place] [email or web address]

## 2021-11-03 ENCOUNTER — Other Ambulatory Visit: Payer: Self-pay

## 2021-11-10 ENCOUNTER — Other Ambulatory Visit: Payer: Self-pay

## 2021-11-11 ENCOUNTER — Ambulatory Visit (INDEPENDENT_AMBULATORY_CARE_PROVIDER_SITE_OTHER): Payer: Self-pay | Admitting: Primary Care

## 2021-11-11 ENCOUNTER — Encounter (INDEPENDENT_AMBULATORY_CARE_PROVIDER_SITE_OTHER): Payer: Self-pay | Admitting: Primary Care

## 2021-11-11 ENCOUNTER — Other Ambulatory Visit: Payer: Self-pay

## 2021-11-11 VITALS — BP 148/88 | HR 95 | Temp 98.0°F | Ht 62.0 in | Wt 179.6 lb

## 2021-11-11 DIAGNOSIS — B351 Tinea unguium: Secondary | ICD-10-CM

## 2021-11-11 DIAGNOSIS — E119 Type 2 diabetes mellitus without complications: Secondary | ICD-10-CM

## 2021-11-11 DIAGNOSIS — E0590 Thyrotoxicosis, unspecified without thyrotoxic crisis or storm: Secondary | ICD-10-CM

## 2021-11-11 DIAGNOSIS — E875 Hyperkalemia: Secondary | ICD-10-CM

## 2021-11-11 DIAGNOSIS — I10 Essential (primary) hypertension: Secondary | ICD-10-CM

## 2021-11-11 DIAGNOSIS — Z23 Encounter for immunization: Secondary | ICD-10-CM

## 2021-11-11 DIAGNOSIS — Z794 Long term (current) use of insulin: Secondary | ICD-10-CM

## 2021-11-11 LAB — POCT GLYCOSYLATED HEMOGLOBIN (HGB A1C): Hemoglobin A1C: 8.2 % — AB (ref 4.0–5.6)

## 2021-11-11 NOTE — Progress Notes (Signed)
[name], is a 56 y.o. female  IHK:[id number]  VFI:[id number]  DOB - Mar 04, 1966  Chief Complaint  Patient presents with   Diabetes       Subjective:   Rebecca [name] is a 56 y.o. female ( interpreter [place] [phone]) here today for a follow up visit for the management of T2D and HTN.. Patient has No headache, No chest pain, No abdominal pain - No Nausea, No new weakness tingling or numbness, No Cough - SOB.  No problems updated. onychomycosis No Known Allergies  Past Medical History:  Diagnosis Date   Anemia    Diabetes mellitus    Dyslipidemia    Hypertension    Non-STEMI (non-ST elevated myocardial infarction) [facility])    Medical therapy for distal LAD dissection   Thyroid disease     Current Outpatient Medications on File Prior to Visit  Medication Sig Dispense Refill   amLODipine (NORVASC) 10 MG tablet TAKE 1 TABLET (10 MG TOTAL) BY MOUTH DAILY. 90 tablet 3   aspirin 81 MG EC tablet Take 1 tablet (81 mg total) by mouth daily. 90 tablet 1   atorvastatin (LIPITOR) 40 MG tablet TAKE 1 TABLET (40 MG TOTAL) BY MOUTH AT BEDTIME. 90 tablet 3   Dulaglutide (TRULICITY) 3 TK/1.6WF SOPN Inject 3 mg as directed once a week. 3 mL 6   gabapentin (NEURONTIN) 400 MG capsule TAKE 1 CAPSULE (400 MG TOTAL) BY MOUTH 4 (FOUR) TIMES DAILY. 360 capsule 1   insulin aspart (NOVOLOG FLEXPEN) 100 UNIT/ML FlexPen Inject 10 units into the skin 2 times daily before lunch and supper 15 mL 3   Insulin Glargine (BASAGLAR KWIKPEN) 100 UNIT/ML Inject 65 Units into the skin daily. 18 mL 2   Insulin Pen Needle 31G X 8 MM MISC Inject 1 each into the skin at bedtime. 90 each 1   losartan (COZAAR) 100 MG tablet TAKE 1 TABLET (100 MG TOTAL) BY MOUTH DAILY. 90 tablet 1   metFORMIN (GLUCOPHAGE-XR) 500 MG 24 hr tablet TAKE 1 TABLET (500 MG TOTAL) BY MOUTH 2 (TWO) TIMES DAILY AFTER A MEAL. 180 tablet 2   methimazole (TAPAZOLE) 5 MG tablet TAKE 1 TABLET (5 MG TOTAL) BY  MOUTH EVERY MONDAY, WEDNESDAY, AND FRIDAY. 90 tablet 1   polyethylene glycol (MIRALAX / GLYCOLAX) 17 g packet Take 17 g by mouth daily. 14 each 3   valACYclovir (VALTREX) 1000 MG tablet Take 1 tablet (1,000 mg total) by mouth 3 (three) times daily. 21 tablet 0   No current facility-administered medications on file prior to visit.    Objective:   Vitals:   11/11/21 1006  BP: (!) 148/88  Pulse: 95  Temp: 98 F (36.7 C)  TempSrc: Oral  SpO2: 99%  Weight: 179 lb 9.6 oz (81.5 kg)  Height: $Remove'5\' 2"'sgrYLan$  (1.575 m)    Exam General appearance : Awake, alert, not in any distress. Speech Clear. Not toxic looking HEENT: Atraumatic and Normocephalic, pupils equally reactive to light and accomodation Neck: Supple, no JVD. No cervical lymphadenopathy.  Chest: Good air entry bilaterally, no added sounds  CVS: S1 S2 regular, no murmurs.  Abdomen: Bowel sounds present, Non tender and not distended with no gaurding, rigidity or rebound. Extremities: B/L Lower Ext shows no edema, both legs are warm to touch Neurology: Awake alert, and oriented X 3, CN II-XII intact, Non focal Skin: No Rash  Data Review Lab Results  Component Value Date   HGBA1C 8.2 (A) 11/11/2021   HGBA1C 9.3 (A)  08/11/2021   HGBA1C 9.7 (A) 04/18/2021    Assessment & Plan   1. Encounter for diabetic foot exam ([place]) completed  2. Hyperthyroidism Denies fatigue, weight gain, dry hair/skin, constipation, swelling, decreased memory/brain fog.  64 Mcg. (M-W-F) Recheck lab only in one month.   - TSH + free T4  3. Type 2 diabetes mellitus with complication, with long-term current use of insulin (HCC) Monitor foods that are high in carbohydrates are the following rice, potatoes, breads, sugars, and pastas.  Reduction in the intake (eating) will assist in lowering your blood sugars.  - Lipid Panel - CBC with Differential - HgB A1c 8.2 down froom 9.3 (plz)  4. Hyperkalemia - CMP14+EGFR  5. Essential hypertension We have  discussed target BP range and blood pressure goal. I have advised patient to check BP regularly and to call us back or report to clinic if the numbers are consistently higher than 140/90. We discussed the importance of compliance with medical therapy and DASH diet recommended, consequences of uncontrolled hypertension discussed.  - continue current BP medications  - CMP14+EGFR  6. Need for shingles vaccine - Varicella-zoster vaccine IM (Shingrix)    Patient have been counseled extensively about nutrition and exercise. Other issues discussed during this visit include: low cholesterol diet, weight control and daily exercise, foot care, annual eye examinations at Ophthalmology, importance of adherence with medications and regular follow-up. We also discussed long term complications of uncontrolled diabetes and hypertension.   Return in about 3 months (around 02/08/2022) for DM.  The patient was given clear instructions to go to ER or return to medical center if symptoms don't improve, worsen or new problems develop. The patient verbalized understanding. The patient was told to call to get lab results if they haven't heard anything in the next week.   This note has been created with Surveyor, quantity. Any transcriptional errors are unintentional.   Kerin Perna, NP 11/16/2021, 9:34 PM

## 2021-11-12 LAB — CBC WITH DIFFERENTIAL/PLATELET
Basophils Absolute: 0.1 10*3/uL (ref 0.0–0.2)
Basos: 1 %
EOS (ABSOLUTE): 0.3 10*3/uL (ref 0.0–0.4)
Eos: 3 %
Hematocrit: 32.7 % — ABNORMAL LOW (ref 34.0–46.6)
Hemoglobin: 11.1 g/dL (ref 11.1–15.9)
Immature Grans (Abs): 0 10*3/uL (ref 0.0–0.1)
Immature Granulocytes: 0 %
Lymphocytes Absolute: 2.7 10*3/uL (ref 0.7–3.1)
Lymphs: 26 %
MCH: 28 pg (ref 26.6–33.0)
MCHC: 33.9 g/dL (ref 31.5–35.7)
MCV: 82 fL (ref 79–97)
Monocytes Absolute: 0.6 10*3/uL (ref 0.1–0.9)
Monocytes: 6 %
Neutrophils Absolute: 6.7 10*3/uL (ref 1.4–7.0)
Neutrophils: 64 %
Platelets: 366 10*3/uL (ref 150–450)
RBC: 3.97 x10E6/uL (ref 3.77–5.28)
RDW: 13 % (ref 11.7–15.4)
WBC: 10.5 10*3/uL (ref 3.4–10.8)

## 2021-11-12 LAB — CMP14+EGFR
ALT: 25 IU/L (ref 0–32)
AST: 19 IU/L (ref 0–40)
Albumin/Globulin Ratio: 1.5 (ref 1.2–2.2)
Albumin: 4.5 g/dL (ref 3.8–4.9)
Alkaline Phosphatase: 137 IU/L — ABNORMAL HIGH (ref 44–121)
BUN/Creatinine Ratio: 25 — ABNORMAL HIGH (ref 9–23)
BUN: 30 mg/dL — ABNORMAL HIGH (ref 6–24)
Bilirubin Total: 0.4 mg/dL (ref 0.0–1.2)
CO2: 22 mmol/L (ref 20–29)
Calcium: 10 mg/dL (ref 8.7–10.2)
Chloride: 106 mmol/L (ref 96–106)
Creatinine, Ser: 1.18 mg/dL — ABNORMAL HIGH (ref 0.57–1.00)
Globulin, Total: 3 g/dL (ref 1.5–4.5)
Glucose: 114 mg/dL — ABNORMAL HIGH (ref 70–99)
Potassium: 5.9 mmol/L — ABNORMAL HIGH (ref 3.5–5.2)
Sodium: 140 mmol/L (ref 134–144)
Total Protein: 7.5 g/dL (ref 6.0–8.5)
eGFR: 55 mL/min/{1.73_m2} — ABNORMAL LOW (ref >59)

## 2021-11-12 LAB — LIPID PANEL
Chol/HDL Ratio: 3.3 ratio (ref 0.0–4.4)
Cholesterol, Total: 128 mg/dL (ref 100–199)
HDL: 39 mg/dL — ABNORMAL LOW (ref >39)
LDL Chol Calc (NIH): 68 mg/dL (ref 0–99)
Triglycerides: 114 mg/dL (ref 0–149)
VLDL Cholesterol Cal: 21 mg/dL (ref 5–40)

## 2021-11-12 LAB — TSH+FREE T4
Free T4: 1.39 ng/dL (ref 0.82–1.77)
TSH: 1.8 u[IU]/mL (ref 0.450–4.500)

## 2021-11-13 ENCOUNTER — Other Ambulatory Visit: Payer: Self-pay

## 2021-11-25 ENCOUNTER — Other Ambulatory Visit: Payer: Self-pay

## 2021-11-25 ENCOUNTER — Ambulatory Visit (INDEPENDENT_AMBULATORY_CARE_PROVIDER_SITE_OTHER): Payer: No Typology Code available for payment source | Admitting: Podiatry

## 2021-11-25 DIAGNOSIS — E1149 Type 2 diabetes mellitus with other diabetic neurological complication: Secondary | ICD-10-CM

## 2021-11-25 DIAGNOSIS — B351 Tinea unguium: Secondary | ICD-10-CM

## 2021-11-25 MED ORDER — CICLOPIROX 8 % EX SOLN
Freq: Every day | CUTANEOUS | 2 refills | Status: DC
  Filled 2021-11-25: qty 6.6, 28d supply, fill #0

## 2021-11-25 NOTE — Patient Instructions (Signed)
Ciclopirox nail solution ?What is this medication? ?CICLOPIROX (sye kloe PEER ox) NAIL SOLUTION is an antifungal medicine. It used to treat fungal infections of the nails. ?This medicine may be used for other purposes; ask your health care provider or pharmacist if you have questions. ?COMMON BRAND NAME(S): Ciclodan Nail Solution, CNL8, Penlac ?What should I tell my care team before I take this medication? ?They need to know if you have any of these conditions: ?diabetes mellitus ?history of seizures ?HIV infection ?immune system problems or organ transplant ?large areas of burned or damaged skin ?peripheral vascular disease or poor circulation ?taking corticosteroid medication (including steroid inhalers, cream, or lotion) ?an unusual or allergic reaction to ciclopirox, isopropyl alcohol, other medicines, foods, dyes, or preservatives ?pregnant or trying to get pregnant ?breast-feeding ?How should I use this medication? ?This medicine is for external use only. Follow the directions that come with this medicine exactly. Wash and dry your hands before use. Avoid contact with the eyes, mouth or nose. If you do get this medicine in your eyes, rinse out with plenty of cool tap water. Contact your doctor or health care professional if eye irritation occurs. Use at regular intervals. Do not use your medicine more often than directed. Finish the full course prescribed by your doctor or health care professional even if you think you are better. Do not stop using except on your doctor's advice. ?Talk to your pediatrician regarding the use of this medicine in children. While this medicine may be prescribed for children as young as 12 years for selected conditions, precautions do apply. ?Overdosage: If you think you have taken too much of this medicine contact a poison control center or emergency room at once. ?NOTE: This medicine is only for you. Do not share this medicine with others. ?What if I miss a dose? ?If you miss a  dose, use it as soon as you can. If it is almost time for your next dose, use only that dose. Do not use double or extra doses. ?What may interact with this medication? ?Interactions are not expected. Do not use any other skin products without telling your doctor or health care professional. ?This list may not describe all possible interactions. Give your health care provider a list of all the medicines, herbs, non-prescription drugs, or dietary supplements you use. Also tell them if you smoke, drink alcohol, or use illegal drugs. Some items may interact with your medicine. ?What should I watch for while using this medication? ?Tell your doctor or health care professional if your symptoms get worse. Four to six months of treatment may be needed for the nail(s) to improve. Some people may not achieve a complete cure or clearing of the nails by this time. ?Tell your doctor or health care professional if you develop sores or blisters that do not heal properly. If your nail infection returns after stopping using this product, contact your doctor or health care professional. ?What side effects may I notice from receiving this medication? ?Side effects that you should report to your doctor or health care professional as soon as possible: ?allergic reactions like skin rash, itching or hives, swelling of the face, lips, or tongue ?severe irritation, redness, burning, blistering, peeling, swelling, oozing ?Side effects that usually do not require medical attention (report to your doctor or health care professional if they continue or are bothersome): ?mild reddening of the skin ?nail discoloration ?temporary burning or mild stinging at the site of application ?This list may not describe all possible   side effects. Call your doctor for medical advice about side effects. You may report side effects to FDA at 1-800-FDA-1088. ?Where should I keep my medication? ?Keep out of the reach of children. ?Store at room temperature  between 15 and 30 degrees C (59 and 86 degrees F). Do not freeze. Protect from light by storing the bottle in the carton after every use. This medicine is flammable. Keep away from heat and flame. Throw away any unused medicine after the expiration date. ?NOTE: This sheet is a summary. It may not cover all possible information. If you have questions about this medicine, talk to your doctor, pharmacist, or health care provider. ?? 2022 Elsevier/Gold Standard (2008-01-25 00:00:00) ? ?

## 2021-11-28 NOTE — Progress Notes (Signed)
Subjective:  ? ?Patient ID: Rebecca [name], female   DOB: 56 y.o.   MRN: [id number]  ? ?HPI ?56 year old female presents the office today for concerns of left nail fungus possibly pills longer about 2 years.  No discoloration.  She has no significant pain on ongoing basis.  She denies any swelling or redness or any drainage.  No recent treatment.  She is cut back A1c is 8.1.  She has a history of irritation to the right first and second digits. ? ? ?Review of Systems  ?All other systems reviewed and are negative. ? ?Past Medical History:  ?Diagnosis Date  ? Anemia   ? Diabetes mellitus   ? Dyslipidemia   ? Hypertension   ? Non-STEMI (non-ST elevated myocardial infarction) ([place])   ? Medical therapy for distal LAD dissection  ? Thyroid disease   ? ? ?Past Surgical History:  ?Procedure Laterality Date  ? AMPUTATION Right 07/21/2017  ? Procedure: RIGHT GREAT TOE AMPUTATION;  Surgeon: Newt Minion, MD;  Location: [facility];  Service: Orthopedics;  Laterality: Right;  ? AMPUTATION Right 07/05/2020  ? Procedure: RIGHT FOOT 2ND RAY AMPUTATION;  Surgeon: Newt Minion, MD;  Location: [facility];  Service: Orthopedics;  Laterality: Right;  ? CARDIAC CATHETERIZATION N/A 05/14/2015  ? Procedure: Left Heart Cath and Coronary Angiography;  Surgeon: Leonie Man, MD; dLAD 60%>>95% (small vessel, ?dissection), CFX & RCA systems no sig dz, EF & LVEDP nl     ? ? ? ?Current Outpatient Medications:  ?  ciclopirox (PENLAC) 8 % solution, Apply topically at bedtime. Apply over nail and surrounding skin. Apply daily over previous coat. After seven (7) days, may remove with alcohol and continue cycle., Disp: 6.6 mL, Rfl: 2 ?  amLODipine (NORVASC) 10 MG tablet, TAKE 1 TABLET (10 MG TOTAL) BY MOUTH DAILY., Disp: 90 tablet, Rfl: 3 ?  aspirin 81 MG EC tablet, Take 1 tablet (81 mg total) by mouth daily., Disp: 90 tablet, Rfl: 1 ?  atorvastatin (LIPITOR) 40 MG tablet, TAKE 1 TABLET (40 MG TOTAL) BY MOUTH AT BEDTIME., Disp: 90 tablet, Rfl: 3 ?   Dulaglutide (TRULICITY) 3 0000000 SOPN, Inject 3 mg as directed once a week., Disp: 3 mL, Rfl: 6 ?  gabapentin (NEURONTIN) 400 MG capsule, TAKE 1 CAPSULE (400 MG TOTAL) BY MOUTH 4 (FOUR) TIMES DAILY., Disp: 360 capsule, Rfl: 1 ?  insulin aspart (NOVOLOG FLEXPEN) 100 UNIT/ML FlexPen, Inject 10 units into the skin 2 times daily before lunch and supper, Disp: 15 mL, Rfl: 3 ?  Insulin Glargine (BASAGLAR KWIKPEN) 100 UNIT/ML, Inject 65 Units into the skin daily., Disp: 18 mL, Rfl: 2 ?  Insulin Pen Needle 31G X 8 MM MISC, Inject 1 each into the skin at bedtime., Disp: 90 each, Rfl: 1 ?  losartan (COZAAR) 100 MG tablet, TAKE 1 TABLET (100 MG TOTAL) BY MOUTH DAILY., Disp: 90 tablet, Rfl: 1 ?  metFORMIN (GLUCOPHAGE-XR) 500 MG 24 hr tablet, TAKE 1 TABLET (500 MG TOTAL) BY MOUTH 2 (TWO) TIMES DAILY AFTER A MEAL., Disp: 180 tablet, Rfl: 2 ?  methimazole (TAPAZOLE) 5 MG tablet, TAKE 1 TABLET (5 MG TOTAL) BY MOUTH EVERY MONDAY, WEDNESDAY, AND FRIDAY., Disp: 90 tablet, Rfl: 1 ?  polyethylene glycol (MIRALAX / GLYCOLAX) 17 g packet, Take 17 g by mouth daily., Disp: 14 each, Rfl: 3 ?  valACYclovir (VALTREX) 1000 MG tablet, Take 1 tablet (1,000 mg total) by mouth 3 (three) times daily., Disp: 21 tablet, Rfl: 0 ? ?No  Known Allergies ? ? ? ?   ?Objective:  ?Physical Exam  ?General: AAO x3, NAD ? ?Dermatological: Left hallux toenails hypertrophic, dystrophic with yellow discoloration.  There is no pain there is no edema, erythema or signs of infection.  There is no open lesions noted bilaterally. ? ?Vascular: Dorsalis Pedis artery and Posterior Tibial artery pedal pulses are 2/4 bilateral with immedate capillary fill time. There is no pain with calf compression, swelling, warmth, erythema.  ? ?Neruologic: Sensation decreased. ? ?Musculoskeletal: Previous right first second toe amputations muscular strength 5/5 in all groups tested bilateral. ? ?Gait: Unassisted, Nonantalgic.  ? ? ?   ?Assessment:  ? ?56 year-old female with  onychomycosis ? ?   ?Plan:  ?-Treatment options discussed including all alternatives, risks, and complications ?-Etiology of symptoms were discussed ?-Sharply debrided nails without any complications or bleeding.  Prescribed Penlac. ?-Discussed daily foot inspection ? ?Trula Slade DPM ? ?   ? ?

## 2021-12-16 ENCOUNTER — Other Ambulatory Visit (INDEPENDENT_AMBULATORY_CARE_PROVIDER_SITE_OTHER): Payer: Self-pay | Admitting: Primary Care

## 2021-12-16 ENCOUNTER — Other Ambulatory Visit: Payer: Self-pay

## 2021-12-16 DIAGNOSIS — E1165 Type 2 diabetes mellitus with hyperglycemia: Secondary | ICD-10-CM

## 2021-12-16 MED ORDER — GABAPENTIN 400 MG PO CAPS
ORAL_CAPSULE | ORAL | 1 refills | Status: DC
  Filled 2021-12-16: qty 120, 30d supply, fill #0
  Filled 2022-01-19: qty 120, 30d supply, fill #1
  Filled 2022-02-20: qty 120, 30d supply, fill #2
  Filled 2022-04-01: qty 120, 30d supply, fill #3
  Filled 2022-05-07: qty 120, 30d supply, fill #4

## 2021-12-22 ENCOUNTER — Other Ambulatory Visit: Payer: Self-pay

## 2022-01-19 ENCOUNTER — Other Ambulatory Visit (INDEPENDENT_AMBULATORY_CARE_PROVIDER_SITE_OTHER): Payer: Self-pay | Admitting: Primary Care

## 2022-01-19 ENCOUNTER — Other Ambulatory Visit: Payer: Self-pay

## 2022-01-19 DIAGNOSIS — I10 Essential (primary) hypertension: Secondary | ICD-10-CM

## 2022-01-19 MED ORDER — LOSARTAN POTASSIUM 100 MG PO TABS
ORAL_TABLET | Freq: Every day | ORAL | 0 refills | Status: DC
  Filled 2022-01-19: qty 30, 30d supply, fill #0

## 2022-01-20 ENCOUNTER — Other Ambulatory Visit: Payer: Self-pay

## 2022-01-21 ENCOUNTER — Other Ambulatory Visit: Payer: Self-pay

## 2022-02-09 ENCOUNTER — Encounter (INDEPENDENT_AMBULATORY_CARE_PROVIDER_SITE_OTHER): Payer: Self-pay | Admitting: Primary Care

## 2022-02-09 ENCOUNTER — Other Ambulatory Visit: Payer: Self-pay

## 2022-02-09 ENCOUNTER — Ambulatory Visit (INDEPENDENT_AMBULATORY_CARE_PROVIDER_SITE_OTHER): Payer: Self-pay | Admitting: Primary Care

## 2022-02-09 VITALS — BP 136/83 | HR 98 | Temp 98.4°F | Ht 62.0 in | Wt 179.8 lb

## 2022-02-09 DIAGNOSIS — E0590 Thyrotoxicosis, unspecified without thyrotoxic crisis or storm: Secondary | ICD-10-CM

## 2022-02-09 DIAGNOSIS — Z6832 Body mass index (BMI) 32.0-32.9, adult: Secondary | ICD-10-CM

## 2022-02-09 DIAGNOSIS — I10 Essential (primary) hypertension: Secondary | ICD-10-CM

## 2022-02-09 DIAGNOSIS — Z794 Long term (current) use of insulin: Secondary | ICD-10-CM

## 2022-02-09 DIAGNOSIS — E119 Type 2 diabetes mellitus without complications: Secondary | ICD-10-CM

## 2022-02-09 LAB — POCT GLYCOSYLATED HEMOGLOBIN (HGB A1C): Hemoglobin A1C: 8.7 % — AB (ref 4.0–5.6)

## 2022-02-09 MED ORDER — METHIMAZOLE 5 MG PO TABS
ORAL_TABLET | ORAL | 1 refills | Status: DC
  Filled 2022-02-09: qty 12, 30d supply, fill #0
  Filled 2022-02-20: qty 12, 28d supply, fill #0
  Filled 2022-04-01: qty 12, 28d supply, fill #1
  Filled 2022-05-07: qty 12, 28d supply, fill #2

## 2022-02-09 NOTE — Progress Notes (Signed)
[facility]  Rebecca [name], is a 56 y.o. female  [id number]  [id number]  DOB - [birth year]  Chief Complaint  Patient presents with   Follow-up    Diabetes /hyperlipidemia       Subjective:   Mr.Rebecca [name] is a 55 y.o.  Hispanic female here today for a follow up visit. management of  hypertension and T2D. Patient has No headache, No chest pain, No abdominal pain - No Nausea, No new weakness tingling or numbness, No Cough - shortness of breath. SAUL [id number] Compliant with meds - Yes Checking CBGs? Yes Fasting avg - 170-300,Hypoglycemic events - No Exercising regularly? - Yes   Neuropathy ? - Yes Pertinent ROS:  Polyuria - No Polydipsia - No Vision problems - No  No problems updated.  No Known Allergies  Past Medical History:  Diagnosis Date   Anemia    Diabetes mellitus    Dyslipidemia    Hypertension    Non-STEMI (non-ST elevated myocardial infarction) [facility])    Medical therapy for distal LAD dissection   Thyroid disease     Current Outpatient Medications on File Prior to Visit  Medication Sig Dispense Refill   amLODipine (NORVASC) 10 MG tablet TAKE 1 TABLET (10 MG TOTAL) BY MOUTH DAILY. 90 tablet 3   aspirin 81 MG EC tablet Take 1 tablet (81 mg total) by mouth daily. 90 tablet 1   atorvastatin (LIPITOR) 40 MG tablet TAKE 1 TABLET (40 MG TOTAL) BY MOUTH AT BEDTIME. 90 tablet 3   ciclopirox (PENLAC) 8 % solution Apply topically at bedtime. Apply over nail and surrounding skin. Apply daily over previous coat. After seven (7) days, may remove with alcohol and continue cycle. 6.6 mL 2   Dulaglutide (TRULICITY) 3 MG/0.5ML SOPN Inject 3 mg as directed once a week. 3 mL 6   gabapentin (NEURONTIN) 400 MG capsule TAKE 1 CAPSULE (400 MG TOTAL) BY MOUTH 4 (FOUR) TIMES DAILY. 360 capsule 1   insulin aspart (NOVOLOG FLEXPEN) 100 UNIT/ML FlexPen Inject 10 units into the skin 2 times daily before lunch and supper 15 mL 3   Insulin Glargine  (BASAGLAR KWIKPEN) 100 UNIT/ML Inject 65 Units into the skin daily. 18 mL 2   Insulin Pen Needle 31G X 8 MM MISC Inject 1 each into the skin at bedtime. 90 each 1   losartan (COZAAR) 100 MG tablet TAKE 1 TABLET (100 MG TOTAL) BY MOUTH DAILY. 30 tablet 0   metFORMIN (GLUCOPHAGE-XR) 500 MG 24 hr tablet TAKE 1 TABLET (500 MG TOTAL) BY MOUTH 2 (TWO) TIMES DAILY AFTER A MEAL. 180 tablet 2   No current facility-administered medications on file prior to visit.   Comprehensive ROS Pertinent positive and negative noted in HPI   Objective:   Vitals:   02/09/22 1124  BP: 136/83  Pulse: 98  Temp: 98.4 F (36.9 C)  TempSrc: Oral  SpO2: 99%  Weight: 179 lb 12.8 oz (81.6 kg)  Height: 5\' 2"  (1.575 m)    Exam General appearance : Awake, alert, not in any distress. Speech Clear. Not toxic looking HEENT: Atraumatic and Normocephalic, pupils equally reactive to light and accomodation Neck: Supple, no JVD. No cervical lymphadenopathy.  Chest: Good air entry bilaterally, no added sounds  CVS: S1 S2 regular, no murmurs.  Abdomen: Bowel sounds present, Non tender and not distended with no gaurding, rigidity or rebound. Extremities: B/L Lower Ext shows no edema, both legs are warm to touch Neurology: Awake alert, and oriented X 3, CN II-XII intact,  Non focal Skin: No Rash  Data Review Lab Results  Component Value Date   HGBA1C 8.7 (A) 02/09/2022   HGBA1C 8.2 (A) 11/11/2021   HGBA1C 9.3 (A) 08/11/2021    Assessment & Plan  Rebecca [name] was seen today for follow-up.  Diagnoses and all orders for this visit:  Type 2 diabetes mellitus without complication, with long-term current use of insulin (HCC) -     HgB A1c 8.7 increase from 8.2 . Need to change medication while her daughter is present to explain changes. Discuss with clinical pharmacist and clarify she is picking up medication every 3 months there may be some laps in days.  Continue to monitor foods that are high in carbohydrates are the  following rice, potatoes, breads, tortillas, sugars, and pastas.  Reduction in the intake (eating) will assist in lowering your blood sugars.   Class 2 severe obesity due to excess calories with serious comorbidity in adult, unspecified BMI (HCC) Discussed diet and exercise for person with BMI >25. Instructed: You must burn more calories than you eat. Losing 5 percent of your body weight should be considered a success. In the longer term, losing more than 15 percent of your body weight and staying at this weight is an extremely good result. However, keep in mind that even losing 5 percent of your body weight leads to important health benefits, so try not to get discouraged if you're not able to lose more than this. Will recheck weight in 3-6 months.   Hyperthyroidism -     methimazole (TAPAZOLE) 5 MG tablet; TAKE 1 TABLET (5 MG TOTAL) BY MOUTH EVERY MONDAY, WEDNESDAY, AND FRIDAY.  Essential hypertension .We have discussed target BP range and blood pressure goal. She is improving .We discussed the importance of compliance with medical therapy and DASH diet recommended, consequences of uncontrolled hypertension discussed.  - continue current BP medications    Patient have been counseled extensively about nutrition and exercise. Other issues discussed during this visit include: low cholesterol diet, weight control and daily exercise, foot care, annual eye examinations at Ophthalmology, importance of adherence with medications and regular follow-up. We also discussed long term complications of uncontrolled diabetes and hypertension.   Return in about 3 months (around 05/12/2022) for DM.  The patient was given clear instructions to go to ER or return to medical center if symptoms don't improve, worsen or new problems develop. The patient verbalized understanding. The patient was told to call to get lab results if they haven't heard anything in the next week.   This note has been created with Biomedical engineer. Any transcriptional errors are unintentional.   Grayce Sessions, NP 02/16/2022, 8:27 PM

## 2022-02-17 ENCOUNTER — Other Ambulatory Visit: Payer: Self-pay

## 2022-02-17 ENCOUNTER — Encounter (INDEPENDENT_AMBULATORY_CARE_PROVIDER_SITE_OTHER): Payer: Self-pay | Admitting: Primary Care

## 2022-02-19 ENCOUNTER — Ambulatory Visit: Payer: No Typology Code available for payment source | Admitting: Podiatry

## 2022-02-20 ENCOUNTER — Other Ambulatory Visit: Payer: Self-pay

## 2022-02-20 ENCOUNTER — Other Ambulatory Visit (INDEPENDENT_AMBULATORY_CARE_PROVIDER_SITE_OTHER): Payer: Self-pay | Admitting: Nurse Practitioner

## 2022-02-20 DIAGNOSIS — I10 Essential (primary) hypertension: Secondary | ICD-10-CM

## 2022-02-21 ENCOUNTER — Other Ambulatory Visit (INDEPENDENT_AMBULATORY_CARE_PROVIDER_SITE_OTHER): Payer: Self-pay | Admitting: Primary Care

## 2022-02-21 MED ORDER — LOSARTAN POTASSIUM 100 MG PO TABS
ORAL_TABLET | Freq: Every day | ORAL | 1 refills | Status: DC
  Filled 2022-02-21: qty 30, 30d supply, fill #0
  Filled 2022-04-01: qty 30, 30d supply, fill #1
  Filled 2022-05-07: qty 30, 30d supply, fill #2

## 2022-02-23 ENCOUNTER — Ambulatory Visit (INDEPENDENT_AMBULATORY_CARE_PROVIDER_SITE_OTHER): Payer: Self-pay

## 2022-02-23 ENCOUNTER — Other Ambulatory Visit: Payer: Self-pay

## 2022-02-23 NOTE — Telephone Encounter (Signed)
  Chief Complaint: phantom pain Symptoms: phantom pain in R foot where great toe and index toe was amputated 2 years ago  Frequency: 3 weeks Pertinent Negatives: Patient denies swelling or any skin issues Disposition: [] ED /[] Urgent Care (no appt availability in office) / [] Appointment(In office/virtual)/ []  [facility]/ [x] Home Care/ [] Refused Recommended Disposition /[] [facility]/ []  Follow-up with PCP Additional Notes: pt was calling to see if it is normal to have pain after having toes removed and wanted to make sure she didn't need to come in for an appt. Pt's daughter had came in and had info for ortho provider who did the surgery and pt states she would call them to see if she needed an appt with them instead of Michelle. Care advice given and pt verbalized understanding. No other questions/concerns noted.   Reason for Disposition  Foot pain  Answer Assessment - Initial Assessment Questions 1. ONSET: "When did the pain start?"      3 weeks ago  2. LOCATION: "Where is the pain located?"      Right foot, great toe and index toe  3. PAIN: "How bad is the pain?"    (Scale 1-10; or mild, moderate, severe)  - MILD (1-3): doesn't interfere with normal activities.   - MODERATE (4-7): interferes with normal activities (e.g., work or school) or awakens from sleep, limping.   - SEVERE (8-10): excruciating pain, unable to do any normal activities, unable to walk.      6, worse at night  6. OTHER SYMPTOMS: "Do you have any other symptoms?" (e.g., leg pain, rash, fever, numbness)     Affects daily activity  Protocols used: Foot Pain-A-AH

## 2022-02-24 ENCOUNTER — Other Ambulatory Visit: Payer: Self-pay

## 2022-03-02 ENCOUNTER — Other Ambulatory Visit: Payer: Self-pay

## 2022-04-01 ENCOUNTER — Other Ambulatory Visit: Payer: Self-pay

## 2022-04-27 ENCOUNTER — Telehealth: Payer: Self-pay | Admitting: Physician Assistant

## 2022-04-27 DIAGNOSIS — H66002 Acute suppurative otitis media without spontaneous rupture of ear drum, left ear: Secondary | ICD-10-CM

## 2022-04-27 DIAGNOSIS — R42 Dizziness and giddiness: Secondary | ICD-10-CM

## 2022-04-27 MED ORDER — MECLIZINE HCL 25 MG PO TABS: 12.5000 mg | ORAL_TABLET | Freq: Three times a day (TID) | ORAL | 0 refills | Status: DC | PRN

## 2022-04-27 MED ORDER — NEOMYCIN-POLYMYXIN-HC 3.5-10000-1 OT SOLN: 3.0000 [drp] | Freq: Four times a day (QID) | OTIC | 0 refills | Status: DC

## 2022-04-27 MED ORDER — AMOXICILLIN 500 MG PO CAPS: 500.0000 mg | ORAL_CAPSULE | Freq: Two times a day (BID) | ORAL | 0 refills | Status: AC

## 2022-04-27 NOTE — Patient Instructions (Signed)
Williams Che, thank you for joining Margaretann Loveless, PA-C for today's virtual visit.  While this provider is not your primary care provider (PCP), if your PCP is located in our provider database this encounter information will be shared with them immediately following your visit.  Consent: (Patient) Rebecca [name] provided verbal consent for this virtual visit at the beginning of the encounter.  Current Medications:  Current Outpatient Medications:    amoxicillin (AMOXIL) 500 MG capsule, Take 1 capsule (500 mg total) by mouth 2 (two) times daily for 10 days., Disp: 20 capsule, Rfl: 0   meclizine (ANTIVERT) 25 MG tablet, Take 0.5-1 tablets (12.5-25 mg total) by mouth 3 (three) times daily as needed for dizziness., Disp: 30 tablet, Rfl: 0   neomycin-polymyxin-hydrocortisone (CORTISPORIN) OTIC solution, Place 3 drops into the left ear 4 (four) times daily. X 7 days, Disp: 10 mL, Rfl: 0   amLODipine (NORVASC) 10 MG tablet, TAKE 1 TABLET (10 MG TOTAL) BY MOUTH DAILY., Disp: 90 tablet, Rfl: 3   aspirin 81 MG EC tablet, Take 1 tablet (81 mg total) by mouth daily., Disp: 90 tablet, Rfl: 1   atorvastatin (LIPITOR) 40 MG tablet, TAKE 1 TABLET (40 MG TOTAL) BY MOUTH AT BEDTIME., Disp: 90 tablet, Rfl: 3   ciclopirox (PENLAC) 8 % solution, Apply topically at bedtime. Apply over nail and surrounding skin. Apply daily over previous coat. After seven (7) days, may remove with alcohol and continue cycle., Disp: 6.6 mL, Rfl: 2   Dulaglutide (TRULICITY) 3 MG/0.5ML SOPN, Inject 3 mg as directed once a week., Disp: 3 mL, Rfl: 6   gabapentin (NEURONTIN) 400 MG capsule, TAKE 1 CAPSULE (400 MG TOTAL) BY MOUTH 4 (FOUR) TIMES DAILY., Disp: 360 capsule, Rfl: 1   insulin aspart (NOVOLOG FLEXPEN) 100 UNIT/ML FlexPen, Inject 10 units into the skin 2 times daily before lunch and supper, Disp: 15 mL, Rfl: 3   Insulin Glargine (BASAGLAR KWIKPEN) 100 UNIT/ML, Inject 65 Units into the skin daily., Disp: 18  mL, Rfl: 2   Insulin Pen Needle 31G X 8 MM MISC, Inject 1 each into the skin at bedtime., Disp: 90 each, Rfl: 1   losartan (COZAAR) 100 MG tablet, TAKE 1 TABLET (100 MG TOTAL) BY MOUTH DAILY., Disp: 90 tablet, Rfl: 1   metFORMIN (GLUCOPHAGE-XR) 500 MG 24 hr tablet, TAKE 1 TABLET (500 MG TOTAL) BY MOUTH 2 (TWO) TIMES DAILY AFTER A MEAL., Disp: 180 tablet, Rfl: 2   methimazole (TAPAZOLE) 5 MG tablet, TAKE 1 TABLET (5 MG TOTAL) BY MOUTH EVERY MONDAY, WEDNESDAY, AND FRIDAY., Disp: 90 tablet, Rfl: 1   Medications ordered in this encounter:  Meds ordered this encounter  Medications   amoxicillin (AMOXIL) 500 MG capsule    Sig: Take 1 capsule (500 mg total) by mouth 2 (two) times daily for 10 days.    Dispense:  20 capsule    Refill:  0    Order Specific Question:   Supervising Provider    Answer:   MILLER, BRIAN [[id number]]   neomycin-polymyxin-hydrocortisone (CORTISPORIN) OTIC solution    Sig: Place 3 drops into the left ear 4 (four) times daily. X 7 days    Dispense:  10 mL    Refill:  0    Order Specific Question:   Supervising Provider    Answer:   Hyacinth Meeker, BRIAN [[id number]]   meclizine (ANTIVERT) 25 MG tablet    Sig: Take 0.5-1 tablets (12.5-25 mg total) by mouth 3 (three) times daily as needed for  dizziness.    Dispense:  30 tablet    Refill:  0    Order Specific Question:   Supervising Provider    Answer:   Hyacinth Meeker, BRIAN [[id number]]     *If you need refills on other medications prior to your next appointment, please contact your pharmacy*  Follow-Up: Call back or seek an in-person evaluation if the symptoms worsen or if the condition fails to improve as anticipated.  Other Instructions Otitis media en los adultos Otitis Media, Adult  La otitis media es la inflamacin y la acumulacin de lquido en el odo [place], que se manifiesta con signos y sntomas de una infeccin [place]. El odo medio es la parte del odo que contiene los huesos de la audicin, as Neurosurgeon aire que ayuda a Corporate treasurer los  sonidos al cerebro. Cuando se acumula lquido infectado en este espacio, genera presin y Chiropractor una infeccin en el odo. La trompa de [place] conecta el odo medio con la parte posterior de la nariz (nasofaringe) y, normalmente, permite que ingrese aire en el odo [place]. Si la trompa de [place] se obstruye, puede acumularse lquido e infectarse. Cules son las causas? Esta afeccin es consecuencia de una obstruccin en la trompa de [place]. La causa puede ser una mucosidad o la hinchazn de la trompa. Algunos de los problemas que pueden causar Neomia Dear obstruccin son los siguientes: Resfriados u otra infeccin de las vas respiratorias superiores. Alergias. Un irritante, como el humo del tabaco. Adenoides agrandadas. Las adenoides son zonas de tejido blando ubicadas en la parte posterior de la garganta, detrs de la nariz y Advice worker. Sherron Monday parte del sistema de defensa del organismo (sistema inmunitario). Un bulto en la nasofaringe. Dao en el odo a causa de cambios de presin (barotraumatismo). Qu incrementa el riesgo? Es ms probable que tenga esta afeccin si: Fuma o se expone al humo de tabaco. Tiene una abertura en la parte superior de la boca (hendidura del paladar). Tiene reflujo gastroesofgico. Tiene un trastorno del sistema inmunitario. Cules son los signos o sntomas? Los sntomas de esta afeccin incluyen: Dolor de odo. Grant Ruts. Disminucin de la audicin. Cansancio (letargo). Supuracin de lquido por el odo, si el tmpano se rompe o revienta. Zumbidos en el odo. Cmo se diagnostica?  Esta afeccin se diagnostica mediante un examen fsico. Durante el examen, con un instrumento llamado otoscopio, el mdico mirar dentro del odo para Counsellor, hinchazn y presencia de lquido. Tambin le preguntar acerca de sus sntomas. El mdico tambin puede indicarle [place], como: Una otoscopia neumtica. Es un estudio que se realiza para  Loss adjuster, chartered movimiento del tmpano. Se realiza introduciendo una pequea cantidad de aire en el odo. Un timpanograma. Es un estudio que muestra si el tmpano se [employer] en respuesta a la presin de aire en el canal [place]. Genera un grfico para que el mdico lo evale. Cmo se trata? Esta afeccin puede desaparecer sin tratamiento en el transcurso de 3 a 5 das. Sin embargo, si la causa de la afeccin es una infeccin bacteriana y no desaparece sin tratamiento, o si vuelve a aparecer ms de una vez, el mdico podra hacer lo siguiente: Recetarle antibiticos para tratar la infeccin. Recetarle o recomendarle medicamentos para Human resources officer. Siga estas indicaciones en su casa: Use los medicamentos de venta libre y los recetados solamente como se lo haya indicado el mdico. Si le recetaron un antibitico, tmelo como se lo haya indicado el mdico. No deje de tomar el antibitico aunque comience  a sentirse mejor. Concurra a todas las visitas de seguimiento. Esto es importante. Comunquese con un mdico si: Le sangra la nariz. Tiene un bulto en el cuello. No se siente mejor al cabo de 5 das. Empeora en lugar de mejorar. Solicite ayuda de inmediato si: Tiene dolor intenso que no puede controlar con medicamentos. Tiene hinchazn, enrojecimiento o dolor en el odo. Siente rigidez en el cuello. Una parte de su rostro no se mueve (paralizada). Le duele el hueso que se encuentra detrs del odo (mastoides) al tocarlo. Desarrolla un dolor de cabeza intenso. Resumen La otitis media es el enrojecimiento, Chief Technology Officer y la hinchazn del odo medio, que generalmente provoca dolor y disminucin de la audicin. Esta afeccin puede desaparecer sin tratamiento en el transcurso de 3 a 5 das. Si el problema no desaparece en el trmino de 3 a 5 das, el mdico puede recetarle medicamentos para tratar la infeccin. Si le recetaron un antibitico, tmelo como se lo haya indicado el mdico. Siga todas las  instrucciones que le haya dado su mdico. Esta informacin no tiene Theme park manager el consejo del mdico. Asegrese de hacerle al mdico cualquier pregunta que tenga. Document Revised: 01/10/2021 Document Reviewed: 01/10/2021 Elsevier Patient Education  2023 Elsevier Inc.    If you have been instructed to have an in-person evaluation today at a local Urgent Care facility, please use the link below. It will take you to a list of all of our available [facility], including address, phone number and hours of operation. Please do not delay care.  [facility]  If you or a family member do not have a primary care provider, use the link below to schedule a visit and establish care. When you choose a [facility] primary care physician or advanced practice provider, you gain a long-term partner in health. Find a Primary Care Provider  Learn more about [facility] in-office and virtual care options: [facility] - Get Care Now

## 2022-04-27 NOTE — Progress Notes (Signed)
Virtual Visit Consent   Rebecca [name], you are scheduled for a virtual visit with a [facility] provider today. Just as with appointments in the office, your consent must be obtained to participate. Your consent will be active for this visit and any virtual visit you may have with one of our providers in the next 365 days. If you have a MyChart account, a copy of this consent can be sent to you electronically.  As this is a virtual visit, video technology does not allow for your provider to perform a traditional examination. This may limit your provider's ability to fully assess your condition. If your provider identifies any concerns that need to be evaluated in person or the need to arrange testing (such as labs, EKG, etc.), we will make arrangements to do so. Although advances in technology are sophisticated, we cannot ensure that it will always work on either your end or our end. If the connection with a video visit is poor, the visit may have to be switched to a telephone visit. With either a video or telephone visit, we are not always able to ensure that we have a secure connection.  By engaging in this virtual visit, you consent to the provision of healthcare and authorize for your insurance to be billed (if applicable) for the services provided during this visit. Depending on your insurance coverage, you may receive a charge related to this service.  I need to obtain your verbal consent now. Are you willing to proceed with your visit today? Rebecca [name] has provided verbal consent on 04/27/2022 for a virtual visit (video or telephone). Rebecca Loveless, PA-C  Date: 04/27/2022 6:16 PM  Virtual Visit via Video Note   I, Rebecca [name], connected with  Rebecca [name]  ([id number], [birth year]) on 04/27/22 at  6:00 PM EDT by a video-enabled telemedicine application and verified that I am speaking with the correct person using two identifiers. Daughter, Rebecca [name], assisted in the visit.  Location: Patient: Virtual Visit Location Patient: Home Provider: Virtual Visit Location Provider: Home Office   I discussed the limitations of evaluation and management by telemedicine and the availability of in person appointments. The patient expressed understanding and agreed to proceed.    Interactive audio and video communications were attempted, although failed due to patient's inability to connect to video. Continued visit with audio only interaction with patient agreement.   History of Present Illness: Rebecca [name] is a 56 y.o. who identifies as a female who was assigned female at birth, and is being seen today for dizziness and tiredness.  HPI: Dizziness This is a new problem. The current episode started 1 to 4 weeks ago (2 weeks, worsened over last 2 days). The problem occurs constantly. The problem has been gradually worsening. Associated symptoms include chills, congestion, fatigue, a fever, headaches, myalgias and vertigo. Pertinent negatives include no anorexia, arthralgias, chest pain, coughing, nausea, numbness, rash, sore throat, visual change, vomiting or weakness. Associated symptoms comments: Left ear pain. The symptoms are aggravated by standing and exertion. She has tried nothing for the symptoms. The treatment provided no relief.  Blood sugar today was 240, reports low for her; BP not checked    Problems:  Patient Active Problem List   Diagnosis Date Noted   Osteomyelitis of second toe of right foot (HCC)    Ulcer of right foot, limited to breakdown of skin (HCC) 02/21/2020   Diarrhea    Hyperthyroidism    Insulin dependent  diabetes mellitus    Orthostasis    Vaginal discharge    Infectious diarrhea 11/26/2018   Hyperkalemia 11/26/2018   Bacteremia 06/21/2018   Sepsis secondary to UTI (HCC) 06/20/2018   Right great toe amputee (HCC) 08/05/2017   Mild renal insufficiency 07/19/2017   AKI (acute kidney injury)  (HCC) 03/04/2016   Essential hypertension 05/14/2015   CAD- with distal LAD disease, ? 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 X 7 days, Disp: 10 mL, Rfl: 0   amLODipine (NORVASC) 10 MG tablet, TAKE 1 TABLET (10 MG TOTAL) BY MOUTH DAILY., Disp: 90 tablet, Rfl: 3   aspirin 81 MG EC tablet, Take 1 tablet (81 mg total) by mouth daily., Disp: 90 tablet, Rfl: 1   atorvastatin (LIPITOR) 40 MG tablet, TAKE 1 TABLET (40 MG TOTAL) BY MOUTH AT BEDTIME., Disp: 90 tablet, Rfl: 3   ciclopirox (PENLAC) 8 % solution, Apply topically at bedtime. Apply over nail and surrounding skin. Apply daily over previous coat. After seven (7) days, may remove with alcohol and continue cycle., Disp: 6.6 mL, Rfl: 2   Dulaglutide (TRULICITY) 3 MG/0.5ML SOPN, Inject 3 mg as directed once a week., Disp: 3 mL, Rfl: 6   gabapentin (NEURONTIN) 400 MG capsule, TAKE 1 CAPSULE (400 MG TOTAL) BY MOUTH 4 (FOUR) TIMES DAILY., Disp: 360 capsule, Rfl: 1   insulin aspart (NOVOLOG FLEXPEN) 100 UNIT/ML FlexPen, Inject 10 units into the skin 2 times daily before lunch and supper, Disp: 15 mL, Rfl: 3   Insulin Glargine (BASAGLAR KWIKPEN) 100 UNIT/ML, Inject 65 Units into the skin daily., Disp: 18 mL,  Rfl: 2   Insulin Pen Needle 31G X 8 MM MISC, Inject 1 each into the skin at bedtime., Disp: 90 each, Rfl: 1   losartan (COZAAR) 100 MG tablet, TAKE 1 TABLET (100 MG TOTAL) BY MOUTH DAILY., Disp: 90 tablet, Rfl: 1   metFORMIN (GLUCOPHAGE-XR) 500 MG 24 hr tablet, TAKE 1 TABLET (500 MG TOTAL) BY MOUTH 2 (TWO) TIMES DAILY AFTER A MEAL., Disp: 180 tablet, Rfl: 2   methimazole (TAPAZOLE) 5 MG tablet, TAKE 1 TABLET (5 MG TOTAL) BY MOUTH EVERY MONDAY, WEDNESDAY, AND FRIDAY., Disp: 90 tablet, Rfl: 1  Observations/Objective: Patient is well-developed, well-nourished in no acute distress.  Resting comfortably at home.  Head is normocephalic, atraumatic.  No labored breathing.  Speech is clear and coherent with logical content.  Patient is alert and oriented at baseline.    Assessment and Plan: 1. Non-recurrent acute suppurative otitis media of left ear without spontaneous rupture of tympanic membrane - amoxicillin (AMOXIL) 500 MG capsule; Take 1 capsule (500 mg total) by mouth 2 (two) times daily for 10 days.  Dispense: 20 capsule; Refill: 0 - neomycin-polymyxin-hydrocortisone (CORTISPORIN) OTIC solution; Place 3  drops into the left ear 4 (four) times daily. X 7 days  Dispense: 10 mL; Refill: 0  2. Dizziness - meclizine (ANTIVERT) 25 MG tablet; Take 0.5-1 tablets (12.5-25 mg total) by mouth 3 (three) times daily as needed for dizziness.  Dispense: 30 tablet; Refill: 0  - Suspect otitis media of left ear - Will treat with amoxicillin, Cortisporin, and meclizine - May use tylenol as needed for fevers and pain - Push fluids - Monitor sugars and blood pressure - Seek in person evaluation if not improving or symptoms worsen  Follow Up Instructions: I discussed the assessment and treatment plan with the patient. The patient was provided an opportunity to ask questions and all were answered. The patient agreed with the plan and demonstrated an understanding of the instructions.  A copy of instructions  were sent to the patient via MyChart unless otherwise noted below.    The patient was advised to call back or seek an in-person evaluation if the symptoms worsen or if the condition fails to improve as anticipated.  Time:  I spent 18 minutes with the patient via telehealth technology discussing the above problems/concerns.    Rebecca Loveless, PA-C

## 2022-05-07 ENCOUNTER — Other Ambulatory Visit (INDEPENDENT_AMBULATORY_CARE_PROVIDER_SITE_OTHER): Payer: Self-pay | Admitting: Primary Care

## 2022-05-07 ENCOUNTER — Other Ambulatory Visit: Payer: Self-pay

## 2022-05-07 DIAGNOSIS — E7841 Elevated Lipoprotein(a): Secondary | ICD-10-CM

## 2022-05-07 DIAGNOSIS — E119 Type 2 diabetes mellitus without complications: Secondary | ICD-10-CM

## 2022-05-07 DIAGNOSIS — E1165 Type 2 diabetes mellitus with hyperglycemia: Secondary | ICD-10-CM

## 2022-05-07 DIAGNOSIS — I10 Essential (primary) hypertension: Secondary | ICD-10-CM

## 2022-05-13 ENCOUNTER — Ambulatory Visit (INDEPENDENT_AMBULATORY_CARE_PROVIDER_SITE_OTHER): Payer: Self-pay | Admitting: Primary Care

## 2022-05-13 ENCOUNTER — Other Ambulatory Visit: Payer: Self-pay

## 2022-05-13 ENCOUNTER — Encounter (INDEPENDENT_AMBULATORY_CARE_PROVIDER_SITE_OTHER): Payer: Self-pay | Admitting: Primary Care

## 2022-05-13 VITALS — BP 138/86 | HR 93 | Temp 97.8°F | Ht 62.0 in | Wt 179.4 lb

## 2022-05-13 DIAGNOSIS — E782 Mixed hyperlipidemia: Secondary | ICD-10-CM

## 2022-05-13 DIAGNOSIS — E1165 Type 2 diabetes mellitus with hyperglycemia: Secondary | ICD-10-CM

## 2022-05-13 DIAGNOSIS — Z794 Long term (current) use of insulin: Secondary | ICD-10-CM

## 2022-05-13 DIAGNOSIS — Z23 Encounter for immunization: Secondary | ICD-10-CM

## 2022-05-13 DIAGNOSIS — Z760 Encounter for issue of repeat prescription: Secondary | ICD-10-CM

## 2022-05-13 DIAGNOSIS — E7841 Elevated Lipoprotein(a): Secondary | ICD-10-CM

## 2022-05-13 DIAGNOSIS — I10 Essential (primary) hypertension: Secondary | ICD-10-CM

## 2022-05-13 DIAGNOSIS — E0590 Thyrotoxicosis, unspecified without thyrotoxic crisis or storm: Secondary | ICD-10-CM

## 2022-05-13 DIAGNOSIS — E119 Type 2 diabetes mellitus without complications: Secondary | ICD-10-CM

## 2022-05-13 LAB — POCT GLYCOSYLATED HEMOGLOBIN (HGB A1C): Hemoglobin A1C: 10.1 % — AB (ref 4.0–5.6)

## 2022-05-13 MED ORDER — ATORVASTATIN CALCIUM 40 MG PO TABS
40.0000 mg | ORAL_TABLET | Freq: Every day | ORAL | 1 refills | Status: DC
  Filled 2022-05-13: qty 90, 90d supply, fill #0
  Filled 2022-08-21: qty 30, 30d supply, fill #1
  Filled 2022-09-23: qty 30, 30d supply, fill #2
  Filled 2022-10-27 (×2): qty 30, 30d supply, fill #3

## 2022-05-13 MED ORDER — TRULICITY 3 MG/0.5ML ~~LOC~~ SOAJ
3.0000 mg | SUBCUTANEOUS | 6 refills | Status: DC
  Filled 2022-05-13: qty 3, 42d supply, fill #0

## 2022-05-13 MED ORDER — AMLODIPINE BESYLATE 10 MG PO TABS
ORAL_TABLET | Freq: Every day | ORAL | 3 refills | Status: DC
  Filled 2022-05-13: qty 30, 30d supply, fill #0

## 2022-05-13 MED ORDER — NOVOLOG FLEXPEN 100 UNIT/ML ~~LOC~~ SOPN
PEN_INJECTOR | SUBCUTANEOUS | 3 refills | Status: DC
  Filled 2022-05-13 (×2): qty 15, 75d supply, fill #0
  Filled 2022-08-21: qty 18, 84d supply, fill #1
  Filled 2022-12-30: qty 18, 84d supply, fill #2

## 2022-05-13 MED ORDER — METFORMIN HCL ER 500 MG PO TB24
ORAL_TABLET | ORAL | 2 refills | Status: DC
  Filled 2022-05-13: qty 180, fill #0

## 2022-05-13 MED ORDER — AMLODIPINE BESYLATE 10 MG PO TABS
10.0000 mg | ORAL_TABLET | Freq: Every day | ORAL | 1 refills | Status: DC
  Filled 2022-05-13: qty 90, 90d supply, fill #0
  Filled 2022-10-27: qty 90, 90d supply, fill #1
  Filled 2022-10-27: qty 30, 30d supply, fill #1
  Filled 2023-01-07 (×2): qty 30, 30d supply, fill #2

## 2022-05-13 MED ORDER — ATORVASTATIN CALCIUM 40 MG PO TABS
ORAL_TABLET | Freq: Every day | ORAL | 3 refills | Status: DC
  Filled 2022-05-13: qty 90, 90d supply, fill #0

## 2022-05-13 MED ORDER — GABAPENTIN 400 MG PO CAPS
ORAL_CAPSULE | ORAL | 1 refills | Status: DC
  Filled 2022-05-13: qty 360, fill #0
  Filled 2022-06-09: qty 360, 90d supply, fill #0
  Filled 2022-09-23: qty 360, 90d supply, fill #1

## 2022-05-13 MED ORDER — METFORMIN HCL ER 500 MG PO TB24
ORAL_TABLET | ORAL | 2 refills | Status: DC
  Filled 2022-05-13: qty 180, 90d supply, fill #0
  Filled 2022-10-27: qty 180, 90d supply, fill #1
  Filled 2022-10-27: qty 60, 30d supply, fill #1
  Filled 2023-01-07: qty 60, 30d supply, fill #2

## 2022-05-13 MED ORDER — BASAGLAR KWIKPEN 100 UNIT/ML ~~LOC~~ SOPN
65.0000 [IU] | PEN_INJECTOR | Freq: Every day | SUBCUTANEOUS | 2 refills | Status: DC
  Filled 2022-05-13 – 2022-10-27 (×2): qty 18, 27d supply, fill #0
  Filled 2022-12-30: qty 18, 27d supply, fill #1

## 2022-05-13 MED ORDER — METHIMAZOLE 5 MG PO TABS
ORAL_TABLET | ORAL | 1 refills | Status: DC
  Filled 2022-05-13: qty 90, fill #0
  Filled 2022-06-19: qty 12, 28d supply, fill #0
  Filled 2022-07-16: qty 12, 28d supply, fill #1
  Filled 2022-08-21: qty 12, 28d supply, fill #2
  Filled 2022-09-23: qty 12, 28d supply, fill #3
  Filled 2022-10-27 (×2): qty 12, 28d supply, fill #4
  Filled 2023-01-07: qty 12, 28d supply, fill #5

## 2022-05-13 MED ORDER — LOSARTAN POTASSIUM 100 MG PO TABS
ORAL_TABLET | Freq: Every day | ORAL | 1 refills | Status: DC
  Filled 2022-05-13: qty 90, fill #0
  Filled 2022-06-19: qty 90, 90d supply, fill #0
  Filled 2022-10-27: qty 30, 30d supply, fill #1
  Filled 2022-10-27: qty 90, 90d supply, fill #1
  Filled 2023-01-07: qty 30, 30d supply, fill #2

## 2022-05-13 NOTE — Patient Instructions (Signed)
Gripe en los adultos Influenza, Adult A la gripe tambin se la conoce como "influenza". Es una infeccin en los pulmones, la nariz y la garganta (vas respiratorias). Se transmite fcilmente de persona a persona (es contagiosa). La gripe causa sntomas que son como los de un resfro, junto con fiebre alta y dolores corporales. Cules son las causas? La causa de esta afeccin es el virus de la influenza. Puede contraer el virus de las siguientes maneras: Al inhalar gotitas que quedan en el aire despus de que una persona infectada con gripe tosi o estornud. Al tocar algo que est contaminado con el virus y luego llevarse la mano a la boca, la nariz o los ojos. Qu incrementa el riesgo? Hay ciertas cosas que lo pueden hacer ms propenso a tener gripe. Estas incluyen lo siguiente: No lavarse las manos con frecuencia. Tener contacto cercano con muchas personas durante la temporada de resfro y gripe. Tocarse la boca, los ojos o la nariz sin antes lavarse las manos. No recibir la vacuna antigripal todos los aos. Puede correr un mayor riesgo de tener gripe, y problemas graves, como una infeccin pulmonar (neumona), si usted: Es mayor de 65 aos de edad. Est embarazada. Tiene debilitado el sistema que combate las defensas (sistema inmunitario) debido a una enfermedad o a que toma determinados medicamentos. Tiene una afeccin a largo plazo (crnica), como las siguientes: Enfermedad cardaca, renal o pulmonar. Diabetes. Asma. Tiene un trastorno heptico. Tiene mucho sobrepeso (obesidad mrbida). Tiene anemia. Cules son los signos o sntomas? Los sntomas normalmente comienzan de repente y duran entre 4 y 14 das. Pueden incluir los siguientes: Fiebre y escalofros. Dolores de cabeza, dolores en el cuerpo o dolores musculares. Dolor de garganta. Tos. Secrecin o congestin nasal. Molestias en el pecho. No querer comer tanto como lo hace normalmente. Sensacin de debilidad o  cansancio. Mareos. Malestar estomacal o vmitos. Cmo se trata? Si la gripe se detecta de forma temprana, puede recibir tratamiento con medicamentos antivirales. Esto puede ayudar a reducir la gravedad y la duracin de la enfermedad. Se los administrarn por boca o a travs de un tubo (catter) intravenoso. Cuidarse en su hogar puede ayudar a que mejoren los sntomas. El mdico puede recomendarle lo siguiente: Tomar medicamentos de venta libre. Beber abundante lquido. La gripe suele desaparecer sola. Si tiene sntomas muy graves u otros problemas, puede recibir tratamiento en un hospital. Siga estas instrucciones en su casa:     Actividad Descanse todo lo que sea necesario. Duerma lo suficiente. Qudese en su casa y no concurra al trabajo o a la escuela, como se lo haya indicado el mdico. No salga de su casa hasta que no haya tenido fiebre por 24 horas sin tomar medicamentos. Salga de su casa solamente para ir al mdico. Comida y bebida Tome una SRO (solucin de rehidratacin oral). Es una bebida que se vende en farmacias y tiendas. Beba suficiente lquido como para mantener la orina de color amarillo plido. En la medida en que pueda, beba lquidos transparentes en pequeas cantidades. Los lquidos transparentes son, por ejemplo: Agua. Trocitos de hielo. Jugo de frutas mezclado con agua. Bebidas deportivas de bajas caloras. Coma alimentos suaves que sean fciles de digerir. En la medida que pueda, consuma pequeas cantidades. Estos alimentos incluyen: Bananas. Pur de manzana. Arroz. Carnes magras. Tostadas. Galletas. No coma ni beba lo siguiente: Lquidos con alto contenido de azcar o cafena. Alcohol. Alimentos condimentados o con alto contenido de grasa. Indicaciones generales Use los medicamentos de venta libre y los   recetados solamente como se lo haya indicado el mdico. Use un humidificador de aire fro para que el aire de su casa est ms hmedo. Esto puede  facilitar la respiracin. Cuando utilice un humidificador de vapor fro, lmpielo a diario. Vace el agua y [place] por agua limpia. Al toser o estornudar, cbrase la boca y la [place]. Lvese las manos frecuentemente con agua y [place] y durante al menos 20 segundos. Esto tambin es importante despus de toser o Engineering geologist. Si no dispone de [place] y [place], use desinfectante para manos con alcohol. Cumpla con todas las visitas de seguimiento. Cmo se previene?  Colquese la vacuna antigripal todos los [place]. Puede colocarse la vacuna contra la gripe a fines de verano, en otoo o en invierno. Pregntele al mdico cundo debe aplicarse la vacuna contra la gripe. Evite el contacto con personas que estn enfermas durante el otoo y el invierno. Es la temporada del resfro y Emergency planning/management officer. Comunquese con un mdico si: Tiene sntomas nuevos. Tiene los siguientes sntomas: Dolor de [place]. Materia fecal lquida (diarrea). Grant Ruts. La tos empeora. Empieza a tener ms mucosidad. Tiene Programme researcher, broadcasting/film/video. Vomita. Solicite ayuda de inmediato si: Le falta el aire. Tiene dificultad para respirar. La piel o las uas se ponen de un color azulado. Presenta dolor muy intenso o rigidez en el cuello. Tiene dolor de cabeza repentino. Le duele la cara o el odo de forma repentina. No puede comer ni beber sin vomitar. Estos sntomas pueden representar un problema grave que constituye Radio broadcast assistant. Solicite atencin mdica de inmediato. Comunquese con el servicio de emergencias de su localidad (911 en los [place]). No espere a ver si los sntomas desaparecen. No conduzca por sus propios medios [employer]. Resumen A la gripe tambin se la conoce como "influenza". Es una [employer] , la nariz y Administrator. Se transmite fcilmente de [place] persona a otra. Use los medicamentos de venta libre y los recetados solamente como se lo haya indicado el mdico. Aplicarse la vacuna contra la gripe  todos los aos es la mejor manera de no contagiarse la gripe. Esta informacin no tiene Theme park manager el consejo del mdico. Asegrese de hacerle al mdico cualquier pregunta que tenga. Document Revised: 07/04/2020 Document Reviewed: 07/04/2020 Elsevier Patient Education  2023 Elsevier Inc. [place] Tdap (ttanos, difteria y tos [place]): lo que debe saber Tdap (Tetanus, Diphtheria, Pertussis) Vaccine: What You Need to Know 1. Por qu vacunarse? La vacuna Tdap puede prevenir el ttanos, la difteria y la tos [place]. La difteria y la tos Benetta Spar se [place] de persona a Social worker. El ttanos ingresa al organismo a travs de cortes o heridas. [place] (T) provoca rigidez dolorosa en los msculos. El ttanos puede causar graves problemas de [place], como no poder abrir la boca, Warehouse manager dificultad para tragar y Industrial/product designer, o la muerte. La DIFTERIA (D) puede causar dificultad para respirar, insuficiencia cardaca, parlisis o muerte. La TOS FERINA (aP), tambin conocida como "tos convulsa", puede causar tos violenta e incontrolable lo que hace difcil respirar, comer o beber. La tos ferina puede ser muy grave, especialmente en los bebs y en los nios pequeos, y causar neumona, convulsiones, dao cerebral o la [place]. En adolescentes y [address], puede causar prdida de [place], prdida del control de la vejiga, [place] y fracturas de Forensic psychologist al toser de Wellsite geologist intensa. 2. Madilyn Fireman Tdap La vacuna Tdap es solo para nios de 7 aos en adelante, adolescentes y [place].  Los adolescentes deben recibir una dosis nica de  la vacuna Tdap, preferentemente a los 11 o 12 aos. Las personas embarazadas deben recibir una dosis de la vacuna Tdap durante cada embarazo, preferiblemente durante la primera parte del tercer trimestre, para ayudar a proteger al recin nacido de la tos [place]. Los bebs tienen mayor riesgo de sufrir complicaciones graves y potencialmente mortales debido a la tos [place]. Los adultos que nunca  recibieron la vacuna Tdap deben recibir una dosis. Adems, los adultos deben recibir una dosis de refuerzo de Tdap o Td (una vacuna diferente que protege contra el ttanos y la difteria, pero no contra la tos [place]) cada 10 aos, o despus de 5 aos en caso de herida o [place] grave o sucia. La vacuna Tdap puede ser administrada al mismo tiempo que otras vacunas. 3. Hable con el mdico Comunquese con la persona que le coloca las vacunas si la persona que la recibe: Ha tenido una reaccin alrgica despus de Neomia Dear dosis anterior de cualquier vacuna contra el ttanos, la difteria o la tos [place], o cualquier Programmer, multimedia grave, potencialmente mortal Ha tenido un coma, disminucin del nivel de la conciencia o convulsiones prolongadas dentro de los [address] posteriores a una dosis anterior de cualquier vacuna contra la tos Psychologist, educational (DTP, DTaP o Tdap) Tiene convulsiones u otro problema del sistema nervioso Alguna vez tuvo sndrome de Pension scheme manager (tambin llamado "SGB") Ha tenido dolor intenso o hinchazn despus de una dosis anterior de cualquier vacuna contra el ttanos o la difteria En algunos casos, es posible que el mdico decida posponer la aplicacin de la vacuna Tdap para una visita en el futuro. Las personas que sufren trastornos menores, como un resfro, pueden vacunarse. Las personas que tienen enfermedades moderadas o graves generalmente deben esperar hasta recuperarse para poder recibir la vacuna Tdap.  Su mdico puede darle ms informacin. 4. Riesgos de [place] reaccin a la vacuna Despus de recibir la vacuna Tdap a veces se puede Surveyor, mining, enrojecimiento o Paramedic donde se aplic la inyeccin, fiebre leve, dolor de cabeza, sensacin de cansancio y nuseas, vmitos, diarrea o dolor de [place]. Las personas a veces se desmayan despus de procedimientos mdicos, incluida la vacunacin. Informe al mdico si se siente mareado, tiene cambios en la visin o zumbidos en los odos.  Al  igual que con cualquier Automatic Data, existe una probabilidad muy remota de que una vacuna cause una reaccin alrgica grave, otra lesin grave o la muerte. 5. Qu pasa si se presenta un problema grave? Podra producirse una reaccin alrgica despus de que la persona vacunada abandone la clnica. Si observa signos de Runner, broadcasting/film/video grave (ronchas, hinchazn de la cara y la garganta, dificultad para respirar, latidos cardacos acelerados, mareos o debilidad), llame al 9-1-1 y lleve a la persona al hospital ms cercano. Si se presentan otros signos que le preocupan, comunquese con su mdico.  Las reacciones adversas deben informarse al Sistema de Informe de Eventos Adversos de Administrator, arts (VAERS). Por lo general, el mdico presenta este informe o puede hacerlo usted mismo. Visite el sitio web del VAERS en www.vaers.LAgents.no o llame al 475-356-5660. El VAERS es solo para Biomedical engineer, y los miembros de su personal no proporcionan asesoramiento mdico. 6. Programa Nacional de Compensacin de Daos por [employer] El SunTrust de Compensacin de Daos por Administrator, arts (National Vaccine Injury Kohl[employer], Cabin crew) es un programa federal que fue creado para Patent examiner a las personas que puedan haber sufrido daos al recibir ciertas vacunas. Las reclamaciones relativas a presuntas lesiones o muerte debidas a  la vacunacin tienen un lmite de tiempo para su presentacin, que puede ser de tan solo [employer]. Visite el sitio web del VICP en SpiritualWord.at o llame al 1-(317) 336-6044 para obtener ms informacin acerca del programa y de cmo presentar un reclamo. 7. Cmo puedo obtener ms informacin? Pregntele a su mdico. Comunquese con el servicio de salud de su localidad o su estado. Visite el sitio web de Tax inspector) (Administracin de Alimentos y Media planner) para ver los prospectos de las vacunas e informacin adicional en  FinderList.no. Comunquese con Building control surveyor for [employer]) (Centros para el Control y la Prevencin de [place]): Llame al [phone] (1-800-CDC-INFO) o Visite el sitio web de [employer]EL Corporation en PicCapture.uy. Fuente: Dietitian de informacin de los CDC sobre la vacuna Tdap (ttanos, difteria y [place]) (04/26/2020) Este mismo material est disponible en FootballExhibition.com.br sin cargo. Esta informacin no tiene Theme park manager el consejo del mdico. Asegrese de hacerle al mdico cualquier pregunta que tenga. Document Revised: 08/26/2021 Document Reviewed: 06/09/2021 Elsevier Patient Education  2023 [employer].

## 2022-05-13 NOTE — Progress Notes (Signed)
Subjective:  Patient ID: Rebecca [name], female    DOB: 1965/12/16  Age: 56 y.o. MRN: [id number]  CC: Diabetes   HPI Rebecca [name] is a 56 year old Hispanic obese female ( interpreter Vladimir Crofts [phone]) who presents for follow-up of diabetes. Patient does not check blood sugar at home. Also, management of HTN- Patient has No headache, No chest pain, No abdominal pain - No Nausea, No new weakness tingling or numbness, No Cough - shortness of breath . She does voice concerns of back pain. Asked was she still caring for her grand children - no they are bigger now and they are taking care of me  ie putting on my socks and shoes- picking things up for her.  Compliant with meds - Yes (problems with getting through the office) she has had problems with medication refills Checking CBGs? Yes  Fasting avg -   Postprandial average -  Exercising regularly? - Yes/walking Watching carbohydrate intake? - Yes Neuropathy ? - yes Hypoglycemic events - No  - Recovers with :   Pertinent ROS:  Polyuria - No Polydipsia - No Vision problems - No  Medications as noted below. Taking them regularly without complication/adverse reaction being reported today.   History Rebecca [name] has a past medical history of Anemia, Diabetes mellitus, Dyslipidemia, Hypertension, Non-STEMI (non-ST elevated myocardial infarction) (HCC), and Thyroid disease.   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   Her family history includes Cancer in her mother; Diabetes in her brother, brother, brother, brother, brother, sister, sister, sister, and sister; Hyperlipidemia in her father; Hypertension in her father.She reports that she has never smoked. She has never used smokeless tobacco. She reports that she does not drink alcohol and does not use drugs.  Current Outpatient Medications on File Prior to  Visit  Medication Sig Dispense Refill   aspirin 81 MG EC tablet Take 1 tablet (81 mg total) by mouth daily. 90 tablet 1   ciclopirox (PENLAC) 8 % solution Apply topically at bedtime. Apply over nail and surrounding skin. Apply daily over previous coat. After seven (7) days, may remove with alcohol and continue cycle. 6.6 mL 2   Insulin Pen Needle 31G X 8 MM MISC Inject 1 each into the skin at bedtime. 90 each 1   meclizine (ANTIVERT) 25 MG tablet Take 0.5-1 tablets (12.5-25 mg total) by mouth 3 (three) times daily as needed for dizziness. (Patient not taking: Reported on 05/13/2022) 30 tablet 0   neomycin-polymyxin-hydrocortisone (CORTISPORIN) OTIC solution Place 3 drops into the left ear 4 (four) times daily. X 7 days 10 mL 0   No current facility-administered medications on file prior to visit.    ROS Comprehensive ROS Pertinent positive and negative noted in HPI    Objective:  BP 138/86   Pulse 93   Temp 97.8 F (36.6 C) (Oral)   Ht 5\' 2"  (1.575 m)   Wt 179 lb 6.4 oz (81.4 kg)   LMP  (LMP Unknown)   SpO2 98%   BMI 32.81 kg/m   BP Readings from Last 3 Encounters:  05/13/22 138/86  02/09/22 136/83  11/11/21 (!) 148/88    Wt Readings from Last 3 Encounters:  05/13/22 179 lb 6.4 oz (81.4 kg)  02/09/22 179 lb 12.8 oz (81.6 kg)  11/11/21 179 lb 9.6 oz (81.5 kg)    Physical Exam General: No apparent distress.  Obese female Eyes: Extraocular eye movements intact, pupils  equal and round. Neck: Supple, trachea midline. Thyroid: No enlargement, mobile without fixation, no tenderness. Cardiovascular: Regular rhythm and rate, no murmur, normal radial pulses. Respiratory: Normal respiratory effort, clear to auscultation. Gastrointestinal: Normal pitch active bowel sounds, nontender abdomen without distention Musculoskeletal: Normal muscle tone, no tenderness on palpation of tibia, no excessive thoracic kyphosis. Skin: Appropriate warmth, no visible rash. Mental status: Alert,  conversant, speech clear, thought logical, appropriate mood and affect, no hallucinations or delusions evident. Hematologic/lymphatic: No cervical adenopathy, no visible ecchymoses.  Lab Results  Component Value Date   HGBA1C 10.1 (A) 05/13/2022   HGBA1C 8.7 (A) 02/09/2022   HGBA1C 8.2 (A) 11/11/2021    Lab Results  Component Value Date   WBC 8.9 05/13/2022   HGB 11.9 05/13/2022   HCT 36.5 05/13/2022   PLT 339 05/13/2022   GLUCOSE 79 05/13/2022   CHOL 184 05/13/2022   TRIG 121 05/13/2022   HDL 42 05/13/2022   LDLCALC 120 (H) 05/13/2022   ALT 25 05/13/2022   AST 24 05/13/2022   NA 140 05/13/2022   K 4.8 05/13/2022   CL 101 05/13/2022   CREATININE 1.47 (H) 05/13/2022   BUN 27 (H) 05/13/2022   CO2 21 05/13/2022   TSH 1.920 05/13/2022   INR 1.08 05/13/2015   HGBA1C 10.1 (A) 05/13/2022   MICROALBUR 0.50 06/04/2009     Assessment & Plan:  Rebecca [name] was seen today for diabetes.  Diagnoses and all orders for this visit:  Type 2 diabetes mellitus without complication, with long-term current use of insulin (HCC) -     HgB A1c -     Discontinue: metFORMIN (GLUCOPHAGE-XR) 500 MG 24 hr tablet; TAKE 1 TABLET (500 MG TOTAL) BY MOUTH 2 (TWO) TIMES DAILY AFTER A MEAL. -     metFORMIN (GLUCOPHAGE-XR) 500 MG 24 hr tablet; TAKE 1 TABLET (500 MG TOTAL) BY MOUTH 2 (TWO) TIMES DAILY AFTER A MEAL.  Need for Tdap vaccination -     Tdap vaccine greater than or equal to 7yo IM  Essential hypertension Counseled on blood pressure goal of less than 130/80, blood pressure has been stable low-sodium, DASH diet, medication compliance, 150 minutes of moderate intensity exercise per week. Discussed medication compliance, adverse effects.  -     Comprehensive metabolic panel  Hyperthyroidism -     TSH -     T4, free -     methimazole (TAPAZOLE) 5 MG tablet; TAKE 1 TABLET (5 MG TOTAL) BY MOUTH EVERY MONDAY, WEDNESDAY, AND FRIDAY.  Mixed hyperlipidemia Continue atorvastatin 40 mg at bedtime and  include a healthy lifestyle diet of fruits vegetables fish nuts whole grains and low saturated fat . Foods high in cholesterol or liver, fatty meats,cheese, butter avocados, nuts and seeds, chocolate and fried foods. -     Lipid panel  Uncontrolled type 2 diabetes mellitus with hyperglycemia (HCC) A1c has gone from 8.7 to 10 patient states she has been taking medication as prescribed adjustments made at increase metformin to 500 MGs XR twice daily Discussed  co- morbidities with uncontrol diabetes  Complications -diabetic retinopathy, (close your eyes ? What do you see nothing) nephropathy decrease in kidney function- can lead to dialysis-on a machine 3 days a week to filter your kidney, neuropathy- numbness and tinging in your hands and feet,  increase risk of heart attack and stroke, and amputation due to decrease wound healing and circulation. Decrease your risk by taking medication daily as prescribed, monitor carbohydrates- foods that are high in carbohydrates are  the following rice, potatoes, breads, sugars, and pastas.  Reduction in the intake (eating) will assist in lowering your blood sugars. Exercise daily at least 30 minutes daily.  -      Need for immunization against influenza -     Flu Vaccine QUAD 58mo+IM (Fluarix, Fluzone & Alfiuria Quad PF)  Other ordersMedication refill -     Dulaglutide (TRULICITY) 3 MG/0.5ML SOPN; Inject 3 mg as directed once a week. -     insulin aspart (NOVOLOG FLEXPEN) 100 UNIT/ML FlexPen; Inject 10 units into the skin 2 times daily before lunch and supper -     atorvastatin (LIPITOR) 40 MG tablet; Take 1 tablet (40 mg total) by mouth daily. -     amLODipine (NORVASC) 10 MG tablet; Take 1 tablet (10 mg total) by mouth daily.     I have discontinued Tailor I. Buckbee's atorvastatin. I have also changed her atorvastatin. Additionally, I am having her maintain her aspirin EC, Insulin Pen Needle, ciclopirox, neomycin-polymyxin-hydrocortisone, meclizine,  Trulicity, Basaglar KwikPen, metFORMIN, losartan, NovoLOG FlexPen, gabapentin, amLODipine, and methimazole.  Meds ordered this encounter  Medications   Dulaglutide (TRULICITY) 3 MG/0.5ML SOPN    Sig: Inject 3 mg as directed once a week.    Dispense:  3 mL    Refill:  6    Order Specific Question:   Supervising Provider    Answer:   Quentin Angst [[id number]]   DISCONTD: metFORMIN (GLUCOPHAGE-XR) 500 MG 24 hr tablet    Sig: TAKE 1 TABLET (500 MG TOTAL) BY MOUTH 2 (TWO) TIMES DAILY AFTER A MEAL.    Dispense:  180 tablet    Refill:  2    Order Specific Question:   Supervising Provider    Answer:   Quentin Angst [[id number]]   Insulin Glargine (BASAGLAR KWIKPEN) 100 UNIT/ML    Sig: Inject 65 Units into the skin daily.    Dispense:  18 mL    Refill:  2    Order Specific Question:   Supervising Provider    Answer:   Quentin Angst [[id number]]   DISCONTD: amLODipine (NORVASC) 10 MG tablet    Sig: TAKE 1 TABLET (10 MG TOTAL) BY MOUTH DAILY.    Dispense:  90 tablet    Refill:  3    Order Specific Question:   Supervising Provider    Answer:   Quentin Angst [[id number]]   DISCONTD: atorvastatin (LIPITOR) 40 MG tablet    Sig: TAKE 1 TABLET (40 MG TOTAL) BY MOUTH AT BEDTIME.    Dispense:  90 tablet    Refill:  3    Order Specific Question:   Supervising Provider    Answer:   Quentin Angst [[id number]]   metFORMIN (GLUCOPHAGE-XR) 500 MG 24 hr tablet    Sig: TAKE 1 TABLET (500 MG TOTAL) BY MOUTH 2 (TWO) TIMES DAILY AFTER A MEAL.    Dispense:  180 tablet    Refill:  2    Order Specific Question:   Supervising Provider    Answer:   Quentin Angst [[id number]]   losartan (COZAAR) 100 MG tablet    Sig: TAKE 1 TABLET (100 MG TOTAL) BY MOUTH DAILY.    Dispense:  90 tablet    Refill:  1    Order Specific Question:   Supervising Provider    Answer:   Quentin Angst [[id number]]   insulin aspart (NOVOLOG FLEXPEN) 100 UNIT/ML FlexPen    Sig: Inject 10 units into the  skin  2 times daily before lunch and supper    Dispense:  15 mL    Refill:  3    Order Specific Question:   Supervising Provider    Answer:   Quentin Angst [[id number]]   gabapentin (NEURONTIN) 400 MG capsule    Sig: TAKE 1 CAPSULE (400 MG TOTAL) BY MOUTH 4 (FOUR) TIMES DAILY.    Dispense:  360 capsule    Refill:  1    Order Specific Question:   Supervising Provider    Answer:   Quentin Angst [[id number]]   atorvastatin (LIPITOR) 40 MG tablet    Sig: Take 1 tablet (40 mg total) by mouth daily.    Dispense:  90 tablet    Refill:  1    Order Specific Question:   Supervising Provider    Answer:   Quentin Angst [[id number]]   amLODipine (NORVASC) 10 MG tablet    Sig: Take 1 tablet (10 mg total) by mouth daily.    Dispense:  90 tablet    Refill:  1    Order Specific Question:   Supervising Provider    Answer:   Quentin Angst [[id number]]   methimazole (TAPAZOLE) 5 MG tablet    Sig: TAKE 1 TABLET (5 MG TOTAL) BY MOUTH EVERY MONDAY, WEDNESDAY, AND FRIDAY.    Dispense:  90 tablet    Refill:  1    Order Specific Question:   Supervising Provider    Answer:   Quentin Angst [id number]     Follow-up:   Return in about 3 months (around 08/13/2022) for DM.  The above assessment and management plan was discussed with the patient. The patient verbalized understanding of and has agreed to the management plan. Patient is aware to call the clinic if symptoms fail to improve or worsen. Patient is aware when to return to the clinic for a follow-up visit. Patient educated on when it is appropriate to go to the emergency department.   Gwinda Passe, NP-C

## 2022-05-14 LAB — COMPREHENSIVE METABOLIC PANEL
ALT: 25 IU/L (ref 0–32)
AST: 24 IU/L (ref 0–40)
Albumin/Globulin Ratio: 1.4 (ref 1.2–2.2)
Albumin: 4.4 g/dL (ref 3.8–4.9)
Alkaline Phosphatase: 151 IU/L — ABNORMAL HIGH (ref 44–121)
BUN/Creatinine Ratio: 18 (ref 9–23)
BUN: 27 mg/dL — ABNORMAL HIGH (ref 6–24)
Bilirubin Total: 0.5 mg/dL (ref 0.0–1.2)
CO2: 21 mmol/L (ref 20–29)
Calcium: 9.9 mg/dL (ref 8.7–10.2)
Chloride: 101 mmol/L (ref 96–106)
Creatinine, Ser: 1.47 mg/dL — ABNORMAL HIGH (ref 0.57–1.00)
Globulin, Total: 3.1 g/dL (ref 1.5–4.5)
Glucose: 79 mg/dL (ref 70–99)
Potassium: 4.8 mmol/L (ref 3.5–5.2)
Sodium: 140 mmol/L (ref 134–144)
Total Protein: 7.5 g/dL (ref 6.0–8.5)
eGFR: 42 mL/min/{1.73_m2} — ABNORMAL LOW (ref >59)

## 2022-05-14 LAB — T4, FREE: Free T4: 1.32 ng/dL (ref 0.82–1.77)

## 2022-05-14 LAB — CBC WITH DIFFERENTIAL/PLATELET
Basophils Absolute: 0.1 10*3/uL (ref 0.0–0.2)
Basos: 1 %
EOS (ABSOLUTE): 0.3 10*3/uL (ref 0.0–0.4)
Eos: 3 %
Hematocrit: 36.5 % (ref 34.0–46.6)
Hemoglobin: 11.9 g/dL (ref 11.1–15.9)
Immature Grans (Abs): 0 10*3/uL (ref 0.0–0.1)
Immature Granulocytes: 0 %
Lymphocytes Absolute: 2.5 10*3/uL (ref 0.7–3.1)
Lymphs: 28 %
MCH: 27.4 pg (ref 26.6–33.0)
MCHC: 32.6 g/dL (ref 31.5–35.7)
MCV: 84 fL (ref 79–97)
Monocytes Absolute: 0.6 10*3/uL (ref 0.1–0.9)
Monocytes: 7 %
Neutrophils Absolute: 5.5 10*3/uL (ref 1.4–7.0)
Neutrophils: 61 %
Platelets: 339 10*3/uL (ref 150–450)
RBC: 4.34 x10E6/uL (ref 3.77–5.28)
RDW: 13 % (ref 11.7–15.4)
WBC: 8.9 10*3/uL (ref 3.4–10.8)

## 2022-05-14 LAB — LIPID PANEL
Chol/HDL Ratio: 4.4 ratio (ref 0.0–4.4)
Cholesterol, Total: 184 mg/dL (ref 100–199)
HDL: 42 mg/dL (ref >39)
LDL Chol Calc (NIH): 120 mg/dL — ABNORMAL HIGH (ref 0–99)
Triglycerides: 121 mg/dL (ref 0–149)
VLDL Cholesterol Cal: 22 mg/dL (ref 5–40)

## 2022-05-14 LAB — TSH: TSH: 1.92 u[IU]/mL (ref 0.450–4.500)

## 2022-05-18 ENCOUNTER — Other Ambulatory Visit: Payer: Self-pay

## 2022-05-19 ENCOUNTER — Telehealth: Payer: Self-pay | Admitting: Family

## 2022-05-19 NOTE — Telephone Encounter (Signed)
Rebecca [name], they need to make an appt to be seen about pain. Either with Denny Peon or Dr.Duda

## 2022-05-19 NOTE — Telephone Encounter (Signed)
Patient's daughter Fausto Skillern called advised patient have pain/numbness in her right leg. Fausto Skillern said patient has had this pain for 2 weeks now. Fausto Skillern said she wanted the doctor to be aware of this because patient had surgery in 2021. The number to contact Fausto Skillern is [phone]

## 2022-05-21 ENCOUNTER — Other Ambulatory Visit: Payer: Self-pay

## 2022-05-26 ENCOUNTER — Encounter: Payer: Self-pay | Admitting: Family

## 2022-05-26 ENCOUNTER — Ambulatory Visit (INDEPENDENT_AMBULATORY_CARE_PROVIDER_SITE_OTHER): Payer: Self-pay

## 2022-05-26 ENCOUNTER — Other Ambulatory Visit: Payer: Self-pay

## 2022-05-26 ENCOUNTER — Ambulatory Visit (INDEPENDENT_AMBULATORY_CARE_PROVIDER_SITE_OTHER): Payer: Self-pay | Admitting: Family

## 2022-05-26 DIAGNOSIS — M5416 Radiculopathy, lumbar region: Secondary | ICD-10-CM

## 2022-05-26 MED ORDER — PREDNISONE 50 MG PO TABS
ORAL_TABLET | ORAL | 0 refills | Status: DC
  Filled 2022-05-26: qty 5, 5d supply, fill #0

## 2022-05-26 NOTE — Progress Notes (Signed)
Office Visit Note   Patient: Rebecca [name]           Date of Birth: 07-24-1966           MRN: [id number] Visit Date: 05/26/2022              Requested by: Grayce Sessions, NP [id number] 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  She presents today concerned for worsening numbness and pain from the knee down to the right lower extremity  She did have significant neuropathic and phantom pain at the time of her amputation and for many months following.  She states that that eventually resolved she no longer had issues with burning and numbness and shooting pain  About 3 months ago she had to get out of bed suddenly to get to the restroom and has had difficulty with low back pain and pain from the knee down since.  She states that when she walks she feels she is having to drag or carry her right lower extremity she often has to lift it to get into the car.  She feels messages are not getting from her brain to her foot to walk  Assessment & Plan: Visit Diagnoses:  1. Radiculopathy, lumbar region     Plan: We will trial her on a prednisone burst she will follow-up in the office in 3 to 4 weeks for reevaluation consider epidural steroid injection or lumbar MRI  Follow-Up Instructions: No follow-ups on file.   Back Exam   Tenderness  The patient is experiencing tenderness in the lumbar.  Muscle Strength  The patient has normal back strength.  Tests  Straight leg raise right: positive      Patient is alert, oriented, no adenopathy, well-dressed, normal affect, normal respiratory effort.   Imaging: No results found.   Labs: Lab Results  Component Value Date   HGBA1C 10.1 (A) 05/13/2022   HGBA1C 8.7 (A) 02/09/2022   HGBA1C 8.2 (A) 11/11/2021    ESRSEDRATE 54 (H) 07/18/2017   CRP <0.8 07/18/2017   REPTSTATUS 07/13/2020 FINAL 07/05/2020   GRAMSTAIN  07/05/2020    FEW WBC PRESENT,BOTH PMN AND MONONUCLEAR FEW GRAM POSITIVE COCCI IN PAIRS IN CLUSTERS    CULT  07/05/2020    FEW STAPHYLOCOCCUS EPIDERMIDIS FEW ENTEROCOCCUS FAECALIS RARE PREVOTELLA SPECIES BETA LACTAMASE POSITIVE    LABORGA STAPHYLOCOCCUS EPIDERMIDIS 07/05/2020   LABORGA ENTEROCOCCUS FAECALIS 07/05/2020     Lab Results  Component Value Date   ALBUMIN 4.4 05/13/2022   ALBUMIN 4.5 11/11/2021   ALBUMIN 4.5 04/18/2021   PREALBUMIN 26.0 07/19/2017    Lab Results  Component Value Date   MG 2.0 11/28/2018   MG 1.8 06/23/2018   MG 2.3 05/13/2015   Lab Results  Component Value Date   VD25OH 28 (L) 01/06/2010   VD25OH 23 (L) 07/02/2009    Lab Results  Component Value Date   PREALBUMIN 26.0 07/19/2017      Latest Ref Rng & Units 05/13/2022    9:20 AM 11/11/2021   10:45 AM 04/18/2021   10:17 AM  CBC EXTENDED  WBC 3.4 - 10.8 x10E3/uL 8.9  10.5  9.0   RBC 3.77 - 5.28 x10E6/uL 4.34  3.97  4.39   Hemoglobin  11.1 - 15.9 g/dL 41.6  60.6  30.1   HCT 34.0 - 46.6 % 36.5  32.7  36.8   Platelets 150 - 450 x10E3/uL 339  366  326   NEUT# 1.4 - 7.0 x10E3/uL 5.5  6.7  5.5   Lymph# 0.7 - 3.1 x10E3/uL 2.5  2.7  2.4      There is no height or weight on file to calculate BMI.  Orders:  Orders Placed This Encounter  Procedures   XR Lumbar Spine 2-3 Views   No orders of the defined types were placed in this encounter.    Procedures: No procedures performed  Clinical Data: No additional findings.  ROS:  All other systems negative, except as noted in the HPI. Review of Systems  Musculoskeletal:  Positive for back pain.  Neurological:  Positive for weakness and numbness.    Objective: Vital Signs: LMP  (LMP Unknown)   Specialty Comments:  No specialty comments available.  PMFS History: Patient Active Problem List   Diagnosis Date Noted    Osteomyelitis of second toe of right foot (HCC)    Ulcer of right foot, limited to breakdown of skin (HCC) 02/21/2020   Diarrhea    Hyperthyroidism    Insulin dependent diabetes mellitus    Orthostasis    Vaginal discharge    Infectious diarrhea 11/26/2018   Hyperkalemia 11/26/2018   Bacteremia 06/21/2018   Sepsis secondary to UTI (HCC) 06/20/2018   Right great toe amputee (HCC) 08/05/2017   Mild renal insufficiency 07/19/2017   AKI (acute kidney injury) (HCC) 03/04/2016   Essential hypertension 05/14/2015   CAD- with distal LAD disease, ? 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/2012   Past Medical History:  Diagnosis Date   Anemia    Diabetes mellitus    Dyslipidemia    Hypertension    Non-STEMI (non-ST elevated myocardial infarction) [facility])    Medical therapy for distal LAD dissection   Thyroid disease     Family History  Problem Relation Age of Onset   Cancer Mother    Hypertension Father    Hyperlipidemia Father    Diabetes Sister    Diabetes Sister    Diabetes Sister    Diabetes Sister    Diabetes Brother    Diabetes Brother    Diabetes Brother    Diabetes Brother    Diabetes Brother    Breast cancer Neg Hx     Past Surgical History:  Procedure Laterality Date   AMPUTATION Right 07/21/2017   Procedure: RIGHT GREAT TOE AMPUTATION;  Surgeon: Nadara Mustard, MD;  Location: [facility] OR;  Service: Orthopedics;  Laterality: Right;   AMPUTATION Right 07/05/2020   Procedure: RIGHT FOOT 2ND RAY AMPUTATION;  Surgeon: Nadara Mustard, MD;  Location: [facility] OR;  Service: Orthopedics;  Laterality: Right;   CARDIAC CATHETERIZATION N/A 05/14/2015   Procedure: Left Heart Cath and Coronary Angiography;  Surgeon: Marykay Lex, MD;  dLAD 60%>>95% (small vessel, ?dissection), CFX & RCA systems no sig dz, EF & LVEDP nl      Social History   Occupational History   Occupation: unemployed  Tobacco Use   Smoking status: Never   Smokeless tobacco: Never  Vaping Use   Vaping Use: Never used  Substance and Sexual Activity   Alcohol use: No   Drug use: No   Sexual activity: Yes

## 2022-06-02 ENCOUNTER — Other Ambulatory Visit: Payer: Self-pay

## 2022-06-09 ENCOUNTER — Other Ambulatory Visit: Payer: Self-pay

## 2022-06-12 ENCOUNTER — Other Ambulatory Visit: Payer: Self-pay

## 2022-06-14 ENCOUNTER — Telehealth: Payer: Self-pay | Admitting: Family

## 2022-06-14 DIAGNOSIS — J208 Acute bronchitis due to other specified organisms: Secondary | ICD-10-CM

## 2022-06-14 DIAGNOSIS — B9689 Other specified bacterial agents as the cause of diseases classified elsewhere: Secondary | ICD-10-CM

## 2022-06-14 MED ORDER — BENZONATATE 100 MG PO CAPS: 100.0000 mg | ORAL_CAPSULE | Freq: Three times a day (TID) | ORAL | 0 refills | Status: DC | PRN

## 2022-06-14 MED ORDER — PREDNISONE 10 MG (21) PO TBPK: ORAL_TABLET | ORAL | 0 refills | Status: DC

## 2022-06-14 MED ORDER — DOXYCYCLINE HYCLATE 100 MG PO TABS: 100.0000 mg | ORAL_TABLET | Freq: Two times a day (BID) | ORAL | 0 refills | Status: DC

## 2022-06-14 NOTE — Progress Notes (Signed)
Virtual Visit Consent   [name], you are scheduled for a virtual visit with a [facility] provider today. Just as with appointments in the office, your consent must be obtained to participate. Your consent will be active for this visit and any virtual visit you may have with one of our providers in the next 365 days. If you have a MyChart account, a copy of this consent can be sent to you electronically.  As this is a virtual visit, video technology does not allow for your provider to perform a traditional examination. This may limit your provider's ability to fully assess your condition. If your provider identifies any concerns that need to be evaluated in person or the need to arrange testing (such as labs, EKG, etc.), we will make arrangements to do so. Although advances in technology are sophisticated, we cannot ensure that it will always work on either your end or our end. If the connection with a video visit is poor, the visit may have to be switched to a telephone visit. With either a video or telephone visit, we are not always able to ensure that we have a secure connection.  By engaging in this virtual visit, you consent to the provision of healthcare and authorize for your insurance to be billed (if applicable) for the services provided during this visit. Depending on your insurance coverage, you may receive a charge related to this service.  I need to obtain your verbal consent now. Are you willing to proceed with your visit today? Armie I Domanski has provided verbal consent on 06/14/2022 for a virtual visit (video or telephone). Evelina Dun, FNP  Date: 06/14/2022 8:41 AM  Virtual Visit via Video Note   I, Evelina Dun, connected with  Rebecca [name]  ([id number], [birth year]) on 06/14/22 at  8:30 AM EDT by a video-enabled telemedicine application and verified that I am speaking with the correct person using two identifiers.  Interpreter used.    Location: Patient: Virtual Visit Location Patient: Home Provider: Virtual Visit Location Provider: Home Office   I discussed the limitations of evaluation and management by telemedicine and the availability of in person appointments. The patient expressed understanding and agreed to proceed.    History of Present Illness: Rebecca [name] is a 56 y.o. who identifies as a female who was assigned female at birth, and is being seen today for cough. Reports chest pain when coughing, body aches, chills, and ear pain. Has taken a home COVID test that was negative.   HPI: Cough This is a new problem. The current episode started in the past 7 days. The problem has been gradually worsening. The problem occurs every few minutes. The cough is Productive of sputum. Associated symptoms include ear congestion, ear pain, a fever, headaches, myalgias, nasal congestion, postnasal drip and rhinorrhea. She has tried rest and OTC inhaler for the symptoms.    Problems:  Patient Active Problem List   Diagnosis Date Noted   Osteomyelitis of second toe of right foot ([place])    Ulcer of right foot, limited to breakdown of skin ([place]) 02/21/2020   Diarrhea    Hyperthyroidism    Insulin dependent diabetes mellitus    Orthostasis    Vaginal discharge    Infectious diarrhea 11/26/2018   Hyperkalemia 11/26/2018   Bacteremia 06/21/2018   Sepsis secondary to UTI ([place]) 06/20/2018   Right great toe amputee ([place]) 08/05/2017   Mild renal insufficiency 07/19/2017   AKI (acute kidney injury) ([place]) 03/04/2016  Essential hypertension 05/14/2015   CAD- with distal LAD disease, ? 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/2012    Allergies: No Known  Allergies Medications:  Current Outpatient Medications:    benzonatate (TESSALON PERLES) 100 MG capsule, Take 1 capsule (100 mg total) by mouth 3 (three) times daily as needed., Disp: 20 capsule, Rfl: 0   doxycycline (VIBRA-TABS) 100 MG tablet, Take 1 tablet (100 mg total) by mouth 2 (two) times daily., Disp: 20 tablet, Rfl: 0   predniSONE (STERAPRED UNI-PAK 21 TAB) 10 MG (21) TBPK tablet, Use as directed, Disp: 21 tablet, Rfl: 0   amLODipine (NORVASC) 10 MG tablet, Take 1 tablet (10 mg total) by mouth daily., Disp: 90 tablet, Rfl: 1   aspirin 81 MG EC tablet, Take 1 tablet (81 mg total) by mouth daily., Disp: 90 tablet, Rfl: 1   atorvastatin (LIPITOR) 40 MG tablet, Take 1 tablet (40 mg total) by mouth daily., Disp: 90 tablet, Rfl: 1   ciclopirox (PENLAC) 8 % solution, Apply topically at bedtime. Apply over nail and surrounding skin. Apply daily over previous coat. After seven (7) days, may remove with alcohol and continue cycle., Disp: 6.6 mL, Rfl: 2   Dulaglutide (TRULICITY) 3 0000000 SOPN, Inject 3 mg as directed once a week., Disp: 3 mL, Rfl: 6   gabapentin (NEURONTIN) 400 MG capsule, TAKE 1 CAPSULE (400 MG TOTAL) BY MOUTH 4 (FOUR) TIMES DAILY., Disp: 360 capsule, Rfl: 1   insulin aspart (NOVOLOG FLEXPEN) 100 UNIT/ML FlexPen, Inject 10 units into the skin 2 times daily before lunch and supper, Disp: 15 mL, Rfl: 3   Insulin Glargine (BASAGLAR KWIKPEN) 100 UNIT/ML, Inject 65 Units into the skin daily., Disp: 18 mL, Rfl: 2   Insulin Pen Needle 31G X 8 MM MISC, Inject 1 each into the skin at bedtime., Disp: 90 each, Rfl: 1   losartan (COZAAR) 100 MG tablet, TAKE 1 TABLET (100 MG TOTAL) BY MOUTH DAILY., Disp: 90 tablet, Rfl: 1   meclizine (ANTIVERT) 25 MG tablet, Take 0.5-1 tablets (12.5-25 mg total) by mouth 3 (three) times daily as needed for dizziness. (Patient not taking: Reported on 05/13/2022), Disp: 30 tablet, Rfl: 0   metFORMIN (GLUCOPHAGE-XR) 500 MG 24 hr tablet, TAKE 1 TABLET (500 MG TOTAL)  BY MOUTH 2 (TWO) TIMES DAILY AFTER A MEAL., Disp: 180 tablet, Rfl: 2   methimazole (TAPAZOLE) 5 MG tablet, TAKE 1 TABLET (5 MG TOTAL) BY MOUTH EVERY MONDAY, WEDNESDAY, AND FRIDAY., Disp: 90 tablet, Rfl: 1   neomycin-polymyxin-hydrocortisone (CORTISPORIN) OTIC solution, Place 3 drops into the left ear 4 (four) times daily. X 7 days, Disp: 10 mL, Rfl: 0  Observations/Objective: Patient is well-developed, well-nourished in no acute distress.  Resting comfortably  at home.  Head is normocephalic, atraumatic.  No labored breathing.  Speech is clear and coherent with logical content.  Patient is alert and oriented at baseline.  Coughing and nasal congestion  Assessment and Plan: 1. Acute bacterial bronchitis - doxycycline (VIBRA-TABS) 100 MG tablet; Take 1 tablet (100 mg total) by mouth 2 (two) times daily.  Dispense: 20 tablet; Refill: 0 - predniSONE (STERAPRED UNI-PAK 21 TAB) 10 MG (21) TBPK tablet; Use as directed  Dispense: 21 tablet; Refill: 0 - benzonatate (TESSALON PERLES) 100  MG capsule; Take 1 capsule (100 mg total) by mouth 3 (three) times daily as needed.  Dispense: 20 capsule; Refill: 0  - Take meds as prescribed - Use a cool mist humidifier  -Use saline nose sprays frequently -Force fluids -For any cough or congestion  Use plain Mucinex- regular strength or max strength is fine -For fever or aces or pains- take tylenol or ibuprofen. -Throat lozenges if help -Start doxycycline and prednisone today.  If symptoms worsen or do not improve she needs to be seen in person for a chest x-ray  Follow Up Instructions: I discussed the assessment and treatment plan with the patient. The patient was provided an opportunity to ask questions and all were answered. The patient agreed with the plan and demonstrated an understanding of the instructions.  A copy of instructions were sent to the patient via MyChart unless otherwise noted below.     The patient was advised to call back or seek an  in-person evaluation if the symptoms worsen or if the condition fails to improve as anticipated.  Time:  I spent 10 minutes with the patient via telehealth technology discussing the above problems/concerns.    Evelina Dun, FNP

## 2022-06-19 ENCOUNTER — Other Ambulatory Visit: Payer: Self-pay

## 2022-06-25 ENCOUNTER — Ambulatory Visit (INDEPENDENT_AMBULATORY_CARE_PROVIDER_SITE_OTHER): Payer: Self-pay | Admitting: *Deleted

## 2022-06-25 NOTE — Telephone Encounter (Signed)
Will forward to provider  

## 2022-06-25 NOTE — Telephone Encounter (Signed)
Interpreter: Maria# [phone] Chief Complaint: R leg swelling and pain Symptoms: pain in leg, heat coming from leg,swelling above the knee Frequency: 2 months- getting worse Pertinent Negatives: Patient denies SOB, chest pain Disposition: [] ED /[x] Urgent Care (no appt availability in office) / [] Appointment(In office/virtual)/ []  [facility]/ [] Home Care/ [] Refused Recommended Disposition /[x] [facility]/ []  Follow-up with PCP Additional Notes: No open appointment- advised mobile unit/UC today

## 2022-06-25 NOTE — Telephone Encounter (Signed)
Reason for Disposition  [1] Thigh, calf, or ankle swelling AND [2] only 1 side  Answer Assessment - Initial Assessment Questions 1. ONSET: "When did the swelling start?" (e.g., minutes, hours, days)     Recently- 2 months- worse with pain 2. LOCATION: "What part of the leg is swollen?"  "Are both legs swollen or just one leg?"     Right leg 3. SEVERITY: "How bad is the swelling?" (e.g., localized; mild, moderate, severe)   - Localized: Small area of swelling localized to one leg.   - MILD pedal edema: Swelling limited to foot and ankle, pitting edema < 1/4 inch (6 mm) deep, rest and elevation eliminate most or all swelling.   - MODERATE edema: Swelling of lower leg to knee, pitting edema > 1/4 inch (6 mm) deep, rest and elevation only partially reduce swelling.   - SEVERE edema: Swelling extends above knee, facial or hand swelling present.      Above the knee 4. REDNESS: "Does the swelling look red or infected?"     no 5. PAIN: "Is the swelling painful to touch?" If Yes, ask: "How painful is it?"   (Scale 1-10; mild, moderate or severe)     Knee to foot- hard to walk on steps 6. FEVER: "Do you have a fever?" If Yes, ask: "What is it, how was it measured, and when did it start?"      Feel leg is hot 7. CAUSE: "What do you think is causing the leg swelling?"     unsure 8. MEDICAL HISTORY: "Do you have a history of blood clots (e.g., DVT), cancer, heart failure, kidney disease, or liver failure?"     *No Answer* 9. RECURRENT SYMPTOM: "Have you had leg swelling before?" If Yes, ask: "When was the last time?" "What happened that time?"     X edema both legs 10. OTHER SYMPTOMS: "Do you have any other symptoms?" (e.g., chest pain, difficulty breathing)       no 11. PREGNANCY: "Is there any chance you are pregnant?" "When was your last menstrual period?"       *No Answer*  Protocols used: Leg Swelling and Edema-A-AH

## 2022-06-25 NOTE — Telephone Encounter (Signed)
We will follow-up with patient after seen by mobile bus

## 2022-07-16 ENCOUNTER — Other Ambulatory Visit: Payer: Self-pay

## 2022-07-17 ENCOUNTER — Other Ambulatory Visit (INDEPENDENT_AMBULATORY_CARE_PROVIDER_SITE_OTHER): Payer: Self-pay | Admitting: Primary Care

## 2022-07-17 NOTE — Telephone Encounter (Signed)
Requested Prescriptions  Pending Prescriptions Disp Refills  . TRULICITY 3 PP/8.9QM SOPN [Pharmacy Med Name: Trulicity Subcutaneous Solution Pen-injector 3 MG/0.5ML] 6 mL 0    Sig: INJECT 3 MG (0.5 ML) UNDER THE SKIN ONCE A WEEK     Endocrinology:  Diabetes - GLP-1 Receptor Agonists Failed - 07/17/2022  3:00 PM      Failed - HBA1C is between 0 and 7.9 and within 180 days    Hemoglobin A1C  Date Value Ref Range Status  05/13/2022 10.1 (A) 4.0 - 5.6 % Final   HbA1c, POC (controlled diabetic range)  Date Value Ref Range Status  04/18/2021 9.7 (A) 0.0 - 7.0 % Final         Passed - Valid encounter within last 6 months    Recent Outpatient Visits          2 months ago Type 2 diabetes mellitus without complication, with long-term current use of insulin ([place])   [place] [facility] Juluis Mire P, NP   5 months ago Type 2 diabetes mellitus without complication, with long-term current use of insulin ([place])   [place], Michelle P, NP   8 months ago Encounter for diabetic foot exam ([place])   [place], Michelle P, NP   11 months ago Type 2 diabetes mellitus without complication, with long-term current use of insulin ([place])   [place] [facility] Kerin Perna, NP   1 year ago Uncontrolled type 2 diabetes mellitus with hyperglycemia [facility])   [place], Jarome Matin, RPH-CPP      Future Appointments            In 1 month Oletta Lamas, Milford Cage, NP [place]

## 2022-08-17 ENCOUNTER — Ambulatory Visit (INDEPENDENT_AMBULATORY_CARE_PROVIDER_SITE_OTHER): Payer: Self-pay | Admitting: Primary Care

## 2022-08-17 ENCOUNTER — Encounter (INDEPENDENT_AMBULATORY_CARE_PROVIDER_SITE_OTHER): Payer: Self-pay | Admitting: Primary Care

## 2022-08-17 VITALS — BP 135/81 | HR 74 | Resp 16 | Ht 62.0 in | Wt 171.2 lb

## 2022-08-17 DIAGNOSIS — E119 Type 2 diabetes mellitus without complications: Secondary | ICD-10-CM

## 2022-08-17 DIAGNOSIS — Z794 Long term (current) use of insulin: Secondary | ICD-10-CM

## 2022-08-17 DIAGNOSIS — Z1211 Encounter for screening for malignant neoplasm of colon: Secondary | ICD-10-CM

## 2022-08-17 DIAGNOSIS — I10 Essential (primary) hypertension: Secondary | ICD-10-CM

## 2022-08-17 DIAGNOSIS — E0590 Thyrotoxicosis, unspecified without thyrotoxic crisis or storm: Secondary | ICD-10-CM

## 2022-08-17 DIAGNOSIS — E782 Mixed hyperlipidemia: Secondary | ICD-10-CM

## 2022-08-17 LAB — POCT GLYCOSYLATED HEMOGLOBIN (HGB A1C): HbA1c, POC (controlled diabetic range): 7.3 % — AB (ref 0.0–7.0)

## 2022-08-17 NOTE — Progress Notes (Signed)
Subjective:  Patient ID: Rebecca [name], female    DOB: 1966-05-30  Age: 56 y.o. MRN: [id number]  CC: No chief complaint on file.   HPI Rebecca [name] presents for Follow-up of diabetes and hypertension.  Patient does not check blood sugar at home  Compliant with meds - Yes Checking CBGs? No  Fasting avg -   Postprandial average -  Exercising regularly? - No Watching carbohydrate intake? - Yes Neuropathy ? - Yes Hypoglycemic events - No  - Recovers with :   Pertinent ROS:  Polyuria - No Polydipsia - No Vision problems - No Patient has No headache, No chest pain, No abdominal pain - No Nausea, No new weakness tingling or numbness, No Cough - shortness of breath  Medications as noted below. Taking them regularly without complication/adverse reaction being reported today.   History Domitila has a past medical history of Anemia, Diabetes mellitus, Dyslipidemia, Hypertension, Non-STEMI (non-ST elevated myocardial infarction) ([place]), and Thyroid disease.   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   Her family history includes Cancer in her mother; Diabetes in her brother, brother, brother, brother, brother, sister, sister, sister, and sister; Hyperlipidemia in her father; Hypertension in her father.She reports that she has never smoked. She has never used smokeless tobacco. She reports that she does not drink alcohol and does not use drugs.  Current Outpatient Medications on File Prior to Visit  Medication Sig Dispense Refill   amLODipine (NORVASC) 10 MG tablet Take 1 tablet (10 mg total) by mouth daily. 90 tablet 1   aspirin 81 MG EC tablet Take 1 tablet (81 mg total) by mouth daily. 90 tablet 1   atorvastatin (LIPITOR) 40 MG tablet Take 1 tablet (40 mg total) by mouth daily. 90 tablet 1   benzonatate (TESSALON PERLES) 100 MG capsule Take 1 capsule (100  mg total) by mouth 3 (three) times daily as needed. (Patient not taking: Reported on 08/17/2022) 20 capsule 0   ciclopirox (PENLAC) 8 % solution Apply topically at bedtime. Apply over nail and surrounding skin. Apply daily over previous coat. After seven (7) days, may remove with alcohol and continue cycle. 6.6 mL 2   doxycycline (VIBRA-TABS) 100 MG tablet Take 1 tablet (100 mg total) by mouth 2 (two) times daily. (Patient not taking: Reported on 08/17/2022) 20 tablet 0   gabapentin (NEURONTIN) 400 MG capsule TAKE 1 CAPSULE (400 MG TOTAL) BY MOUTH 4 (FOUR) TIMES DAILY. 360 capsule 1   insulin aspart (NOVOLOG FLEXPEN) 100 UNIT/ML FlexPen Inject 10 units into the skin 2 times daily before lunch and supper 15 mL 3   Insulin Glargine (BASAGLAR KWIKPEN) 100 UNIT/ML Inject 65 Units into the skin daily. 18 mL 2   Insulin Pen Needle 31G X 8 MM MISC Inject 1 each into the skin at bedtime. 90 each 1   losartan (COZAAR) 100 MG tablet TAKE 1 TABLET (100 MG TOTAL) BY MOUTH DAILY. 90 tablet 1   meclizine (ANTIVERT) 25 MG tablet Take 0.5-1 tablets (12.5-25 mg total) by mouth 3 (three) times daily as needed for dizziness. (Patient not taking: Reported on 05/13/2022) 30 tablet 0   metFORMIN (GLUCOPHAGE-XR) 500 MG 24 hr tablet TAKE 1 TABLET (500 MG TOTAL) BY MOUTH 2 (TWO) TIMES DAILY AFTER A MEAL. 180 tablet 2   methimazole (TAPAZOLE) 5 MG tablet TAKE 1 TABLET (5 MG TOTAL) BY MOUTH EVERY MONDAY, WEDNESDAY, AND FRIDAY. 90 tablet 1  neomycin-polymyxin-hydrocortisone (CORTISPORIN) OTIC solution Place 3 drops into the left ear 4 (four) times daily. X 7 days (Patient not taking: Reported on 08/17/2022) 10 mL 0   predniSONE (STERAPRED UNI-PAK 21 TAB) 10 MG (21) TBPK tablet Use as directed (Patient not taking: Reported on 08/17/2022) 21 tablet 0   TRULICITY 3 HY/8.5OY SOPN INJECT 3 MG (0.5 ML) UNDER THE SKIN ONCE A WEEK 6 mL 0   No current facility-administered medications on file prior to visit.    ROS Comprehensive ROS  Pertinent positive and negative noted in HPI    Objective:  Blood Pressure 135/81   Pulse 74   Respiration 16   Height _0  (1.575 m)   Weight 171 lb 3.2 oz (77.7 kg)   Last Menstrual Period  (LMP Unknown)   Oxygen Saturation 100%   Body Mass Index 31.31 kg/m   BP Readings from Last 3 Encounters:  08/17/22 135/81  05/13/22 138/86  02/09/22 136/83    Wt Readings from Last 3 Encounters:  08/17/22 171 lb 3.2 oz (77.7 kg)  05/13/22 179 lb 6.4 oz (81.4 kg)  02/09/22 179 lb 12.8 oz (81.6 kg)    Physical Exam Vitals reviewed.  Constitutional:      Appearance: Normal appearance. She is obese.  HENT:     Head: Normocephalic.     Right Ear: Tympanic membrane and external ear normal.     Left Ear: Tympanic membrane and external ear normal.     Nose: Nose normal.  Eyes:     Extraocular Movements: Extraocular movements intact.     Pupils: Pupils are equal, round, and reactive to light.  Cardiovascular:     Rate and Rhythm: Normal rate and regular rhythm.  Pulmonary:     Effort: Pulmonary effort is normal.     Breath sounds: Normal breath sounds.  Abdominal:     General: Bowel sounds are normal. There is distension.     Palpations: Abdomen is soft.  Musculoskeletal:        General: Normal range of motion.     Cervical back: Normal range of motion and neck supple.  Skin:    General: Skin is warm and dry.  Neurological:     Mental Status: She is alert and oriented to person, place, and time.  Psychiatric:        Mood and Affect: Mood normal.        Behavior: Behavior normal.    Lab Results  Component Value Date   HGBA1C 7.3 (A) 08/17/2022   HGBA1C 10.1 (A) 05/13/2022   HGBA1C 8.7 (A) 02/09/2022    Lab Results  Component Value Date   WBC 8.9 05/13/2022   HGB 11.9 05/13/2022   HCT 36.5 05/13/2022   PLT 339 05/13/2022   GLUCOSE 79 05/13/2022   CHOL 184 05/13/2022   TRIG 121 05/13/2022   HDL 42 05/13/2022   LDLCALC 120 (H) 05/13/2022   ALT 25 05/13/2022   AST  24 05/13/2022   NA 140 05/13/2022   K 4.8 05/13/2022   CL 101 05/13/2022   CREATININE 1.47 (H) 05/13/2022   BUN 27 (H) 05/13/2022   CO2 21 05/13/2022   TSH 1.920 05/13/2022   INR 1.08 05/13/2015   HGBA1C 7.3 (A) 08/17/2022   MICROALBUR 0.50 06/04/2009     Assessment & Plan:  Diagnoses and all orders for this visit:  Type 2 diabetes mellitus without complication, with long-term current use of insulin ([place]) - educated on lifestyle modifications, including but not  limited to diet choices and adding exercise to daily routine.   -     POCT glycosylated hemoglobin (Hb A1C) 10.1 previously to 7.3 -     Microalbumin / creatinine urine ratio  Essential hypertension BP goal - < 130/80 very close pleased Explained that having normal blood pressure is the goal and medications are helping to get to goal and maintain normal blood pressure. DIET: Limit salt intake, read nutrition labels to check salt content, limit fried and high fatty foods  Avoid using multisymptom OTC cold preparations that generally contain sudafed which can rise BP. Consult with pharmacist on best cold relief products to use for persons with HTN EXERCISE Discussed incorporating exercise such as walking - 30 minutes most days of the week and can do in 10 minute intervals    -     CMP14+EGFR  Hyperthyroidism Denies unintentional weight loss and a rapid or irregular heartbeat      TSH + free T4  Mixed hyperlipidemia On statin.  Healthy lifestyle diet of fruits vegetables fish nuts whole grains and low saturated fat . Foods high in cholesterol or liver, fatty meats,cheese, butter avocados, nuts and seeds, chocolate and fried foods. -     Lipid Panel  Colon cancer screening -     Fecal occult blood, imunochemical  I am having Malley I. Belli maintain her aspirin EC, Insulin Pen Needle, ciclopirox, neomycin-polymyxin-hydrocortisone, meclizine, Basaglar KwikPen, metFORMIN, losartan, NovoLOG FlexPen, gabapentin,  atorvastatin, amLODipine, methimazole, doxycycline, predniSONE, benzonatate, and Trulicity.  Follow-up:   Return in about 3 months (around 11/17/2022) for DM/HTN.  The above assessment and management plan was discussed with the patient. The patient verbalized understanding of and has agreed to the management plan. Patient is aware to call the clinic if symptoms fail to improve or worsen. Patient is aware when to return to the clinic for a follow-up visit. Patient educated on when it is appropriate to go to the emergency department.   Juluis Mire, NP-C

## 2022-08-18 LAB — CMP14+EGFR
ALT: 37 IU/L — ABNORMAL HIGH (ref 0–32)
AST: 35 IU/L (ref 0–40)
Albumin/Globulin Ratio: 1.7 (ref 1.2–2.2)
Albumin: 4.5 g/dL (ref 3.8–4.9)
Alkaline Phosphatase: 130 IU/L — ABNORMAL HIGH (ref 44–121)
BUN/Creatinine Ratio: 19 (ref 9–23)
BUN: 28 mg/dL — ABNORMAL HIGH (ref 6–24)
Bilirubin Total: 0.6 mg/dL (ref 0.0–1.2)
CO2: 22 mmol/L (ref 20–29)
Calcium: 9.8 mg/dL (ref 8.7–10.2)
Chloride: 101 mmol/L (ref 96–106)
Creatinine, Ser: 1.44 mg/dL — ABNORMAL HIGH (ref 0.57–1.00)
Globulin, Total: 2.7 g/dL (ref 1.5–4.5)
Glucose: 163 mg/dL — ABNORMAL HIGH (ref 70–99)
Potassium: 4.9 mmol/L (ref 3.5–5.2)
Sodium: 140 mmol/L (ref 134–144)
Total Protein: 7.2 g/dL (ref 6.0–8.5)
eGFR: 43 mL/min/{1.73_m2} — ABNORMAL LOW (ref >59)

## 2022-08-18 LAB — TSH+FREE T4
Free T4: 1.24 ng/dL (ref 0.82–1.77)
TSH: 2.08 u[IU]/mL (ref 0.450–4.500)

## 2022-08-18 LAB — LIPID PANEL
Chol/HDL Ratio: 2.9 ratio (ref 0.0–4.4)
Cholesterol, Total: 98 mg/dL — ABNORMAL LOW (ref 100–199)
HDL: 34 mg/dL — ABNORMAL LOW (ref >39)
LDL Chol Calc (NIH): 43 mg/dL (ref 0–99)
Triglycerides: 114 mg/dL (ref 0–149)
VLDL Cholesterol Cal: 21 mg/dL (ref 5–40)

## 2022-08-18 LAB — MICROALBUMIN / CREATININE URINE RATIO
Creatinine, Urine: 205.2 mg/dL
Microalb/Creat Ratio: 87 mg/g creat — ABNORMAL HIGH (ref 0–29)
Microalbumin, Urine: 179.4 ug/mL

## 2022-08-21 ENCOUNTER — Other Ambulatory Visit: Payer: Self-pay

## 2022-09-11 ENCOUNTER — Telehealth (INDEPENDENT_AMBULATORY_CARE_PROVIDER_SITE_OTHER): Payer: Self-pay | Admitting: Primary Care

## 2022-09-23 ENCOUNTER — Other Ambulatory Visit: Payer: Self-pay

## 2022-09-25 ENCOUNTER — Other Ambulatory Visit: Payer: Self-pay

## 2022-10-12 ENCOUNTER — Other Ambulatory Visit (INDEPENDENT_AMBULATORY_CARE_PROVIDER_SITE_OTHER): Payer: Self-pay | Admitting: Primary Care

## 2022-10-13 NOTE — Telephone Encounter (Signed)
Requested Prescriptions  Pending Prescriptions Disp Refills   TRULICITY 3 EX/5.2WU SOPN [Pharmacy Med Name: Trulicity Subcutaneous Solution Pen-injector 3 MG/0.5ML] 6 mL 0    Sig: INJECT 3 MG (0.5 ML) UNDER THE SKIN ONCE A WEEK     Endocrinology:  Diabetes - GLP-1 Receptor Agonists Passed - 10/12/2022  5:27 AM      Passed - HBA1C is between 0 and 7.9 and within 180 days    HbA1c, POC (controlled diabetic range)  Date Value Ref Range Status  08/17/2022 7.3 (A) 0.0 - 7.0 % Final         Passed - Valid encounter within last 6 months    Recent Outpatient Visits           1 month ago Type 2 diabetes mellitus without complication, with long-term current use of insulin ([place])   [place] [facility] Kerin Perna, NP   5 months ago Type 2 diabetes mellitus without complication, with long-term current use of insulin ([place])   [place] [facility] Kerin Perna, NP   8 months ago Type 2 diabetes mellitus without complication, with long-term current use of insulin ([place])   [place], Michelle P, NP   11 months ago Encounter for diabetic foot exam [facility])   [place] Kerin Perna, NP   1 year ago Type 2 diabetes mellitus without complication, with long-term current use of insulin [facility])   [place] [facility] Kerin Perna, NP       Future Appointments             In 1 month Oletta Lamas, Milford Cage, NP [place]

## 2022-10-27 ENCOUNTER — Other Ambulatory Visit: Payer: Self-pay

## 2022-11-17 ENCOUNTER — Ambulatory Visit (INDEPENDENT_AMBULATORY_CARE_PROVIDER_SITE_OTHER): Payer: No Typology Code available for payment source | Admitting: Primary Care

## 2022-11-27 ENCOUNTER — Other Ambulatory Visit: Payer: Self-pay

## 2022-11-27 ENCOUNTER — Other Ambulatory Visit (INDEPENDENT_AMBULATORY_CARE_PROVIDER_SITE_OTHER): Payer: Self-pay | Admitting: Primary Care

## 2022-11-27 MED ORDER — ATORVASTATIN CALCIUM 40 MG PO TABS
40.0000 mg | ORAL_TABLET | Freq: Every day | ORAL | 0 refills | Status: DC
  Filled 2022-11-27: qty 90, 90d supply, fill #0

## 2022-11-27 NOTE — Telephone Encounter (Signed)
Requested Prescriptions  Pending Prescriptions Disp Refills   atorvastatin (LIPITOR) 40 MG tablet 90 tablet 0    Sig: Take 1 tablet (40 mg total) by mouth daily.     Cardiovascular:  Antilipid - Statins Failed - 11/27/2022  1:18 PM      Failed - Lipid Panel in normal range within the last 12 months    Cholesterol, Total  Date Value Ref Range Status  08/17/2022 98 (L) 100 - 199 mg/dL Final   LDL Chol Calc (NIH)  Date Value Ref Range Status  08/17/2022 43 0 - 99 mg/dL Final   HDL  Date Value Ref Range Status  08/17/2022 34 (L) >39 mg/dL Final   Triglycerides  Date Value Ref Range Status  08/17/2022 114 0 - 149 mg/dL Final         Passed - Patient is not pregnant      Passed - Valid encounter within last 12 months    Recent Outpatient Visits           3 months ago Type 2 diabetes mellitus without complication, with long-term current use of insulin ([place])   [place] [facility] Kerin Perna, NP   6 months ago Type 2 diabetes mellitus without complication, with long-term current use of insulin ([place])   [place] Kerin Perna, NP   9 months ago Type 2 diabetes mellitus without complication, with long-term current use of insulin ([place])   [place] Kerin Perna, NP   1 year ago Encounter for diabetic foot exam [facility])   [place], Michelle P, NP   1 year ago Type 2 diabetes mellitus without complication, with long-term current use of insulin [facility])   [place] Kerin Perna, NP

## 2022-12-02 ENCOUNTER — Encounter (INDEPENDENT_AMBULATORY_CARE_PROVIDER_SITE_OTHER): Payer: Self-pay

## 2022-12-02 ENCOUNTER — Other Ambulatory Visit: Payer: Self-pay

## 2022-12-30 ENCOUNTER — Other Ambulatory Visit: Payer: Self-pay

## 2022-12-31 ENCOUNTER — Other Ambulatory Visit (INDEPENDENT_AMBULATORY_CARE_PROVIDER_SITE_OTHER): Payer: Self-pay | Admitting: Primary Care

## 2022-12-31 ENCOUNTER — Other Ambulatory Visit: Payer: Self-pay

## 2022-12-31 DIAGNOSIS — E1165 Type 2 diabetes mellitus with hyperglycemia: Secondary | ICD-10-CM

## 2022-12-31 MED ORDER — INSULIN LISPRO (1 UNIT DIAL) 100 UNIT/ML (KWIKPEN)
10.0000 [IU] | PEN_INJECTOR | Freq: Two times a day (BID) | SUBCUTANEOUS | 0 refills | Status: DC
  Filled 2022-12-31: qty 6, 30d supply, fill #0

## 2023-01-01 ENCOUNTER — Other Ambulatory Visit (HOSPITAL_COMMUNITY): Payer: Self-pay

## 2023-01-01 ENCOUNTER — Other Ambulatory Visit: Payer: Self-pay

## 2023-01-01 MED ORDER — GABAPENTIN 400 MG PO CAPS
400.0000 mg | ORAL_CAPSULE | Freq: Four times a day (QID) | ORAL | 0 refills | Status: DC
  Filled 2023-01-01: qty 360, 90d supply, fill #0

## 2023-01-01 NOTE — Telephone Encounter (Signed)
Requested Prescriptions  Pending Prescriptions Disp Refills   gabapentin (NEURONTIN) 400 MG capsule 360 capsule 0    Sig: TAKE 1 CAPSULE (400 MG TOTAL) BY MOUTH 4 (FOUR) TIMES DAILY.     Neurology: Anticonvulsants - gabapentin Failed - 12/31/2022  6:38 PM      Failed - Cr in normal range and within 360 days    Creat  Date Value Ref Range Status  07/30/2015 1.13 (H) 0.50 - 1.10 mg/dL Final   Creatinine, Ser  Date Value Ref Range Status  08/17/2022 1.44 (H) 0.57 - 1.00 mg/dL Final         Passed - Completed PHQ-2 or PHQ-9 in the last 360 days      Passed - Valid encounter within last 12 months    Recent Outpatient Visits           4 months ago Type 2 diabetes mellitus without complication, with long-term current use of insulin ([facility])   [place] [facility] Grayce Sessions, NP   7 months ago Type 2 diabetes mellitus without complication, with long-term current use of insulin ([facility])   [place] [facility] Grayce Sessions, NP   10 months ago Type 2 diabetes mellitus without complication, with long-term current use of insulin (H[facility])   [place] [facility] Grayce Sessions, NP   1 year ago Encounter for diabetic foot exam [facility])   [place] [facility] Grayce Sessions, NP   1 year ago Type 2 diabetes mellitus without complication, with long-term current use of insulin [facility])   [place] [facility] Grayce Sessions, NP

## 2023-01-07 ENCOUNTER — Other Ambulatory Visit: Payer: Self-pay

## 2023-01-12 ENCOUNTER — Ambulatory Visit (INDEPENDENT_AMBULATORY_CARE_PROVIDER_SITE_OTHER): Payer: No Typology Code available for payment source | Admitting: Primary Care

## 2023-01-12 ENCOUNTER — Other Ambulatory Visit: Payer: Self-pay

## 2023-01-13 ENCOUNTER — Ambulatory Visit (INDEPENDENT_AMBULATORY_CARE_PROVIDER_SITE_OTHER): Payer: Self-pay | Admitting: Primary Care

## 2023-01-13 ENCOUNTER — Encounter (INDEPENDENT_AMBULATORY_CARE_PROVIDER_SITE_OTHER): Payer: Self-pay | Admitting: Primary Care

## 2023-01-13 VITALS — BP 140/84 | HR 99 | Resp 16 | Ht 63.0 in | Wt 177.8 lb

## 2023-01-13 DIAGNOSIS — E0590 Thyrotoxicosis, unspecified without thyrotoxic crisis or storm: Secondary | ICD-10-CM

## 2023-01-13 DIAGNOSIS — E6609 Other obesity due to excess calories: Secondary | ICD-10-CM

## 2023-01-13 DIAGNOSIS — I10 Essential (primary) hypertension: Secondary | ICD-10-CM

## 2023-01-13 DIAGNOSIS — E782 Mixed hyperlipidemia: Secondary | ICD-10-CM

## 2023-01-13 DIAGNOSIS — Z794 Long term (current) use of insulin: Secondary | ICD-10-CM

## 2023-01-13 DIAGNOSIS — E66811 Obesity, class 1: Secondary | ICD-10-CM

## 2023-01-13 DIAGNOSIS — E119 Type 2 diabetes mellitus without complications: Secondary | ICD-10-CM

## 2023-01-13 DIAGNOSIS — Z6831 Body mass index (BMI) 31.0-31.9, adult: Secondary | ICD-10-CM

## 2023-01-13 LAB — POCT GLYCOSYLATED HEMOGLOBIN (HGB A1C): HbA1c, POC (controlled diabetic range): 8 % — AB (ref 0.0–7.0)

## 2023-01-13 NOTE — Progress Notes (Signed)
[facility]  Rebecca [name], is a 57 y.o. female  ZHY:[id number]  EXB:[id number]  DOB - 1966/07/14  Chief Complaint  Patient presents with   Diabetes   Hypertension       Subjective:   Rebecca [name] is a 57 y.o. 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 Also, T2D denies polyuria, polydipsia , polyphagia or vision changes.  No problems updated.  No Known Allergies  Past Medical History:  Diagnosis Date   Anemia    Diabetes mellitus    Dyslipidemia    Hypertension    Non-STEMI (non-ST elevated myocardial infarction)    Medical therapy for distal LAD dissection   Thyroid disease     Current Outpatient Medications on File Prior to Visit  Medication Sig Dispense Refill   amLODipine (NORVASC) 10 MG tablet Take 1 tablet (10 mg total) by mouth daily. 90 tablet 1   aspirin 81 MG EC tablet Take 1 tablet (81 mg total) by mouth daily. 90 tablet 1   atorvastatin (LIPITOR) 40 MG tablet Take 1 tablet (40 mg total) by mouth daily. 90 tablet 0   benzonatate (TESSALON PERLES) 100 MG capsule Take 1 capsule (100 mg total) by mouth 3 (three) times daily as needed. (Patient not taking: Reported on 08/17/2022) 20 capsule 0   ciclopirox (PENLAC) 8 % solution Apply topically at bedtime. Apply over nail and surrounding skin. Apply daily over previous coat. After seven (7) days, may remove with alcohol and continue cycle. (Patient not taking: Reported on 01/13/2023) 6.6 mL 2   doxycycline (VIBRA-TABS) 100 MG tablet Take 1 tablet (100 mg total) by mouth 2 (two) times daily. (Patient not taking: Reported on 08/17/2022) 20 tablet 0   gabapentin (NEURONTIN) 400 MG capsule Take 1 capsule (400 mg total) by mouth 4 (four) times daily. 360 capsule 0   insulin aspart (NOVOLOG FLEXPEN)  100 UNIT/ML FlexPen Inject 10 units into the skin 2 times daily before lunch and supper 15 mL 3   Insulin Glargine (BASAGLAR KWIKPEN) 100 UNIT/ML Inject 65 Units into the skin daily. 18 mL 2   insulin lispro (HUMALOG KWIKPEN) 100 UNIT/ML KwikPen Inject 10 Units into the skin 2 (two) times daily before lunch and supper. 18 mL 0   Insulin Pen Needle 31G X 8 MM MISC Inject 1 each into the skin at bedtime. 90 each 1   losartan (COZAAR) 100 MG tablet TAKE 1 TABLET (100 MG TOTAL) BY MOUTH DAILY. 90 tablet 1   meclizine (ANTIVERT) 25 MG tablet Take 0.5-1 tablets (12.5-25 mg total) by mouth 3 (three) times daily as needed for dizziness. (Patient not taking: Reported on 05/13/2022) 30 tablet 0   metFORMIN (GLUCOPHAGE-XR) 500 MG 24 hr tablet TAKE 1 TABLET (500 MG TOTAL) BY MOUTH 2 (TWO) TIMES DAILY AFTER A MEAL. 180 tablet 2   methimazole (TAPAZOLE) 5 MG tablet TAKE 1 TABLET (5 MG TOTAL) BY MOUTH EVERY MONDAY, WEDNESDAY, AND FRIDAY. 90 tablet 1   neomycin-polymyxin-hydrocortisone (CORTISPORIN) OTIC solution Place 3 drops into the left ear 4 (four) times daily. X 7 days (Patient not taking: Reported on 08/17/2022) 10 mL 0   predniSONE (STERAPRED UNI-PAK 21 TAB) 10 MG (21) TBPK tablet Use as directed (Patient not taking: Reported on 08/17/2022) 21 tablet 0   TRULICITY 3 MG/0.5ML SOPN INJECT  3 MG (0.5 ML) UNDER THE SKIN ONCE A WEEK 6 mL 0   No current facility-administered medications on file prior to visit.    Objective:   Vitals:   01/13/23 0859 01/13/23 0901  BP: (Abnormal) 145/84 (Abnormal) 149/87  Pulse: 99   Resp: 16   SpO2: 100%   Weight: 177 lb 12.8 oz (80.6 kg)   Height: 5\' 3"  (1.6 m)     Comprehensive ROS Pertinent positive and negative noted in HPI   Exam General appearance : Awake, alert, not in any distress. Speech Clear. Not toxic looking HEENT: Atraumatic and Normocephalic, pupils equally reactive to light and accomodation Neck: Supple, no JVD. No cervical lymphadenopathy.  Chest:  Good air entry bilaterally, no added sounds  CVS: S1 S2 regular, no murmurs.  Abdomen: Bowel sounds present, Non tender and not distended with no gaurding, rigidity or rebound. Extremities: B/L Lower Ext shows no edema, both legs are warm to touch Neurology: Awake alert, and oriented X 3, CN II-XII intact, Non focal Skin: No Rash  Data Review Lab Results  Component Value Date   HGBA1C 8.0 (A) 01/13/2023   HGBA1C 7.3 (A) 08/17/2022   HGBA1C 10.1 (A) 05/13/2022    Assessment & Plan  Rebecca [name] was seen today for diabetes and hypertension.  Diagnoses and all orders for this visit:  Type 2 diabetes mellitus without complication, with long-term current use of insulin (HCC) - educated on lifestyle modifications, including but not limited to diet choices and adding exercise to daily routine.   -     POCT glycosylated hemoglobin (Hb A1C) -     Ambulatory referral to Ophthalmology -     CBC with Differential/Platelet  Class 1 obesity due to excess calories without serious comorbidity with body mass index (BMI) of 31.0 to 31.9 in adult Obesity is 30-39 indicating an excess in caloric intake or underlining conditions. This may lead to other co-morbidities. Educated on lifestyle modifications of diet and exercise which may reduce obesity.   -     TSH + free T4  Hyperthyroidism -     TSH + free T4  Mixed hyperlipidemia -     Lipid panel  Essential hypertension BP goal - < 130/80 Explained that having normal blood pressure is the goal and medications are helping to get to goal and maintain normal blood pressure. DIET: Limit salt intake, read nutrition labels to check salt content, limit fried and high fatty foods  Avoid using multisymptom OTC cold preparations that generally contain sudafed which can rise BP. Consult with pharmacist on best cold relief products to use for persons with HTN EXERCISE Discussed incorporating exercise such as walking - 30 minutes most days of the week and can do  in 10 minute intervals    -     Comprehensive metabolic panel     Patient have been counseled extensively about nutrition and exercise. Other issues discussed during this visit include: low cholesterol diet, weight control and daily exercise, foot care, annual eye examinations at Ophthalmology, importance of adherence with medications and regular follow-up. We also discussed long term complications of uncontrolled diabetes and hypertension.   Return in about 6 weeks (around 02/24/2023) for pap.  The patient was given clear instructions to go to ER or return to medical center if symptoms don't improve, worsen or new problems develop. The patient verbalized understanding. The patient was told to call to get lab results if they haven't heard anything in the next week.   This  note has been created with Education officer, environmental. Any transcriptional errors are unintentional.   Grayce Sessions, NP 01/13/2023, 9:16 AM

## 2023-01-14 LAB — CBC WITH DIFFERENTIAL/PLATELET
Basophils Absolute: 0.1 10*3/uL (ref 0.0–0.2)
Basos: 1 %
EOS (ABSOLUTE): 0.4 10*3/uL (ref 0.0–0.4)
Eos: 5 %
Hematocrit: 37.4 % (ref 34.0–46.6)
Hemoglobin: 11.9 g/dL (ref 11.1–15.9)
Immature Grans (Abs): 0 10*3/uL (ref 0.0–0.1)
Immature Granulocytes: 0 %
Lymphocytes Absolute: 2.3 10*3/uL (ref 0.7–3.1)
Lymphs: 30 %
MCH: 26.6 pg (ref 26.6–33.0)
MCHC: 31.8 g/dL (ref 31.5–35.7)
MCV: 84 fL (ref 79–97)
Monocytes Absolute: 0.6 10*3/uL (ref 0.1–0.9)
Monocytes: 8 %
Neutrophils Absolute: 4.2 10*3/uL (ref 1.4–7.0)
Neutrophils: 56 %
Platelets: 338 10*3/uL (ref 150–450)
RBC: 4.47 x10E6/uL (ref 3.77–5.28)
RDW: 13.5 % (ref 11.7–15.4)
WBC: 7.5 10*3/uL (ref 3.4–10.8)

## 2023-01-14 LAB — COMPREHENSIVE METABOLIC PANEL
ALT: 21 IU/L (ref 0–32)
AST: 25 IU/L (ref 0–40)
Albumin/Globulin Ratio: 1.6 (ref 1.2–2.2)
Albumin: 4.7 g/dL (ref 3.8–4.9)
Alkaline Phosphatase: 146 IU/L — ABNORMAL HIGH (ref 44–121)
BUN/Creatinine Ratio: 20 (ref 9–23)
BUN: 20 mg/dL (ref 6–24)
Bilirubin Total: 0.6 mg/dL (ref 0.0–1.2)
CO2: 22 mmol/L (ref 20–29)
Calcium: 10.3 mg/dL — ABNORMAL HIGH (ref 8.7–10.2)
Chloride: 105 mmol/L (ref 96–106)
Creatinine, Ser: 1.02 mg/dL — ABNORMAL HIGH (ref 0.57–1.00)
Globulin, Total: 3 g/dL (ref 1.5–4.5)
Glucose: 71 mg/dL (ref 70–99)
Potassium: 5.3 mmol/L — ABNORMAL HIGH (ref 3.5–5.2)
Sodium: 144 mmol/L (ref 134–144)
Total Protein: 7.7 g/dL (ref 6.0–8.5)
eGFR: 64 mL/min/{1.73_m2} (ref >59)

## 2023-01-14 LAB — TSH+FREE T4
Free T4: 1.24 ng/dL (ref 0.82–1.77)
TSH: 1.76 u[IU]/mL (ref 0.450–4.500)

## 2023-01-14 LAB — LIPID PANEL
Chol/HDL Ratio: 2.9 ratio (ref 0.0–4.4)
Cholesterol, Total: 126 mg/dL (ref 100–199)
HDL: 44 mg/dL (ref >39)
LDL Chol Calc (NIH): 60 mg/dL (ref 0–99)
Triglycerides: 120 mg/dL (ref 0–149)
VLDL Cholesterol Cal: 22 mg/dL (ref 5–40)

## 2023-01-18 ENCOUNTER — Other Ambulatory Visit (INDEPENDENT_AMBULATORY_CARE_PROVIDER_SITE_OTHER): Payer: Self-pay | Admitting: Primary Care

## 2023-01-18 ENCOUNTER — Other Ambulatory Visit: Payer: Self-pay

## 2023-01-18 DIAGNOSIS — I10 Essential (primary) hypertension: Secondary | ICD-10-CM

## 2023-01-18 DIAGNOSIS — E1165 Type 2 diabetes mellitus with hyperglycemia: Secondary | ICD-10-CM

## 2023-01-18 DIAGNOSIS — E0590 Thyrotoxicosis, unspecified without thyrotoxic crisis or storm: Secondary | ICD-10-CM

## 2023-01-18 MED ORDER — TRULICITY 3 MG/0.5ML ~~LOC~~ SOAJ
SUBCUTANEOUS | 3 refills | Status: DC
  Filled 2023-01-18 – 2023-03-04 (×2): qty 2, 28d supply, fill #0
  Filled 2023-04-08 (×2): qty 2, 28d supply, fill #1
  Filled 2023-04-29: qty 2, 28d supply, fill #2
  Filled 2023-06-02: qty 2, 28d supply, fill #3
  Filled 2023-07-07 – 2023-09-20 (×3): qty 2, 28d supply, fill #4
  Filled 2023-10-27 (×2): qty 2, 28d supply, fill #5
  Filled 2023-11-23: qty 2, 28d supply, fill #6
  Filled 2024-01-04 (×2): qty 2, 28d supply, fill #7
  Filled ????-??-??: fill #2

## 2023-01-18 MED ORDER — LOSARTAN POTASSIUM 100 MG PO TABS
ORAL_TABLET | Freq: Every day | ORAL | 1 refills | Status: DC
Start: 2023-01-18 — End: 2023-12-27
  Filled 2023-01-18: qty 90, fill #0
  Filled 2023-04-08: qty 90, 90d supply, fill #0
  Filled 2023-09-01: qty 90, 90d supply, fill #1

## 2023-01-18 MED ORDER — METHIMAZOLE 5 MG PO TABS
ORAL_TABLET | ORAL | 1 refills | Status: DC
Start: 2023-01-18 — End: 2023-08-08
  Filled 2023-01-18: qty 90, fill #0
  Filled 2023-02-17: qty 36, 84d supply, fill #0
  Filled 2023-06-02: qty 36, 84d supply, fill #1

## 2023-01-18 MED ORDER — INSULIN LISPRO (1 UNIT DIAL) 100 UNIT/ML (KWIKPEN)
10.0000 [IU] | PEN_INJECTOR | Freq: Two times a day (BID) | SUBCUTANEOUS | 3 refills | Status: DC
  Filled 2023-01-18 – 2023-04-08 (×2): qty 18, 90d supply, fill #0

## 2023-01-18 MED ORDER — METFORMIN HCL ER 500 MG PO TB24
ORAL_TABLET | ORAL | 2 refills | Status: DC
Start: 2023-01-18 — End: 2023-08-06
  Filled 2023-01-18: qty 180, fill #0
  Filled 2023-02-17: qty 180, 90d supply, fill #0
  Filled 2023-05-27: qty 180, 90d supply, fill #1

## 2023-01-18 MED ORDER — BASAGLAR KWIKPEN 100 UNIT/ML ~~LOC~~ SOPN
65.0000 [IU] | PEN_INJECTOR | Freq: Every day | SUBCUTANEOUS | 2 refills | Status: DC
Start: 2023-01-18 — End: 2023-06-02
  Filled 2023-01-18 – 2023-02-17 (×2): qty 18, 27d supply, fill #0
  Filled 2023-04-08 (×2): qty 18, 27d supply, fill #1
  Filled 2023-05-07 (×3): qty 18, 27d supply, fill #2

## 2023-01-18 MED ORDER — AMLODIPINE BESYLATE 10 MG PO TABS
10.0000 mg | ORAL_TABLET | Freq: Every day | ORAL | 1 refills | Status: DC
  Filled 2023-01-18 – 2023-04-08 (×2): qty 90, 90d supply, fill #0

## 2023-01-18 MED ORDER — ATORVASTATIN CALCIUM 40 MG PO TABS
40.0000 mg | ORAL_TABLET | Freq: Every day | ORAL | 1 refills | Status: DC
  Filled 2023-01-18 – 2023-03-04 (×2): qty 90, 90d supply, fill #0
  Filled 2023-06-02: qty 90, 90d supply, fill #1

## 2023-01-25 ENCOUNTER — Other Ambulatory Visit: Payer: Self-pay

## 2023-02-17 ENCOUNTER — Other Ambulatory Visit: Payer: Self-pay

## 2023-02-22 ENCOUNTER — Other Ambulatory Visit: Payer: Self-pay

## 2023-02-23 ENCOUNTER — Telehealth: Payer: Self-pay | Admitting: Primary Care

## 2023-02-23 NOTE — Telephone Encounter (Signed)
Meeting w/ pt tomorrow morning for financial counseling - pt needs [facility] and [facility] apps w/ req'd docs (to be discussed); will sched F/U

## 2023-02-24 ENCOUNTER — Ambulatory Visit (INDEPENDENT_AMBULATORY_CARE_PROVIDER_SITE_OTHER): Payer: Self-pay | Admitting: Primary Care

## 2023-02-24 ENCOUNTER — Encounter (INDEPENDENT_AMBULATORY_CARE_PROVIDER_SITE_OTHER): Payer: Self-pay | Admitting: Primary Care

## 2023-02-24 ENCOUNTER — Ambulatory Visit (INDEPENDENT_AMBULATORY_CARE_PROVIDER_SITE_OTHER): Payer: No Typology Code available for payment source

## 2023-02-24 ENCOUNTER — Other Ambulatory Visit (HOSPITAL_COMMUNITY)
Admission: RE | Admit: 2023-02-24 | Discharge: 2023-02-24 | Disposition: A | Discharge status: Home / Self Care | Payer: Self-pay | Source: Ambulatory Visit | Attending: Primary Care | Admitting: Primary Care

## 2023-02-24 VITALS — BP 125/81 | HR 101 | Resp 16

## 2023-02-24 DIAGNOSIS — Z124 Encounter for screening for malignant neoplasm of cervix: Secondary | ICD-10-CM

## 2023-02-24 DIAGNOSIS — Z1231 Encounter for screening mammogram for malignant neoplasm of breast: Secondary | ICD-10-CM

## 2023-02-24 NOTE — Progress Notes (Signed)
[facility]   Subjective:   Rebecca [name] is a 57 y.o. G14P0 female here for a routine annual gynecologic exam.  Current complaints: vision changes   Denies abnormal vaginal bleeding, discharge, pelvic pain, problems with intercourse or other gynecologic concerns.    Gynecologic History No LMP recorded (lmp unknown). Patient is postmenopausal. Contraception: none Last Pap: 01/16/20. Results were: normal Last mammogram: 04/24/21. Results were: normal  Health Maintenance  Topic Date Due   OPHTHALMOLOGY EXAM  Never done   Fecal DNA (Cologuard)  Never done   COVID-19 Vaccine (3 - Pfizer risk series) 02/13/2020   FOOT EXAM  11/11/2022   PAP SMEAR-Modifier  01/16/2023   INFLUENZA VACCINE  04/22/2023   MAMMOGRAM  04/25/2023   HEMOGLOBIN A1C  07/15/2023   Diabetic kidney evaluation - Urine ACR  08/18/2023   Diabetic kidney evaluation - eGFR measurement  01/13/2024   DTaP/Tdap/Td (3 - Td or Tdap) 05/13/2032   Hepatitis C Screening  Completed   HIV Screening  Completed   Zoster Vaccines- Shingrix  Completed   HPV VACCINES  Aged Out    Obstetric History OB History  Gravida Para Term Preterm AB Living  4         4  SAB IAB Ectopic Multiple Live Births          4    # Outcome Date GA Lbr Len/2nd Weight Sex Delivery Anes PTL Lv  4 Gravida           3 Gravida           2 Gravida           1 [place]             Past Medical History:  Diagnosis Date   Anemia    Diabetes mellitus    Dyslipidemia    Hypertension    Non-STEMI (non-ST elevated myocardial infarction)  Hospital)    Medical therapy for distal LAD dissection   Thyroid disease     Past Surgical History:  Procedure Laterality Date   AMPUTATION Right 07/21/2017   Procedure: RIGHT GREAT TOE AMPUTATION;  Surgeon: Nadara Mustard, MD;  Location: [facility] OR;  Service: Orthopedics;  Laterality: Right;   AMPUTATION Right 07/05/2020   Procedure: RIGHT FOOT 2ND RAY AMPUTATION;  Surgeon: Nadara Mustard, MD;   Location: [facility] OR;  Service: Orthopedics;  Laterality: Right;   CARDIAC CATHETERIZATION N/A 05/14/2015   Procedure: Left Heart Cath and Coronary Angiography;  Surgeon: Marykay Lex, MD; dLAD 60%>>95% (small vessel, ?dissection), CFX & RCA systems no sig dz, EF & LVEDP nl       Current Outpatient Medications on File Prior to Visit  Medication Sig Dispense Refill   amLODipine (NORVASC) 10 MG tablet Take 1 tablet (10 mg total) by mouth daily. 90 tablet 1   aspirin 81 MG EC tablet Take 1 tablet (81 mg total) by mouth daily. 90 tablet 1   atorvastatin (LIPITOR) 40 MG tablet Take 1 tablet (40 mg total) by mouth daily. 90 tablet 1   Dulaglutide (TRULICITY) 3 MG/0.5ML SOPN INJECT 3 MG (0.5 ML) UNDER THE SKIN ONCE A WEEK 6 mL 3   gabapentin (NEURONTIN) 400 MG capsule Take 1 capsule (400 mg total) by mouth 4 (four) times daily. 360 capsule 0   Insulin Glargine (BASAGLAR KWIKPEN) 100 UNIT/ML Inject 65 Units into the skin daily. 18 mL 2   insulin lispro (HUMALOG KWIKPEN) 100 UNIT/ML KwikPen Inject 10 Units into the skin  2 (two) times daily before lunch and supper. 18 mL 3   Insulin Pen Needle 31G X 8 MM MISC Inject 1 each into the skin at bedtime. 90 each 1   losartan (COZAAR) 100 MG tablet TAKE 1 TABLET (100 MG TOTAL) BY MOUTH DAILY. 90 tablet 1   metFORMIN (GLUCOPHAGE-XR) 500 MG 24 hr tablet TAKE 1 TABLET (500 MG TOTAL) BY MOUTH 2 (TWO) TIMES DAILY AFTER A MEAL. 180 tablet 2   methimazole (TAPAZOLE) 5 MG tablet TAKE 1 TABLET (5 MG TOTAL) BY MOUTH EVERY MONDAY, WEDNESDAY, AND FRIDAY. 90 tablet 1   No current facility-administered medications on file prior to visit.    No Known Allergies  Social History   Socioeconomic History   Marital status: Married    Spouse name: Not on file   Number of children: Not on file   Years of education: Not on file   Highest education level: Not on file  Occupational History   Occupation: unemployed  Tobacco Use   Smoking status: Never   Smokeless tobacco:  Never  Vaping Use   Vaping Use: Never used  Substance and Sexual Activity   Alcohol use: No   Drug use: No   Sexual activity: Yes  Other Topics Concern   Not on file  Social History Narrative   Not on file   Social Determinants of Health   Financial Resource Strain: Not on file  Food Insecurity: Not on file  Transportation Needs: Not on file  Physical Activity: Not on file  Stress: Not on file  Social Connections: Not on file  Intimate Partner Violence: Not on file    Family History  Problem Relation Age of Onset   Cancer Mother    Hypertension Father    Hyperlipidemia Father    Diabetes Sister    Diabetes Sister    Diabetes Sister    Diabetes Sister    Diabetes Brother    Diabetes Brother    Diabetes Brother    Diabetes Brother    Diabetes Brother    Breast cancer Neg Hx     The following portions of the patient's history were reviewed and updated as appropriate: allergies, current medications, past family history, past medical history, past social history, past surgical history and problem list.  Review of Systems Pertinent items noted in HPI and remainder of comprehensive ROS otherwise negative.   Objective:  Last Menstrual Period  (LMP Unknown)  CONSTITUTIONAL: Well-developed, well-nourished female in no acute distress.  HENT:  Normocephalic, atraumatic, External right and left ear normal. Oropharynx is clear and moist EYES: Conjunctivae and EOM are normal. Pupils are equal, round, and reactive to light. No scleral icterus.  NECK: Normal range of motion, supple, no masses.  Normal thyroid.  SKIN: Skin is warm and dry. No rash noted. Not diaphoretic. No erythema. No pallor. NEUROLGIC: Alert and oriented to person, place, and time. Normal reflexes, muscle tone coordination. No cranial nerve deficit noted. PSYCHIATRIC: Normal mood and affect. Normal behavior. Normal judgment and thought content. CARDIOVASCULAR: Normal heart rate noted, regular  rhythm RESPIRATORY: Clear to auscultation bilaterally. Effort and breath sounds normal, no problems with respiration noted. BREASTS: Symmetric in size. No masses, skin changes, nipple drainage, or lymphadenopathy. Taught self breast exam and had patient to demonstrate SBE. ABDOMEN: Soft, normal bowel sounds, no distention noted.  No tenderness, rebound or guarding.  PELVIC: Normal appearing external genitalia; normal appearing vaginal mucosa and cervix.  No abnormal discharge noted.  Pap smear obtained.  Normal uterine size, no other palpable masses, no uterine or adnexal tenderness. MUSCULOSKELETAL: Normal range of motion. No tenderness.  No cyanosis, clubbing, or edema.  2+ distal pulses.  Assessment:  Annual gynecologic examination with pap smear  Diagnoses and all orders for this visit:  Cervical cancer screening -     Cytology - PAP -     Cervicovaginal ancillary only  Breast cancer screening by mammogram -     MM DIGITAL SCREENING BILATERAL; Future    Plan:   Will follow up results of pap smear and manage accordingly. Mammogram scheduled Routine preventative health maintenance measures emphasized. Please refer to After Visit Summary for other counseling recommendations.   This note has been created with Education officer, environmental. Any transcriptional errors are unintentional.   Grayce Sessions, NP 02/24/2023, 10:52 AM

## 2023-02-25 ENCOUNTER — Telehealth: Payer: Self-pay

## 2023-02-25 NOTE — Telephone Encounter (Signed)
Patient's daughter, Bo Merino [phone], called and stated that patient had a check-up (physical) with her doctor and also met with a financial counselor at the appointment on 02/24/2023, and patient is confused, not sure why she was told to call our office. Patient's daughter informed, needs to contact [facility] to discuss the upcoming appointment on 03/05/2023 with financial counselor. Patient's information given to scheduler.

## 2023-02-26 LAB — CERVICOVAGINAL ANCILLARY ONLY
Bacterial Vaginitis (gardnerella): NEGATIVE
Candida Glabrata: NEGATIVE
Candida Vaginitis: NEGATIVE
Chlamydia: NEGATIVE
Comment: NEGATIVE
Comment: NEGATIVE
Comment: NEGATIVE
Comment: NEGATIVE
Comment: NEGATIVE
Comment: NORMAL
Neisseria Gonorrhea: NEGATIVE
Trichomonas: NEGATIVE

## 2023-02-26 LAB — CYTOLOGY - PAP: Diagnosis: NEGATIVE

## 2023-03-04 ENCOUNTER — Other Ambulatory Visit: Payer: Self-pay

## 2023-03-05 ENCOUNTER — Ambulatory Visit (INDEPENDENT_AMBULATORY_CARE_PROVIDER_SITE_OTHER): Payer: No Typology Code available for payment source

## 2023-03-18 ENCOUNTER — Ambulatory Visit (INDEPENDENT_AMBULATORY_CARE_PROVIDER_SITE_OTHER): Payer: Self-pay

## 2023-03-18 NOTE — Telephone Encounter (Signed)
    Used Production designer, theatre/television/film # [id number]. Chief Complaint: Right eye pain, watering, blurry vision. States has talked to PCP about this asking for eye drops. Declines OV or Mobile Unit. Symptoms: Above Frequency: 3 weeks Pertinent Negatives: Patient denies fever Disposition: [] ED /[] Urgent Care (no appt availability in office) / [x] Appointment(In office/virtual)/ []  [place] [facility]/ [] Home Care/ [x] Refused Recommended Disposition /[] [place] [facility]/ []  Follow-up with PCP Additional Notes: Please advise pt.  Reason for Disposition  Eye pain present > 24 hours  Answer Assessment - Initial Assessment Questions 1. ONSET: "When did the pain start?" (e.g., minutes, hours, days)     Several weeks 2. TIMING: "Does the pain come and go, or has it been constant since it started?" (e.g., constant, intermittent, fleeting)     Comes and goes 3. SEVERITY: "How bad is the pain?"   (Scale 1-10; mild, moderate or severe)   - MILD (1-3): doesn't interfere with normal activities    - MODERATE (4-7): interferes with normal activities or awakens from sleep    - SEVERE (8-10): excruciating pain and patient unable to do normal activities     Now - mild now 4. LOCATION: "Where does it hurt?"  (e.g., eyelid, eye, cheekbone)     Right eye 5. CAUSE: "What do you think is causing the pain?"     Unsure 6. VISION: "Do you have blurred vision or changes in your vision?"      Blurry 7. EYE DISCHARGE: "Is there any discharge (pus) from the eye(s)?"  If Yes, ask: "What color is it?"      Watery 8. FEVER: "Do you have a fever?" If Yes, ask: "What is it, how was it measured, and when did it start?"      No 9. OTHER SYMPTOMS: "Do you have any other symptoms?" (e.g., headache, nasal discharge, facial rash)     Headache 10. PREGNANCY: "Is there any chance you are pregnant?" "When was your last menstrual period?"       No  Protocols used: Eye Pain and Other Symptoms-A-AH

## 2023-03-24 ENCOUNTER — Ambulatory Visit (INDEPENDENT_AMBULATORY_CARE_PROVIDER_SITE_OTHER): Payer: Self-pay

## 2023-03-24 NOTE — Telephone Encounter (Signed)
Will forward to provider  

## 2023-03-24 NOTE — Telephone Encounter (Signed)
Interpreter Francis Dowse  [phone] Chief Complaint: tooth pain Symptoms: lower molar on both sides pain, Head and ear pain on L side  Frequency: 3 days  Pertinent Negatives: NA Disposition: [] ED /[] Urgent Care (no appt availability in office) / [] Appointment(In office/virtual)/ []  [facility]/ [] Home Care/ [x] Refused Recommended Disposition /[] [facility]/ []  Follow-up with PCP Additional Notes: pt calling requesting medication for tooth pain until she can see dentist in August. Advised pt would have to be seen and pt got upset. Offered 1st appt for 04/05/23 and recommended I could schedule UC appt as well for today or tomorrow depending on what is available. Pt refused and said she would just go to the [place] store and get medication until she can get to dentist. Offered to schedule appt again but pt disconnected line.   Reason for Disposition  Toothache present > 24 hours  Answer Assessment - Initial Assessment Questions 1. LOCATION: "Which tooth is hurting?"  (e.g., right-side/left-side, upper/lower, front/back)     Lower on both sides  2. ONSET: "When did the toothache start?"  (e.g., hours, days)      3 days  3. SEVERITY: "How bad is the toothache?"  (Scale 1-10; mild, moderate or severe)   - MILD (1-3): doesn't interfere with chewing    - MODERATE (4-7): interferes with chewing, interferes with normal activities, awakens from sleep     - SEVERE (8-10): unable to eat, unable to do any normal activities, excruciating pain        Feels like having a pebble in your shoe 4. SWELLING: "Is there any visible swelling of your face?"     no 5. OTHER SYMPTOMS: "Do you have any other symptoms?" (e.g., fever)     Head and ear pain on L side  Protocols used: Toothache-A-AH

## 2023-03-26 NOTE — Telephone Encounter (Signed)
Tried contacting pt and phone kept ringing and received a message stating call can't completed at this time

## 2023-04-08 ENCOUNTER — Other Ambulatory Visit (INDEPENDENT_AMBULATORY_CARE_PROVIDER_SITE_OTHER): Payer: Self-pay | Admitting: Primary Care

## 2023-04-08 ENCOUNTER — Other Ambulatory Visit: Payer: Self-pay

## 2023-04-08 DIAGNOSIS — E1165 Type 2 diabetes mellitus with hyperglycemia: Secondary | ICD-10-CM

## 2023-04-08 MED ORDER — GABAPENTIN 400 MG PO CAPS
400.0000 mg | ORAL_CAPSULE | Freq: Four times a day (QID) | ORAL | 0 refills | Status: DC
Start: 2023-04-08 — End: 2023-07-29
  Filled 2023-04-08: qty 120, 30d supply, fill #0
  Filled 2023-05-20: qty 120, 30d supply, fill #1
  Filled 2023-06-22: qty 120, 30d supply, fill #2

## 2023-04-09 ENCOUNTER — Other Ambulatory Visit: Payer: Self-pay

## 2023-04-20 ENCOUNTER — Ambulatory Visit (INDEPENDENT_AMBULATORY_CARE_PROVIDER_SITE_OTHER): Payer: No Typology Code available for payment source | Admitting: Primary Care

## 2023-04-29 ENCOUNTER — Other Ambulatory Visit (HOSPITAL_COMMUNITY)
Admission: RE | Admit: 2023-04-29 | Discharge: 2023-04-29 | Disposition: A | Discharge status: Home / Self Care | Payer: Self-pay | Source: Ambulatory Visit | Attending: Primary Care | Admitting: Primary Care

## 2023-04-29 ENCOUNTER — Encounter (INDEPENDENT_AMBULATORY_CARE_PROVIDER_SITE_OTHER): Payer: Self-pay | Admitting: Primary Care

## 2023-04-29 ENCOUNTER — Other Ambulatory Visit: Payer: Self-pay

## 2023-04-29 ENCOUNTER — Ambulatory Visit (INDEPENDENT_AMBULATORY_CARE_PROVIDER_SITE_OTHER): Payer: Self-pay | Admitting: Primary Care

## 2023-04-29 VITALS — BP 144/96 | HR 92 | Resp 16 | Wt 180.0 lb

## 2023-04-29 DIAGNOSIS — Z794 Long term (current) use of insulin: Secondary | ICD-10-CM

## 2023-04-29 DIAGNOSIS — N898 Other specified noninflammatory disorders of vagina: Secondary | ICD-10-CM | POA: Insufficient documentation

## 2023-04-29 DIAGNOSIS — I10 Essential (primary) hypertension: Secondary | ICD-10-CM

## 2023-04-29 DIAGNOSIS — E1129 Type 2 diabetes mellitus with other diabetic kidney complication: Secondary | ICD-10-CM

## 2023-04-29 DIAGNOSIS — E119 Type 2 diabetes mellitus without complications: Secondary | ICD-10-CM

## 2023-04-29 DIAGNOSIS — Z1211 Encounter for screening for malignant neoplasm of colon: Secondary | ICD-10-CM

## 2023-04-29 DIAGNOSIS — E6609 Other obesity due to excess calories: Secondary | ICD-10-CM

## 2023-04-29 DIAGNOSIS — E0590 Thyrotoxicosis, unspecified without thyrotoxic crisis or storm: Secondary | ICD-10-CM

## 2023-04-29 DIAGNOSIS — F5102 Adjustment insomnia: Secondary | ICD-10-CM

## 2023-04-29 DIAGNOSIS — Z7985 Long-term (current) use of injectable non-insulin antidiabetic drugs: Secondary | ICD-10-CM

## 2023-04-29 DIAGNOSIS — Z7984 Long term (current) use of oral hypoglycemic drugs: Secondary | ICD-10-CM

## 2023-04-29 DIAGNOSIS — Z6831 Body mass index (BMI) 31.0-31.9, adult: Secondary | ICD-10-CM

## 2023-04-29 LAB — POCT URINALYSIS DIP (CLINITEK)
Bilirubin, UA: NEGATIVE
Glucose, UA: 100 mg/dL — AB
Ketones, POC UA: NEGATIVE mg/dL
Leukocytes, UA: NEGATIVE
Nitrite, UA: NEGATIVE
POC PROTEIN,UA: 100 — AB
Spec Grav, UA: 1.02 (ref 1.010–1.025)
Urobilinogen, UA: 0.2 E.U./dL
pH, UA: 6.5 (ref 5.0–8.0)

## 2023-04-29 LAB — POCT GLYCOSYLATED HEMOGLOBIN (HGB A1C): HbA1c, POC (controlled diabetic range): 8.2 % — AB (ref 0.0–7.0)

## 2023-04-29 MED ORDER — TRAZODONE HCL 50 MG PO TABS
25.0000 mg | ORAL_TABLET | Freq: Every evening | ORAL | 3 refills | Status: DC | PRN
Start: 2023-04-29 — End: 2024-02-03
  Filled 2023-04-29: qty 30, 30d supply, fill #0

## 2023-04-29 NOTE — Progress Notes (Signed)
[facility]  Rebecca [name], is a 57 y.o. female  [id number]  [id number]:[id number]  DOB - Aug 09, 1966  : DM/HTN      Subjective:   Rebecca [name] is a 58 y.o.Hispanic  female (interpreter [place] [phone] ) here today for a follow up visit for the management of HTN. Patient has No headache, No chest pain, No abdominal pain - No Nausea, No new weakness tingling or numbness, No Cough - shortness of breath. Also, T2D- Denies polyuria, polydipsia, polyphasia or vision changes.  Does not check blood sugars at home. She was sad ask what was wrong only said noting as tears feel down her face. Question again she lost her younger brother in [place] and was not able to help only saw him on the phone. She is unable to sleep. She has been out of Trulicity something about unable to receive now can pick it up.   No problems updated.  No Known Allergies  Past Medical History:  Diagnosis Date   Anemia    Diabetes mellitus    Dyslipidemia    Hypertension    Non-STEMI (non-ST elevated myocardial infarction) [facility])    Medical therapy for distal LAD dissection   Thyroid disease     Current Outpatient Medications on File Prior to Visit  Medication Sig Dispense Refill   amLODipine (NORVASC) 10 MG tablet Take 1 tablet (10 mg total) by mouth daily. 90 tablet 1   aspirin 81 MG EC tablet Take 1 tablet (81 mg total) by mouth daily. 90 tablet 1   atorvastatin (LIPITOR) 40 MG tablet Take 1 tablet (40 mg total) by mouth daily. 90 tablet 1   Dulaglutide (TRULICITY) 3 MG/0.5ML SOPN INJECT 3 MG (0.5 ML) UNDER THE SKIN ONCE A WEEK 6 mL 3   gabapentin (NEURONTIN) 400 MG capsule Take 1 capsule (400 mg total) by mouth 4 (four) times daily. 360 capsule 0   Insulin Glargine (BASAGLAR KWIKPEN) 100 UNIT/ML Inject 65 Units into the skin daily. 18 mL 2   insulin lispro (HUMALOG KWIKPEN) 100 UNIT/ML KwikPen Inject 10 Units into the skin 2 (two) times daily before lunch and supper. 18 mL 3    Insulin Pen Needle 31G X 8 MM MISC Inject 1 each into the skin at bedtime. 90 each 1   losartan (COZAAR) 100 MG tablet TAKE 1 TABLET (100 MG TOTAL) BY MOUTH DAILY. 90 tablet 1   metFORMIN (GLUCOPHAGE-XR) 500 MG 24 hr tablet TAKE 1 TABLET (500 MG TOTAL) BY MOUTH 2 (TWO) TIMES DAILY AFTER A MEAL. 180 tablet 2   methimazole (TAPAZOLE) 5 MG tablet TAKE 1 TABLET (5 MG TOTAL) BY MOUTH EVERY MONDAY, WEDNESDAY, AND FRIDAY. 90 tablet 1   No current facility-administered medications on file prior to visit.    Objective:   Vitals:   04/29/23 0909 04/29/23 0910 04/29/23 0943  BP: (Abnormal) 170/102 (Abnormal) 166/107 (Abnormal) 144/96  Pulse: 92    Resp: 16    SpO2: 100%    Weight: 180 lb (81.6 kg)      Comprehensive ROS Pertinent positive and negative noted in HPI   Exam General appearance : Awake, alert, not in any distress. Speech Clear. Not toxic looking HEENT: Atraumatic and Normocephalic, pupils equally reactive to light and accomodation Neck: Supple, no JVD. No cervical lymphadenopathy.  Chest: Good air entry bilaterally, no added sounds  CVS: S1 S2 regular, no murmurs.  Abdomen: Bowel sounds present, Non tender and not distended with no gaurding, rigidity or rebound. Extremities: B/L Lower  Ext shows no edema, both legs are warm to touch Neurology: Awake alert, and oriented X 3, CN II-XII intact, Non focal Skin: No Rash  Data Review Lab Results  Component Value Date   HGBA1C 8.2 (A) 04/29/2023   HGBA1C 8.0 (A) 01/13/2023   HGBA1C 7.3 (A) 08/17/2022    Assessment & Plan  Rebecca [name] was seen today for diabetes and hypertension.  Diagnoses and all orders for this visit:  Essential hypertension -     CMP14+EGFR  Class 1 obesity due to excess calories without serious comorbidity with body mass index (BMI) of 31.0 to 31.9 in adult Obesity is 30-39 indicating an excess in caloric intake or underlining conditions. This may lead to other co-morbidities. Educated on lifestyle  modifications of diet and exercise which may reduce obesity.    Encounter for diabetic foot exam (HCC) Completed   Type 2 diabetes mellitus with other diabetic kidney complication, with long-term current use of insulin (HCC) Out of Trulicity medication will be mailed.  - educated on lifestyle modifications, including but not limited to diet choices and adding exercise to daily routine.   -     POCT A1C 8.3  Colon cancer screening -     Fecal occult blood, imunochemical; Future  Hyperthyroidism -     TSH + free T4  Vaginal discharge 2/2  Vaginal itching -     POCT URINALYSIS DIP (CLINITEK) -     Cervicovaginal ancillary only  Adjustment insomnia her younger brother in [place] passed exactly 2 months from today and was not able to help only saw him on the phone. -     traZODone (DESYREL) 50 MG tablet; Take 0.5-1 tablets (25-50 mg total) by mouth at bedtime as needed for sleep.    Patient have been counseled extensively about nutrition and exercise. Other issues discussed during this visit include: low cholesterol diet, weight control and daily exercise, foot care, annual eye examinations at Ophthalmology, importance of adherence with medications and regular follow-up. We also discussed long term complications of uncontrolled diabetes and hypertension.   Return in about 3 months (around 07/30/2023) for fasting .  The patient was given clear instructions to go to ER or return to medical center if symptoms don't improve, worsen or new problems develop. The patient verbalized understanding. The patient was told to call to get lab results if they haven't heard anything in the next week.   This note has been created with Education officer, environmental. Any transcriptional errors are unintentional.   Grayce Sessions, NP 04/29/2023, 10:14 AM

## 2023-04-29 NOTE — Patient Instructions (Signed)
Please contact office in 6-8 weeks for up date on Trazodone for sleep .Trazodone Extended-Release Tablets What is this medication? TRAZODONE (TRAZ oh done) treats depression. It increases the amount of serotonin in the brain, a hormone that helps regulate mood. This medicine may be used for other purposes; ask your health care provider or pharmacist if you have questions. COMMON BRAND NAME(S): Oleptro What should I tell my care team before I take this medication? They need to know if you have any of these conditions: Bipolar disorder Bleeding disorder Glaucoma Heart disease, or previous heart attack Irregular heartbeat or rhythm Kidney disease Liver disease Low levels of sodium in the blood Suicidal thoughts, plans, or attempt by you or a family member An unusual or allergic reaction to trazodone, other medications, foods, dyes, or preservatives Pregnant or trying to get pregnant Breastfeeding How should I use this medication? Take this medication by mouth with a glass of water. Take it as directed on the prescription label. Take it on an empty stomach, at least 30 minutes before or 2 hours after food. Do not take with food. Do not cut, crush, or chew this medication. You may break in half along the score line. Take your medication at bedtime every day. Do not take your medication more often than directed. Keep taking this medication unless your care team tells you to stop. Stopping it too quickly can cause serious side effects. It can also make your condition worse. A special MedGuide will be given to you by the pharmacist with each prescription and refill. Be sure to read this information carefully each time. Talk to your care team about the use of this medication in children. Special care may be needed. Overdosage: If you think you have taken too much of this medicine contact a poison control center or emergency room at once. NOTE: This medicine is only for you. Do not share this medicine  with others. What if I miss a dose? If you miss a dose, take it as soon as you can. If it is almost time for your next dose, take only that dose. Do not take double or extra doses. What may interact with this medication? Do not take this medication with any of the following: Certain medications for fungal infections, such as fluconazole, itraconazole, ketoconazole, posaconazole, voriconazole Cisapride Dronedarone Linezolid MAOIs, such as Carbex, Eldepryl, Marplan, Nardil, and Parnate Mesoridazine Methylene blue (injected into a vein) Pimozide Saquinavir Thioridazine This medication may also interact with the following: Alcohol Antiviral medications for HIV or AIDS Aspirin and aspirin-like medications Barbiturates, such as phenobarbital Certain medications for blood pressure, heart disease, irregular heart beat Certain medications for mental health conditions Certain medications for migraine headache, such as almotriptan, eletriptan, frovatriptan, naratriptan, rizatriptan, sumatriptan, zolmitriptan Certain medications for seizures, such as carbamazepine and phenytoin Certain medications for sleep Certain medications that treat or prevent blood clots, such as dalteparin, enoxaparin, warfarin Digoxin Fentanyl Lithium NSAIDS, medications for pain and inflammation, such as ibuprofen or naproxen Other medications that cause heart rhythm changes Rasagiline Supplements, such as St. John's wort, kava kava, valerian Tramadol Tryptophan This list may not describe all possible interactions. Give your health care provider a list of all the medicines, herbs, non-prescription drugs, or dietary supplements you use. Also tell them if you smoke, drink alcohol, or use illegal drugs. Some items may interact with your medicine. What should I watch for while using this medication? Visit your care team for regular checks on your progress. Tell your care team if  your symptoms do not start to get better  or if they get worse. Because it may take several weeks to see the full effects of this medication, it is important to continue your treatment as prescribed by your care team. Watch for new or worsening thoughts of suicide or depression. This includes sudden changes in mood, behaviors, or thoughts. These changes can happen at any time but are more common in the beginning of treatment or after a change in dose. Call your care team right away if you experience these thoughts or worsening depression. This medication may cause mood and behavior changes, such as anxiety, nervousness, irritability, hostility, restlessness, excitability, hyperactivity, or trouble sleeping. These changes can happen at any time but are more common in the beginning of treatment or after a change in dose. Call your care team right away if you notice any of these symptoms. This medication may affect your coordination, reaction time, or judgment. Do not drive or operate machinery until you know how this medication affects you. Sit up or stand slowly to reduce the risk of dizzy or fainting spells. Drinking alcohol with this medication can increase the risk of these side effects. This medication may cause dry eyes and blurred vision. If you wear contact lenses you may feel some discomfort. Lubricating drops may help. See your care team if the problem does not go away or is severe. Your mouth may get dry. Chewing sugarless gum or sucking hard candy and drinking plenty of water may help. Contact your care team if the problem does not go away or is severe. What side effects may I notice from receiving this medication? Side effects that you should report to your care team as soon as possible: Allergic reactions--skin rash, itching, hives, swelling of the face, lips, tongue, or throat Bleeding--bloody or black, tar-like stools, red or dark brown urine, vomiting blood or brown material that looks like coffee grounds, small, red or purple spots  on skin, unusual bleeding or bruising Heart rhythm changes--fast or irregular heartbeat, dizziness, feeling faint or lightheaded, chest pain, trouble breathing Low blood pressure--dizziness, feeling faint or lightheaded, blurry vision Low sodium level--muscle weakness, fatigue, dizziness, headache, confusion Prolonged or painful erection Serotonin syndrome--irritability, confusion, fast or irregular heartbeat, muscle stiffness, twitching muscles, sweating, high fever, seizures, chills, vomiting, diarrhea Sudden eye pain or change in vision such as blurry vision, seeing halos around lights, vision loss Thoughts of suicide or self-harm, worsening mood, feelings of depression Side effects that usually do not require medical attention (report to your care team if they continue or are bothersome): Change in sex drive or performance Constipation Dizziness Drowsiness Dry mouth This list may not describe all possible side effects. Call your doctor for medical advice about side effects. You may report side effects to FDA at 1-800-FDA-1088. Where should I keep my medication? Keep out of the reach of children and pets. Store at room temperature between 15 and 30 degrees C (59 to 86 degrees F). Protect from light. Keep container tightly closed. Throw away any unused medication after the expiration date. NOTE: This sheet is a summary. It may not cover all possible information. If you have questions about this medicine, talk to your doctor, pharmacist, or health care provider.  2024 [employer]vier/Gold Standard (2022-09-03 00:00:00)

## 2023-05-07 ENCOUNTER — Other Ambulatory Visit: Payer: Self-pay

## 2023-05-20 ENCOUNTER — Other Ambulatory Visit: Payer: Self-pay

## 2023-05-27 ENCOUNTER — Other Ambulatory Visit (HOSPITAL_COMMUNITY): Payer: Self-pay

## 2023-06-02 ENCOUNTER — Other Ambulatory Visit (INDEPENDENT_AMBULATORY_CARE_PROVIDER_SITE_OTHER): Payer: Self-pay | Admitting: Primary Care

## 2023-06-02 DIAGNOSIS — E1165 Type 2 diabetes mellitus with hyperglycemia: Secondary | ICD-10-CM

## 2023-06-03 ENCOUNTER — Other Ambulatory Visit: Payer: Self-pay

## 2023-06-03 NOTE — Telephone Encounter (Signed)
Will forward to provider  

## 2023-06-04 ENCOUNTER — Other Ambulatory Visit: Payer: Self-pay

## 2023-06-04 MED ORDER — BASAGLAR KWIKPEN 100 UNIT/ML ~~LOC~~ SOPN
65.0000 [IU] | PEN_INJECTOR | Freq: Every day | SUBCUTANEOUS | 2 refills | Status: DC
Start: 2023-06-04 — End: 2023-10-07
  Filled 2023-06-04: qty 18, 27d supply, fill #0
  Filled 2023-07-26 (×2): qty 18, 27d supply, fill #1
  Filled 2023-09-01: qty 18, 27d supply, fill #2

## 2023-06-10 ENCOUNTER — Other Ambulatory Visit: Payer: Self-pay

## 2023-06-21 ENCOUNTER — Ambulatory Visit (INDEPENDENT_AMBULATORY_CARE_PROVIDER_SITE_OTHER): Payer: Self-pay

## 2023-06-21 NOTE — Telephone Encounter (Signed)
Nurse visit scheduled for tomorrow, 10/01

## 2023-06-21 NOTE — Telephone Encounter (Signed)
Please contact pt and schedule a nurse visit for vaginal swab

## 2023-06-21 NOTE — Telephone Encounter (Signed)
Summary: vaginal itch   Patient sttd she is having vaginal itch and pain and need to know what I can take for the symptoms. Please f/u with patient       Chief Complaint: Vaginal pain, itching, some clear discharge. Symptoms: Above Frequency: 5 days ago Pertinent Negatives: Patient denies  Disposition: [] ED /[] Urgent Care (no appt availability in office) / [] Appointment(In office/virtual)/ []  [facility]/ [] Home Care/ [x] Refused Recommended Disposition /[] [facility]/ []  Follow-up with PCP Additional Notes: States she will try OTC .  Reason for Disposition  [1] Symptoms of a yeast infection (i.e., itchy, white discharge, not bad smelling) AND [2] not improved > 3 days following Care Advice  [1] Symptoms of a yeast infection (i.e., itchy, white discharge, not bad smelling) AND [2] feels like prior vaginal yeast infections  Answer Assessment - Initial Assessment Questions 1. SYMPTOM: "What's the main symptom you're concerned about?" (e.g., pain, itching, dryness)     Pain, itching 2. LOCATION: "Where is the   located?" (e.g., inside/outside, left/right)     External 3. ONSET: "When did the    start?"     5 days ago 4. PAIN: "Is there any pain?" If Yes, ask: "How bad is it?" (Scale: 1-10; mild, moderate, severe)   -  MILD (1-3): Doesn't interfere with normal activities.    -  MODERATE (4-7): Interferes with normal activities (e.g., work or school) or awakens from sleep.     -  SEVERE (8-10): Excruciating pain, unable to do any normal activities.     2 5. ITCHING: "Is there any itching?" If Yes, ask: "How bad is it?" (Scale: 1-10; mild, moderate, severe)     Burning 6. CAUSE: "What do you think is causing the discharge?" "Have you had the same problem before? What happened then?"     Unsure 7. OTHER SYMPTOMS: "Do you have any other symptoms?" (e.g., fever, itching, vaginal bleeding, pain with urination, injury to genital area, vaginal foreign body)     No 8.  PREGNANCY: "Is there any chance you are pregnant?" "When was your last menstrual period?"     No  Protocols used: Vaginal Symptoms-A-AH

## 2023-06-22 ENCOUNTER — Ambulatory Visit (INDEPENDENT_AMBULATORY_CARE_PROVIDER_SITE_OTHER): Payer: No Typology Code available for payment source

## 2023-06-22 ENCOUNTER — Other Ambulatory Visit (HOSPITAL_COMMUNITY)
Admission: RE | Admit: 2023-06-22 | Discharge: 2023-06-22 | Disposition: A | Discharge status: Home / Self Care | Payer: Self-pay | Source: Ambulatory Visit | Attending: Primary Care | Admitting: Primary Care

## 2023-06-22 ENCOUNTER — Other Ambulatory Visit: Payer: Self-pay

## 2023-06-22 DIAGNOSIS — N898 Other specified noninflammatory disorders of vagina: Secondary | ICD-10-CM | POA: Insufficient documentation

## 2023-06-25 ENCOUNTER — Other Ambulatory Visit: Payer: Self-pay | Admitting: Primary Care

## 2023-06-25 DIAGNOSIS — Z1211 Encounter for screening for malignant neoplasm of colon: Secondary | ICD-10-CM

## 2023-06-25 DIAGNOSIS — Z1212 Encounter for screening for malignant neoplasm of rectum: Secondary | ICD-10-CM

## 2023-06-28 LAB — CERVICOVAGINAL ANCILLARY ONLY
Bacterial Vaginitis (gardnerella): NEGATIVE
Candida Glabrata: NEGATIVE
Candida Vaginitis: NEGATIVE
Comment: NEGATIVE
Comment: NEGATIVE
Comment: NEGATIVE
Comment: NEGATIVE
Comment: NEGATIVE
Comment: NORMAL
Trichomonas: NEGATIVE

## 2023-07-08 ENCOUNTER — Other Ambulatory Visit: Payer: Self-pay

## 2023-07-15 ENCOUNTER — Ambulatory Visit (INDEPENDENT_AMBULATORY_CARE_PROVIDER_SITE_OTHER): Payer: Self-pay | Admitting: Primary Care

## 2023-07-19 ENCOUNTER — Encounter (HOSPITAL_COMMUNITY): Payer: Self-pay | Admitting: Emergency Medicine

## 2023-07-19 ENCOUNTER — Emergency Department (HOSPITAL_COMMUNITY)
Admission: EM | Admit: 2023-07-19 | Discharge: 2023-07-20 | Disposition: A | Discharge status: Home / Self Care | Payer: Self-pay | Attending: Emergency Medicine | Admitting: Emergency Medicine

## 2023-07-19 ENCOUNTER — Ambulatory Visit (INDEPENDENT_AMBULATORY_CARE_PROVIDER_SITE_OTHER): Payer: Self-pay | Admitting: *Deleted

## 2023-07-19 ENCOUNTER — Other Ambulatory Visit: Payer: Self-pay

## 2023-07-19 DIAGNOSIS — Z7982 Long term (current) use of aspirin: Secondary | ICD-10-CM | POA: Insufficient documentation

## 2023-07-19 DIAGNOSIS — R0609 Other forms of dyspnea: Secondary | ICD-10-CM | POA: Insufficient documentation

## 2023-07-19 DIAGNOSIS — E119 Type 2 diabetes mellitus without complications: Secondary | ICD-10-CM | POA: Insufficient documentation

## 2023-07-19 DIAGNOSIS — Z794 Long term (current) use of insulin: Secondary | ICD-10-CM | POA: Insufficient documentation

## 2023-07-19 DIAGNOSIS — Z7984 Long term (current) use of oral hypoglycemic drugs: Secondary | ICD-10-CM | POA: Insufficient documentation

## 2023-07-19 DIAGNOSIS — I2510 Atherosclerotic heart disease of native coronary artery without angina pectoris: Secondary | ICD-10-CM | POA: Insufficient documentation

## 2023-07-19 DIAGNOSIS — Z79899 Other long term (current) drug therapy: Secondary | ICD-10-CM | POA: Insufficient documentation

## 2023-07-19 DIAGNOSIS — E039 Hypothyroidism, unspecified: Secondary | ICD-10-CM | POA: Insufficient documentation

## 2023-07-19 DIAGNOSIS — I10 Essential (primary) hypertension: Secondary | ICD-10-CM | POA: Insufficient documentation

## 2023-07-19 DIAGNOSIS — R0781 Pleurodynia: Secondary | ICD-10-CM | POA: Insufficient documentation

## 2023-07-19 DIAGNOSIS — R109 Unspecified abdominal pain: Secondary | ICD-10-CM | POA: Insufficient documentation

## 2023-07-19 LAB — BASIC METABOLIC PANEL
Anion gap: 10 (ref 5–15)
BUN: 28 mg/dL — ABNORMAL HIGH (ref 6–20)
CO2: 23 mmol/L (ref 22–32)
Calcium: 9.2 mg/dL (ref 8.9–10.3)
Chloride: 104 mmol/L (ref 98–111)
Creatinine, Ser: 1.85 mg/dL — ABNORMAL HIGH (ref 0.44–1.00)
GFR, Estimated: 31 mL/min — ABNORMAL LOW (ref >60)
Glucose, Bld: 158 mg/dL — ABNORMAL HIGH (ref 70–99)
Potassium: 4.5 mmol/L (ref 3.5–5.1)
Sodium: 137 mmol/L (ref 135–145)

## 2023-07-19 LAB — URINALYSIS, ROUTINE W REFLEX MICROSCOPIC
Bilirubin Urine: NEGATIVE
Glucose, UA: NEGATIVE mg/dL
Hgb urine dipstick: NEGATIVE
Ketones, ur: NEGATIVE mg/dL
Nitrite: NEGATIVE
Protein, ur: 30 mg/dL — AB
Specific Gravity, Urine: 1.016 (ref 1.005–1.030)
pH: 5 (ref 5.0–8.0)

## 2023-07-19 LAB — CBC
HCT: 33.8 % — ABNORMAL LOW (ref 36.0–46.0)
Hemoglobin: 10.7 g/dL — ABNORMAL LOW (ref 12.0–15.0)
MCH: 26.7 pg (ref 26.0–34.0)
MCHC: 31.7 g/dL (ref 30.0–36.0)
MCV: 84.3 fL (ref 80.0–100.0)
Platelets: 264 10*3/uL (ref 150–400)
RBC: 4.01 MIL/uL (ref 3.87–5.11)
RDW: 12.9 % (ref 11.5–15.5)
WBC: 13.5 10*3/uL — ABNORMAL HIGH (ref 4.0–10.5)
nRBC: 0 % (ref 0.0–0.2)

## 2023-07-19 LAB — LIPASE, BLOOD: Lipase: 32 U/L (ref 11–51)

## 2023-07-19 LAB — HEPATIC FUNCTION PANEL
ALT: 24 U/L (ref 0–44)
AST: 21 U/L (ref 15–41)
Albumin: 3.9 g/dL (ref 3.5–5.0)
Alkaline Phosphatase: 98 U/L (ref 38–126)
Bilirubin, Direct: 0.2 mg/dL (ref 0.0–0.2)
Indirect Bilirubin: 0.9 mg/dL (ref 0.3–0.9)
Total Bilirubin: 1.1 mg/dL (ref 0.3–1.2)
Total Protein: 7.4 g/dL (ref 6.5–8.1)

## 2023-07-19 NOTE — Telephone Encounter (Signed)
Will forward to provider  

## 2023-07-19 NOTE — Telephone Encounter (Signed)
Summary: Dizziness   Pt is calling in because she is experiencing dizziness when she sits up and when she stands. Pt says she doesn't have any other symptoms except pain in her waist. There is no fever. Pt says she does feel exhausted.        Interpreter: [id number]  Reason for Disposition . [1] Drinking very little AND [2] dehydration suspected (e.g., no urine > 12 hours, very dry mouth, very lightheaded)  Answer Assessment - Initial Assessment Questions 1. DESCRIPTION: "Describe your dizziness."     Patient feels like she is spinning, and her sight feels blurry 2. VERTIGO: "Do you feel like either you or the room is spinning or tilting?"      no 3. LIGHTHEADED: "Do you feel lightheaded?" (e.g., somewhat faint, woozy, weak upon standing)     Weak, pain in sides of body  Answer Assessment - Initial Assessment Questions 4. SEVERITY: "How bad is it?"  "Do you feel like you are going to faint?" "Can you stand and walk?"   - MILD: Feels slightly dizzy, but walking normally.   - MODERATE: Feels unsteady when walking, but not falling; interferes with normal activities (e.g., school, work).   - SEVERE: Unable to walk without falling, or requires assistance to walk without falling; feels like passing out now.      Walks normally- struggles- fatigued, going down stairs- makes her dizzy- fills like she is going to fall, mild/moderate 5. ONSET:  "When did the dizziness begin?"     1 month ago 6. AGGRAVATING FACTORS: "Does anything make it worse?" (e.g., standing, change in head position)     Sitting to standing, walking 7. HEART RATE: "Can you tell me your heart rate?" "How many beats in 15 seconds?"  (Note: not all patients can do this)       Patient does not check her BP- 8. CAUSE: "What do you think is causing the dizziness?"     unsure 9. RECURRENT SYMPTOM: "Have you had dizziness before?" If Yes, ask: "When was the last time?" "What happened that time?"     Yes- not sure 10. OTHER  SYMPTOMS: "Do you have any other symptoms?" (e.g., fever, chest pain, vomiting, diarrhea, bleeding)       Headache, side pain  Protocols used: Dizziness - Vertigo-A-AH, Dizziness - Lightheadedness-A-AH

## 2023-07-19 NOTE — ED Triage Notes (Signed)
Spanish interpreter used during triage. Pt in with L flank pain x 1 mo. Pt states since yesterday, she has also been very tired and dizzy. Denies any hematuria or fevers, but states she has had vaginal itching. Saw her PCP and she was negative for UTI 10 days ago

## 2023-07-19 NOTE — Telephone Encounter (Signed)
  Chief Complaint: dizziness Symptoms: dizziness, headache, side pain Frequency: 1 month Pertinent Negatives: Patient denies fever, chest pain, vomiting, diarrhea, bleeding  Disposition: [x] ED /[] Urgent Care (no appt availability in office) / [] Appointment(In office/virtual)/ []  [facility]/ [] Home Care/ [] Refused Recommended Disposition /[] [facility]/ []  Follow-up with PCP Additional Notes: Patient advised ED due to her high risk history and multiple complaints.

## 2023-07-19 NOTE — ED Provider Triage Note (Signed)
Emergency Medicine Provider Triage Evaluation Note  Rebecca [name] , a 57 y.o. female  was evaluated in triage.  Pt complains of bilateral flank pain.  Has been present for the past month.  It is worse with movement.  No nausea or vomiting.  Was seen recently at her primary care provider and had a negative urine test.  Review of Systems  Positive: As above Negative: As above  Physical Exam  BP 134/68 (BP Location: Right Arm)   Pulse (!) 107   Temp 98.8 F (37.1 C) (Oral)   Resp 18   Wt 81.6 kg   LMP  (LMP Unknown)   SpO2 100%   BMI 31.87 kg/m  Gen:   Awake, no distress   Resp:  Normal effort  MSK:   Moves extremities without difficulty  Other:  No abdominal TTP or CVA tenderness  Medical Decision Making  Medically screening exam initiated at 8:12 PM.  Appropriate orders placed.  Rebecca [name] was informed that the remainder of the evaluation will be completed by another provider, this initial triage assessment does not replace that evaluation, and the importance of remaining in the ED until their evaluation is complete.  Workup initiated   Mora Bellman 07/19/23 2013

## 2023-07-20 ENCOUNTER — Emergency Department (HOSPITAL_COMMUNITY): Payer: Self-pay

## 2023-07-20 ENCOUNTER — Other Ambulatory Visit: Payer: Self-pay

## 2023-07-20 LAB — WET PREP, GENITAL
Clue Cells Wet Prep HPF POC: NONE SEEN
Sperm: NONE SEEN
Trich, Wet Prep: NONE SEEN
WBC, Wet Prep HPF POC: >=10 — AB (ref <10)
Yeast Wet Prep HPF POC: NONE SEEN

## 2023-07-20 LAB — TROPONIN I (HIGH SENSITIVITY): Troponin I (High Sensitivity): 9 ng/L (ref <18)

## 2023-07-20 LAB — BRAIN NATRIURETIC PEPTIDE: B Natriuretic Peptide: 11.2 pg/mL (ref 0.0–100.0)

## 2023-07-20 MED ORDER — LIDOCAINE 5 % EX PTCH
1.0000 | MEDICATED_PATCH | CUTANEOUS | Status: DC
  Administered 2023-07-20: 1 via TRANSDERMAL
  Filled 2023-07-20: qty 1

## 2023-07-20 MED ORDER — ACETAMINOPHEN 325 MG PO TABS
650.0000 mg | ORAL_TABLET | Freq: Once | ORAL | Status: AC
  Administered 2023-07-20: 650 mg via ORAL
  Filled 2023-07-20: qty 2

## 2023-07-20 MED ORDER — SODIUM CHLORIDE 0.9 % IV BOLUS
500.0000 mL | Freq: Once | INTRAVENOUS | Status: AC
  Administered 2023-07-20: 500 mL via INTRAVENOUS

## 2023-07-20 MED ORDER — LIDOCAINE 5 % EX PTCH
1.0000 | MEDICATED_PATCH | CUTANEOUS | 0 refills | Status: DC
  Filled 2023-07-20: qty 30, 30d supply, fill #0

## 2023-07-20 NOTE — ED Notes (Signed)
Pt ambulated with pulse ox O2 remained at 100%. Pt denies dizziness or sob, daughter at bedside

## 2023-07-20 NOTE — Discharge Instructions (Signed)
Haga un seguimiento con su proveedor de atencin primaria con respecto a su visita reciente a la sala de emergencias.  Hoy sus anlisis e imgenes fueron todos negativos y lo ms probable es que haya una distensin muscular que cause sus sntomas.  Tome Tylenol cada 6 horas necesarias para el dolor junto con hielo y realice un seguimiento con su proveedor de Marine scientist.  Si los sntomas [place] o [place], regrese a Sports administrator.  La traduccin se Clinical cytogeneticist Translate y se hicieron todos los intentos para Licensed conveyancer.

## 2023-07-20 NOTE — ED Provider Notes (Signed)
Rebecca [name] Provider Note   CSN: [id number] Arrival date & time: 07/19/23  1858     History  Chief Complaint  Patient presents with   Flank Pain   Weakness    Rebecca [name] is a 57 y.o. female history of diabetes, HHS, hypertension, NSTEMI, CAD, AKI, hypertension, hyperlipidemia, hypothyroidism presented for left rib/flank pain for the past 2 months.  Patient is unsure what caused this but states that moving makes it worse.  Patient denies any dysuria or blood in her urine.  Patient has history of kidney stone.  Patient states that she has had some dyspnea on exertion over the past 2 months as well but denies feeling fluid overloaded having leg swelling.  Patient denies any recent travel/hospitalization/surgery, hemoptysis, personal history cancer, cough, fever, chest pain.  Patient denies abdominal pain, nausea/vomiting.  Patient has not taken any medications for her pain.  Patient denies previous blood clots in her lungs.  Patient states that she has not been drinking much water she has not felt the need to.  Spanish interpreter [phone]  Home Medications Prior to Admission medications   Medication Sig Start Date End Date Taking? Authorizing Provider  lidocaine (LIDODERM) 5 % Place 1 patch onto the skin daily. Remove & Discard patch within 12 hours or as directed by MD 07/20/23  Yes Deaisa Merida, Beverly Gust, PA-C  amLODipine (NORVASC) 10 MG tablet Take 1 tablet (10 mg total) by mouth daily. 01/18/23   Grayce Sessions, NP  aspirin 81 MG EC tablet Take 1 tablet (81 mg total) by mouth daily. 10/03/19   Grayce Sessions, NP  atorvastatin (LIPITOR) 40 MG tablet Take 1 tablet (40 mg total) by mouth daily. 01/18/23   Grayce Sessions, NP  Dulaglutide (TRULICITY) 3 MG/0.5ML SOPN INJECT 3 MG (0.5 ML) UNDER THE SKIN ONCE A WEEK 01/18/23   Grayce Sessions, NP  gabapentin (NEURONTIN) 400 MG capsule Take 1 capsule (400 mg total) by mouth 4  (four) times daily. 04/08/23   Grayce Sessions, NP  Insulin Glargine (BASAGLAR KWIKPEN) 100 UNIT/ML Inject 65 Units into the skin daily. 06/04/23   Grayce Sessions, NP  insulin lispro (HUMALOG KWIKPEN) 100 UNIT/ML KwikPen Inject 10 Units into the skin 2 (two) times daily before lunch and supper. 01/18/23   Grayce Sessions, NP  Insulin Pen Needle 31G X 8 MM MISC Inject 1 each into the skin at bedtime. 02/03/21   Grayce Sessions, NP  losartan (COZAAR) 100 MG tablet TAKE 1 TABLET (100 MG TOTAL) BY MOUTH DAILY. 01/18/23   Grayce Sessions, NP  metFORMIN (GLUCOPHAGE-XR) 500 MG 24 hr tablet TAKE 1 TABLET (500 MG TOTAL) BY MOUTH 2 (TWO) TIMES DAILY AFTER A MEAL. 01/18/23   Grayce Sessions, NP  methimazole (TAPAZOLE) 5 MG tablet TAKE 1 TABLET (5 MG TOTAL) BY MOUTH EVERY MONDAY, WEDNESDAY, AND FRIDAY. 01/18/23 01/18/24  Grayce Sessions, NP  traZODone (DESYREL) 50 MG tablet Take 0.5-1 tablets (25-50 mg total) by mouth at bedtime as needed for sleep. 04/29/23   Grayce Sessions, NP      Allergies    Patient has no known allergies.    Review of Systems   Review of Systems  Genitourinary:  Positive for flank pain.  Neurological:  Positive for weakness.    Physical Exam Updated Vital Signs BP (!) 149/65   Pulse 88   Temp 97.7 F (36.5 C) (Oral)   Resp 17  Ht 5\' 2"  (1.575 m)   Wt 80.7 kg   LMP  (LMP Unknown)   SpO2 100%   BMI 32.56 kg/m  Physical Exam Vitals reviewed.  Constitutional:      General: She is not in acute distress. HENT:     Head: Normocephalic and atraumatic.  Eyes:     Extraocular Movements: Extraocular movements intact.     Conjunctiva/sclera: Conjunctivae normal.     Pupils: Pupils are equal, round, and reactive to light.  Cardiovascular:     Rate and Rhythm: Normal rate and regular rhythm.     Pulses: Normal pulses.     Heart sounds: Normal heart sounds.     Comments: 2+ bilateral radial/dorsalis pedis pulses with regular rate Pulmonary:      Effort: Pulmonary effort is normal. No respiratory distress.     Breath sounds: Normal breath sounds.  Abdominal:     Palpations: Abdomen is soft.     Tenderness: There is no abdominal tenderness. There is no right CVA tenderness, left CVA tenderness, guarding or rebound.  Musculoskeletal:        General: Normal range of motion.     Cervical back: Normal range of motion and neck supple.     Comments: 5 out of 5 bilateral grip/leg extension strength Able to reproduce left rib pain with palpation of ribs without bony abnormalities  Skin:    General: Skin is warm and dry.     Capillary Refill: Capillary refill takes less than 2 seconds.  Neurological:     General: No focal deficit present.     Mental Status: She is alert and oriented to person, place, and time.     Comments: Sensation intact in all 4 limbs  Psychiatric:        Mood and Affect: Mood normal.     ED Results / Procedures / Treatments   Labs (all labs ordered are listed, but only abnormal results are displayed) Labs Reviewed  WET PREP, GENITAL - Abnormal; Notable for the following components:      Result Value   WBC, Wet Prep HPF POC >=10 (*)    All other components within normal limits  BASIC METABOLIC PANEL - Abnormal; Notable for the following components:   Glucose, Bld 158 (*)    BUN 28 (*)    Creatinine, Ser 1.85 (*)    GFR, Estimated 31 (*)    All other components within normal limits  CBC - Abnormal; Notable for the following components:   WBC 13.5 (*)    Hemoglobin 10.7 (*)    HCT 33.8 (*)    All other components within normal limits  URINALYSIS, ROUTINE W REFLEX MICROSCOPIC - Abnormal; Notable for the following components:   APPearance HAZY (*)    Protein, ur 30 (*)    Leukocytes,Ua MODERATE (*)    Bacteria, UA RARE (*)    All other components within normal limits  HEPATIC FUNCTION PANEL  LIPASE, BLOOD  BRAIN NATRIURETIC PEPTIDE  CBG MONITORING, ED  TROPONIN I (HIGH SENSITIVITY)    EKG EKG  Interpretation Date/Time:  Monday July 19 2023 19:51:09 EDT Ventricular Rate:  103 PR Interval:  144 QRS Duration:  88 QT Interval:  334 QTC Calculation: 437 R Axis:   77  Text Interpretation: Sinus tachycardia Otherwise normal ECG When compared with ECG of 05-Jul-2020 05:50, PREVIOUS ECG IS PRESENT Baseline wander Confirmed by Alvester Chou [phone]) on 07/20/2023 7:38:09 AM  Radiology CT Renal Stone Study  Result Date: 07/20/2023  CLINICAL DATA:  Abdominal pain, flank pain EXAM: CT ABDOMEN AND PELVIS WITHOUT CONTRAST TECHNIQUE: Multidetector CT imaging of the abdomen and pelvis was performed following the standard protocol without IV contrast. RADIATION DOSE REDUCTION: This exam was performed according to the departmental dose-optimization program which includes automated exposure control, adjustment of the mA and/or kV according to patient size and/or use of iterative reconstruction technique. COMPARISON:  06/20/2018 FINDINGS: Lower chest: Lung bases are clear. No effusions. Heart is normal size. Hepatobiliary: No focal hepatic abnormality. Gallbladder unremarkable. Pancreas: No focal abnormality or ductal dilatation. Spleen: No focal abnormality.  Normal size. Adrenals/Urinary Tract: No adrenal abnormality. No focal renal abnormality. No stones or hydronephrosis. Urinary bladder is unremarkable. Stomach/Bowel: Stomach, large and small bowel grossly unremarkable. Vascular/Lymphatic: Scattered aortic calcifications. No evidence of aneurysm or adenopathy. Reproductive: Uterus and adnexa unremarkable.  No mass. Other: No free fluid or free air. Musculoskeletal: No acute bony abnormality. IMPRESSION: No renal or ureteral stones.  No hydronephrosis. No acute findings. Scattered aortic atherosclerosis. Electronically Signed   By: Charlett Nose M.D.   On: 07/20/2023 12:37   DG Chest Port 1 View  Result Date: 07/20/2023 CLINICAL DATA:  Chest pain.  Shortness of breath. EXAM: PORTABLE CHEST 1 VIEW  COMPARISON:  09/05/2016. FINDINGS: Bilateral lung fields are clear. Bilateral costophrenic angles are clear. Normal cardio-mediastinal silhouette. No acute osseous abnormalities. The soft tissues are within normal limits. IMPRESSION: *No active disease. Electronically Signed   By: Jules Schick M.D.   On: 07/20/2023 09:21    Procedures Procedures    Medications Ordered in ED Medications  lidocaine (LIDODERM) 5 % 1 patch (has no administration in time range)  acetaminophen (TYLENOL) tablet 650 mg (650 mg Oral Given 07/20/23 0909)  sodium chloride 0.9 % bolus 500 mL (0 mLs Intravenous Stopped 07/20/23 1219)    ED Course/ Medical Decision Making/ A&P                                 Medical Decision Making Amount and/or Complexity of Data Reviewed Labs: ordered. Radiology: ordered.  Risk OTC drugs.   Gusta I Mele 57 y.o. presented today for flank pain. Working DDx that I considered at this time includes, but not limited to, MSK, nephrolithiasis, pyelonephritis, AAA, aortic dissection, mesenteric ischemia, RCC, obstructive uropathy, renal infarct/hemorrhage, tumor, biliary colic, pancreatitis, appendicitis, SBO, diverticulitis, shingles, lower lobe pneumonia, ectopic pregnancy, PID/TOA, ovarian torsion, endometriosis.  R/o DDx: nephrolithiasis, pyelonephritis, AAA, aortic dissection, mesenteric ischemia, RCC, obstructive uropathy, renal infarct/hemorrhage, tumor, biliary colic, pancreatitis, appendicitis, SBO, diverticulitis, shingles, lower lobe pneumonia, ectopic pregnancy, PID/TOA, ovarian torsion, endometriosis: These are considered less likely due to history of present illness, physical exam, lab/imaging findings  Review of prior external notes: 06/22/2023 outpatient visit  Unique Tests and My Interpretation:  CBC: Leukocytosis 13.5 BMP: Mild increase in creatinine 1.85, mild increase in BUN 28, GFR 31 Lipase: Unremarkable Hepatic function panel: Unremarkable UA:  Moderate leukocytes Troponin: 9 BNP: 11.2 Chest x-ray: Unremarkable Wet mount: Unremarkable CT Renal stone study: No acute findings EKG: Sinus 103 bpm, no ST elevations or depressions noted, no signs of right heart strain, no blocks noted  Social Determinants of Health: uninsured  Discussion with Independent Historian: None  Discussion of Management of Tests: None  Risk: Medium: prescription drug management  Risk Stratification Score: none  Staffed with Trifan, MD  Plan: On exam patient was in no acute distress stable vitals.  Patient's exam was  largely unremarkable.  Patient that she has been having the symptoms for the past 2 months that hurts to move which is suspicious for MSK.  Urine did not show any blood patient denied any dysuria or hematuria but does states she has vaginal itching which could be related to postmenopausal possible causes however patient does want to be tested again for BV and yeast infection and states that she can self swab.  Patient does have significant cardiac history and was endorsing dyspnea on exertion.  Patient did not appear fluid overloaded on exam and labs were unremarkable in terms of her heart.  Will obtain CT renal stone study to rule out stone for patient's left rib/flank pain however at this time a very low suspicion of PE causing patient's symptoms.  Patient's symptoms have been going on for 2 months and so with a negative initial troponin we do not need a second 1 as that should have been elevated at this time.  If CT negative will give lidocaine patches and encouraged her to follow-up with her primary care provider.  Patient is denying any kind of dysuria or urinary symptoms and although urine has some bacteria we will not treating for asymptomatic bacteriuria at this time as patient is asymptomatic.  Patient's labs and imaging are reassuring.  Patient was ambulated and did not become hypoxic in size have high suspicion patient's left flank pain is  MSK in nature and will provide lidocaine patch and encouraged Tylenol every 6 hours needed for pain.  Encouraged ice as well along the rest and to follow-up with primary care provider and to monitor symptoms.  Patient was given return precautions. Patient stable for discharge at this time.  Patient verbalized understanding of plan.  This chart was dictated using voice recognition software.  Despite best efforts to proofread,  errors can occur which can change the documentation meaning.         Final Clinical Impression(s) / ED Diagnoses Final diagnoses:  Left flank pain    Rx / DC Orders ED Discharge Orders          Ordered    lidocaine (LIDODERM) 5 %  Every 24 hours        07/20/23 1308              Netta Corrigan, PA-C 07/20/23 1325    Terald Sleeper, MD 07/20/23 1513

## 2023-07-21 ENCOUNTER — Encounter (INDEPENDENT_AMBULATORY_CARE_PROVIDER_SITE_OTHER): Payer: Self-pay | Admitting: Primary Care

## 2023-07-26 ENCOUNTER — Other Ambulatory Visit: Payer: Self-pay

## 2023-07-26 ENCOUNTER — Other Ambulatory Visit (HOSPITAL_COMMUNITY): Payer: Self-pay

## 2023-07-27 ENCOUNTER — Other Ambulatory Visit: Payer: Self-pay

## 2023-07-27 ENCOUNTER — Other Ambulatory Visit (INDEPENDENT_AMBULATORY_CARE_PROVIDER_SITE_OTHER): Payer: Self-pay | Admitting: Primary Care

## 2023-07-27 DIAGNOSIS — E1165 Type 2 diabetes mellitus with hyperglycemia: Secondary | ICD-10-CM

## 2023-07-27 NOTE — Telephone Encounter (Signed)
Will forward to provider  

## 2023-07-29 ENCOUNTER — Other Ambulatory Visit (HOSPITAL_BASED_OUTPATIENT_CLINIC_OR_DEPARTMENT_OTHER): Payer: Self-pay

## 2023-07-29 ENCOUNTER — Other Ambulatory Visit (HOSPITAL_COMMUNITY): Payer: Self-pay

## 2023-07-29 ENCOUNTER — Other Ambulatory Visit: Payer: Self-pay

## 2023-07-29 ENCOUNTER — Other Ambulatory Visit (INDEPENDENT_AMBULATORY_CARE_PROVIDER_SITE_OTHER): Payer: Self-pay | Admitting: Primary Care

## 2023-07-29 DIAGNOSIS — E1165 Type 2 diabetes mellitus with hyperglycemia: Secondary | ICD-10-CM

## 2023-07-29 MED ORDER — GABAPENTIN 400 MG PO CAPS
400.0000 mg | ORAL_CAPSULE | Freq: Four times a day (QID) | ORAL | 0 refills | Status: DC
  Filled 2023-07-29: qty 120, 30d supply, fill #0
  Filled 2023-09-20 (×2): qty 120, 30d supply, fill #1
  Filled 2023-10-27 (×2): qty 120, 30d supply, fill #2

## 2023-07-29 NOTE — Telephone Encounter (Signed)
Requested Prescriptions  Pending Prescriptions Disp Refills   gabapentin (NEURONTIN) 400 MG capsule 360 capsule 0    Sig: Take 1 capsule (400 mg total) by mouth 4 (four) times daily.     Neurology: Anticonvulsants - gabapentin Failed - 07/29/2023 11:38 AM      Failed - Cr in normal range and within 360 days    Creat  Date Value Ref Range Status  07/30/2015 1.13 (H) 0.50 - 1.10 mg/dL Final   Creatinine, Ser  Date Value Ref Range Status  07/19/2023 1.85 (H) 0.44 - 1.00 mg/dL Final         Passed - Completed PHQ-2 or PHQ-9 in the last 360 days      Passed - Valid encounter within last 12 months    Recent Outpatient Visits           3 months ago Essential hypertension   [place] [facility] Grayce Sessions, NP   5 months ago Cervical cancer screening   [place] [facility] Grayce Sessions, NP   6 months ago Type 2 diabetes mellitus without complication, with long-term current use of insulin (HCC)   [place] [facility] Grayce Sessions, NP   11 months ago Type 2 diabetes mellitus without complication, with long-term current use of insulin (HCC)   [place] [facility] Grayce Sessions, NP   1 year ago Type 2 diabetes mellitus without complication, with long-term current use of insulin [facility])   [place] [facility] Grayce Sessions, NP       Future Appointments             In 1 week Randa Evens, Kinnie Scales, NP [place] [facility]

## 2023-07-29 NOTE — Telephone Encounter (Signed)
Medication Refill -  Most Recent Primary Care Visit:  Provider: [facility]  Department[facility]  Visit Type: NURSE VISIT  Date: 06/22/2023  Medication:  gabapentin (NEURONTIN) 400 MG capsule  Has the patient contacted their pharmacy? Yes (Is this the correct pharmacy for this prescription? Yes If no, delete pharmacy and type the correct one.  This is the patient's preferred pharmacy:  [facility] [address], [address] [place] [address] Phone: [phone] Fax: [phone]  [facility] [address], [place] - [address] [place] [address] Phone: [phone] Fax: [phone]  [id number] [place], [place] - [address] AT [facility] [address] [place] [address] Phone: [phone] Fax: [phone]  [facility] - [place], [place] - [address] [place] [address] Phone: [phone] Fax: [phone]   Has the prescription been filled recently?no   Is the patient out of the medication? Yes  Has the patient been seen for an appointment in the last year OR does the patient have an upcoming appointment? Yes  Can we respond through MyChart? No  Agent: Please be advised that Rx refills may take up to 3 business days. We ask that you follow-up with your pharmacy.

## 2023-07-30 ENCOUNTER — Other Ambulatory Visit: Payer: Self-pay

## 2023-08-03 ENCOUNTER — Ambulatory Visit (INDEPENDENT_AMBULATORY_CARE_PROVIDER_SITE_OTHER): Payer: No Typology Code available for payment source | Admitting: Primary Care

## 2023-08-06 ENCOUNTER — Other Ambulatory Visit: Payer: Self-pay

## 2023-08-06 ENCOUNTER — Encounter (INDEPENDENT_AMBULATORY_CARE_PROVIDER_SITE_OTHER): Payer: Self-pay | Admitting: Primary Care

## 2023-08-06 ENCOUNTER — Ambulatory Visit (INDEPENDENT_AMBULATORY_CARE_PROVIDER_SITE_OTHER): Payer: Self-pay | Admitting: Primary Care

## 2023-08-06 VITALS — BP 128/82 | HR 96 | Resp 16 | Wt 172.4 lb

## 2023-08-06 DIAGNOSIS — E0590 Thyrotoxicosis, unspecified without thyrotoxic crisis or storm: Secondary | ICD-10-CM

## 2023-08-06 DIAGNOSIS — Z1211 Encounter for screening for malignant neoplasm of colon: Secondary | ICD-10-CM

## 2023-08-06 DIAGNOSIS — N1832 Chronic kidney disease, stage 3b: Secondary | ICD-10-CM

## 2023-08-06 DIAGNOSIS — Z1231 Encounter for screening mammogram for malignant neoplasm of breast: Secondary | ICD-10-CM

## 2023-08-06 DIAGNOSIS — Z794 Long term (current) use of insulin: Secondary | ICD-10-CM

## 2023-08-06 DIAGNOSIS — E1129 Type 2 diabetes mellitus with other diabetic kidney complication: Secondary | ICD-10-CM

## 2023-08-06 LAB — POCT GLYCOSYLATED HEMOGLOBIN (HGB A1C): HbA1c, POC (controlled diabetic range): 8.4 % — AB (ref 0.0–7.0)

## 2023-08-06 MED ORDER — INSULIN LISPRO (1 UNIT DIAL) 100 UNIT/ML (KWIKPEN)
10.0000 [IU] | PEN_INJECTOR | Freq: Two times a day (BID) | SUBCUTANEOUS | 3 refills | Status: DC
  Filled 2023-08-06: qty 3, 15d supply, fill #0
  Filled 2023-09-01: qty 6, 30d supply, fill #0
  Filled 2023-10-07: qty 6, 30d supply, fill #1
  Filled 2023-11-22 (×2): qty 6, 30d supply, fill #2
  Filled 2024-01-04: qty 6, 30d supply, fill #3

## 2023-08-06 NOTE — Progress Notes (Signed)
[facility] [place] [id number]  [facility]  Rebecca [name], is a 57 y.o. female  EAV:[id number]  NWG:[id number]  DOB - 01/19/1966  Chief Complaint  Patient presents with   Diabetes       Subjective:   Rebecca [name] is a 57 y.o. female here today for a follow up visit. Patient has No headache, No chest pain, No abdominal pain - No Nausea, No new weakness tingling or numbness, No Cough - shortness of breath  No problems updated.  No Known Allergies  Past Medical History:  Diagnosis Date   Anemia    Diabetes mellitus    Dyslipidemia    Hypertension    Non-STEMI (non-ST elevated myocardial infarction) [facility])    Medical therapy for distal LAD dissection   Thyroid disease     Current Outpatient Medications on File Prior to Visit  Medication Sig Dispense Refill   amLODipine (NORVASC) 10 MG tablet Take 1 tablet (10 mg total) by mouth daily. 90 tablet 1   aspirin 81 MG EC tablet Take 1 tablet (81 mg total) by mouth daily. 90 tablet 1   atorvastatin (LIPITOR) 40 MG tablet Take 1 tablet (40 mg total) by mouth daily. 90 tablet 1   Dulaglutide (TRULICITY) 3 MG/0.5ML SOPN INJECT 3 MG (0.5 ML) UNDER THE SKIN ONCE A WEEK 6 mL 3   gabapentin (NEURONTIN) 400 MG capsule Take 1 capsule (400 mg total) by mouth 4 (four) times daily. 360 capsule 0   Insulin Glargine (BASAGLAR KWIKPEN) 100 UNIT/ML Inject 65 Units into the skin daily. 18 mL 2   Insulin Pen Needle 31G X 8 MM MISC Inject 1 each into the skin at bedtime. 90 each 1   lidocaine (LIDODERM) 5 % Place 1 patch onto the skin daily. Remove & Discard patch within 12 hours or as directed by MD 30 patch 0   losartan (COZAAR) 100 MG tablet TAKE 1 TABLET (100 MG TOTAL) BY MOUTH DAILY. 90 tablet 1   methimazole (TAPAZOLE) 5 MG tablet TAKE 1 TABLET (5 MG TOTAL) BY MOUTH EVERY MONDAY, WEDNESDAY, AND FRIDAY. 90 tablet 1   traZODone (DESYREL) 50 MG tablet Take 0.5-1 tablets (25-50 mg total) by mouth at bedtime as needed for sleep. 30  tablet 3   No current facility-administered medications on file prior to visit.    Objective:   Vitals:   08/06/23 1038 08/06/23 1039  BP: 136/82 128/82  Pulse: 96   Resp: 16   SpO2: 98%   Weight: 172 lb 6.4 oz (78.2 kg)     Comprehensive ROS Pertinent positive and negative noted in HPI   Exam General appearance : Awake, alert, not in any distress. Speech Clear. Not toxic looking HEENT: Atraumatic and Normocephalic, pupils equally reactive to light and accomodation Neck: Supple, no JVD. No cervical lymphadenopathy.  Chest: Good air entry bilaterally, no added sounds  CVS: S1 S2 regular, no murmurs.  Abdomen: Bowel sounds present, Non tender and not distended with no gaurding, rigidity or rebound. Extremities: B/L Lower Ext shows no edema, both legs are warm to touch Neurology: Awake alert, and oriented X 3, CN II-XII intact, Non focal Skin: No Rash  Data Review Lab Results  Component Value Date   HGBA1C 8.4 (A) 08/06/2023   HGBA1C 8.2 (A) 04/29/2023   HGBA1C 8.0 (A) 01/13/2023    Assessment & Plan  Rebecca [name] was seen today for diabetes.  Diagnoses and all orders for this visit:  Encounter for screening mammogram for malignant neoplasm of breast -  MM DIGITAL SCREENING BILATERAL; Future  Type 2 diabetes mellitus with other diabetic kidney complication, with long-term current use of insulin (HCC) -     POCT glycosylated hemoglobin (Hb A1C) -     Microalbumin / creatinine urine ratio -     Ambulatory referral to Ophthalmology -     Lipid Panel  Screening for colon cancer -     Fecal occult blood, imunochemical  Stage 3b chronic kidney disease (HCC) -     Ambulatory referral to Nephrology  Hyperthyroidism -     TSH + free T4  Other orders -     insulin lispro (HUMALOG KWIKPEN) 100 UNIT/ML KwikPen; Inject 10 Units into the skin 2 (two) times daily before lunch and supper.  Rebecca [name] was seen today for diabetes.  Diagnoses and all orders for this  visit:  Encounter for screening mammogram for malignant neoplasm of breast -     MM DIGITAL SCREENING BILATERAL; Future  Type 2 diabetes mellitus with other diabetic kidney complication, with long-term current use of insulin (HCC) -     POCT glycosylated hemoglobin (Hb A1C) -     Microalbumin / creatinine urine ratio -     Ambulatory referral to Ophthalmology -     Lipid Panel  Screening for colon cancer -     Fecal occult blood, imunochemical  Stage 3b chronic kidney disease (HCC) -     Ambulatory referral to Nephrology  Hyperthyroidism -     TSH + free T4  Other orders -     insulin lispro (HUMALOG KWIKPEN) 100 UNIT/ML KwikPen; Inject 10 Units into the skin 2 (two) times daily before lunch and supper.      Patient have been counseled extensively about nutrition and exercise. Other issues discussed during this visit include: low cholesterol diet, weight control and daily exercise, foot care, annual eye examinations at Ophthalmology, importance of adherence with medications and regular follow-up. We also discussed long term complications of uncontrolled diabetes and hypertension.   Return in about 3 months (around 11/06/2023) for medical conditions.  The patient was given clear instructions to go to ER or return to medical center if symptoms don't improve, worsen or new problems develop. The patient verbalized understanding. The patient was told to call to get lab results if they haven't heard anything in the next week.   This note has been created with Education officer, environmental. Any transcriptional errors are unintentional.   Grayce Sessions, NP 08/06/2023, 1:10 PM

## 2023-08-06 NOTE — Patient Instructions (Signed)

## 2023-08-07 LAB — MICROALBUMIN / CREATININE URINE RATIO
Creatinine, Urine: 127.2 mg/dL
Microalb/Creat Ratio: 129 mg/g{creat} — ABNORMAL HIGH (ref 0–29)
Microalbumin, Urine: 164.5 ug/mL

## 2023-08-07 LAB — LIPID PANEL
Chol/HDL Ratio: 3.4 ratio (ref 0.0–4.4)
Cholesterol, Total: 128 mg/dL (ref 100–199)
HDL: 38 mg/dL — ABNORMAL LOW (ref >39)
LDL Chol Calc (NIH): 68 mg/dL (ref 0–99)
Triglycerides: 124 mg/dL (ref 0–149)
VLDL Cholesterol Cal: 22 mg/dL (ref 5–40)

## 2023-08-07 LAB — TSH+FREE T4
Free T4: 1.34 ng/dL (ref 0.82–1.77)
TSH: 1.69 u[IU]/mL (ref 0.450–4.500)

## 2023-08-08 ENCOUNTER — Other Ambulatory Visit (INDEPENDENT_AMBULATORY_CARE_PROVIDER_SITE_OTHER): Payer: Self-pay | Admitting: Primary Care

## 2023-08-08 DIAGNOSIS — E0590 Thyrotoxicosis, unspecified without thyrotoxic crisis or storm: Secondary | ICD-10-CM

## 2023-08-08 MED ORDER — METHIMAZOLE 5 MG PO TABS
5.0000 mg | ORAL_TABLET | ORAL | 1 refills | Status: DC
  Filled 2023-08-08: qty 36, 84d supply, fill #0
  Filled 2023-09-20: qty 48, 84d supply, fill #0
  Filled 2023-12-27: qty 48, 84d supply, fill #1

## 2023-08-09 ENCOUNTER — Other Ambulatory Visit: Payer: Self-pay

## 2023-08-17 ENCOUNTER — Other Ambulatory Visit: Payer: Self-pay

## 2023-09-01 ENCOUNTER — Other Ambulatory Visit: Payer: Self-pay

## 2023-09-20 ENCOUNTER — Other Ambulatory Visit (INDEPENDENT_AMBULATORY_CARE_PROVIDER_SITE_OTHER): Payer: Self-pay | Admitting: Primary Care

## 2023-09-20 ENCOUNTER — Other Ambulatory Visit: Payer: Self-pay

## 2023-09-20 DIAGNOSIS — E1165 Type 2 diabetes mellitus with hyperglycemia: Secondary | ICD-10-CM

## 2023-10-07 ENCOUNTER — Other Ambulatory Visit: Payer: Self-pay

## 2023-10-07 ENCOUNTER — Other Ambulatory Visit (INDEPENDENT_AMBULATORY_CARE_PROVIDER_SITE_OTHER): Payer: Self-pay | Admitting: Primary Care

## 2023-10-07 DIAGNOSIS — E1165 Type 2 diabetes mellitus with hyperglycemia: Secondary | ICD-10-CM

## 2023-10-07 MED ORDER — BASAGLAR KWIKPEN 100 UNIT/ML ~~LOC~~ SOPN
65.0000 [IU] | PEN_INJECTOR | Freq: Every day | SUBCUTANEOUS | 2 refills | Status: DC
  Filled 2023-10-07 – 2023-10-27 (×2): qty 18, 27d supply, fill #0
  Filled 2023-12-14 (×2): qty 18, 27d supply, fill #1
  Filled 2024-01-19: qty 18, 27d supply, fill #2

## 2023-10-07 NOTE — Telephone Encounter (Signed)
Requested Prescriptions  Pending Prescriptions Disp Refills   Insulin Glargine (BASAGLAR KWIKPEN) 100 UNIT/ML 18 mL 2    Sig: Inject 65 Units into the skin daily.     Endocrinology:  Diabetes - Insulins Failed - 10/07/2023  2:02 PM      Failed - HBA1C is between 0 and 7.9 and within 180 days    HbA1c, POC (controlled diabetic range)  Date Value Ref Range Status  08/06/2023 8.4 (A) 0.0 - 7.0 % Final         Passed - Valid encounter within last 6 months    Recent Outpatient Visits           2 months ago Encounter for screening mammogram for malignant neoplasm of breast   [place] [facility] Grayce Sessions, NP   5 months ago Essential hypertension   [place] [facility] Grayce Sessions, NP   7 months ago Cervical cancer screening   [place] [facility] Grayce Sessions, NP   8 months ago Type 2 diabetes mellitus without complication, with long-term current use of insulin (HCC)   [place] [facility] Grayce Sessions, NP   1 year ago Type 2 diabetes mellitus without complication, with long-term current use of insulin [facility])   [place] [facility] Grayce Sessions, NP       Future Appointments             In 1 month Randa Evens, Kinnie Scales, NP [place] [facility]

## 2023-10-18 ENCOUNTER — Other Ambulatory Visit: Payer: Self-pay

## 2023-10-27 ENCOUNTER — Other Ambulatory Visit (INDEPENDENT_AMBULATORY_CARE_PROVIDER_SITE_OTHER): Payer: Self-pay | Admitting: Primary Care

## 2023-10-27 ENCOUNTER — Other Ambulatory Visit: Payer: Self-pay

## 2023-10-27 MED ORDER — ATORVASTATIN CALCIUM 40 MG PO TABS
40.0000 mg | ORAL_TABLET | Freq: Every day | ORAL | 1 refills | Status: DC
  Filled 2023-10-27: qty 90, 90d supply, fill #0
  Filled 2024-02-01: qty 90, 90d supply, fill #1

## 2023-10-28 ENCOUNTER — Other Ambulatory Visit: Payer: Self-pay

## 2023-11-08 ENCOUNTER — Ambulatory Visit (INDEPENDENT_AMBULATORY_CARE_PROVIDER_SITE_OTHER): Payer: Self-pay | Admitting: Primary Care

## 2023-11-22 ENCOUNTER — Other Ambulatory Visit: Payer: Self-pay

## 2023-11-23 ENCOUNTER — Other Ambulatory Visit: Payer: Self-pay

## 2023-12-14 ENCOUNTER — Other Ambulatory Visit: Payer: Self-pay

## 2023-12-27 ENCOUNTER — Other Ambulatory Visit (INDEPENDENT_AMBULATORY_CARE_PROVIDER_SITE_OTHER): Payer: Self-pay | Admitting: Primary Care

## 2023-12-27 ENCOUNTER — Other Ambulatory Visit: Payer: Self-pay

## 2023-12-27 DIAGNOSIS — E1165 Type 2 diabetes mellitus with hyperglycemia: Secondary | ICD-10-CM

## 2023-12-27 DIAGNOSIS — I10 Essential (primary) hypertension: Secondary | ICD-10-CM

## 2023-12-28 ENCOUNTER — Other Ambulatory Visit: Payer: Self-pay

## 2023-12-28 MED ORDER — LOSARTAN POTASSIUM 100 MG PO TABS
100.0000 mg | ORAL_TABLET | Freq: Every day | ORAL | 0 refills | Status: DC
  Filled 2023-12-28: qty 90, 90d supply, fill #0

## 2023-12-28 MED ORDER — GABAPENTIN 400 MG PO CAPS
400.0000 mg | ORAL_CAPSULE | Freq: Four times a day (QID) | ORAL | 0 refills | Status: DC
  Filled 2023-12-28: qty 120, 30d supply, fill #0

## 2023-12-28 NOTE — Telephone Encounter (Signed)
 Requested Prescriptions  Pending Prescriptions Disp Refills   losartan (COZAAR) 100 MG tablet 90 tablet 0    Sig: TAKE 1 TABLET (100 MG TOTAL) BY MOUTH DAILY.     Cardiovascular:  Angiotensin Receptor Blockers Failed - 12/28/2023  2:32 PM      Failed - Cr in normal range and within 180 days    Creat  Date Value Ref Range Status  07/30/2015 1.13 (H) 0.50 - 1.10 mg/dL Final   Creatinine, Ser  Date Value Ref Range Status  07/19/2023 1.85 (H) 0.44 - 1.00 mg/dL Final         Passed - K in normal range and within 180 days    Potassium  Date Value Ref Range Status  07/19/2023 4.5 3.5 - 5.1 mmol/L Final         Passed - Patient is not pregnant      Passed - Last BP in normal range    BP Readings from Last 1 Encounters:  08/06/23 128/82         Passed - Valid encounter within last 6 months    Recent Outpatient Visits           4 months ago Encounter for screening mammogram for malignant neoplasm of breast   [place] [facility] Grayce Sessions, NP   8 months ago Essential hypertension   [place] [facility] Grayce Sessions, NP   10 months ago Cervical cancer screening   [place] [facility] Grayce Sessions, NP   11 months ago Type 2 diabetes mellitus without complication, with long-term current use of insulin (HCC)   [place] [facility] Grayce Sessions, NP   1 year ago Type 2 diabetes mellitus without complication, with long-term current use of insulin (HCC)   [place] [facility] Grayce Sessions, NP               gabapentin (NEURONTIN) 400 MG capsule 360 capsule 0    Sig: Take 1 capsule (400 mg total) by mouth 4 (four) times daily.     Neurology: Anticonvulsants - gabapentin Failed - 12/28/2023  2:32 PM      Failed - Cr in normal range and within 360 days    Creat  Date Value Ref Range Status  07/30/2015 1.13 (H) 0.50 - 1.10 mg/dL Final   Creatinine, Ser   Date Value Ref Range Status  07/19/2023 1.85 (H) 0.44 - 1.00 mg/dL Final         Passed - Completed PHQ-2 or PHQ-9 in the last 360 days      Passed - Valid encounter within last 12 months    Recent Outpatient Visits           4 months ago Encounter for screening mammogram for malignant neoplasm of breast   [place] [facility] Grayce Sessions, NP   8 months ago Essential hypertension   [place] [facility] Grayce Sessions, NP   10 months ago Cervical cancer screening   [place] [facility] Grayce Sessions, NP   11 months ago Type 2 diabetes mellitus without complication, with long-term current use of insulin (HCC)   [place] [facility] Grayce Sessions, NP   1 year ago Type 2 diabetes mellitus without complication, with long-term current use of insulin [facility])    [facility] Grayce Sessions, NP

## 2024-01-04 ENCOUNTER — Other Ambulatory Visit: Payer: Self-pay

## 2024-01-05 ENCOUNTER — Other Ambulatory Visit: Payer: Self-pay

## 2024-01-14 ENCOUNTER — Other Ambulatory Visit: Payer: Self-pay

## 2024-01-19 ENCOUNTER — Other Ambulatory Visit: Payer: Self-pay

## 2024-01-19 ENCOUNTER — Other Ambulatory Visit (INDEPENDENT_AMBULATORY_CARE_PROVIDER_SITE_OTHER): Payer: Self-pay | Admitting: Primary Care

## 2024-01-19 MED ORDER — TRULICITY 3 MG/0.5ML ~~LOC~~ SOAJ
3.0000 mg | SUBCUTANEOUS | 3 refills | Status: DC
  Filled 2024-01-19 – 2024-02-01 (×2): qty 2, 28d supply, fill #0
  Filled 2024-03-28: qty 2, 28d supply, fill #1
  Filled 2024-06-13 (×3): qty 2, 28d supply, fill #2
  Filled 2024-07-19 (×2): qty 2, 28d supply, fill #3
  Filled 2024-08-23 (×2): qty 2, 28d supply, fill #4

## 2024-01-19 NOTE — Telephone Encounter (Signed)
 Will forward to provider

## 2024-01-20 ENCOUNTER — Other Ambulatory Visit: Payer: Self-pay

## 2024-01-31 ENCOUNTER — Other Ambulatory Visit: Payer: Self-pay

## 2024-02-01 ENCOUNTER — Other Ambulatory Visit: Payer: Self-pay

## 2024-02-02 ENCOUNTER — Other Ambulatory Visit: Payer: Self-pay

## 2024-02-03 ENCOUNTER — Ambulatory Visit (INDEPENDENT_AMBULATORY_CARE_PROVIDER_SITE_OTHER): Payer: Self-pay | Admitting: Primary Care

## 2024-02-03 ENCOUNTER — Encounter (INDEPENDENT_AMBULATORY_CARE_PROVIDER_SITE_OTHER): Payer: Self-pay | Admitting: Primary Care

## 2024-02-03 ENCOUNTER — Other Ambulatory Visit: Payer: Self-pay

## 2024-02-03 VITALS — BP 138/85 | HR 98 | Resp 15 | Ht 64.0 in | Wt 176.4 lb

## 2024-02-03 DIAGNOSIS — E0590 Thyrotoxicosis, unspecified without thyrotoxic crisis or storm: Secondary | ICD-10-CM

## 2024-02-03 DIAGNOSIS — I10 Essential (primary) hypertension: Secondary | ICD-10-CM

## 2024-02-03 DIAGNOSIS — Z794 Long term (current) use of insulin: Secondary | ICD-10-CM

## 2024-02-03 DIAGNOSIS — E1165 Type 2 diabetes mellitus with hyperglycemia: Secondary | ICD-10-CM

## 2024-02-03 DIAGNOSIS — E782 Mixed hyperlipidemia: Secondary | ICD-10-CM

## 2024-02-03 DIAGNOSIS — E1129 Type 2 diabetes mellitus with other diabetic kidney complication: Secondary | ICD-10-CM

## 2024-02-03 LAB — POCT GLYCOSYLATED HEMOGLOBIN (HGB A1C): HbA1c, POC (controlled diabetic range): 8.8 % — AB (ref 0.0–7.0)

## 2024-02-03 MED ORDER — GABAPENTIN 400 MG PO CAPS
400.0000 mg | ORAL_CAPSULE | Freq: Four times a day (QID) | ORAL | 1 refills | Status: DC
  Filled 2024-02-03: qty 120, 30d supply, fill #0
  Filled 2024-04-25: qty 120, 30d supply, fill #1
  Filled 2024-06-13 (×2): qty 120, 30d supply, fill #2
  Filled 2024-07-19 (×2): qty 120, 30d supply, fill #3
  Filled 2024-08-23 (×3): qty 120, 30d supply, fill #4

## 2024-02-03 MED ORDER — LOSARTAN POTASSIUM 100 MG PO TABS
100.0000 mg | ORAL_TABLET | Freq: Every day | ORAL | 1 refills | Status: DC
  Filled 2024-02-03 – 2024-06-13 (×4): qty 90, 90d supply, fill #0

## 2024-02-03 MED ORDER — ATORVASTATIN CALCIUM 40 MG PO TABS
40.0000 mg | ORAL_TABLET | Freq: Every day | ORAL | 1 refills | Status: DC
  Filled 2024-02-03 – 2024-04-27 (×2): qty 90, 90d supply, fill #0
  Filled 2024-06-13 – 2024-07-19 (×3): qty 90, 90d supply, fill #1

## 2024-02-03 MED ORDER — INSULIN LISPRO (1 UNIT DIAL) 100 UNIT/ML (KWIKPEN)
10.0000 [IU] | PEN_INJECTOR | Freq: Two times a day (BID) | SUBCUTANEOUS | 3 refills | Status: DC
  Filled 2024-02-03: qty 6, 30d supply, fill #0
  Filled 2024-03-28: qty 6, 30d supply, fill #1
  Filled 2024-04-27: qty 6, 30d supply, fill #2
  Filled 2024-06-13 (×2): qty 6, 30d supply, fill #3
  Filled 2024-07-26: qty 6, 30d supply, fill #4
  Filled 2024-09-06 (×2): qty 6, 30d supply, fill #5

## 2024-02-03 MED ORDER — BASAGLAR KWIKPEN 100 UNIT/ML ~~LOC~~ SOPN
65.0000 [IU] | PEN_INJECTOR | Freq: Every day | SUBCUTANEOUS | 2 refills | Status: DC
  Filled 2024-02-03 – 2024-02-18 (×2): qty 18, 27d supply, fill #0
  Filled 2024-03-28: qty 18, 27d supply, fill #1
  Filled 2024-05-05: qty 18, 27d supply, fill #2

## 2024-02-03 MED ORDER — AMLODIPINE BESYLATE 10 MG PO TABS
10.0000 mg | ORAL_TABLET | Freq: Every day | ORAL | 1 refills | Status: DC
  Filled 2024-02-03: qty 90, 90d supply, fill #0
  Filled 2024-06-13 (×2): qty 90, 90d supply, fill #1

## 2024-02-03 NOTE — Progress Notes (Signed)
 BP 138/85   Pulse 98   Resp 15   Ht 5\' 4"  (1.626 m)   Wt 176 lb 6.4 oz (80 kg)   LMP  (LMP Unknown)   SpO2 99%   BMI 30.28 kg/m    Subjective:    Patient ID: Rebecca [name], female    DOB: June 29, 1966, 58 y.o.   MRN: [id number] [place]  HPI: [name] is a 58 y.o. female presenting on 02/03/2024 for comprehensive medical examination.  Current medical complaints include:none  She currently lives with: Menopausal Symptoms: no  Depression Screen done today and results listed below:     02/03/2024   10:15 AM 01/13/2023    8:59 AM 08/17/2022    9:53 AM 05/13/2022    8:34 AM 02/09/2022   11:22 AM  Depression screen PHQ 2/9  Decreased Interest 0 0 1 0 0  Down, Depressed, Hopeless 0 0 1 0 0  PHQ - 2 Score 0 0 2 0 0  Altered sleeping   0    Tired, decreased energy   1    Feeling bad or failure about yourself    1    Trouble concentrating   0    Moving slowly or fidgety/restless   0    Suicidal thoughts   0    PHQ-9 Score   4      The patient does not have a history of falls. I did complete a risk assessment for falls. A plan of care for falls was not documented.   Past Medical History:  Past Medical History:  Diagnosis Date   Anemia    Diabetes mellitus    Dyslipidemia    Hypertension    Non-STEMI (non-ST elevated myocardial infarction) [facility])    Medical therapy for distal LAD dissection   Thyroid  disease     Surgical History:  Past Surgical History:  Procedure Laterality Date   AMPUTATION Right 07/21/2017   Procedure: RIGHT GREAT TOE AMPUTATION;  Surgeon: Timothy Ford, MD;  Location: [facility] OR;  Service: Orthopedics;  Laterality: Right;   AMPUTATION Right 07/05/2020   Procedure: RIGHT FOOT 2ND RAY AMPUTATION;  Surgeon: Timothy Ford, MD;  Location: [facility] OR;  Service: Orthopedics;  Laterality: Right;   CARDIAC CATHETERIZATION N/A 05/14/2015   Procedure: Left Heart Cath and Coronary Angiography;  Surgeon: Arleen Lacer, MD; dLAD 60%>>95% (small  vessel, ?dissection), CFX & RCA systems no sig dz, EF & LVEDP nl       Medications:  Current Outpatient Medications on File Prior to Visit  Medication Sig   amLODipine  (NORVASC ) 10 MG tablet Take 1 tablet (10 mg total) by mouth daily.   aspirin  81 MG EC tablet Take 1 tablet (81 mg total) by mouth daily.   atorvastatin  (LIPITOR ) 40 MG tablet Take 1 tablet (40 mg total) by mouth daily.   Dulaglutide  (TRULICITY ) 3 MG/0.5ML SOAJ Inject 3 mg into the skin once a week.   gabapentin  (NEURONTIN ) 400 MG capsule Take 1 capsule (400 mg total) by mouth 4 (four) times daily.   Insulin  Glargine (BASAGLAR  KWIKPEN) 100 UNIT/ML Inject 65 Units into the skin daily.   insulin  lispro (HUMALOG  KWIKPEN) 100 UNIT/ML KwikPen Inject 10 Units into the skin 2 (two) times daily before lunch and supper.   Insulin  Pen Needle 31G X 8 MM MISC Inject 1 each into the skin at bedtime.   methimazole  (TAPAZOLE ) 5 MG tablet Take 1 tablet (5 mg total) by mouth every Monday, Wednesday, and Friday.  lidocaine  (LIDODERM ) 5 % Place 1 patch onto the skin daily. Remove & Discard patch within 12 hours or as directed by MD (Patient not taking: Reported on 02/03/2024)   losartan  (COZAAR ) 100 MG tablet Take 1 tablet (100 mg total) by mouth daily. (Patient not taking: Reported on 02/03/2024)   traZODone  (DESYREL ) 50 MG tablet Take 0.5-1 tablets (25-50 mg total) by mouth at bedtime as needed for sleep. (Patient not taking: Reported on 02/03/2024)   No current facility-administered medications on file prior to visit.    Allergies:  No Known Allergies  Social History:  Social History   Socioeconomic History   Marital status: Married    Spouse name: Not on file   Number of children: Not on file   Years of education: Not on file   Highest education level: Not on file  Occupational History   Occupation: unemployed  Tobacco Use   Smoking status: Never   Smokeless tobacco: Never  Vaping Use   Vaping status: Never Used  Substance and  Sexual Activity   Alcohol use: No   Drug use: No   Sexual activity: Yes  Other Topics Concern   Not on file  Social History Narrative   Not on file   Social Drivers of Health   Financial Resource Strain: Not on file  Food Insecurity: No Food Insecurity (02/03/2024)   Hunger Vital Sign    Worried About Running Out of Food in the Last Year: Never true    Ran Out of Food in the Last Year: Never true  Transportation Needs: No Transportation Needs (02/03/2024)   PRAPARE - Administrator, Civil Service (Medical): No    Lack of Transportation (Non-Medical): No  Physical Activity: Not on file  Stress: Not on file  Social Connections: Not on file  Intimate Partner Violence: Not At Risk (02/03/2024)   Humiliation, Afraid, Rape, and Kick questionnaire    Fear of Current or Ex-Partner: No    Emotionally Abused: No    Physically Abused: No    Sexually Abused: No   Social History   Tobacco Use  Smoking Status Never  Smokeless Tobacco Never   Social History   Substance and Sexual Activity  Alcohol Use No    Family History:  Family History  Problem Relation Age of Onset   Cancer Mother    Hypertension Father    Hyperlipidemia Father    Diabetes Sister    Diabetes Sister    Diabetes Sister    Diabetes Sister    Diabetes Brother    Diabetes Brother    Diabetes Brother    Diabetes Brother    Diabetes Brother    Breast cancer Neg Hx   Past medical history, surgical history, medications, allergies, family history and social history reviewed with patient today and changes made to appropriate areas of the chart.   Review of Systems - General ROS: negative All other ROS negative except what is listed above and in the HPI.      Objective:     BP 138/85   Pulse 98   Resp 15   Ht 5\' 4"  (1.626 m)   Wt 176 lb 6.4 oz (80 kg)   LMP  (LMP Unknown)   SpO2 99%   BMI 30.28 kg/m   Wt Readings from Last 3 Encounters:  02/03/24 176 lb 6.4 oz (80 kg)  08/06/23 172 lb  6.4 oz (78.2 kg)  07/20/23 178 lb (80.7 kg)    Physical Exam  General: No apparent distress. Eyes: Extraocular eye movements intact, pupils equal and round. Neck: Supple, trachea midline. Thyroid : No enlargement, mobile without fixation, no tenderness. Cardiovascular: Regular rhythm and rate, no murmur, normal radial pulses. Respiratory: Normal respiratory effort, clear to auscultation. Gastrointestinal: Normal pitch active bowel sounds, nontender abdomen without distention or appreciable hepatomegaly. Musculoskeletal: Normal muscle tone, no tenderness on palpation of tibia, no excessive thoracic kyphosis. Skin: Appropriate warmth, no visible rash. Mental status: Alert, conversant, speech clear, thought logical, appropriate mood and affect, no hallucinations or delusions evident. Hematologic/lymphatic: No cervical adenopathy, no visible ecchymoses.   Results for orders placed or performed in visit on 08/06/23  POCT glycosylated hemoglobin (Hb A1C)   Collection Time: 08/06/23 10:41 AM  Result Value Ref Range   Hemoglobin A1C     HbA1c POC (<> result, manual entry)     HbA1c, POC (prediabetic range)     HbA1c, POC (controlled diabetic range) 8.4 (A) 0.0 - 7.0 %  Microalbumin / creatinine urine ratio   Collection Time: 08/06/23 11:43 AM  Result Value Ref Range   Creatinine, Urine 127.2 Not Estab. mg/dL   Microalbumin, Urine 161.0 Not Estab. ug/mL   Microalb/Creat Ratio 129 (H) 0 - 29 mg/g creat  TSH + free T4   Collection Time: 08/06/23 11:43 AM  Result Value Ref Range   TSH 1.690 0.450 - 4.500 uIU/mL   Free T4 1.34 0.82 - 1.77 ng/dL  Lipid Panel   Collection Time: 08/06/23 11:43 AM  Result Value Ref Range   Cholesterol, Total 128 100 - 199 mg/dL   Triglycerides 960 0 - 149 mg/dL   HDL 38 (L) >45 mg/dL   VLDL Cholesterol Cal 22 5 - 40 mg/dL   LDL Chol Calc (NIH) 68 0 - 99 mg/dL   Chol/HDL Ratio 3.4 0.0 - 4.4 ratio      Assessment & Plan:  Haja was seen today for annual  exam and diabetes.  Diagnoses and all orders for this visit:  Essential hypertension -     CBC with Differential/Platelet -     CMP14+EGFR -     losartan  (COZAAR ) 100 MG tablet; Take 1 tablet (100 mg total) by mouth daily.  Hyperthyroidism -     TSH+T4F+T3Free  Type 2 diabetes mellitus with other diabetic kidney complication, with long-term current use of insulin  (HCC) -     POCT glycosylated hemoglobin (Hb A1C) -     CBC with Differential/Platelet  Mixed hyperlipidemia -     Lipid panel  Uncontrolled type 2 diabetes mellitus with hyperglycemia (HCC) -     Insulin  Glargine (BASAGLAR  KWIKPEN) 100 UNIT/ML; Inject 65 Units into the skin daily. -     gabapentin  (NEURONTIN ) 400 MG capsule; Take 1 capsule (400 mg total) by mouth 4 (four) times daily.  Other orders -     insulin  lispro (HUMALOG  KWIKPEN) 100 UNIT/ML KwikPen; Inject 10 Units into the skin 2 (two) times daily before lunch and supper. -     atorvastatin  (LIPITOR ) 40 MG tablet; Take 1 tablet (40 mg total) by mouth daily. -     amLODipine  (NORVASC ) 10 MG tablet; Take 1 tablet (10 mg total) by mouth daily.      Follow up plan:   NEXT PREVENTATIVE PHYSICAL DUE IN 1 YEAR.

## 2024-02-04 LAB — CMP14+EGFR
ALT: 18 IU/L (ref 0–32)
AST: 23 IU/L (ref 0–40)
Albumin: 4.6 g/dL (ref 3.8–4.9)
Alkaline Phosphatase: 136 IU/L — ABNORMAL HIGH (ref 44–121)
BUN/Creatinine Ratio: 21 (ref 9–23)
BUN: 23 mg/dL (ref 6–24)
Bilirubin Total: 0.5 mg/dL (ref 0.0–1.2)
CO2: 23 mmol/L (ref 20–29)
Calcium: 9.6 mg/dL (ref 8.7–10.2)
Chloride: 106 mmol/L (ref 96–106)
Creatinine, Ser: 1.12 mg/dL — ABNORMAL HIGH (ref 0.57–1.00)
Globulin, Total: 2.7 g/dL (ref 1.5–4.5)
Glucose: 108 mg/dL — ABNORMAL HIGH (ref 70–99)
Potassium: 4.8 mmol/L (ref 3.5–5.2)
Sodium: 142 mmol/L (ref 134–144)
Total Protein: 7.3 g/dL (ref 6.0–8.5)
eGFR: 57 mL/min/{1.73_m2} — ABNORMAL LOW (ref >59)

## 2024-02-04 LAB — LIPID PANEL
Chol/HDL Ratio: 2.8 ratio (ref 0.0–4.4)
Cholesterol, Total: 130 mg/dL (ref 100–199)
HDL: 47 mg/dL (ref >39)
LDL Chol Calc (NIH): 62 mg/dL (ref 0–99)
Triglycerides: 116 mg/dL (ref 0–149)
VLDL Cholesterol Cal: 21 mg/dL (ref 5–40)

## 2024-02-04 LAB — CBC WITH DIFFERENTIAL/PLATELET
Basophils Absolute: 0.1 10*3/uL (ref 0.0–0.2)
Basos: 1 %
EOS (ABSOLUTE): 0.3 10*3/uL (ref 0.0–0.4)
Eos: 4 %
Hematocrit: 35.6 % (ref 34.0–46.6)
Hemoglobin: 11.4 g/dL (ref 11.1–15.9)
Immature Grans (Abs): 0 10*3/uL (ref 0.0–0.1)
Immature Granulocytes: 0 %
Lymphocytes Absolute: 2.4 10*3/uL (ref 0.7–3.1)
Lymphs: 29 %
MCH: 28.1 pg (ref 26.6–33.0)
MCHC: 32 g/dL (ref 31.5–35.7)
MCV: 88 fL (ref 79–97)
Monocytes Absolute: 0.5 10*3/uL (ref 0.1–0.9)
Monocytes: 6 %
Neutrophils Absolute: 4.9 10*3/uL (ref 1.4–7.0)
Neutrophils: 60 %
Platelets: 335 10*3/uL (ref 150–450)
RBC: 4.06 x10E6/uL (ref 3.77–5.28)
RDW: 13.4 % (ref 11.7–15.4)
WBC: 8.1 10*3/uL (ref 3.4–10.8)

## 2024-02-04 LAB — TSH+T4F+T3FREE
Free T4: 1.16 ng/dL (ref 0.82–1.77)
T3, Free: 2.8 pg/mL (ref 2.0–4.4)
TSH: 1.8 u[IU]/mL (ref 0.450–4.500)

## 2024-02-07 ENCOUNTER — Other Ambulatory Visit (INDEPENDENT_AMBULATORY_CARE_PROVIDER_SITE_OTHER): Payer: Self-pay | Admitting: Primary Care

## 2024-02-07 ENCOUNTER — Telehealth: Payer: Self-pay

## 2024-02-07 DIAGNOSIS — E0590 Thyrotoxicosis, unspecified without thyrotoxic crisis or storm: Secondary | ICD-10-CM

## 2024-02-07 MED ORDER — METHIMAZOLE 5 MG PO TABS
5.0000 mg | ORAL_TABLET | ORAL | 1 refills | Status: AC
Start: 2024-02-07 — End: ?
  Filled 2024-02-07 – 2024-06-13 (×3): qty 90, 210d supply, fill #0

## 2024-02-07 NOTE — Progress Notes (Unsigned)
 [facility]  [name], is a 58 y.o. female  ZOX:[id number]  WJX:[id number]  DOB - [birth year]       Subjective:   Rebecca [name] is a 58 y.o. female here today for a follow up visit. Patient has No headache, No chest pain, No abdominal pain - No Nausea, No new weakness tingling or numbness, No Cough - shortness of breath HPI  No problems updated.  Comprehensive ROS Pertinent positive and negative noted in HPI   No Known Allergies  Past Medical History:  Diagnosis Date   Anemia    Diabetes mellitus    Dyslipidemia    Hypertension    Non-STEMI (non-ST elevated myocardial infarction) [facility])    Medical therapy for distal LAD dissection   Thyroid  disease     Current Outpatient Medications on File Prior to Visit  Medication Sig Dispense Refill   amLODipine  (NORVASC ) 10 MG tablet Take 1 tablet (10 mg total) by mouth daily. 90 tablet 1   aspirin  81 MG EC tablet Take 1 tablet (81 mg total) by mouth daily. 90 tablet 1   atorvastatin  (LIPITOR ) 40 MG tablet Take 1 tablet (40 mg total) by mouth daily. 90 tablet 1   Dulaglutide  (TRULICITY ) 3 MG/0.5ML SOAJ Inject 3 mg into the skin once a week. 6 mL 3   gabapentin  (NEURONTIN ) 400 MG capsule Take 1 capsule (400 mg total) by mouth 4 (four) times daily. 360 capsule 1   Insulin  Glargine (BASAGLAR  KWIKPEN) 100 UNIT/ML Inject 65 Units into the skin daily. 18 mL 2   insulin  lispro (HUMALOG  KWIKPEN) 100 UNIT/ML KwikPen Inject 10 Units into the skin 2 (two) times daily before lunch and supper. 15 mL 3   Insulin  Pen Needle 31G X 8 MM MISC Inject 1 each into the skin at bedtime. 90 each 1   losartan  (COZAAR ) 100 MG tablet Take 1 tablet (100 mg total) by mouth daily. 90 tablet 1   No current facility-administered medications on file prior to visit.   Health Maintenance  Topic Date Due   Eye exam for diabetics  Never done   Cologuard (Stool DNA test)  Never done   Mammogram  04/25/2023   COVID-19 Vaccine (3 -  2024-25 season) 05/23/2023   Flu Shot  04/21/2024   Complete foot exam   04/28/2024   Yearly kidney health urinalysis for diabetes  08/05/2024   Hemoglobin A1C  08/05/2024   Yearly kidney function blood test for diabetes  02/02/2025   Pap with HPV screening  02/23/2026   DTaP/Tdap/Td vaccine (3 - Td or Tdap) 05/13/2032   Pneumococcal Vaccination  Completed   Hepatitis C Screening  Completed   HIV Screening  Completed   Zoster (Shingles) Vaccine  Completed   HPV Vaccine  Aged Out   Meningitis B Vaccine  Aged Out    Objective:  There were no vitals filed for this visit. {Vitals History (Optional):23777}  Physical Exam  {Labs (Optional):23779}  Assessment & Plan  Hyperthyroidism -     methIMAzole ; Take 1 tablet (5 mg total) by mouth every Monday, Wednesday, and Friday.  Dispense: 90 tablet; Refill: 1     Patient have been counseled extensively about nutrition and exercise. Other issues discussed during this visit include: low cholesterol diet, weight control and daily exercise, foot care, annual eye examinations at Ophthalmology, importance of adherence with medications and regular follow-up. We also discussed long term complications of uncontrolled diabetes and hypertension.   No follow-ups on file.  The patient was  given clear instructions to go to ER or return to medical center if symptoms don't improve, worsen or new problems develop. The patient verbalized understanding. The patient was told to call to get lab results if they haven't heard anything in the next week.   This note has been created with Education officer, environmental. Any transcriptional errors are unintentional.   Marius Siemens, NP 02/07/2024, 7:51 PM

## 2024-02-07 NOTE — Telephone Encounter (Signed)
 Telephoned patient using language line interpreter#[id number]. No answer, voice mail not an option at this time. [facility]

## 2024-02-08 ENCOUNTER — Other Ambulatory Visit: Payer: Self-pay

## 2024-02-09 ENCOUNTER — Ambulatory Visit (INDEPENDENT_AMBULATORY_CARE_PROVIDER_SITE_OTHER): Payer: Self-pay | Admitting: Primary Care

## 2024-02-15 ENCOUNTER — Other Ambulatory Visit: Payer: Self-pay

## 2024-02-18 ENCOUNTER — Other Ambulatory Visit: Payer: Self-pay

## 2024-03-01 ENCOUNTER — Other Ambulatory Visit: Payer: Self-pay

## 2024-03-02 ENCOUNTER — Ambulatory Visit
Admission: RE | Admit: 2024-03-02 | Discharge: 2024-03-02 | Disposition: A | Discharge status: Home / Self Care | Payer: Self-pay | Source: Ambulatory Visit | Attending: Primary Care | Admitting: Primary Care

## 2024-03-02 DIAGNOSIS — Z1231 Encounter for screening mammogram for malignant neoplasm of breast: Secondary | ICD-10-CM

## 2024-03-28 ENCOUNTER — Other Ambulatory Visit: Payer: Self-pay

## 2024-04-14 ENCOUNTER — Ambulatory Visit: Admitting: Podiatry

## 2024-04-25 ENCOUNTER — Other Ambulatory Visit: Payer: Self-pay

## 2024-04-27 ENCOUNTER — Other Ambulatory Visit: Payer: Self-pay

## 2024-05-03 ENCOUNTER — Telehealth (INDEPENDENT_AMBULATORY_CARE_PROVIDER_SITE_OTHER): Payer: Self-pay | Admitting: Primary Care

## 2024-05-03 NOTE — Telephone Encounter (Signed)
 Called pt about appt. Pt did not answer and could not LVM. If pt does call back please advise that we need to reschedule appt.

## 2024-05-05 ENCOUNTER — Other Ambulatory Visit: Payer: Self-pay

## 2024-05-11 ENCOUNTER — Ambulatory Visit (INDEPENDENT_AMBULATORY_CARE_PROVIDER_SITE_OTHER): Payer: Self-pay | Admitting: Primary Care

## 2024-06-13 ENCOUNTER — Other Ambulatory Visit (INDEPENDENT_AMBULATORY_CARE_PROVIDER_SITE_OTHER): Payer: Self-pay | Admitting: Primary Care

## 2024-06-13 ENCOUNTER — Other Ambulatory Visit: Payer: Self-pay

## 2024-06-13 ENCOUNTER — Other Ambulatory Visit (HOSPITAL_COMMUNITY): Payer: Self-pay

## 2024-06-13 DIAGNOSIS — E1165 Type 2 diabetes mellitus with hyperglycemia: Secondary | ICD-10-CM

## 2024-06-13 MED ORDER — BASAGLAR KWIKPEN 100 UNIT/ML ~~LOC~~ SOPN
65.0000 [IU] | PEN_INJECTOR | Freq: Every day | SUBCUTANEOUS | 2 refills | Status: DC
  Filled 2024-06-13 (×2): qty 18, 27d supply, fill #0
  Filled 2024-07-19 (×2): qty 18, 27d supply, fill #1
  Filled 2024-08-23 (×2): qty 18, 27d supply, fill #2

## 2024-06-14 ENCOUNTER — Encounter: Payer: Self-pay | Admitting: Podiatry

## 2024-06-14 ENCOUNTER — Other Ambulatory Visit: Payer: Self-pay

## 2024-06-14 ENCOUNTER — Ambulatory Visit: Payer: Self-pay | Admitting: Podiatry

## 2024-06-14 ENCOUNTER — Ambulatory Visit (INDEPENDENT_AMBULATORY_CARE_PROVIDER_SITE_OTHER)

## 2024-06-14 DIAGNOSIS — M869 Osteomyelitis, unspecified: Secondary | ICD-10-CM

## 2024-06-14 DIAGNOSIS — M86172 Other acute osteomyelitis, left ankle and foot: Secondary | ICD-10-CM

## 2024-06-14 MED ORDER — DOXYCYCLINE HYCLATE 100 MG PO TABS: 100.0000 mg | ORAL_TABLET | Freq: Two times a day (BID) | ORAL | 1 refills | Status: DC

## 2024-06-14 NOTE — Patient Instructions (Signed)
Instrucciones de remojo  El dia despues del procedimiento:  Coloque 1/4 taza de sal de epsom en un litro de agua tibia del grifo. Sumerja su pie o pies con el vendaje externo intacto para el remojo inicial; esto permitira que el vendaje se humdezca y humedezca  para despegarlo facilmente. Una ves que retire su vendaje, continue remojando la solucion durante 20 minutos. Este remojo deber hacerse dos veces al dia. Luego, retire su pie o pies de la solucion, seque el area afectada y cubra. Puede usar una curita lo suficientemente grande como para cubrir el area o usar una gasa y cinta adhesive. Aplique otros medicamentos en el area segun las indicaciones del medico, como la polisporina neosporina.    SI SU PIEL SE IRRITA AL USAR ESTAS INSTRUCCIONES, ES ACEPTABLE CAMBIARSE AL VINAGRE BLANCO Y AL AGUA. O puede usar agua y jabon antibacterial para mantener limpio el dedo del pie.   Monitoree cualquier signo/sintoma de infeccion. Llame a la oficina de inmediato si occure o vaya directament a la sal de emergencias. Llame con cualquier pregunta/inquitud.  Instrucciones de cuidado a largo plazco: cirugia post clavo; Le han tratado la una encarnada y la raiz con un quimico. Este quimico causa una quemadura que drenara y supurara como una ampolla. Esto puede drenar durante 6-8 semana o mas. Es importante mantener esta area limpia, cubierta y seguir las intrucciones de remojo distribuidas al momento de la cirugia. Esta area finalmente se secara y formara una costra. Una ves que se forma la costra, ya no necesita remojar o aplicar un aposito. Si en algun momento experimenta un aumento en el dolor, enrojecimiento, hinchazon o drenaje, debe comunicarse con la oficina lo antes posible.   

## 2024-06-15 NOTE — Progress Notes (Signed)
 Subjective:   Patient ID: Rebecca [name], female   DOB: 58 y.o.   MRN: [id number]   HPI Patient presents with redness of the second toe left foot with history of diabetes and does have interpreter with her.  Does not remember injury and states it just started around 5 days ago.  Patient is not sure about her sugar but seems to indicate it is in reasonably good control although was not able to tell me specifics   ROS      Objective:  Physical Exam  Neurovascular status was found to be intact with patient noted to have inflammation of the second digit left foot with trauma to the nailbed and no current breakdown of tissue.  Patient has no proximal edema erythema or drainage noted currently     Assessment:  Trauma to the left second toe with low-grade infection with patient having diabetes and not understanding control     Plan:  H&P x-ray reviewed and at this point I am placing her on doxycycline  I dispensed surgical shoe and I advised on soaks.  I am hopeful this will heal uneventfully she has no current fever but I did recommend that she take her temperature every day and if she develop any systemic signs of infection she should go straight to the emergency room and she does understand she runs the risk of amputation of this digit given her history of diabetes not in good control and the injury sustained.  Hopeful that this will heal uneventfully  X-rays indicate possibility of low-grade osteolysis distal but nothing of significant nature currently

## 2024-06-18 ENCOUNTER — Other Ambulatory Visit: Payer: Self-pay

## 2024-06-18 ENCOUNTER — Observation Stay (HOSPITAL_COMMUNITY)
Admission: EM | Admit: 2024-06-18 | Discharge: 2024-06-19 | Disposition: A | Discharge status: Home / Self Care | Payer: Self-pay | Source: Ambulatory Visit | Attending: Family Medicine | Admitting: Family Medicine

## 2024-06-18 ENCOUNTER — Observation Stay (HOSPITAL_COMMUNITY)

## 2024-06-18 ENCOUNTER — Encounter (HOSPITAL_COMMUNITY): Payer: Self-pay | Admitting: Emergency Medicine

## 2024-06-18 ENCOUNTER — Emergency Department (HOSPITAL_COMMUNITY)

## 2024-06-18 DIAGNOSIS — E0590 Thyrotoxicosis, unspecified without thyrotoxic crisis or storm: Secondary | ICD-10-CM | POA: Diagnosis present

## 2024-06-18 DIAGNOSIS — L03119 Cellulitis of unspecified part of limb: Secondary | ICD-10-CM

## 2024-06-18 DIAGNOSIS — N183 Chronic kidney disease, stage 3 unspecified: Secondary | ICD-10-CM | POA: Diagnosis present

## 2024-06-18 DIAGNOSIS — Z7982 Long term (current) use of aspirin: Secondary | ICD-10-CM | POA: Insufficient documentation

## 2024-06-18 DIAGNOSIS — Z794 Long term (current) use of insulin: Secondary | ICD-10-CM | POA: Insufficient documentation

## 2024-06-18 DIAGNOSIS — M869 Osteomyelitis, unspecified: Secondary | ICD-10-CM | POA: Diagnosis present

## 2024-06-18 DIAGNOSIS — I129 Hypertensive chronic kidney disease with stage 1 through stage 4 chronic kidney disease, or unspecified chronic kidney disease: Secondary | ICD-10-CM | POA: Insufficient documentation

## 2024-06-18 DIAGNOSIS — E11628 Type 2 diabetes mellitus with other skin complications: Principal | ICD-10-CM | POA: Diagnosis present

## 2024-06-18 DIAGNOSIS — Z8739 Personal history of other diseases of the musculoskeletal system and connective tissue: Secondary | ICD-10-CM

## 2024-06-18 DIAGNOSIS — E039 Hypothyroidism, unspecified: Secondary | ICD-10-CM | POA: Insufficient documentation

## 2024-06-18 DIAGNOSIS — E1165 Type 2 diabetes mellitus with hyperglycemia: Secondary | ICD-10-CM | POA: Insufficient documentation

## 2024-06-18 DIAGNOSIS — E1122 Type 2 diabetes mellitus with diabetic chronic kidney disease: Secondary | ICD-10-CM | POA: Insufficient documentation

## 2024-06-18 DIAGNOSIS — N1831 Chronic kidney disease, stage 3a: Secondary | ICD-10-CM | POA: Insufficient documentation

## 2024-06-18 DIAGNOSIS — E1142 Type 2 diabetes mellitus with diabetic polyneuropathy: Secondary | ICD-10-CM | POA: Insufficient documentation

## 2024-06-18 DIAGNOSIS — I2510 Atherosclerotic heart disease of native coronary artery without angina pectoris: Secondary | ICD-10-CM | POA: Diagnosis present

## 2024-06-18 DIAGNOSIS — I10 Essential (primary) hypertension: Secondary | ICD-10-CM | POA: Diagnosis present

## 2024-06-18 DIAGNOSIS — E785 Hyperlipidemia, unspecified: Secondary | ICD-10-CM | POA: Insufficient documentation

## 2024-06-18 DIAGNOSIS — Z79899 Other long term (current) drug therapy: Secondary | ICD-10-CM | POA: Insufficient documentation

## 2024-06-18 DIAGNOSIS — L089 Local infection of the skin and subcutaneous tissue, unspecified: Principal | ICD-10-CM

## 2024-06-18 DIAGNOSIS — L03115 Cellulitis of right lower limb: Secondary | ICD-10-CM | POA: Insufficient documentation

## 2024-06-18 LAB — URINALYSIS, W/ REFLEX TO CULTURE (INFECTION SUSPECTED)
Bacteria, UA: NONE SEEN
Bilirubin Urine: NEGATIVE
Glucose, UA: >=500 mg/dL — AB
Hgb urine dipstick: NEGATIVE
Ketones, ur: 5 mg/dL — AB
Leukocytes,Ua: NEGATIVE
Nitrite: NEGATIVE
Protein, ur: >=300 mg/dL — AB
Specific Gravity, Urine: 1.025 (ref 1.005–1.030)
pH: 5 (ref 5.0–8.0)

## 2024-06-18 LAB — COMPREHENSIVE METABOLIC PANEL WITH GFR
ALT: 32 U/L (ref 0–44)
AST: 39 U/L (ref 15–41)
Albumin: 3.7 g/dL (ref 3.5–5.0)
Alkaline Phosphatase: 130 U/L — ABNORMAL HIGH (ref 38–126)
Anion gap: 14 (ref 5–15)
BUN: 15 mg/dL (ref 6–20)
CO2: 22 mmol/L (ref 22–32)
Calcium: 9.5 mg/dL (ref 8.9–10.3)
Chloride: 100 mmol/L (ref 98–111)
Creatinine, Ser: 1.35 mg/dL — ABNORMAL HIGH (ref 0.44–1.00)
GFR, Estimated: 46 mL/min — ABNORMAL LOW (ref >60)
Glucose, Bld: 289 mg/dL — ABNORMAL HIGH (ref 70–99)
Potassium: 3.9 mmol/L (ref 3.5–5.1)
Sodium: 136 mmol/L (ref 135–145)
Total Bilirubin: 1.1 mg/dL (ref 0.0–1.2)
Total Protein: 8.1 g/dL (ref 6.5–8.1)

## 2024-06-18 LAB — HEMOGLOBIN A1C
Hgb A1c MFr Bld: 12.3 % — ABNORMAL HIGH (ref 4.8–5.6)
Mean Plasma Glucose: 306.31 mg/dL

## 2024-06-18 LAB — CBC WITH DIFFERENTIAL/PLATELET
Abs Immature Granulocytes: 0.02 K/uL (ref 0.00–0.07)
Basophils Absolute: 0.1 K/uL (ref 0.0–0.1)
Basophils Relative: 1 %
Eosinophils Absolute: 0.1 K/uL (ref 0.0–0.5)
Eosinophils Relative: 1 %
HCT: 39.2 % (ref 36.0–46.0)
Hemoglobin: 12.8 g/dL (ref 12.0–15.0)
Immature Granulocytes: 0 %
Lymphocytes Relative: 26 %
Lymphs Abs: 1.8 K/uL (ref 0.7–4.0)
MCH: 27 pg (ref 26.0–34.0)
MCHC: 32.7 g/dL (ref 30.0–36.0)
MCV: 82.7 fL (ref 80.0–100.0)
Monocytes Absolute: 0.4 K/uL (ref 0.1–1.0)
Monocytes Relative: 6 %
Neutro Abs: 4.6 K/uL (ref 1.7–7.7)
Neutrophils Relative %: 66 %
Platelets: 344 K/uL (ref 150–400)
RBC: 4.74 MIL/uL (ref 3.87–5.11)
RDW: 12.8 % (ref 11.5–15.5)
WBC: 7 K/uL (ref 4.0–10.5)
nRBC: 0 % (ref 0.0–0.2)

## 2024-06-18 LAB — SEDIMENTATION RATE: Sed Rate: 60 mm/h — ABNORMAL HIGH (ref 0–22)

## 2024-06-18 LAB — GLUCOSE, CAPILLARY
Glucose-Capillary: 337 mg/dL — ABNORMAL HIGH (ref 70–99)
Glucose-Capillary: 155 mg/dL — ABNORMAL HIGH (ref 70–99)

## 2024-06-18 LAB — I-STAT CG4 LACTIC ACID, ED: Lactic Acid, Venous: 1.6 mmol/L (ref 0.5–1.9)

## 2024-06-18 LAB — CBG MONITORING, ED: Glucose-Capillary: 247 mg/dL — ABNORMAL HIGH (ref 70–99)

## 2024-06-18 MED ORDER — GABAPENTIN 400 MG PO CAPS
400.0000 mg | ORAL_CAPSULE | Freq: Four times a day (QID) | ORAL | Status: DC
Start: 2024-06-18 — End: 2024-06-19
  Administered 2024-06-18 – 2024-06-19 (×2): 400 mg via ORAL
  Filled 2024-06-18 (×2): qty 1

## 2024-06-18 MED ORDER — PIPERACILLIN-TAZOBACTAM 3.375 G IVPB
3.3750 g | Freq: Three times a day (TID) | INTRAVENOUS | Status: DC
Start: 2024-06-18 — End: 2024-06-20
  Administered 2024-06-18 – 2024-06-19 (×3): 3.375 g via INTRAVENOUS
  Filled 2024-06-18 (×3): qty 50

## 2024-06-18 MED ORDER — ENOXAPARIN SODIUM 40 MG/0.4ML IJ SOSY
40.0000 mg | PREFILLED_SYRINGE | INTRAMUSCULAR | Status: DC
  Administered 2024-06-18: 40 mg via SUBCUTANEOUS
  Filled 2024-06-18: qty 0.4

## 2024-06-18 MED ORDER — ACETAMINOPHEN 325 MG PO TABS
650.0000 mg | ORAL_TABLET | Freq: Four times a day (QID) | ORAL | Status: DC | PRN
  Administered 2024-06-18: 650 mg via ORAL
  Filled 2024-06-18: qty 2

## 2024-06-18 MED ORDER — SODIUM CHLORIDE 0.9% FLUSH
3.0000 mL | Freq: Two times a day (BID) | INTRAVENOUS | Status: DC
  Administered 2024-06-18 – 2024-06-19 (×2): 3 mL via INTRAVENOUS

## 2024-06-18 MED ORDER — ONDANSETRON HCL 4 MG/2ML IJ SOLN: 4.0000 mg | Freq: Four times a day (QID) | INTRAMUSCULAR | Status: DC | PRN

## 2024-06-18 MED ORDER — ONDANSETRON HCL 4 MG/2ML IJ SOLN
4.0000 mg | Freq: Once | INTRAMUSCULAR | Status: AC
  Administered 2024-06-18: 4 mg via INTRAVENOUS
  Filled 2024-06-18: qty 2

## 2024-06-18 MED ORDER — SODIUM CHLORIDE 0.9 % IV BOLUS
500.0000 mL | Freq: Once | INTRAVENOUS | Status: AC
  Administered 2024-06-18: 500 mL via INTRAVENOUS

## 2024-06-18 MED ORDER — ASPIRIN 81 MG PO TBEC
81.0000 mg | DELAYED_RELEASE_TABLET | Freq: Every day | ORAL | Status: DC
  Administered 2024-06-18 – 2024-06-19 (×2): 81 mg via ORAL
  Filled 2024-06-18 (×2): qty 1

## 2024-06-18 MED ORDER — ALBUTEROL SULFATE (2.5 MG/3ML) 0.083% IN NEBU: 2.5000 mg | INHALATION_SOLUTION | Freq: Four times a day (QID) | RESPIRATORY_TRACT | Status: DC | PRN

## 2024-06-18 MED ORDER — DIPHENHYDRAMINE HCL 50 MG/ML IJ SOLN
12.5000 mg | Freq: Four times a day (QID) | INTRAMUSCULAR | Status: DC | PRN
  Administered 2024-06-18: 12.5 mg via INTRAVENOUS
  Filled 2024-06-18: qty 1

## 2024-06-18 MED ORDER — METHIMAZOLE 5 MG PO TABS
5.0000 mg | ORAL_TABLET | ORAL | Status: DC
  Administered 2024-06-19: 5 mg via ORAL
  Filled 2024-06-18: qty 1

## 2024-06-18 MED ORDER — ONDANSETRON HCL 4 MG PO TABS: 4.0000 mg | ORAL_TABLET | Freq: Four times a day (QID) | ORAL | Status: DC | PRN

## 2024-06-18 MED ORDER — PIPERACILLIN-TAZOBACTAM 3.375 G IVPB 30 MIN: 3.3750 g | Freq: Three times a day (TID) | INTRAVENOUS | Status: DC

## 2024-06-18 MED ORDER — ATORVASTATIN CALCIUM 40 MG PO TABS
40.0000 mg | ORAL_TABLET | Freq: Every day | ORAL | Status: DC
  Administered 2024-06-18 – 2024-06-19 (×2): 40 mg via ORAL
  Filled 2024-06-18 (×2): qty 1

## 2024-06-18 MED ORDER — INSULIN GLARGINE 100 UNIT/ML ~~LOC~~ SOLN
50.0000 [IU] | SUBCUTANEOUS | Status: DC
Start: 2024-06-18 — End: 2024-06-20
  Administered 2024-06-18: 50 [IU] via SUBCUTANEOUS
  Filled 2024-06-18 (×3): qty 0.5

## 2024-06-18 MED ORDER — AMLODIPINE BESYLATE 10 MG PO TABS
10.0000 mg | ORAL_TABLET | Freq: Every day | ORAL | Status: DC
  Administered 2024-06-18 – 2024-06-19 (×2): 10 mg via ORAL
  Filled 2024-06-18 (×2): qty 1

## 2024-06-18 MED ORDER — LOSARTAN POTASSIUM 50 MG PO TABS
100.0000 mg | ORAL_TABLET | Freq: Every day | ORAL | Status: DC
Start: 2024-06-18 — End: 2024-06-19
  Administered 2024-06-18 – 2024-06-19 (×2): 100 mg via ORAL
  Filled 2024-06-18 (×2): qty 2

## 2024-06-18 MED ORDER — INSULIN ASPART 100 UNIT/ML IJ SOLN: 0.0000 [IU] | Freq: Every day | INTRAMUSCULAR | Status: DC

## 2024-06-18 MED ORDER — ACETAMINOPHEN 650 MG RE SUPP: 650.0000 mg | Freq: Four times a day (QID) | RECTAL | Status: DC | PRN

## 2024-06-18 MED ORDER — INSULIN ASPART 100 UNIT/ML IJ SOLN
0.0000 [IU] | Freq: Three times a day (TID) | INTRAMUSCULAR | Status: DC
  Administered 2024-06-18: 11 [IU] via SUBCUTANEOUS
  Administered 2024-06-19: 2 [IU] via SUBCUTANEOUS

## 2024-06-18 MED ORDER — PIPERACILLIN-TAZOBACTAM 3.375 G IVPB 30 MIN
3.3750 g | Freq: Once | INTRAVENOUS | Status: AC
  Administered 2024-06-18: 3.375 g via INTRAVENOUS
  Filled 2024-06-18: qty 50

## 2024-06-18 NOTE — ED Triage Notes (Addendum)
 Pt here for NV, lightheadedness fever and exhaustion related to foot wound on 2nd toe.Symptoms began Sat morning. Being seen by [facility]. On an ABX.  PT does endorse an itchy rash on her legs since beginning abx.

## 2024-06-18 NOTE — Plan of Care (Signed)

## 2024-06-18 NOTE — Consult Note (Signed)
 PODIATRY CONSULTATION  NAME Rebecca [name] MRN [id number] DOB September 18, 1966 DOA 06/18/2024   Reason for consult:  Chief Complaint  Patient presents with   Nausea   Emesis   Fever   Foot Pain   Fatigue    Attending/Consulting physician:  Dr. Maximino Sharps, MD History of present illness: 58 y.o. female with past medical history of type 2 diabetes, hypertension, NSTEMI, CAD, AKI, hyperlipidemia, hypothyroidism presenting with concern for worsening left second toe amputation.  She saw Dr. Magdalen on 9/24 after several days of concern for possible nail bed trauma, possible developing infection and was started on doxycycline .  Since then she reports worsening redness and swelling to the toe.  Also reports that she thinks she could have had a reaction to the doxycycline  as she has been dealing with nausea, vomiting, fever and chills over the past 24 hours.  Per EMR, most recent A1c from May 2025 is 8.8.    Past Medical History:  Diagnosis Date   Anemia    Diabetes mellitus    Dyslipidemia    Hypertension    Non-STEMI (non-ST elevated myocardial infarction) [facility])    Medical therapy for distal LAD dissection   Thyroid  disease        Latest Ref Rng & Units 06/18/2024    9:29 AM 02/03/2024   11:18 AM 07/19/2023    7:55 PM  CBC  WBC 4.0 - 10.5 K/uL 7.0  8.1  13.5   Hemoglobin 12.0 - 15.0 g/dL 87.1  88.5  89.2   Hematocrit 36.0 - 46.0 % 39.2  35.6  33.8   Platelets 150 - 400 K/uL 344  335  264        Latest Ref Rng & Units 06/18/2024    9:29 AM 02/03/2024   11:18 AM 07/19/2023    7:55 PM  BMP  Glucose 70 - 99 mg/dL 710  891  841   BUN 6 - 20 mg/dL 15  23  28    Creatinine 0.44 - 1.00 mg/dL 8.64  8.87  8.14   BUN/Creat Ratio 9 - 23  21    Sodium 135 - 145 mmol/L 136  142  137   Potassium 3.5 - 5.1 mmol/L 3.9  4.8  4.5   Chloride 98 - 111 mmol/L 100  106  104   CO2 22 - 32 mmol/L 22  23  23    Calcium  8.9 - 10.3 mg/dL 9.5  9.6  9.2       Physical Exam: Lower  Extremity Exam Vasc: DP and PT pulses palpable bilaterally.  Capillary refill approximately 3 to 5 seconds to the digits.  Decreased pedal hair growth.  Increased warmth and erythema present to the left second toe and forefoot.  Derm: Left second toe erythematous, somewhat dusky in appearance.  Small ulceration present distal tip of the toe.  Proximal base of the nail bed scabbed over.  Left forefoot edema present.  MSK:  Hammertoe contractures of lesser digits.  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  Neuro: Decreased light touch and protective sensation.      Left foot xr 9/28 Narrative & Impression  EXAM: 3 OR MORE VIEW(S) XRAY OF THE LEFT FOOT 06/18/2024 10:33:00 AM   COMPARISON: None available.   CLINICAL HISTORY: pain / infection - left 2nd toe.   FINDINGS:   BONES AND JOINTS: Advanced degenerative changes of the first tarsometatarsal joint. Slight increased sclerosis involving the midshaft of the second proximal phalanx similar to previous exam.  No focal bone erosion or bony destruction identified. No acute fractures or subluxations. No joint dislocation.   SOFT TISSUES: Soft tissue swelling of the second toe. Vascular calcifications.   IMPRESSION: 1. Soft tissue swelling of the second toe. 2. Slight increased sclerosis of the midshaft of the second proximal phalanx, which may reflect sequelae of chronic osteomyelitis. No signs of bony erosion or bony destruction to indicate acute osteomyelitis. 3. No acute fractures or subluxations.   Electronically signed by: Waddell Calk MD 06/18/2024 10:59 AM EDT RP Workstation: [id number]    ASSESSMENT/PLAN OF CARE 58 y.o. female with PMHx significant for  type 2 diabetes, hypertension, NSTEMI, CAD, AKI, hyperlipidemia, hypothyroidism with concern for left second toe cellulitis with distal ulceration  - Findings reviewed with patient.  Spanish interpreter assisted with encounter -No leukocytosis presently.  Sed rate  ordered.  Adding CRP -Ordering MRI to evaluate for underlying osteomyelitis of the 2nd toe -Arterial duplex ultrasound ordered, calcification seen on x-ray, does have palpable pulses but do suspect underlying element of vascular disease. - Continue IV abx broad spectrum pending further culture data - Anticoagulation: Lovenox  - Wound care: Betadine wet-to-dry dressing applied, change daily - WB status: Weightbearing as tolerated in surgical shoe - Will continue to follow   Thank you for the consult.  Please contact me directly with any questions or concerns.           Ethan Saddler, DPM [facility] / [facility]    [address] [place], [place] [address]                Office [phone]  Fax [phone]

## 2024-06-18 NOTE — ED Notes (Signed)
 Pt going to MRI

## 2024-06-18 NOTE — H&P (Signed)
 History and Physical    Patient: Rebecca [name] FMW:[id number] DOB: 20-Dec-1965 DOA: 06/18/2024 DOS: the patient was seen and examined on 06/18/2024 PCP: Celestia Rosaline SQUIBB, NP  Patient coming from: Home  Chief Complaint:  Chief Complaint  Patient presents with   Nausea   Emesis   Fever   Foot Pain   Fatigue   HPI: Rebecca [name] is a 58 y.o. Spanish-speaking female with medical history significant of hypertension, hyperlipidemia, CAD with remote history of LAD dissection, diabetes mellitus type 2, hyperthyroidism, and CKD stage III, s/p prior right great toe amputation presents with a blackened left second toe following redness and pain.  Approximately ten days ago, she noticed redness on her toe after trying on shoes. Initially, there was no pain, but two to three days later, she began experiencing pain in the affected area.  She visited Dr. Magdalen of podiatrist on Friday, who prescribed antibiotics due to concerns of infection. However, she only took two doses of doxycycline  as they caused vomiting. Since then, she has experienced chills and noted that the toe, initially red, has turned black.  She has a history of diabetes, which is not well-controlled as she does not regularly check her blood sugar levels. She previously had a great toe amputated a year before the pandemic and had two more toes removed during the pandemic due to infection.  She is on medication for blood pressure but did not take her dose today. She has not had any Doppler ultrasounds or vascular studies done on her legs previously.  She had been seen by podiatry started on doxycycline  on the 9/26.  In the ED patient was noted to be afebrile with heart rates elevated up to 117, blood pressures 160/73, and all other vital signs maintained.  Labs noted CBC with differential within normal limits, lactic acid 1.6, BUN 15, creatinine 1.35, glucose 289, and alkaline phosphatase 130.  X-rays of the left foot  noted soft tissue swelling of the second toe with slight increased sclerosis of the midshaft of the second proximal phalanx thought to reflect sequela of chronic osteomyelitis with no signs of bony erosion or obstruction to indicate acute osteomyelitis.  Blood cultures were obtained.  Patient had been given 500 mL bolus of IV fluids, Zofran , and Zosyn .  Review of Systems: As mentioned in the history of present illness. All other systems reviewed and are negative. Past Medical History:  Diagnosis Date   Anemia    Diabetes mellitus    Dyslipidemia    Hypertension    Non-STEMI (non-ST elevated myocardial infarction) [facility])    Medical therapy for distal LAD dissection   Thyroid  disease    Past Surgical History:  Procedure Laterality Date   AMPUTATION Right 07/21/2017   Procedure: RIGHT GREAT TOE AMPUTATION;  Surgeon: Harden Jerona GAILS, MD;  Location: [facility] OR;  Service: Orthopedics;  Laterality: Right;   AMPUTATION Right 07/05/2020   Procedure: RIGHT FOOT 2ND RAY AMPUTATION;  Surgeon: Harden Jerona GAILS, MD;  Location: [facility] OR;  Service: Orthopedics;  Laterality: Right;   CARDIAC CATHETERIZATION N/A 05/14/2015   Procedure: Left Heart Cath and Coronary Angiography;  Surgeon: Alm LELON Clay, MD; dLAD 60%>>95% (small vessel, ?dissection), CFX & RCA systems no sig dz, EF & LVEDP nl      Social History:  reports that she has never smoked. She has never used smokeless tobacco. She reports that she does not drink alcohol and does not use drugs.  No Known Allergies  Family History  Problem Relation Age of Onset   Cancer Mother    Hypertension Father    Hyperlipidemia Father    Diabetes Sister    Diabetes Sister    Diabetes Sister    Diabetes Sister    Diabetes Brother    Diabetes Brother    Diabetes Brother    Diabetes Brother    Diabetes Brother    Breast cancer Neg Hx     Prior to Admission medications   Medication Sig Start Date End Date Taking? Authorizing Provider  amLODipine  (NORVASC ) 10 MG  tablet Take 1 tablet (10 mg total) by mouth daily. 02/03/24   Celestia Rosaline SQUIBB, NP  aspirin  81 MG EC tablet Take 1 tablet (81 mg total) by mouth daily. 10/03/19   Celestia Rosaline SQUIBB, NP  atorvastatin  (LIPITOR ) 40 MG tablet Take 1 tablet (40 mg total) by mouth daily. 02/03/24   Celestia Rosaline SQUIBB, NP  doxycycline  (VIBRA -TABS) 100 MG tablet Take 1 tablet (100 mg total) by mouth 2 (two) times daily. 06/14/24   Magdalen Pasco RAMAN, DPM  Dulaglutide  (TRULICITY ) 3 MG/0.5ML SOAJ Inject 3 mg into the skin once a week. 01/19/24   Celestia Rosaline SQUIBB, NP  gabapentin  (NEURONTIN ) 400 MG capsule Take 1 capsule (400 mg total) by mouth 4 (four) times daily. 02/03/24   Celestia Rosaline SQUIBB, NP  Insulin  Glargine (BASAGLAR  KWIKPEN) 100 UNIT/ML Inject 65 Units into the skin daily. 06/13/24   Celestia Rosaline SQUIBB, NP  insulin  lispro (HUMALOG  KWIKPEN) 100 UNIT/ML KwikPen Inject 10 Units into the skin 2 (two) times daily before lunch and supper. 02/03/24   Celestia Rosaline SQUIBB, NP  Insulin  Pen Needle 31G X 8 MM MISC Inject 1 each into the skin at bedtime. 02/03/21   Celestia Rosaline SQUIBB, NP  losartan  (COZAAR ) 100 MG tablet Take 1 tablet (100 mg total) by mouth daily. 02/03/24   Celestia Rosaline SQUIBB, NP  methimazole  (TAPAZOLE ) 5 MG tablet Take 1 tablet (5 mg total) by mouth every Monday, Wednesday, and Friday. 02/07/24   Celestia Rosaline SQUIBB, NP    Physical Exam: Vitals:   06/18/24 1007 06/18/24 1015 06/18/24 1018 06/18/24 1100  BP:  (!) 160/73  (!) 157/87  Pulse:  99  99  Resp:   16 11  Temp:      SpO2:  100%  100%  Weight: 77.1 kg     Height: 5' 2 (1.575 m)      Constitutional: Early female currently in no acute distress Eyes: PERRL, lids and conjunctivae normal ENMT: Mucous membranes are moist.  Normal dentition.  Neck: normal, supple  Respiratory: clear to auscultation bilaterally, no wheezing, no crackles. Normal respiratory effort. No accessory muscle use.  Cardiovascular: Regular rate and rhythm, no murmurs / rubs /  gallops. No extremity edema. 2+ pedal pulses.   Abdomen: no tenderness, no masses palpated.  Bowel sounds positive.  Musculoskeletal: no clubbing / cyanosis.  Ray amputation of the right foot Skin: Left second toe with erythema present and distal ulceration present as seen below Neurologic: CN 2-12 grossly intact. Strength 5/5 in all 4.  Psychiatric: Normal judgment and insight. Alert and oriented x 3. Normal mood.   Data Reviewed:  I reviewed labs, imaging, and pertinent records as documented.  Assessment and Plan:  Cellulitis in a diabetic foot Suspected osteomyelitis Acute.  Patient presents for the second left toe infection with reports of burning appearance of the toe.  Seen by podiatry on 9/26 and started on doxycycline  but unable to tolerate the  medication with reports of nausea and vomiting only taking 2 doses.  X-rays revealed signs of chronic osteomyelitis with no signs to suggest acute osteomyelitis.  Prior ABI with TBI's were noted to be within normal limits back in 2018.  Blood cultures obtained and patient started on empiric antibiotics.  Podiatry had been formally consulted and ordered MRI of the left foot - Admit to MedSurg - Follow-up blood cultures - Add on ESR and CRP - Follow-up MRI - Continue empiric antibiotics of Zosyn   - Continue wound  dressing changes as recommended - Appreciate podiatry consultative services, will follow-up for any further recommendations.  Uncontrolled diabetes mellitus type 2, with hyperglycemia and peripheral neuropathy Patient reports poor adherence to diabetic regimen.  On admission glucose noted to be elevated up to 289. - Hypoglycemia protocol - Check hemoglobin A1c(12.8) - Would plan to continue Trulicity  in outpatient setting - Continue gabapentin  - Reduced Semglee  to 40 units daily - CBGs before every meal with moderate SSI - Adjust insulin  regimen as deemed medically appropriate-   Essential hypertension On admission blood  pressures were elevated up to 160/73 - Continue amlodipine  and losartan  - Hydralazine  IV as needed  CAD Hyperlipidemia Patient with prior history of NSTEMI with concern for spontaneous LAD dissection back in 2016 - Continue atorvastatin  - Resume aspirin  if not taking  History of osteomyelitis Status post right great toe amputation in 2018 and 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 Harden  Hyperthyroidism Free T4 noted to be 1.16 back in 02/03/2024 - Continue methimazole     Chronic kidney disease stage IIIa Creatinine noted to be 1.35 which appears slightly higher than previous but did not meet criteria for AKI. - Continue to monitor kidney function  DVT prophylaxis: Lovenox  Advance Care Planning:   Code Status: Full Code    Consults: Podiatry  Family Communication: Family updated at bedside Severity of Illness: The appropriate patient status for this patient is OBSERVATION. Observation status is judged to be reasonable and necessary in order to provide the required intensity of service to ensure the patient's safety. The patient's presenting symptoms, physical exam findings, and initial radiographic and laboratory data in the context of their medical condition is felt to place them at decreased risk for further clinical deterioration. Furthermore, it is anticipated that the patient will be medically stable for discharge from the hospital within 2 midnights of admission.   Author: Maximino DELENA Sharps, MD 06/18/2024 11:25 AM  For on call review [email or web address].

## 2024-06-18 NOTE — ED Provider Notes (Signed)
 [place] EMERGENCY DEPARTMENT AT [facility] Provider Note   CSN: [id number] Arrival date & time: 06/18/24  9082     Patient presents with: No chief complaint on file.   Rebecca [name] is a 58 y.o. female.   58-year female with prior medical history as detailed below presents for evaluation.  Patient with left second toe infection.  Patient was placed on doxycycline  by [facility] - Seen Dr. Magdalen on 9/25.  Patient is concerned that her left second toe is becoming more erythematous and the erythema is spreading into her foot.  She has been taking the doxycycline  as prescribed.  She has had a prior amputation on her right foot.  She is concerned that her toe may be in danger of needing to be amputated.  Over the last 24 hours she reports nausea, vomiting, subjective fever, chills.  Prior medical history includes diabetes, HHS, hypertension, NSTEMI, CAD, AKI, hypertension, hyperlipidemia, hypothyroidism.  The history is provided by the patient and medical records.       Prior to Admission medications   Medication Sig Start Date End Date Taking? Authorizing Provider  amLODipine  (NORVASC ) 10 MG tablet Take 1 tablet (10 mg total) by mouth daily. 02/03/24   Celestia Rosaline SQUIBB, NP  aspirin  81 MG EC tablet Take 1 tablet (81 mg total) by mouth daily. 10/03/19   Celestia Rosaline SQUIBB, NP  atorvastatin  (LIPITOR ) 40 MG tablet Take 1 tablet (40 mg total) by mouth daily. 02/03/24   Celestia Rosaline SQUIBB, NP  doxycycline  (VIBRA -TABS) 100 MG tablet Take 1 tablet (100 mg total) by mouth 2 (two) times daily. 06/14/24   Magdalen Pasco RAMAN, DPM  Dulaglutide  (TRULICITY ) 3 MG/0.5ML SOAJ Inject 3 mg into the skin once a week. 01/19/24   Celestia Rosaline SQUIBB, NP  gabapentin  (NEURONTIN ) 400 MG capsule Take 1 capsule (400 mg total) by mouth 4 (four) times daily. 02/03/24   Celestia Rosaline SQUIBB, NP  Insulin  Glargine (BASAGLAR  KWIKPEN) 100 UNIT/ML Inject 65 Units into the skin daily. 06/13/24    Celestia Rosaline SQUIBB, NP  insulin  lispro (HUMALOG  KWIKPEN) 100 UNIT/ML KwikPen Inject 10 Units into the skin 2 (two) times daily before lunch and supper. 02/03/24   Celestia Rosaline SQUIBB, NP  Insulin  Pen Needle 31G X 8 MM MISC Inject 1 each into the skin at bedtime. 02/03/21   Celestia Rosaline SQUIBB, NP  losartan  (COZAAR ) 100 MG tablet Take 1 tablet (100 mg total) by mouth daily. 02/03/24   Celestia Rosaline SQUIBB, NP  methimazole  (TAPAZOLE ) 5 MG tablet Take 1 tablet (5 mg total) by mouth every Monday, Wednesday, and Friday. 02/07/24   Celestia Rosaline SQUIBB, NP    Allergies: Patient has no known allergies.    Review of Systems  All other systems reviewed and are negative.   Updated Vital Signs BP (!) 158/106   Pulse (!) 117   Temp 99.1 F (37.3 C)   Resp 18   LMP  (LMP Unknown)   SpO2 100%   Physical Exam Vitals and nursing note reviewed.  Constitutional:      General: She is not in acute distress.    Appearance: Normal appearance. She is well-developed.  HENT:     Head: Normocephalic and atraumatic.  Eyes:     Conjunctiva/sclera: Conjunctivae normal.     Pupils: Pupils are equal, round, and reactive to light.  Cardiovascular:     Rate and Rhythm: Normal rate and regular rhythm.     Heart sounds: Normal  heart sounds.  Pulmonary:     Effort: Pulmonary effort is normal. No respiratory distress.     Breath sounds: Normal breath sounds.  Abdominal:     General: There is no distension.     Palpations: Abdomen is soft.     Tenderness: There is no abdominal tenderness.  Musculoskeletal:        General: No deformity. Normal range of motion.     Cervical back: Normal range of motion and neck supple.     Comments: Left second toe with erythema and a dusky tint to the skin.  See images below.  Skin:    General: Skin is warm and dry.  Neurological:     General: No focal deficit present.     Mental Status: She is alert and oriented to person, place, and time.     (all labs ordered are  listed, but only abnormal results are displayed) Labs Reviewed - No data to display  EKG: None  Radiology: No results found.   Procedures   Medications Ordered in the ED - No data to display                                  Medical Decision Making Patient with left second toe infection.  She has been on doxycycline  since September 25.  She reports worsening symptoms including nausea, vomiting, chills, subjective fever.  There is a concern for worsening infection despite use of outpatient doxycycline .  Will plan for admission for IV antibiotics.  Hospitalist service is aware of case.  [facility] - Dr. Lamount  -is also aware of case.  Amount and/or Complexity of Data Reviewed Labs: ordered. Radiology: ordered.  Risk Prescription drug management. Decision regarding hospitalization.        Final diagnoses:  Toe infection    ED Discharge Orders     None          Laurice Maude BROCKS, MD 06/18/24 1145

## 2024-06-19 ENCOUNTER — Observation Stay (HOSPITAL_COMMUNITY)

## 2024-06-19 ENCOUNTER — Other Ambulatory Visit (HOSPITAL_COMMUNITY): Payer: Self-pay

## 2024-06-19 DIAGNOSIS — E11628 Type 2 diabetes mellitus with other skin complications: Secondary | ICD-10-CM

## 2024-06-19 DIAGNOSIS — L03119 Cellulitis of unspecified part of limb: Secondary | ICD-10-CM

## 2024-06-19 LAB — CBC
HCT: 36.9 % (ref 36.0–46.0)
Hemoglobin: 12.1 g/dL (ref 12.0–15.0)
MCH: 27.2 pg (ref 26.0–34.0)
MCHC: 32.8 g/dL (ref 30.0–36.0)
MCV: 82.9 fL (ref 80.0–100.0)
Platelets: 318 K/uL (ref 150–400)
RBC: 4.45 MIL/uL (ref 3.87–5.11)
RDW: 12.9 % (ref 11.5–15.5)
WBC: 8.6 K/uL (ref 4.0–10.5)
nRBC: 0 % (ref 0.0–0.2)

## 2024-06-19 LAB — BASIC METABOLIC PANEL WITH GFR
Anion gap: 9 (ref 5–15)
BUN: 14 mg/dL (ref 6–20)
CO2: 26 mmol/L (ref 22–32)
Calcium: 8.9 mg/dL (ref 8.9–10.3)
Chloride: 107 mmol/L (ref 98–111)
Creatinine, Ser: 1.64 mg/dL — ABNORMAL HIGH (ref 0.44–1.00)
GFR, Estimated: 36 mL/min — ABNORMAL LOW (ref >60)
Glucose, Bld: 71 mg/dL (ref 70–99)
Potassium: 3.9 mmol/L (ref 3.5–5.1)
Sodium: 142 mmol/L (ref 135–145)

## 2024-06-19 LAB — C-REACTIVE PROTEIN: CRP: 0.5 mg/dL (ref <1.0)

## 2024-06-19 LAB — HIV ANTIBODY (ROUTINE TESTING W REFLEX): HIV Screen 4th Generation wRfx: NONREACTIVE

## 2024-06-19 LAB — VAS US ABI WITH/WO TBI
Left ABI: 1.04
Right ABI: 1.11

## 2024-06-19 LAB — GLUCOSE, CAPILLARY
Glucose-Capillary: 127 mg/dL — ABNORMAL HIGH (ref 70–99)
Glucose-Capillary: 107 mg/dL — ABNORMAL HIGH (ref 70–99)
Glucose-Capillary: 116 mg/dL — ABNORMAL HIGH (ref 70–99)

## 2024-06-19 MED ORDER — DOXYCYCLINE HYCLATE 100 MG PO TABS
100.0000 mg | ORAL_TABLET | Freq: Two times a day (BID) | ORAL | 0 refills | Status: DC
  Filled 2024-06-19: qty 14, 7d supply, fill #0

## 2024-06-19 MED ORDER — GABAPENTIN 100 MG PO CAPS
100.0000 mg | ORAL_CAPSULE | Freq: Three times a day (TID) | ORAL | Status: DC
Start: 2024-06-19 — End: 2024-06-20
  Administered 2024-06-19: 100 mg via ORAL
  Filled 2024-06-19: qty 1

## 2024-06-19 MED ORDER — SODIUM CHLORIDE 0.9 % IV BOLUS
1000.0000 mL | Freq: Once | INTRAVENOUS | Status: AC
  Administered 2024-06-19: 1000 mL via INTRAVENOUS

## 2024-06-19 MED ORDER — DIPHENHYDRAMINE HCL 12.5 MG/5ML PO ELIX: 12.5000 mg | ORAL_SOLUTION | Freq: Four times a day (QID) | ORAL | Status: DC | PRN

## 2024-06-19 MED ORDER — MUPIROCIN 2 % EX OINT: TOPICAL_OINTMENT | Freq: Every day | CUTANEOUS | Status: DC

## 2024-06-19 MED ORDER — AMOXICILLIN-POT CLAVULANATE 875-125 MG PO TABS
1.0000 | ORAL_TABLET | Freq: Two times a day (BID) | ORAL | 0 refills | Status: DC
  Filled 2024-06-19: qty 14, 7d supply, fill #0

## 2024-06-19 MED ORDER — LIVING WELL WITH DIABETES BOOK - IN SPANISH
Freq: Once | Status: AC
  Filled 2024-06-19: qty 1

## 2024-06-19 MED ORDER — SODIUM CHLORIDE 0.9 % IV SOLN: INTRAVENOUS | Status: DC

## 2024-06-19 NOTE — Progress Notes (Signed)
   PODIATRY PROGRESS NOTE Patient Name: Rebecca [name]  DOB September 18, 1966 DOA 06/18/2024  Hospital Day: 2  Assessment:  58 y.o. female with PMHx significant for  type 2 diabetes, hypertension, NSTEMI, CAD, AKI, hyperlipidemia, hypothyroidism with concern for left second toe cellulitis with distal ulceration   AF, VSS A1c 12.3 WBC: 8.6 ESR/CRP: 60/ 0.5  Wound/Bone Cultures: None taken  Imaging: MRI L foot: No evidence of OM L 2nd toe  Plan:  - Pt ok for DC home from my standpoint, will monitor the toe on oupatient basis. No evidence OM or PAD on LLE. - Recommend daily mupirocin  ointment and band aid dressing to L 2nd toe until follow up - Post op shoe ordered WBAT LLE  - Recommend 7 days doxycycline  and augmentin  on discharge - Pt needs better glycemic control or will be at risk of toe/ foot amputaiton in future.  - Will have office call to schedule follow up in 2 weeks. Will sign off please msg with questions.   Marolyn JULIANNA Honour, DPM [facility]    Subjective:  Discussed that MRI did not show bone infection and vascular studies normal.    Objective:   Vitals:   06/19/24 0812 06/19/24 1141  BP: 135/76 119/71  Pulse:  97  Resp:    Temp:  98 F (36.7 C)  SpO2:  100%       Latest Ref Rng & Units 06/19/2024    2:04 AM 06/18/2024    9:29 AM 02/03/2024   11:18 AM  CBC  WBC 4.0 - 10.5 K/uL 8.6  7.0  8.1   Hemoglobin 12.0 - 15.0 g/dL 87.8  87.1  88.5   Hematocrit 36.0 - 46.0 % 36.9  39.2  35.6   Platelets 150 - 400 K/uL 318  344  335        Latest Ref Rng & Units 06/19/2024    2:04 AM 06/18/2024    9:29 AM 02/03/2024   11:18 AM  BMP  Glucose 70 - 99 mg/dL 71  710  891   BUN 6 - 20 mg/dL 14  15  23    Creatinine 0.44 - 1.00 mg/dL 8.35  8.64  8.87   BUN/Creat Ratio 9 - 23   21   Sodium 135 - 145 mmol/L 142  136  142   Potassium 3.5 - 5.1 mmol/L 3.9  3.9  4.8   Chloride 98 - 111 mmol/L 107  100  106   CO2 22 - 32 mmol/L 26  22  23    Calcium  8.9 -  10.3 mg/dL 8.9  9.5  9.6     General: AAOx3, NAD  Lower Extremity Exam  L DP and PT pulses 2+  Sensation diminished to digit level  Small superficial ulceration 4mm diameter to distal aspect L 2nd toe, no significant edema or erythema of the toe     Radiology:  Results reviewed. See assessment for pertinent imaging results

## 2024-06-19 NOTE — Plan of Care (Signed)

## 2024-06-19 NOTE — Progress Notes (Signed)
 PROGRESS NOTE    Rebecca [name]  FMW:[id number] DOB: 01-08-1966 DOA: 06/18/2024 PCP: Celestia Rosaline SQUIBB, NP   Brief Narrative:  HPI: Rebecca [name] is a 58 y.o. Spanish-speaking female with medical history significant of hypertension, hyperlipidemia, CAD with remote history of LAD dissection, diabetes mellitus type 2, hyperthyroidism, and CKD stage III, s/p prior right great toe amputation presents with a blackened left second toe following redness and pain.   Approximately ten days ago, she noticed redness on her toe after trying on shoes. Initially, there was no pain, but two to three days later, she began experiencing pain in the affected area.   She visited Dr. Magdalen of podiatrist on Friday, who prescribed antibiotics due to concerns of infection. However, she only took two doses of doxycycline  as they caused vomiting. Since then, she has experienced chills and noted that the toe, initially red, has turned black.   She has a history of diabetes, which is not well-controlled as she does not regularly check her blood sugar levels. She previously had a great toe amputated a year before the pandemic and had two more toes removed during the pandemic due to infection.   She is on medication for blood pressure but did not take her dose today. She has not had any Doppler ultrasounds or vascular studies done on her legs previously.   She had been seen by podiatry started on doxycycline  on the 9/26.   In the ED patient was noted to be afebrile with heart rates elevated up to 117, blood pressures 160/73, and all other vital signs maintained.  Labs noted CBC with differential within normal limits, lactic acid 1.6, BUN 15, creatinine 1.35, glucose 289, and alkaline phosphatase 130.  X-rays of the left foot noted soft tissue swelling of the second toe with slight increased sclerosis of the midshaft of the second proximal phalanx thought to reflect sequela of chronic osteomyelitis with no  signs of bony erosion or obstruction to indicate acute osteomyelitis.  Blood cultures were obtained.  Patient had been given 500 mL bolus of IV fluids, Zofran , and Zosyn .  Assessment & Plan:   Principal Problem:   Cellulitis in diabetic foot (HCC) Active Problems:   Osteomyelitis of second toe of left foot (HCC)   Uncontrolled type 2 diabetes mellitus with hyperglycemia, with long-term current use of insulin  (HCC)   Essential hypertension   CAD- with distal LAD disease, ? 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   History of osteomyelitis   Hyperthyroidism   Chronic kidney disease, stage III (moderate) (HCC)  Cellulitis and ulceration of the left second toe in a diabetic foot, POA:  Seen by podiatry on 9/26 and started on doxycycline  but unable to tolerate the medication with reports of nausea and vomiting only taking 2 doses.  X-rays revealed signs of chronic osteomyelitis with no signs to suggest acute osteomyelitis.  Prior ABI with TBI's were noted to be within normal limits back in 2018.  Blood cultures obtained and patient started on empiric antibiotics.  Seen by podiatry, MRI completed which ruled out osteomyelitis.  Awaiting further plans by podiatry.   Uncontrolled diabetes mellitus type 2, with hyperglycemia and peripheral neuropathy Patient reports poor adherence to diabetic regimen.  On admission glucose noted to be elevated up to 289.  PTA regimen includes Trulicity , Lantus  65 units daily, lispro 10 units twice daily and gabapentin .  Currently on Lantus  50 units as well as SSI and gabapentin .  Blood sugar fairly controlled.   Essential hypertension On admission blood pressures  were elevated up to 160/73 - Blood pressure within normal range, continue home dose of amlodipine  but hold losartan  due to AKI as below. - Hydralazine  IV as needed   CAD Hyperlipidemia Patient with prior history of NSTEMI with concern for spontaneous LAD dissection back in 2016. Continue atorvastatin  and resumed aspirin .    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 Harden   Hyperthyroidism Free T4 noted to be 1.16 back in 02/03/2024 - Continue methimazole    AKI on chronic kidney disease stage IIIa Creatinine noted to be 1.35 which appears slightly higher than previous however creatinine has jumped to 1.64, meeting criteria for AKI and CKD stage IIIa.  Will avoid nephrotoxic agents, hold losartan , start on normal saline 100 cc/h for next 1 day.   DVT prophylaxis: enoxaparin  (LOVENOX ) injection 40 mg Start: 06/18/24 2000   Code Status: Full Code  Family Communication:  None present at bedside.  Plan of care discussed with patient in length and he/she verbalized understanding and agreed with it.  Status is: Observation The patient will require care spanning > 2 midnights and should be moved to inpatient because: Awaiting final plans by podiatry   Estimated body mass index is 31.09 kg/m as calculated from the following:   Height as of this encounter: 5' 2 (1.575 m).   Weight as of this encounter: 77.1 kg.    Nutritional Assessment: Body mass index is 31.09 kg/m.SABRA Seen by dietician.  I agree with the assessment and plan as outlined below: Nutrition Status:        . Skin Assessment: I have examined the patient's skin and I agree with the wound assessment as performed by the wound care RN as outlined below:    Consultants:  Podiatry  Procedures:  None  Antimicrobials:  Anti-infectives (From admission, onward)    Start     Dose/Rate Route Frequency Ordered Stop   06/18/24 2200  piperacillin -tazobactam (ZOSYN ) IVPB 3.375 g  Status:  Discontinued        3.375 g 100 mL/hr over 30 Minutes Intravenous Every 8 hours 06/18/24 1253 06/18/24 1317   06/18/24 2200  piperacillin -tazobactam (ZOSYN ) IVPB 3.375 g        3.375 g 12.5 mL/hr over 240 Minutes Intravenous Every 8 hours 06/18/24 1317     06/18/24  1115  piperacillin -tazobactam (ZOSYN ) IVPB 3.375 g        3.375 g 100 mL/hr over 30 Minutes Intravenous  Once 06/18/24 1114 06/18/24 1240         Subjective: Patient seen and examined, in person Spanish interpreter used.  Patient says that she is feeling better.  She denied any pain or any other complaint.  She had no questions for me.  Objective: Vitals:   06/18/24 2117 06/19/24 0500 06/19/24 0742 06/19/24 0812  BP: (!) 158/117 (!) 141/76 135/76 135/76  Pulse: (!) 110 92 95   Resp: 16 16    Temp: 98.7 F (37.1 C) 98 F (36.7 C) 97.6 F (36.4 C)   TempSrc: Oral Oral Oral   SpO2: 100% 100% 97%   Weight:      Height:        Intake/Output Summary (Last 24 hours) at 06/19/2024 0821 Last data filed at 06/19/2024 0600 Gross per 24 hour  Intake 552.29 ml  Output --  Net 552.29 ml   Filed Weights   06/18/24 1007  Weight: 77.1 kg  Examination:  General exam: Appears calm and comfortable  Respiratory system: Clear to auscultation. Respiratory effort normal. Cardiovascular system: S1 & S2 heard, RRR. No JVD, murmurs, rubs, gallops or clicks. No pedal edema. Gastrointestinal system: Abdomen is nondistended, soft and nontender. No organomegaly or masses felt. Normal bowel sounds heard. Central nervous system: Alert and oriented. No focal neurological deficits. Extremities: Dressing in the left foot, direct visualization deferred to podiatry. Psychiatry: Judgement and insight appear normal. Mood & affect appropriate.    Data Reviewed: I have personally reviewed following labs and imaging studies  CBC: Recent Labs  Lab 06/18/24 0929 06/19/24 0204  WBC 7.0 8.6  NEUTROABS 4.6  --   HGB 12.8 12.1  HCT 39.2 36.9  MCV 82.7 82.9  PLT 344 318   Basic Metabolic Panel: Recent Labs  Lab 06/18/24 0929 06/19/24 0204  NA 136 142  K 3.9 3.9  CL 100 107  CO2 22 26  GLUCOSE 289* 71  BUN 15 14  CREATININE 1.35* 1.64*  CALCIUM  9.5 8.9   GFR: Estimated Creatinine  Clearance: 35.9 mL/min (A) (by C-G formula based on SCr of 1.64 mg/dL (H)). Liver Function Tests: Recent Labs  Lab 06/18/24 0929  AST 39  ALT 32  ALKPHOS 130*  BILITOT 1.1  PROT 8.1  ALBUMIN 3.7   No results for input(s): LIPASE, AMYLASE in the last 168 hours. No results for input(s): AMMONIA in the last 168 hours. Coagulation Profile: No results for input(s): INR, PROTIME in the last 168 hours. Cardiac Enzymes: No results for input(s): CKTOTAL, CKMB, CKMBINDEX, TROPONINI in the last 168 hours. BNP (last 3 results) No results for input(s): PROBNP in the last 8760 hours. HbA1C: Recent Labs    06/18/24 0929  HGBA1C 12.3*   CBG: Recent Labs  Lab 06/18/24 1015 06/18/24 1822 06/18/24 2114 06/19/24 0740  GLUCAP 247* 337* 155* 127*   Lipid Profile: No results for input(s): CHOL, HDL, LDLCALC, TRIG, CHOLHDL, LDLDIRECT in the last 72 hours. Thyroid  Function Tests: No results for input(s): TSH, T4TOTAL, FREET4, T3FREE, THYROIDAB in the last 72 hours. Anemia Panel: No results for input(s): VITAMINB12, FOLATE, FERRITIN, TIBC, IRON, RETICCTPCT in the last 72 hours. Sepsis Labs: Recent Labs  Lab 06/18/24 1005  LATICACIDVEN 1.6    No results found for this or any previous visit (from the past 240 hours).   Radiology Studies: MR FOOT LEFT WO CONTRAST Result Date: 06/18/2024 CLINICAL DATA:  possible osteomyelitis left 2nd toe EXAM: MRI OF THE LEFT FOOT WITHOUT CONTRAST TECHNIQUE: Multiplanar, multisequence MR imaging of the left forefoot was performed. No intravenous contrast was administered. COMPARISON:  Radiographs 06/18/2024, 06/14/2024 and 06/27/2019. FINDINGS: Bones/Joint/Cartilage Heterogeneous fat saturation in the toes on the T2 weighted images. Sagittal inversion recovery images were obtained. There is minimal marrow hyperintensity within the distal 2nd phalanx on the inversion recovery images (image 15/8). There is no  associated T1 signal abnormality or cortical destruction, and this finding is questionable. No suspicious marrow abnormality in the proximal 2nd phalanx to correspond with the sclerosis on prior radiographs which appears chronic. Mild degenerative changes at the 1st metatarsophalangeal joint. Advanced degenerative changes at the 1st tarsometatarsal joint, progressive from 2020. Lesser degenerative changes more laterally at the Lisfranc joint. No significant joint effusions. Ligaments The Lisfranc ligament is not well visualized due to the adjacent degenerative changes, although appears grossly intact. The collateral ligaments of the metatarsophalangeal joints appear intact. Muscles and Tendons Mild generalized forefoot muscular atrophy. No evidence of tendon tear or tenosynovitis. There  is mild nodular thickening of the medial cord of the plantar fascia within the forefoot. Soft tissues Mild soft tissue swelling in the 2nd toe. Ill-defined edema tracks along the dorsal aspect of the metatarsophalangeal joint. No obvious soft tissue wound or organized fluid collection. Mild nonspecific subcutaneous edema more proximally in the dorsal and lateral aspect of the forefoot. IMPRESSION: 1. No specific signs of osteomyelitis identified. Minimal marrow hyperintensity within the distal 2nd phalanx on the inversion recovery images without associated T1 signal abnormality or cortical destruction. This finding is questionable and could be reactive. 2. Soft tissue swelling in the 2nd toe and along the dorsal aspect of the metatarsophalangeal joint. No obvious soft tissue wound or organized fluid collection. 3. Advanced degenerative changes at the 1st tarsometatarsal joint, progressive from 2020. 4. 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 Electronically Signed   By: Elsie Perone M.D.   On: 06/18/2024 15:16   DG Foot Complete Left Result Date: 06/18/2024 EXAM: 3 OR MORE VIEW(S) XRAY OF THE LEFT FOOT 06/18/2024 10:33:00 AM COMPARISON:  None available. CLINICAL HISTORY: pain / infection - left 2nd toe. FINDINGS: BONES AND JOINTS: Advanced degenerative changes of the first tarsometatarsal joint. Slight increased sclerosis involving the midshaft of the second proximal phalanx similar to previous exam. No focal bone erosion or bony destruction identified. No acute fractures or subluxations. No joint dislocation. SOFT TISSUES: Soft tissue swelling of the second toe. Vascular calcifications. IMPRESSION: 1. Soft tissue swelling of the second toe. 2. Slight increased sclerosis of the midshaft of the second proximal phalanx, which may reflect sequelae of chronic osteomyelitis. No signs of bony erosion or bony destruction to indicate acute osteomyelitis. 3. No acute fractures or subluxations. Electronically signed by: Waddell Calk MD 06/18/2024 10:59 AM EDT RP Workstation: [id number]    Scheduled Meds:  amLODipine   10 mg Oral Daily   aspirin  EC  81 mg Oral Daily   atorvastatin   40 mg Oral Daily   enoxaparin  (LOVENOX ) injection  40 mg Subcutaneous Q24H   gabapentin   400 mg Oral QID   insulin  aspart  0-15 Units Subcutaneous TID WC   insulin  aspart  0-5 Units Subcutaneous QHS   insulin  glargine  50 Units Subcutaneous Q24H   losartan   100 mg Oral Daily   methimazole   5 mg Oral Q M,W,F   sodium chloride  flush  3 mL Intravenous Q12H   Continuous Infusions:  piperacillin -tazobactam 3.375 g (06/19/24 0548)     LOS: 0 days   Fredia Skeeter, MD [facility]  06/19/2024, 8:21 AM   *Please note that this is a verbal dictation therefore any spelling or grammatical errors are due to the Dragon Medical One system interpretation.  Please page via Amion and do not message via secure chat for urgent patient care matters. Secure chat can be used for non urgent patient care matters.  How to contact the [facility] Attending or Consulting provider 7A - 7P or covering provider during after hours 7P -7A, for this patient?  Check the care team in [facility]  and look for a) attending/consulting [facility] provider listed and b) the [facility] team listed. Page or secure chat 7A-7P. Log into [email or web address] and use [facility] universal password to access. If you do not have the password, please contact the hospital operator. Locate the [facility] provider you are looking for under [facility] and page to a number that you can be directly reached. If you still have difficulty reaching the provider, please page the [facility] (Director on Call) for the Hospitalists listed on  amion for assistance.

## 2024-06-19 NOTE — Progress Notes (Signed)
 ABI has been completed.   Results can be found under chart review under CV PROC. 06/19/2024 3:27 PM Shepard Keltz RVT, RDMS

## 2024-06-19 NOTE — Discharge Summary (Signed)
 Physician Discharge Summary  Rebecca [name] FMW:[id number] DOB: 07-26-1966 DOA: 06/18/2024  PCP: Celestia Rosaline SQUIBB, NP  Admit date: 06/18/2024 Discharge date: 06/19/2024    Admitted From: Home Disposition: Home  Recommendations for Outpatient Follow-up:  Follow up with PCP in 1-2 weeks Please obtain BMP/CBC in one week Follow-up with podiatry in 1 week Please follow up with your PCP on the following pending results: Unresulted Labs (From admission, onward)    None         Home Health: None Equipment/Devices: None  Discharge Condition: Stable CODE STATUS: Full code Diet recommendation:  Diet Order             Diet Carb Modified Fluid consistency: Thin; Room service appropriate? Yes  Diet effective now                 Due to brief hospitalization, I have copied admitting hospitalist HPI as below.  HPI: Rebecca [name] is a 58 y.o. Spanish-speaking female with medical history significant of hypertension, hyperlipidemia, CAD with remote history of LAD dissection, diabetes mellitus type 2, hyperthyroidism, and CKD stage III, s/p prior right great toe amputation presents with a blackened left second toe following redness and pain.   Approximately ten days ago, she noticed redness on her toe after trying on shoes. Initially, there was no pain, but two to three days later, she began experiencing pain in the affected area.   She visited Dr. Magdalen of podiatrist on Friday, who prescribed antibiotics due to concerns of infection. However, she only took two doses of doxycycline  as they caused vomiting. Since then, she has experienced chills and noted that the toe, initially red, has turned black.   She has a history of diabetes, which is not well-controlled as she does not regularly check her blood sugar levels. She previously had a great toe amputated a year before the pandemic and had two more toes removed during the pandemic due to infection.   She is on  medication for blood pressure but did not take her dose today. She has not had any Doppler ultrasounds or vascular studies done on her legs previously.   She had been seen by podiatry started on doxycycline  on the 9/26.   In the ED patient was noted to be afebrile with heart rates elevated up to 117, blood pressures 160/73, and all other vital signs maintained.  Labs noted CBC with differential within normal limits, lactic acid 1.6, BUN 15, creatinine 1.35, glucose 289, and alkaline phosphatase 130.  X-rays of the left foot noted soft tissue swelling of the second toe with slight increased sclerosis of the midshaft of the second proximal phalanx thought to reflect sequela of chronic osteomyelitis with no signs of bony erosion or obstruction to indicate acute osteomyelitis.  Blood cultures were obtained.  Patient had been given 500 mL bolus of IV fluids, Zofran , and Zosyn .  Subjective: Seen and examined earlier using in person Spanish interpreter, she has no complaints.  She was later on seen by podiatry in the afternoon, I received a message from Dr. Lorne that she is cleared for discharge.  They will follow-up in the clinic in 1 week.  Brief/Interim Summary: Details of hospitalization below.  Cellulitis and ulceration of the left second toe in a diabetic foot, POA:  Seen by podiatry on 9/26 and started on doxycycline  but unable to tolerate the medication with reports of nausea and vomiting only taking 2 doses.  X-rays revealed signs of chronic osteomyelitis with no  signs to suggest acute osteomyelitis.  Prior ABI with TBI's were noted to be within normal limits back in 2018.  Blood cultures obtained and patient started on empiric antibiotics.  Seen by podiatry, MRI completed which ruled out osteomyelitis.  Awaiting further plans by podiatry.  Podiatry saw her in the afternoon as mentioned above, they cleared her for discharge with recommendations to discharge her on Augmentin  plus doxycycline  for 7  days and daily mupirocin  ointment and band aid dressing to L 2nd toe until follow up as well as post op shoe ordered WBAT LLE.   Uncontrolled diabetes mellitus type 2, with hyperglycemia and peripheral neuropathy Patient reports poor adherence to diabetic regimen.  On admission glucose noted to be elevated up to 289.  PTA regimen includes Trulicity , Lantus  65 units daily, lispro 10 units twice daily and gabapentin .  Resume home medications.   Essential hypertension On admission blood pressures were elevated up to 160/73 - Blood pressure within normal range, continue home dose of amlodipine  but hold losartan  due to AKI for 2 days.   CAD Hyperlipidemia Patient with prior history of NSTEMI with concern for spontaneous LAD dissection back in 2016. Continue atorvastatin  and resumed aspirin .   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 Harden   Hyperthyroidism Free T4 noted to be 1.16 back in 02/03/2024 - Continue methimazole    AKI on chronic kidney disease stage IIIa Creatinine noted to be 1.35 which appears slightly higher than previous however creatinine has jumped to 1.64, meeting criteria for AKI and CKD stage IIIa.  Started on normal saline this morning but now that she is going home, I have ordered 1 L fluid bolus to be given over 2 hours before she goes home.  Advised to resume irbesartan  after 2 days.  Discharge Diagnoses:  Principal Problem:   Cellulitis in diabetic foot (HCC) Active Problems:   Osteomyelitis of second toe of left foot (HCC)   Uncontrolled type 2 diabetes mellitus with hyperglycemia, with long-term current use of insulin  (HCC)   Essential hypertension   CAD- with distal LAD disease, ? 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   History of osteomyelitis   Hyperthyroidism   Chronic kidney disease, stage III (moderate) (HCC)    Discharge Instructions   Allergies as of 06/19/2024   No Known  Allergies      Medication List     PAUSE taking these medications    losartan  100 MG tablet Wait to take this until: June 21, 2024 Commonly known as: COZAAR  Tome 1 tableta (100 mg en total) por va oral diariamente. (Take 1 tablet (100 mg total) by mouth daily.)       TAKE these medications    amLODipine  10 MG tablet Commonly known as: NORVASC  Tome 1 tableta (10 mg en total) por va oral diariamente. (Take 1 tablet (10 mg total) by mouth daily.)   amoxicillin -clavulanate 875-125 MG tablet Commonly known as: AUGMENTIN  Take 1 tablet by mouth 2 (two) times daily for 7 days.   atorvastatin  40 MG tablet Commonly known as: LIPITOR  Tome 1 tableta (40 mg en total) por va oral diariamente. (Take 1 tablet (40 mg total) by mouth daily.)   Basaglar  KwikPen 100 UNIT/ML Inject 65 Units into the skin daily.   doxycycline  100 MG tablet Commonly known as: VIBRA -TABS Take 1 tablet (100 mg total) by mouth 2 (two) times daily for 7 days.   gabapentin  400 MG capsule Commonly  known as: NEURONTIN  Take 1 capsule (400 mg total) by mouth 4 (four) times daily.   HumaLOG  KwikPen 100 UNIT/ML KwikPen Generic drug: insulin  lispro Inject 10 Units into the skin 2 (two) times daily before lunch and supper.   methimazole  5 MG tablet Commonly known as: TAPAZOLE  Take 1 tablet (5 mg total) by mouth every Monday, Wednesday, and Friday.   TRUEplus Pen Needles 31G X 8 MM Misc Generic drug: Insulin  Pen Needle Inject 1 each into the skin at bedtime.   Trulicity  3 MG/0.5ML Soaj Generic drug: Dulaglutide  Inject 3 mg into the skin once a week.        Follow-up Information     Celestia Rosaline SQUIBB, NP Follow up in 1 week(s).   Specialty: Internal Medicine Contact information: [address] [place] [address] [phone]                No Known Allergies  Consultations: Podiatry   Procedures/Studies: VAS US  ABI WITH/WO TBI Result Date: 06/19/2024  LOWER EXTREMITY  DOPPLER STUDY Patient Name:  Rebecca [name]  Date of Exam:   06/19/2024 Medical Rec #: [id number]                Accession #:    [id number] Date of Birth: 1966/08/19                 Patient Gender: F Patient Age:   21 years Exam Location:  [facility] Procedure:      VAS US  ABI WITH/WO TBI Referring Phys: MAXIMINO SHARPS --------------------------------------------------------------------------------  Indications: Ulceration. High Risk Factors: Hypertension, Diabetes, no history of smoking, prior MI,                    coronary artery disease. Other Factors: CKD.  Comparison Study: Previous exam 07/19/2017 Performing Technologist: Leigh Rom RVT/RDMS  Examination Guidelines: A complete evaluation includes at minimum, Doppler waveform signals and systolic blood pressure reading at the level of bilateral brachial, anterior tibial, and posterior tibial arteries, when vessel segments are accessible. Bilateral testing is considered an integral part of a complete examination. Photoelectric Plethysmograph (PPG) waveforms and toe systolic pressure readings are included as required and additional duplex testing as needed. Limited examinations for reoccurring indications may be performed as noted.  ABI Findings: +---------+------------------+-----+---------+----------+ Right    Rt Pressure (mmHg)IndexWaveform Comment    +---------+------------------+-----+---------+----------+ Brachial 156                    triphasic           +---------+------------------+-----+---------+----------+ PTA      164               1.05 biphasic            +---------+------------------+-----+---------+----------+ DP       173               1.11 biphasic            +---------+------------------+-----+---------+----------+ Great Toe                                amputation +---------+------------------+-----+---------+----------+ +---------+------------------+-----+---------+-------+ Left     Lt Pressure  (mmHg)IndexWaveform Comment +---------+------------------+-----+---------+-------+ Brachial 146                    triphasic        +---------+------------------+-----+---------+-------+ PTA      159  1.02 biphasic         +---------+------------------+-----+---------+-------+ DP       162               1.04 triphasic        +---------+------------------+-----+---------+-------+ Great Toe156               1.00 Normal           +---------+------------------+-----+---------+-------+ +-------+-----------+-----------+------------+------------+ ABI/TBIToday's ABIToday's TBIPrevious ABIPrevious TBI +-------+-----------+-----------+------------+------------+ Right  1.11       amputation 1.21        0.93         +-------+-----------+-----------+------------+------------+ Left   1.04       1.00       1.18        0.99         +-------+-----------+-----------+------------+------------+   Summary: Right: Resting right ankle-brachial index is within normal range.  Left: Resting left ankle-brachial index is within normal range. The left toe-brachial index is normal.  *See table(s) above for measurements and observations.     Preliminary    MR FOOT LEFT WO CONTRAST Result Date: 06/18/2024 CLINICAL DATA:  possible osteomyelitis left 2nd toe EXAM: MRI OF THE LEFT FOOT WITHOUT CONTRAST TECHNIQUE: Multiplanar, multisequence MR imaging of the left forefoot was performed. No intravenous contrast was administered. COMPARISON:  Radiographs 06/18/2024, 06/14/2024 and 06/27/2019. FINDINGS: Bones/Joint/Cartilage Heterogeneous fat saturation in the toes on the T2 weighted images. Sagittal inversion recovery images were obtained. There is minimal marrow hyperintensity within the distal 2nd phalanx on the inversion recovery images (image 15/8). There is no associated T1 signal abnormality or cortical destruction, and this finding is questionable. No suspicious marrow abnormality in  the proximal 2nd phalanx to correspond with the sclerosis on prior radiographs which appears chronic. Mild degenerative changes at the 1st metatarsophalangeal joint. Advanced degenerative changes at the 1st tarsometatarsal joint, progressive from 2020. Lesser degenerative changes more laterally at the Lisfranc joint. No significant joint effusions. Ligaments The Lisfranc ligament is not well visualized due to the adjacent degenerative changes, although appears grossly intact. The collateral ligaments of the metatarsophalangeal joints appear intact. Muscles and Tendons Mild generalized forefoot muscular atrophy. No evidence of tendon tear or tenosynovitis. There is mild nodular thickening of the medial cord of the plantar fascia within the forefoot. Soft tissues Mild soft tissue swelling in the 2nd toe. Ill-defined edema tracks along the dorsal aspect of the metatarsophalangeal joint. No obvious soft tissue wound or organized fluid collection. Mild nonspecific subcutaneous edema more proximally in the dorsal and lateral aspect of the forefoot. IMPRESSION: 1. No specific signs of osteomyelitis identified. Minimal marrow hyperintensity within the distal 2nd phalanx on the inversion recovery images without associated T1 signal abnormality or cortical destruction. This finding is questionable and could be reactive. 2. Soft tissue swelling in the 2nd toe and along the dorsal aspect of the metatarsophalangeal joint. No obvious soft tissue wound or organized fluid collection. 3. Advanced degenerative changes at the 1st tarsometatarsal joint, progressive from 2020. 4. 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 Electronically Signed   By: Elsie Perone M.D.   On: 06/18/2024 15:16   DG Foot Complete Left Result Date: 06/18/2024 EXAM: 3 OR MORE VIEW(S) XRAY OF THE LEFT FOOT 06/18/2024 10:33:00 AM COMPARISON: None available. CLINICAL HISTORY: pain / infection - left 2nd toe. FINDINGS: BONES AND JOINTS: Advanced degenerative changes of  the first tarsometatarsal joint. Slight increased sclerosis involving the midshaft of the second proximal phalanx similar to previous exam. No focal bone erosion or  bony destruction identified. No acute fractures or subluxations. No joint dislocation. SOFT TISSUES: Soft tissue swelling of the second toe. Vascular calcifications. IMPRESSION: 1. Soft tissue swelling of the second toe. 2. Slight increased sclerosis of the midshaft of the second proximal phalanx, which may reflect sequelae of chronic osteomyelitis. No signs of bony erosion or bony destruction to indicate acute osteomyelitis. 3. No acute fractures or subluxations. Electronically signed by: Waddell Calk MD 06/18/2024 10:59 AM EDT RP Workstation: [id number]   DG Foot 2 Views Left Result Date: 06/14/2024 Please see detailed radiograph report in office note.    Discharge Exam: Vitals:   06/19/24 1141 06/19/24 1557  BP: 119/71 131/74  Pulse: 97 98  Resp:    Temp: 98 F (36.7 C) 98 F (36.7 C)  SpO2: 100% 98%   Vitals:   06/19/24 0742 06/19/24 0812 06/19/24 1141 06/19/24 1557  BP: 135/76 135/76 119/71 131/74  Pulse: 95  97 98  Resp:      Temp: 97.6 F (36.4 C)  98 F (36.7 C) 98 F (36.7 C)  TempSrc: Oral     SpO2: 97%  100% 98%  Weight:      Height:        General: Pt is alert, awake, not in acute distress Cardiovascular: RRR, S1/S2 +, no rubs, no gallops Respiratory: CTA bilaterally, no wheezing, no rhonchi Abdominal: Soft, NT, ND, bowel sounds + Extremities: no edema, no cyanosis    The results of significant diagnostics from this hospitalization (including imaging, microbiology, ancillary and laboratory) are listed below for reference.     Microbiology: Recent Results (from the past 240 hours)  Culture, blood (routine x 2)     Status: None (Preliminary result)   Collection Time: 06/18/24  9:30 AM   Specimen: BLOOD  Result Value Ref Range Status   Specimen Description BLOOD LEFT ANTECUBITAL  Final    Special Requests   Final    BOTTLES DRAWN AEROBIC AND ANAEROBIC Blood Culture results may not be optimal due to an inadequate volume of blood received in culture bottles   Culture   Final    NO GROWTH < 24 HOURS Performed at [facility], [address] [place], [place] [address]    Report Status PENDING  Incomplete  Culture, blood (routine x 2)     Status: None (Preliminary result)   Collection Time: 06/18/24  9:35 AM   Specimen: BLOOD  Result Value Ref Range Status   Specimen Description BLOOD RIGHT ANTECUBITAL  Final   Special Requests   Final    BOTTLES DRAWN AEROBIC AND ANAEROBIC Blood Culture results may not be optimal due to an inadequate volume of blood received in culture bottles   Culture   Final    NO GROWTH < 24 HOURS Performed at [facility], [address], [place], [place] [address]    Report Status PENDING  Incomplete     Labs: BNP (last 3 results) Recent Labs    07/20/23 0853  BNP 11.2   Basic Metabolic Panel: Recent Labs  Lab 06/18/24 0929 06/19/24 0204  NA 136 142  K 3.9 3.9  CL 100 107  CO2 22 26  GLUCOSE 289* 71  BUN 15 14  CREATININE 1.35* 1.64*  CALCIUM  9.5 8.9   Liver Function Tests: Recent Labs  Lab 06/18/24 0929  AST 39  ALT 32  ALKPHOS 130*  BILITOT 1.1  PROT 8.1  ALBUMIN 3.7   No results for input(s): LIPASE, AMYLASE  in the last 168 hours. No results for input(s): AMMONIA in the last 168 hours. CBC: Recent Labs  Lab 06/18/24 0929 06/19/24 0204  WBC 7.0 8.6  NEUTROABS 4.6  --   HGB 12.8 12.1  HCT 39.2 36.9  MCV 82.7 82.9  PLT 344 318   Cardiac Enzymes: No results for input(s): CKTOTAL, CKMB, CKMBINDEX, TROPONINI in the last 168 hours. BNP: Invalid input(s): POCBNP CBG: Recent Labs  Lab 06/18/24 1822 06/18/24 2114 06/19/24 0740 06/19/24 1117 06/19/24 1556  GLUCAP 337* 155* 127* 107* 116*   D-Dimer No results for input(s): DDIMER in the last 72 hours. Hgb A1c Recent Labs     06/18/24 0929  HGBA1C 12.3*   Lipid Profile No results for input(s): CHOL, HDL, LDLCALC, TRIG, CHOLHDL, LDLDIRECT in the last 72 hours. Thyroid  function studies No results for input(s): TSH, T4TOTAL, T3FREE, THYROIDAB in the last 72 hours.  Invalid input(s): FREET3 Anemia work up No results for input(s): VITAMINB12, FOLATE, FERRITIN, TIBC, IRON, RETICCTPCT in the last 72 hours. Urinalysis    Component Value Date/Time   COLORURINE YELLOW 06/18/2024 0930   APPEARANCEUR CLEAR 06/18/2024 0930   LABSPEC 1.025 06/18/2024 0930   PHURINE 5.0 06/18/2024 0930   GLUCOSEU >=500 (A) 06/18/2024 0930   HGBUR NEGATIVE 06/18/2024 0930   BILIRUBINUR NEGATIVE 06/18/2024 0930   BILIRUBINUR negative 04/29/2023 0950   KETONESUR 5 (A) 06/18/2024 0930   PROTEINUR >=300 (A) 06/18/2024 0930   UROBILINOGEN 0.2 04/29/2023 0950   UROBILINOGEN 0.2 05/13/2015 0411   NITRITE NEGATIVE 06/18/2024 0930   LEUKOCYTESUR NEGATIVE 06/18/2024 0930   Sepsis Labs Recent Labs  Lab 06/18/24 0929 06/19/24 0204  WBC 7.0 8.6   Microbiology Recent Results (from the past 240 hours)  Culture, blood (routine x 2)     Status: None (Preliminary result)   Collection Time: 06/18/24  9:30 AM   Specimen: BLOOD  Result Value Ref Range Status   Specimen Description BLOOD LEFT ANTECUBITAL  Final   Special Requests   Final    BOTTLES DRAWN AEROBIC AND ANAEROBIC Blood Culture results may not be optimal due to an inadequate volume of blood received in culture bottles   Culture   Final    NO GROWTH < 24 HOURS Performed at [facility], [address], [place], [place] [address]    Report Status PENDING  Incomplete  Culture, blood (routine x 2)     Status: None (Preliminary result)   Collection Time: 06/18/24  9:35 AM   Specimen: BLOOD  Result Value Ref Range Status   Specimen Description BLOOD RIGHT ANTECUBITAL  Final   Special Requests   Final    BOTTLES DRAWN AEROBIC AND ANAEROBIC  Blood Culture results may not be optimal due to an inadequate volume of blood received in culture bottles   Culture   Final    NO GROWTH < 24 HOURS Performed at [facility], [address], [place], [place] [address]    Report Status PENDING  Incomplete    FURTHER DISCHARGE INSTRUCTIONS:   Get Medicines reviewed and adjusted: Please take all your medications with you for your next visit with your Primary MD   Laboratory/radiological data: Please request your Primary MD to go over all hospital tests and procedure/radiological results at the follow up, please ask your Primary MD to get all Hospital records sent to his/her office.   In some cases, they will be blood work, cultures and biopsy results pending at the time of your discharge.  Please request that your primary care M.D. goes through all the records of your hospital data and follows up on these results.   Also Note the following: If you experience worsening of your admission symptoms, develop shortness of breath, life threatening emergency, suicidal or homicidal thoughts you must seek medical attention immediately by calling 911 or calling your MD immediately  if symptoms less severe.   You must read complete instructions/literature along with all the possible adverse reactions/side effects for all the Medicines you take and that have been prescribed to you. Take any new Medicines after you have completely understood and accpet all the possible adverse reactions/side effects.    patient was instructed, not to drive, operate heavy machinery, perform activities at heights, swimming or participation in water activities or provide baby-sitting services while on Pain, Sleep and Anxiety Medications; until their outpatient Physician has advised to do so again. Also recommended to not to take more than prescribed Pain, Sleep and Anxiety Medications.  It is not advisable to combine anxiety, sleep and pain medications without talking with  your primary care provider.     Wear Seat belts while driving.   Please note: You were cared for by a hospitalist during your hospital stay. Once you are discharged, your primary care physician will handle any further medical issues. Please note that NO REFILLS for any discharge medications will be authorized once you are discharged, as it is imperative that you return to your primary care physician (or establish a relationship with a primary care physician if you do not have one) for your post hospital discharge needs so that they can reassess your need for medications and monitor your lab values  Time coordinating discharge: Over 30 minutes  SIGNED:   Fredia Skeeter, MD  [facility] 06/19/2024, 4:00 PM *Please note that this is a verbal dictation therefore any spelling or grammatical errors are due to the Dragon Medical One system interpretation. If 7PM-7AM, please contact night-coverage [email or web address]

## 2024-06-19 NOTE — Progress Notes (Signed)
 Orthopedic Tech Progress Note Patient Details:  Rebecca [name] 1965-11-14 [id number]  Ortho Devices Type of Ortho Device: Postop shoe/boot Ortho Device/Splint Location: LLE Ortho Device/Splint Interventions: Ordered, Application, Adjustment, Removal   Post Interventions Patient Tolerated: Well Instructions Provided: Care of device  Rebecca [name] Pac 06/19/2024, 6:35 PM

## 2024-06-19 NOTE — Progress Notes (Signed)
 Discharge Summary: DC order noted per MD. DC RN at bedside. Med details completed. AVS pending print, unable to due running zosyn /bolus. TOC meds delivered to bedside. Post-op shoe pending per ortho tech, patient/primary RN/charge informed.    Rosario EMERSON Lund, RN

## 2024-06-19 NOTE — Inpatient Diabetes Management (Addendum)
 Inpatient Diabetes Program Recommendations  AACE/ADA: New Consensus Statement on Inpatient Glycemic Control (2015)  Target Ranges:  Prepandial:   less than 140 mg/dL      Peak postprandial:   less than 180 mg/dL (1-2 hours)      Critically ill patients:  140 - 180 mg/dL   Lab Results  Component Value Date   GLUCAP 107 (H) 06/19/2024   HGBA1C 12.3 (H) 06/18/2024    Review of Glycemic Control  Latest Reference Range & Units 06/18/24 21:14 06/19/24 07:40 06/19/24 11:17  Glucose-Capillary 70 - 99 mg/dL 844 (H) 872 (H) 892 (H)   Diabetes history: DM 2 Outpatient Diabetes medications:  Trulicity  3 mg once weekly, Basaglar  65 units daily, Humalog  10 units bid Current orders for Inpatient glycemic control:  Novolog  0-15 units tid with meals and HS Lantus  50 units daily Inpatient Diabetes Program Recommendations:    Note elevated A1C.  Unclear if patient is taking insulin  as ordered.  Spoke with patient using interpreter on I-pad.  Initially patient states that she is taking insulin  as ordered.  Later she states that she is not caring for herself as she should.  We discussed importance of taking insulin  as ordered and checking her blood sugars. We briefly discussed CGM, however patient does not have insurance to cover medications or supplies.   She states she does have a meter at home and strips- but she's frustrated with sticking her fingers.  A1C is worse than May of 2025.  Note that blood sugars are pretty good here in the hospital with only 50 units of basal insulin .  Needs close follow-up at the [facility].  Reminded patient of the importance of taking medications as ordered. Ordered LWWD booklet and Nutrition consult due elevated A1C.    Thanks,  Randall Bullocks, RN, BC-ADM Inpatient Diabetes Coordinator Pager [phone]  (8a-5p)

## 2024-06-20 ENCOUNTER — Telehealth: Payer: Self-pay

## 2024-06-20 NOTE — Transitions of Care (Post Inpatient/ED Visit) (Signed)
   06/20/2024  Name: Rebecca [name] MRN: [id number] DOB: 03/06/1966  Today's TOC FU Call Status: Today's TOC FU Call Status:: Unsuccessful Call (1st Attempt) Unsuccessful Call (1st Attempt) Date: 06/20/24  Attempted to reach the patient regarding the most recent Inpatient/ED visit.  Calls placed with assistance of Spanish interpreter: [id number]/[employer].  We called : [phone] and the recording stated that it is an invalid number.  We also called [phone] ad the phone rang but there was no option to leave a voicemail message.   Follow Up Plan: Additional outreach attempts will be made to reach the patient to complete the Transitions of Care (Post Inpatient/ED visit) call.     Signature  Slater Diesel, RN

## 2024-06-21 ENCOUNTER — Telehealth: Payer: Self-pay

## 2024-06-21 NOTE — Transitions of Care (Post Inpatient/ED Visit) (Signed)
 06/21/2024  Name: Rebecca [name] MRN: [id number] DOB: 11-21-1965  Today's TOC FU Call Status: Today's TOC FU Call Status:: Successful TOC FU Call Completed Unsuccessful Call (1st Attempt) Date: 06/20/24 [facility] FU Call Complete Date: 06/21/24 Patient's Name and Date of Birth confirmed.  Transition Care Management Follow-up Telephone Call Date of Discharge: 06/19/24 Discharge Facility: [facility]) Type of Discharge: Inpatient Admission Primary Inpatient Discharge Diagnosis:: cellulitis in diabetic foot How have you been since you were released from the hospital?: Better Any questions or concerns?: Yes Patient Questions/Concerns:: She is uninsured and I asked her if she has applied for Medicaid and she stated that she is not eligible. She is interested in applying for CAFA/OC and I told her that I will refer her to our Financial Counselor and she was agreeable. Patient Questions/Concerns Addressed: Other: (message sent to Josselin Speas, Financial Counselor requesting she reach out the patient.)  Items Reviewed: Did you receive and understand the discharge instructions provided?: Yes Medications obtained,verified, and reconciled?: Partial Review Completed Reason for Partial Mediation Review: She said she did not have the list with her.  She correctly stated her insulin  doses and when she gives each one. I offered to read off the med list but she again said she did not have the list with her.  She said her son understands [place] and Spanish and has helped her with the medication instructions. I told her to please call this clinic back if she has any questions about the meds. She also confirmed she has a glucometer and she has been checking her blood sugars on her own Any new allergies since your discharge?: No Dietary orders reviewed?: Yes Type of Diet Ordered:: heart healthy, low sodium, diabetic- she stated she is trying to eat better Do you have support at home?: Yes People in  Home [RPT]: child(ren), adult Name of Support/Comfort Primary Source: she lives with her son and daughter in law  Medications Reviewed Today: Medications Reviewed Today   Medications were not reviewed in this encounter     Home Care and Equipment/Supplies: Were Home Health Services Ordered?: No Any new equipment or medical supplies ordered?: Yes Name of Medical supply agency?: She was given dressing supplies for her foot when she left the hospital and she said she has enough supplies at this time Were you able to get the equipment/medical supplies?: Yes Do you have any questions related to the use of the equipment/supplies?: No  Functional Questionnaire: Do you need assistance with bathing/showering or dressing?: No (She stated she is doing the dressing change to her left foot daily by herself.) Do you need assistance with meal preparation?: No Do you need assistance with eating?: No Do you have difficulty maintaining continence: No Do you need assistance with getting out of bed/getting out of a chair/moving?: No (She has a post- op shoe for her left foot and is WBAT LLE.  She stated she does not need an assistive device.) Do you have difficulty managing or taking your medications?: Yes  Follow up appointments reviewed: PCP Follow-up appointment confirmed?: Yes Date of PCP follow-up appointment?: 07/21/24 Follow-up Provider: Rosaline Loving, NP.  I offered to schedule her with another provider so she could be seen sooner but she declined and said she will wait to see Rosaline. Specialist Hospital Follow-up appointment confirmed?: Yes Date of Specialist follow-up appointment?: 07/04/24 Follow-Up Specialty Provider:: podiatry Do you need transportation to your follow-up appointment?: No Do you understand care options if your condition(s) worsen?: Yes-patient verbalized  understanding    SIGNATURE Slater Diesel, RN

## 2024-06-23 ENCOUNTER — Other Ambulatory Visit: Payer: Self-pay

## 2024-06-23 LAB — CULTURE, BLOOD (ROUTINE X 2)
Culture: NO GROWTH
Culture: NO GROWTH

## 2024-06-24 ENCOUNTER — Other Ambulatory Visit: Payer: Self-pay

## 2024-06-24 ENCOUNTER — Emergency Department (HOSPITAL_COMMUNITY)

## 2024-06-24 ENCOUNTER — Inpatient Hospital Stay (HOSPITAL_COMMUNITY)
Admission: EM | Admit: 2024-06-24 | Discharge: 2024-06-25 | DRG: 392 | Disposition: A | Discharge status: Home / Self Care | Attending: Internal Medicine | Admitting: Internal Medicine

## 2024-06-24 DIAGNOSIS — E1165 Type 2 diabetes mellitus with hyperglycemia: Secondary | ICD-10-CM | POA: Diagnosis present

## 2024-06-24 DIAGNOSIS — Z79899 Other long term (current) drug therapy: Secondary | ICD-10-CM

## 2024-06-24 DIAGNOSIS — Z7985 Long-term (current) use of injectable non-insulin antidiabetic drugs: Secondary | ICD-10-CM

## 2024-06-24 DIAGNOSIS — I2510 Atherosclerotic heart disease of native coronary artery without angina pectoris: Secondary | ICD-10-CM | POA: Diagnosis present

## 2024-06-24 DIAGNOSIS — Z83438 Family history of other disorder of lipoprotein metabolism and other lipidemia: Secondary | ICD-10-CM

## 2024-06-24 DIAGNOSIS — Z794 Long term (current) use of insulin: Secondary | ICD-10-CM

## 2024-06-24 DIAGNOSIS — N1832 Chronic kidney disease, stage 3b: Secondary | ICD-10-CM | POA: Diagnosis present

## 2024-06-24 DIAGNOSIS — I129 Hypertensive chronic kidney disease with stage 1 through stage 4 chronic kidney disease, or unspecified chronic kidney disease: Secondary | ICD-10-CM | POA: Diagnosis present

## 2024-06-24 DIAGNOSIS — I252 Old myocardial infarction: Secondary | ICD-10-CM

## 2024-06-24 DIAGNOSIS — Z8249 Family history of ischemic heart disease and other diseases of the circulatory system: Secondary | ICD-10-CM

## 2024-06-24 DIAGNOSIS — E039 Hypothyroidism, unspecified: Secondary | ICD-10-CM | POA: Diagnosis present

## 2024-06-24 DIAGNOSIS — M869 Osteomyelitis, unspecified: Secondary | ICD-10-CM | POA: Diagnosis present

## 2024-06-24 DIAGNOSIS — E1122 Type 2 diabetes mellitus with diabetic chronic kidney disease: Secondary | ICD-10-CM | POA: Diagnosis present

## 2024-06-24 DIAGNOSIS — E7800 Pure hypercholesterolemia, unspecified: Secondary | ICD-10-CM | POA: Diagnosis present

## 2024-06-24 DIAGNOSIS — E875 Hyperkalemia: Secondary | ICD-10-CM | POA: Diagnosis present

## 2024-06-24 DIAGNOSIS — Z8739 Personal history of other diseases of the musculoskeletal system and connective tissue: Secondary | ICD-10-CM

## 2024-06-24 DIAGNOSIS — I10 Essential (primary) hypertension: Secondary | ICD-10-CM | POA: Diagnosis present

## 2024-06-24 DIAGNOSIS — Z833 Family history of diabetes mellitus: Secondary | ICD-10-CM

## 2024-06-24 DIAGNOSIS — K529 Noninfective gastroenteritis and colitis, unspecified: Principal | ICD-10-CM | POA: Diagnosis present

## 2024-06-24 DIAGNOSIS — N179 Acute kidney failure, unspecified: Secondary | ICD-10-CM | POA: Diagnosis present

## 2024-06-24 DIAGNOSIS — E0590 Thyrotoxicosis, unspecified without thyrotoxic crisis or storm: Secondary | ICD-10-CM | POA: Diagnosis present

## 2024-06-24 DIAGNOSIS — A09 Infectious gastroenteritis and colitis, unspecified: Principal | ICD-10-CM | POA: Diagnosis present

## 2024-06-24 DIAGNOSIS — Z89411 Acquired absence of right great toe: Secondary | ICD-10-CM

## 2024-06-24 LAB — COMPREHENSIVE METABOLIC PANEL WITH GFR
ALT: 17 U/L (ref 0–44)
AST: 20 U/L (ref 15–41)
Albumin: 3 g/dL — ABNORMAL LOW (ref 3.5–5.0)
Alkaline Phosphatase: 106 U/L (ref 38–126)
Anion gap: 12 (ref 5–15)
BUN: 23 mg/dL — ABNORMAL HIGH (ref 6–20)
CO2: 19 mmol/L — ABNORMAL LOW (ref 22–32)
Calcium: 8.8 mg/dL — ABNORMAL LOW (ref 8.9–10.3)
Chloride: 106 mmol/L (ref 98–111)
Creatinine, Ser: 1.77 mg/dL — ABNORMAL HIGH (ref 0.44–1.00)
GFR, Estimated: 33 mL/min — ABNORMAL LOW (ref >60)
Glucose, Bld: 284 mg/dL — ABNORMAL HIGH (ref 70–99)
Potassium: 5.5 mmol/L — ABNORMAL HIGH (ref 3.5–5.1)
Sodium: 137 mmol/L (ref 135–145)
Total Bilirubin: 0.6 mg/dL (ref 0.0–1.2)
Total Protein: 6.3 g/dL — ABNORMAL LOW (ref 6.5–8.1)

## 2024-06-24 LAB — CBC
HCT: 33.1 % — ABNORMAL LOW (ref 36.0–46.0)
Hemoglobin: 10.6 g/dL — ABNORMAL LOW (ref 12.0–15.0)
MCH: 27.5 pg (ref 26.0–34.0)
MCHC: 32 g/dL (ref 30.0–36.0)
MCV: 85.8 fL (ref 80.0–100.0)
Platelets: 292 K/uL (ref 150–400)
RBC: 3.86 MIL/uL — ABNORMAL LOW (ref 3.87–5.11)
RDW: 12.8 % (ref 11.5–15.5)
WBC: 9.2 K/uL (ref 4.0–10.5)
nRBC: 0 % (ref 0.0–0.2)

## 2024-06-24 LAB — GLUCOSE, CAPILLARY: Glucose-Capillary: 96 mg/dL (ref 70–99)

## 2024-06-24 LAB — LIPASE, BLOOD: Lipase: 31 U/L (ref 11–51)

## 2024-06-24 MED ORDER — ONDANSETRON HCL 4 MG/2ML IJ SOLN: 4.0000 mg | Freq: Four times a day (QID) | INTRAMUSCULAR | Status: DC | PRN

## 2024-06-24 MED ORDER — SODIUM CHLORIDE 0.9 % IV SOLN
2.0000 g | INTRAVENOUS | Status: DC
  Administered 2024-06-25: 2 g via INTRAVENOUS
  Filled 2024-06-24: qty 20

## 2024-06-24 MED ORDER — CEFTRIAXONE SODIUM 2 G IJ SOLR: 2.0000 g | Freq: Once | INTRAMUSCULAR | Status: DC

## 2024-06-24 MED ORDER — INSULIN ASPART 100 UNIT/ML IJ SOLN: 0.0000 [IU] | Freq: Three times a day (TID) | INTRAMUSCULAR | Status: DC

## 2024-06-24 MED ORDER — GABAPENTIN 400 MG PO CAPS
400.0000 mg | ORAL_CAPSULE | Freq: Four times a day (QID) | ORAL | Status: DC
  Administered 2024-06-24 – 2024-06-25 (×2): 400 mg via ORAL
  Filled 2024-06-24 (×2): qty 1

## 2024-06-24 MED ORDER — METRONIDAZOLE 500 MG/100ML IV SOLN
500.0000 mg | Freq: Once | INTRAVENOUS | Status: AC
Start: 2024-06-24 — End: 2024-06-25
  Administered 2024-06-24: 500 mg via INTRAVENOUS
  Filled 2024-06-24: qty 100

## 2024-06-24 MED ORDER — AMLODIPINE BESYLATE 10 MG PO TABS
10.0000 mg | ORAL_TABLET | Freq: Every day | ORAL | Status: DC
  Administered 2024-06-25: 10 mg via ORAL
  Filled 2024-06-24: qty 1

## 2024-06-24 MED ORDER — LOSARTAN POTASSIUM 50 MG PO TABS
100.0000 mg | ORAL_TABLET | Freq: Every day | ORAL | Status: DC
  Administered 2024-06-25: 100 mg via ORAL
  Filled 2024-06-24: qty 2

## 2024-06-24 MED ORDER — INSULIN ASPART 100 UNIT/ML IJ SOLN: 0.0000 [IU] | Freq: Every day | INTRAMUSCULAR | Status: DC

## 2024-06-24 MED ORDER — LACTATED RINGERS IV SOLN: INTRAVENOUS | Status: DC

## 2024-06-24 MED ORDER — MORPHINE SULFATE (PF) 2 MG/ML IV SOLN: 2.0000 mg | INTRAVENOUS | Status: DC | PRN

## 2024-06-24 MED ORDER — METRONIDAZOLE 500 MG/100ML IV SOLN
500.0000 mg | Freq: Two times a day (BID) | INTRAVENOUS | Status: DC
  Administered 2024-06-25: 500 mg via INTRAVENOUS
  Filled 2024-06-24: qty 100

## 2024-06-24 MED ORDER — METRONIDAZOLE 500 MG/100ML IV SOLN
500.0000 mg | Freq: Once | INTRAVENOUS | Status: DC
Start: 2024-06-24 — End: 2024-06-24

## 2024-06-24 MED ORDER — ATORVASTATIN CALCIUM 40 MG PO TABS
40.0000 mg | ORAL_TABLET | Freq: Every day | ORAL | Status: DC
  Administered 2024-06-25: 40 mg via ORAL
  Filled 2024-06-24: qty 1

## 2024-06-24 MED ORDER — SODIUM CHLORIDE 0.9 % IV SOLN
2.0000 g | Freq: Once | INTRAVENOUS | Status: AC
  Administered 2024-06-24: 2 g via INTRAVENOUS
  Filled 2024-06-24: qty 12.5

## 2024-06-24 MED ORDER — METHIMAZOLE 5 MG PO TABS: 5.0000 mg | ORAL_TABLET | ORAL | Status: DC

## 2024-06-24 MED ORDER — IOHEXOL 350 MG/ML SOLN
75.0000 mL | Freq: Once | INTRAVENOUS | Status: AC | PRN
  Administered 2024-06-24: 75 mL via INTRAVENOUS

## 2024-06-24 MED ORDER — LACTATED RINGERS IV BOLUS
500.0000 mL | Freq: Once | INTRAVENOUS | Status: AC
  Administered 2024-06-24: 500 mL via INTRAVENOUS

## 2024-06-24 MED ORDER — ONDANSETRON HCL 4 MG PO TABS: 4.0000 mg | ORAL_TABLET | Freq: Four times a day (QID) | ORAL | Status: DC | PRN

## 2024-06-24 NOTE — H&P (Signed)
 History and Physical    Patient: Rebecca [name] FMW:[id number] DOB: [birth year] DOA: 06/24/2024 DOS: the patient was seen and examined on 06/24/2024 PCP: Celestia Rosaline SQUIBB, NP  Patient coming from: Home  Chief Complaint:  Chief Complaint  Patient presents with   Diarrhea   HPI: Rebecca [name] is a 58 y.o. female with medical history significant of insulin -dependent diabetes, coronary artery disease, essential hypertension, hyperlipidemia, recent diagnosis of infected diabetic toe, chronic kidney disease stage III, status post right great toe amputation, history of LAD dissection, hypothyroidism who was just admitted to the hospital on September 28 with osteomyelitis of the foot.  Patient was seen evaluated and discharged.  She was discharged on oral antibiotics but came back today with diarrhea as well as abdominal pain.  Patient also reported bloody stools.  Patient was seen in the ER and evaluated.  CT abdomen pelvis showed evidence of colitis.  She has been admitted for further evaluation and treatment.  Review of Systems: As mentioned in the history of present illness. All other systems reviewed and are negative. Past Medical History:  Diagnosis Date   Anemia    Diabetes mellitus    Dyslipidemia    Hypertension    Non-STEMI (non-ST elevated myocardial infarction) [facility])    Medical therapy for distal LAD dissection   Thyroid  disease    Past Surgical History:  Procedure Laterality Date   AMPUTATION Right 07/21/2017   Procedure: RIGHT GREAT TOE AMPUTATION;  Surgeon: Harden Jerona GAILS, MD;  Location: [facility] OR;  Service: Orthopedics;  Laterality: Right;   AMPUTATION Right 07/05/2020   Procedure: RIGHT FOOT 2ND RAY AMPUTATION;  Surgeon: Harden Jerona GAILS, MD;  Location: [facility] OR;  Service: Orthopedics;  Laterality: Right;   CARDIAC CATHETERIZATION N/A 05/14/2015   Procedure: Left Heart Cath and Coronary Angiography;  Surgeon: Alm LELON Clay, MD; dLAD 60%>>95% (small vessel,  ?dissection), CFX & RCA systems no sig dz, EF & LVEDP nl      Social History:  reports that she has never smoked. She has never used smokeless tobacco. She reports that she does not drink alcohol and does not use drugs.  No Known Allergies  Family History  Problem Relation Age of Onset   Cancer Mother    Hypertension Father    Hyperlipidemia Father    Diabetes Sister    Diabetes Sister    Diabetes Sister    Diabetes Sister    Diabetes Brother    Diabetes Brother    Diabetes Brother    Diabetes Brother    Diabetes Brother    Breast cancer Neg Hx     Prior to Admission medications   Medication Sig Start Date End Date Taking? Authorizing Provider  amLODipine  (NORVASC ) 10 MG tablet Take 1 tablet (10 mg total) by mouth daily. 02/03/24   Celestia Rosaline SQUIBB, NP  amoxicillin -clavulanate (AUGMENTIN ) 875-125 MG tablet Take 1 tablet by mouth 2 (two) times daily for 7 days. 06/19/24 06/26/24  Vernon Ranks, MD  atorvastatin  (LIPITOR ) 40 MG tablet Take 1 tablet (40 mg total) by mouth daily. 02/03/24   Celestia Rosaline SQUIBB, NP  doxycycline  (VIBRA -TABS) 100 MG tablet Take 1 tablet (100 mg total) by mouth 2 (two) times daily for 7 days. 06/19/24 06/26/24  Vernon Ranks, MD  Dulaglutide  (TRULICITY ) 3 MG/0.5ML SOAJ Inject 3 mg into the skin once a week. 01/19/24   Celestia Rosaline SQUIBB, NP  gabapentin  (NEURONTIN ) 400 MG capsule Take 1 capsule (400 mg total) by mouth 4 (four) times daily.  02/03/24   Celestia Rosaline SQUIBB, NP  Insulin  Glargine (BASAGLAR  KWIKPEN) 100 UNIT/ML Inject 65 Units into the skin daily. 06/13/24   Celestia Rosaline SQUIBB, NP  insulin  lispro (HUMALOG  KWIKPEN) 100 UNIT/ML KwikPen Inject 10 Units into the skin 2 (two) times daily before lunch and supper. 02/03/24   Celestia Rosaline SQUIBB, NP  Insulin  Pen Needle 31G X 8 MM MISC Inject 1 each into the skin at bedtime. 02/03/21   Celestia Rosaline SQUIBB, NP  losartan  (COZAAR ) 100 MG tablet Take 1 tablet (100 mg total) by mouth daily. 02/03/24   Celestia Rosaline SQUIBB, NP  methimazole  (TAPAZOLE ) 5 MG tablet Take 1 tablet (5 mg total) by mouth every Monday, Wednesday, and Friday. 02/07/24   Celestia Rosaline SQUIBB, NP    Physical Exam: Vitals:   06/24/24 1930 06/24/24 1938 06/24/24 1945 06/24/24 2000  BP: (!) 143/65 134/65 (!) 141/72 (!) 140/72  Pulse: (!) 111 (!) 107 (!) 110 (!) 110  Resp: 18 16  18   Temp:  99 F (37.2 C)    TempSrc:  Oral    SpO2: 100% 100% 100% 100%   Constitutional: Spanish-speaking, acutely ill looking no distress NAD, calm, comfortable Eyes: PERRL, lids and conjunctivae normal ENMT: Mucous membranes are moist. Posterior pharynx clear of any exudate or lesions.Normal dentition.  Neck: normal, supple, no masses, no thyromegaly Respiratory: clear to auscultation bilaterally, no wheezing, no crackles. Normal respiratory effort. No accessory muscle use.  Cardiovascular: Regular rate and rhythm, no murmurs / rubs / gallops. No extremity edema. 2+ pedal pulses. No carotid bruits.  Abdomen: Mild diffuse tenderness, no masses palpated. No hepatosplenomegaly. Bowel sounds positive.  Musculoskeletal: Good range of motion, no joint swelling or tenderness, Skin: no rashes, lesions, ulcers. No induration Neurologic: CN 2-12 grossly intact. Sensation intact, DTR normal. Strength 5/5 in all 4.  Psychiatric: Normal judgment and insight. Alert and oriented x 3. Normal mood  Data Reviewed:  Temperature 99, blood pressure 165/79, pulse 122 respiratory rate of 18 oxygen sats 99% room air.  White count is 9.2 hemoglobin 10.6.  Potassium 5.5 BUN 23 creatinine 1.77 calcium  8.8 glucose 284.  CT abdomen pelvis shows mild thickening of the mid to distal descending colon and proximal sigmoid colon mild colitis suspected.  Hemoglobin A1c is 12.4.  Assessment and Plan:  #1 acute colitis: Patient with recent hospitalization and extensive antibiotics coverage here with what appears to be mild colitis.  She is having diarrhea partly bloody.  Suspicion  is high for C. difficile colitis.  But could also be other infectious colitis.  Will obtain stool for stool studies including C. difficile.  Initiate empiric Flagyl  and Rocephin  until stool studies are back.  Hydrate and monitor.  #2 uncontrolled insulin -dependent diabetes: Continue Lantus  and sliding scale insulin .  Continue to monitor closely.  #3 essential hypertension: Continue blood pressure monitoring and treatment.  #4 coronary artery disease: Stable.  Continue monitoring.  #5 history of osteomyelitis: Patient on antibiotics.  Will consider adjustment.  #6 hyperthyroidism: Patient is on treatment with methimazole .  Continue treatment.  #7 chronic kidney disease stage III: Renal function is at baseline.  Continue to monitor  #8 hyperkalemia: Mild.  Recheck potassium.  If high still will consider [facility]    Advance Care Planning:   Code Status: Full Code   Consults: None  Family Communication: No family at bedside  Severity of Illness: The appropriate patient status for this patient is INPATIENT. Inpatient status is judged to be reasonable and necessary in order  to provide the required intensity of service to ensure the patient's safety. The patient's presenting symptoms, physical exam findings, and initial radiographic and laboratory data in the context of their chronic comorbidities is felt to place them at high risk for further clinical deterioration. Furthermore, it is not anticipated that the patient will be medically stable for discharge from the hospital within 2 midnights of admission.   * I certify that at the point of admission it is my clinical judgment that the patient will require inpatient hospital care spanning beyond 2 midnights from the point of admission due to high intensity of service, high risk for further deterioration and high frequency of surveillance required.*  AuthorBETHA SIM KNOLL, MD 06/24/2024 8:11 PM  For on call review [email or web address].

## 2024-06-24 NOTE — ED Triage Notes (Signed)
 Patient states she has had diarrhea all night and has had waist pain. Patient states this started when she started taking her antibiotic for her leg infection Denies vomiting but has had some nausea.

## 2024-06-24 NOTE — ED Notes (Signed)
 RN called lab about lipase, waiting for a return phone call.

## 2024-06-24 NOTE — ED Triage Notes (Signed)
 Spanish interpreter used for triage:  Pt c.o diarrhea, chills and fatigue x 2 days.   Pt also c.o right hip pain. Denies recent fall or injury.

## 2024-06-24 NOTE — Progress Notes (Signed)
 MD Sim contacted due to pt having heart rate between 107-117 consistently. MD ok with her not having tele

## 2024-06-24 NOTE — ED Provider Notes (Signed)
 [facility] EMERGENC[facility] DEPARTMENT AT [facility] Provider Note   CSN: [id number] Arrival date & time: 06/24/24  1352     Patient presents with: Diarrhea   Rebecca [name] is a 58 y.o. female.    Diarrhea    Pt has history of dm, htn, high cholesterol Pt states for three days she has been having pain in her abdomen.  When she eats she feels bloated and discomfort.  She has some discomfort in the back.  SHe then feels hot and cold SHe has also been having diarrhea.  She was on the commode last night and noticed blood in the stool and with wiping Prior to Admission medications   Medication Sig Start Date End Date Taking? Authorizing Provider  amLODipine  (NORVASC ) 10 MG tablet Take 1 tablet (10 mg total) by mouth daily. 02/03/24   Celestia Rosaline SQUIBB, NP  amoxicillin -clavulanate (AUGMENTIN ) 875-125 MG tablet Take 1 tablet by mouth 2 (two) times daily for 7 days. 06/19/24 06/26/24  Vernon Ranks, MD  atorvastatin  (LIPITOR ) 40 MG tablet Take 1 tablet (40 mg total) by mouth daily. 02/03/24   Celestia Rosaline SQUIBB, NP  doxycycline  (VIBRA -TABS) 100 MG tablet Take 1 tablet (100 mg total) by mouth 2 (two) times daily for 7 days. 06/19/24 06/26/24  Vernon Ranks, MD  Dulaglutide  (TRULICITY ) 3 MG/0.5ML SOAJ Inject 3 mg into the skin once a week. 01/19/24   Celestia Rosaline SQUIBB, NP  gabapentin  (NEURONTIN ) 400 MG capsule Take 1 capsule (400 mg total) by mouth 4 (four) times daily. 02/03/24   Celestia Rosaline SQUIBB, NP  Insulin  Glargine (BASAGLAR  KWIKPEN) 100 UNIT/ML Inject 65 Units into the skin daily. 06/13/24   Celestia Rosaline SQUIBB, NP  insulin  lispro (HUMALOG  KWIKPEN) 100 UNIT/ML KwikPen Inject 10 Units into the skin 2 (two) times daily before lunch and supper. 02/03/24   Celestia Rosaline SQUIBB, NP  Insulin  Pen Needle 31G X 8 MM MISC Inject 1 each into the skin at bedtime. 02/03/21   Celestia Rosaline SQUIBB, NP  losartan  (COZAAR ) 100 MG tablet Take 1 tablet (100 mg total) by mouth daily. 02/03/24    Celestia Rosaline SQUIBB, NP  methimazole  (TAPAZOLE ) 5 MG tablet Take 1 tablet (5 mg total) by mouth every Monday, Wednesday, and Friday. 02/07/24   Celestia Rosaline SQUIBB, NP    Allergies: Patient has no known allergies.    Review of Systems  Gastrointestinal:  Positive for diarrhea.    Updated Vital Signs BP 134/65 (BP Location: Right Arm)   Pulse (!) 107   Temp 99 F (37.2 C) (Oral)   Resp 16   LMP  (LMP Unknown)   SpO2 100%   Physical Exam  (all labs ordered are listed, but only abnormal results are displayed) Labs Reviewed  COMPREHENSIVE METABOLIC PANEL WITH GFR - Abnormal; Notable for the following components:      Result Value   Potassium 5.5 (*)    CO2 19 (*)    Glucose, Bld 284 (*)    BUN 23 (*)    Creatinine, Ser 1.77 (*)    Calcium  8.8 (*)    Total Protein 6.3 (*)    Albumin 3.0 (*)    GFR, Estimated 33 (*)    All other components within normal limits  CBC - Abnormal; Notable for the following components:   RBC 3.86 (*)    Hemoglobin 10.6 (*)    HCT 33.1 (*)    All other components within normal limits  LIPASE, BLOOD  POC OCCULT BLOOD,  ED    EKG: None  Radiology: CT ABDOMEN PELVIS W CONTRAST Result Date: 06/24/2024 CLINICAL DATA:  Abdominal pain. EXAM: CT ABDOMEN AND PELVIS WITH CONTRAST TECHNIQUE: Multidetector CT imaging of the abdomen and pelvis was performed using the standard protocol following bolus administration of intravenous contrast. RADIATION DOSE REDUCTION: This exam was performed according to the departmental dose-optimization program which includes automated exposure control, adjustment of the mA and/or kV according to patient size and/or use of iterative reconstruction technique. CONTRAST:  75mL OMNIPAQUE  IOHEXOL  350 MG/ML SOLN COMPARISON:  July 20, 2023 FINDINGS: Lower chest: No acute abnormality. Hepatobiliary: There is diffuse fatty infiltration of the liver parenchyma with focal fatty sparing suspected along the falciform ligament. No  gallstones, gallbladder wall thickening, or biliary dilatation. Pancreas: Unremarkable. No pancreatic ductal dilatation or surrounding inflammatory changes. Spleen: Normal in size without focal abnormality. Adrenals/Urinary Tract: Adrenal glands are unremarkable. Kidneys are normal, without renal calculi, focal lesion, or hydronephrosis. Bladder is unremarkable. Stomach/Bowel: Stomach is within normal limits. Appendix appears normal. No evidence of bowel dilatation. There is mild thickening of the mid to distal descending colon and proximal sigmoid colon which are also poorly distended. Vascular/Lymphatic: Mild aortic atherosclerosis. No enlarged abdominal or pelvic lymph nodes. Reproductive: Uterus and bilateral adnexa are unremarkable. Other: No abdominal wall hernia or abnormality. No abdominopelvic ascites. Musculoskeletal: No acute or significant osseous findings. IMPRESSION: 1. Mild thickening of the mid to distal descending colon and proximal sigmoid colon which are also poorly distended. While this may be secondary to underdistention, a mild colitis cannot be excluded. 2. Hepatic steatosis. 3. Aortic atherosclerosis. Electronically Signed   By: Suzen Dials M.D.   On: 06/24/2024 18:10   DG Hip Unilat  With Pelvis 2-3 Views Right Result Date: 06/24/2024 CLINICAL DATA:  Diarrhea, hip pain. EXAM: DG HIP (WITH OR WITHOUT PELVIS) 2-3V RIGHT COMPARISON:  None Available. FINDINGS: There is no evidence of acute hip fracture or dislocation. Mild degenerative changes are noted at the hips bilaterally. There are degenerative changes in the lower lumbar spine. Vascular calcifications are noted in the pelvis and bilateral lower extremities. IMPRESSION: No acute fracture or dislocation. Electronically Signed   By: Leita Birmingham M.D.   On: 06/24/2024 15:13     Procedures   Medications Ordered in the ED  ceFEPIme (MAXIPIME) 2 g in sodium chloride  0.9 % 100 mL IVPB (has no administration in time range)    And   metroNIDAZOLE  (FLAGYL ) IVPB 500 mg (has no administration in time range)  lactated ringers  bolus 500 mL (0 mLs Intravenous Stopped 06/24/24 1903)  iohexol  (OMNIPAQUE ) 350 MG/ML injection 75 mL (75 mLs Intravenous Contrast Given 06/24/24 1730)    Clinical Course as of 06/24/24 1951  Sat Jun 24, 2024  1839 CT scan shows mild thickening of the mid to distal colon and proximal sigmoid colon.  Mild colitis is possible [JK]  1938 No gross blood noted on rectal exam [JK]  1951 Case discussed with Dr. Sim regarding admission [JK]    Clinical Course User Index [JK] Randol Simmonds, MD                                 Medical Decision Making Problems Addressed: Colitis: acute illness or injury that poses a threat to life or bodily functions  Amount and/or Complexity of Data Reviewed Labs: ordered. Decision-making details documented in ED Course. Radiology: ordered and independent interpretation performed.  Risk Prescription drug  management. Decision regarding hospitalization.   Patient presented to the ED with complaints of diarrhea blood in her stool.  Patient was recently in the hospital for a toe infection.  Patient's labs are notable for mild AKI.  Patient also noted to have decreased bicarb mild hyperkalemia. Hemoglobin also shows a drop of almost 2 points this week.  Patient CT scan does show findings of colitis.  No indication for transfusion at this time however with her drop in hemoglobin or colitis I do think it is reasonable to bring her into the hospital for observation..  Will start her on IV antibiotics.  Will consult with the medical service for admission further evaluation.     Final diagnoses:  Colitis    ED Discharge Orders     None          Randol Simmonds, MD 06/24/24 [phone]

## 2024-06-24 NOTE — ED Notes (Signed)
 RN notified of pts arrival via secure messaging d/t no answer when calling floor

## 2024-06-25 ENCOUNTER — Other Ambulatory Visit (HOSPITAL_COMMUNITY): Payer: Self-pay

## 2024-06-25 DIAGNOSIS — Z794 Long term (current) use of insulin: Secondary | ICD-10-CM

## 2024-06-25 DIAGNOSIS — I10 Essential (primary) hypertension: Secondary | ICD-10-CM

## 2024-06-25 DIAGNOSIS — E875 Hyperkalemia: Secondary | ICD-10-CM

## 2024-06-25 DIAGNOSIS — E1165 Type 2 diabetes mellitus with hyperglycemia: Secondary | ICD-10-CM

## 2024-06-25 LAB — COMPREHENSIVE METABOLIC PANEL WITH GFR
ALT: 16 U/L (ref 0–44)
AST: 21 U/L (ref 15–41)
Albumin: 2.8 g/dL — ABNORMAL LOW (ref 3.5–5.0)
Alkaline Phosphatase: 97 U/L (ref 38–126)
Anion gap: 11 (ref 5–15)
BUN: 15 mg/dL (ref 6–20)
CO2: 19 mmol/L — ABNORMAL LOW (ref 22–32)
Calcium: 8.6 mg/dL — ABNORMAL LOW (ref 8.9–10.3)
Chloride: 108 mmol/L (ref 98–111)
Creatinine, Ser: 1.53 mg/dL — ABNORMAL HIGH (ref 0.44–1.00)
GFR, Estimated: 39 mL/min — ABNORMAL LOW (ref >60)
Glucose, Bld: 93 mg/dL (ref 70–99)
Potassium: 3.6 mmol/L (ref 3.5–5.1)
Sodium: 138 mmol/L (ref 135–145)
Total Bilirubin: 0.5 mg/dL (ref 0.0–1.2)
Total Protein: 6 g/dL — ABNORMAL LOW (ref 6.5–8.1)

## 2024-06-25 LAB — CBC
HCT: 31 % — ABNORMAL LOW (ref 36.0–46.0)
Hemoglobin: 9.9 g/dL — ABNORMAL LOW (ref 12.0–15.0)
MCH: 27 pg (ref 26.0–34.0)
MCHC: 31.9 g/dL (ref 30.0–36.0)
MCV: 84.5 fL (ref 80.0–100.0)
Platelets: 299 K/uL (ref 150–400)
RBC: 3.67 MIL/uL — ABNORMAL LOW (ref 3.87–5.11)
RDW: 12.9 % (ref 11.5–15.5)
WBC: 8.1 K/uL (ref 4.0–10.5)
nRBC: 0 % (ref 0.0–0.2)

## 2024-06-25 LAB — GLUCOSE, CAPILLARY
Glucose-Capillary: 102 mg/dL — ABNORMAL HIGH (ref 70–99)
Glucose-Capillary: 54 mg/dL — ABNORMAL LOW (ref 70–99)

## 2024-06-25 MED ORDER — CIPROFLOXACIN HCL 500 MG PO TABS
500.0000 mg | ORAL_TABLET | Freq: Two times a day (BID) | ORAL | 0 refills | Status: AC
  Filled 2024-06-25: qty 14, 7d supply, fill #0

## 2024-06-25 MED ORDER — METRONIDAZOLE 500 MG PO TABS
500.0000 mg | ORAL_TABLET | Freq: Two times a day (BID) | ORAL | 0 refills | Status: AC
  Filled 2024-06-25: qty 14, 7d supply, fill #0

## 2024-06-25 MED ORDER — ONDANSETRON HCL 4 MG PO TABS
4.0000 mg | ORAL_TABLET | Freq: Four times a day (QID) | ORAL | 0 refills | Status: AC | PRN
  Filled 2024-06-25: qty 20, 5d supply, fill #0

## 2024-06-25 NOTE — Discharge Summary (Signed)
 Physician Discharge Summary   Patient: Rebecca [name] MRN: [id number] DOB: 05/12/1966  Admit date:     06/24/2024  Discharge date: 06/25/24  Discharge Physician: Carliss LELON Canales   PCP: Celestia Rosaline SQUIBB, NP   Recommendations at discharge:    Pt to be discharged home.   If you experience worsening fever, chills, chest pain, shortness of breath, or other concerning symptoms, please call your PCP or go to the emergency department immediately.  Discharge Diagnoses: Principal Problem:   Acute colitis Active Problems:   Uncontrolled type 2 diabetes mellitus with hyperglycemia, with long-term current use of insulin  (HCC)   Essential hypertension   CAD- with distal LAD disease, ? 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   History of osteomyelitis   Hyperthyroidism   AKI (acute kidney injury)   Hyperkalemia  Resolved Problems:   * No resolved hospital problems. *   Hospital Course:   KEARA PAGLIARULO is a 58 y.o. female with medical history significant of insulin -dependent diabetes, coronary artery disease, essential hypertension, hyperlipidemia, recent diagnosis of infected diabetic toe, chronic kidney disease stage III, status post right great toe amputation, history of LAD dissection, hypothyroidism who was just admitted to the hospital on September 28 with osteomyelitis of the foot.  Patient was seen evaluated and discharged.  She was discharged on oral antibiotics but came back today with diarrhea as well as abdominal pain.  Patient also reported bloody stools.  Patient was seen in the ER and evaluated.  CT abdomen pelvis showed evidence of colitis.  She has been admitted for further evaluation and treatment.   Assessment and Plan:  Acute colitis of distal descending and sigmoid - Noted mild inflammation on CT scan.  Concern for C. difficile on presentation however no fever, leukocytosis, and no bowel movements throughout hospital stay.  Likely mild infectious colitis.  Responded well to  empiric ceftriaxone  plus Flagyl  IV.  Patient feeling improved, tolerating breakfast, no nausea, vomiting, diarrhea.  Eager for discharge home.  Will transition patient over to p.o. Cipro , Flagyl  to take as directed for the next 7 days.  Recommend soft, bland diet, advance as tolerated at home.  Continue oral hydration.  Insulin -dependent diabetes mellitus - Resume home insulin  regimen upon discharge.  CKD 3B - Creatinine showed improvement after IV fluid hydration.  Mild hyperkalemia - Resolved after IV fluid hydration.  Potassium normal this a.m.  Hypertension/hypothyroidism - Resume home medications    Consultants: None Procedures performed: None Disposition: Home Diet recommendation:  Discharge Diet Orders (From admission, onward)     Start     Ordered   06/25/24 0000  Diet - low sodium heart healthy        06/25/24 1038           Regular diet  DISCHARGE MEDICATION: Allergies as of 06/25/2024   No Known Allergies      Medication List     STOP taking these medications    amoxicillin -clavulanate 875-125 MG tablet Commonly known as: AUGMENTIN    doxycycline  100 MG tablet Commonly known as: VIBRA -TABS       TAKE these medications    amLODipine  10 MG tablet Commonly known as: NORVASC  Tome 1 tableta (10 mg en total) por va oral diariamente. (Take 1 tablet (10 mg total) by mouth daily.)   atorvastatin  40 MG tablet Commonly known as: LIPITOR  Tome 1 tableta (40 mg en total) por va oral diariamente. (Take 1 tablet (40 mg total) by mouth daily.)   Basaglar  KwikPen 100 UNIT/ML Inject 65 Units  into the skin daily.   ciprofloxacin  500 MG tablet Commonly known as: Cipro  Take 1 tablet (500 mg total) by mouth 2 (two) times daily for 7 days.   gabapentin  400 MG capsule Commonly known as: NEURONTIN  Take 1 capsule (400 mg total) by mouth 4 (four) times daily.   HumaLOG  KwikPen 100 UNIT/ML KwikPen Generic drug: insulin  lispro Inject 10 Units into the skin 2  (two) times daily before lunch and supper.   losartan  100 MG tablet Commonly known as: COZAAR  Tome 1 tableta (100 mg en total) por va oral diariamente. (Take 1 tablet (100 mg total) by mouth daily.)   methimazole  5 MG tablet Commonly known as: TAPAZOLE  Take 1 tablet (5 mg total) by mouth every Monday, Wednesday, and Friday.   metroNIDAZOLE  500 MG tablet Commonly known as: FLAGYL  Take 1 tablet (500 mg total) by mouth 2 (two) times daily for 7 days.   ondansetron  4 MG tablet Commonly known as: ZOFRAN  Take 1 tablet (4 mg total) by mouth every 6 (six) hours as needed for nausea.   TRUEplus Pen Needles 31G X 8 MM Misc Generic drug: Insulin  Pen Needle Inject 1 each into the skin at bedtime.   Trulicity  3 MG/0.5ML Soaj Generic drug: Dulaglutide  Inject 3 mg into the skin once a week.         Discharge Exam: There were no vitals filed for this visit.  GENERAL:  Alert, pleasant, no acute distress  HEENT:  EOMI CARDIOVASCULAR:  RRR, no murmurs appreciated RESPIRATORY:  Clear to auscultation, no wheezing, rales, or rhonchi GASTROINTESTINAL:  Soft, minimally tender EXTREMITIES:  No LE edema bilaterally NEURO:  No new focal deficits appreciated SKIN:  No rashes noted PSYCH:  Appropriate mood and affect     Condition at discharge: improving  The results of significant diagnostics from this hospitalization (including imaging, microbiology, ancillary and laboratory) are listed below for reference.   Imaging Studies: CT ABDOMEN PELVIS W CONTRAST Result Date: 06/24/2024 CLINICAL DATA:  Abdominal pain. EXAM: CT ABDOMEN AND PELVIS WITH CONTRAST TECHNIQUE: Multidetector CT imaging of the abdomen and pelvis was performed using the standard protocol following bolus administration of intravenous contrast. RADIATION DOSE REDUCTION: This exam was performed according to the departmental dose-optimization program which includes automated exposure control, adjustment of the mA and/or kV  according to patient size and/or use of iterative reconstruction technique. CONTRAST:  75mL OMNIPAQUE  IOHEXOL  350 MG/ML SOLN COMPARISON:  July 20, 2023 FINDINGS: Lower chest: No acute abnormality. Hepatobiliary: There is diffuse fatty infiltration of the liver parenchyma with focal fatty sparing suspected along the falciform ligament. No gallstones, gallbladder wall thickening, or biliary dilatation. Pancreas: Unremarkable. No pancreatic ductal dilatation or surrounding inflammatory changes. Spleen: Normal in size without focal abnormality. Adrenals/Urinary Tract: Adrenal glands are unremarkable. Kidneys are normal, without renal calculi, focal lesion, or hydronephrosis. Bladder is unremarkable. Stomach/Bowel: Stomach is within normal limits. Appendix appears normal. No evidence of bowel dilatation. There is mild thickening of the mid to distal descending colon and proximal sigmoid colon which are also poorly distended. Vascular/Lymphatic: Mild aortic atherosclerosis. No enlarged abdominal or pelvic lymph nodes. Reproductive: Uterus and bilateral adnexa are unremarkable. Other: No abdominal wall hernia or abnormality. No abdominopelvic ascites. Musculoskeletal: No acute or significant osseous findings. IMPRESSION: 1. Mild thickening of the mid to distal descending colon and proximal sigmoid colon which are also poorly distended. While this may be secondary to underdistention, a mild colitis cannot be excluded. 2. Hepatic steatosis. 3. Aortic atherosclerosis. Electronically Signed   By:  Suzen Dials M.D.   On: 06/24/2024 18:10   DG Hip Unilat  With Pelvis 2-3 Views Right Result Date: 06/24/2024 CLINICAL DATA:  Diarrhea, hip pain. EXAM: DG HIP (WITH OR WITHOUT PELVIS) 2-3V RIGHT COMPARISON:  None Available. FINDINGS: There is no evidence of acute hip fracture or dislocation. Mild degenerative changes are noted at the hips bilaterally. There are degenerative changes in the lower lumbar spine. Vascular  calcifications are noted in the pelvis and bilateral lower extremities. IMPRESSION: No acute fracture or dislocation. Electronically Signed   By: Leita Birmingham M.D.   On: 06/24/2024 15:13   VAS US  ABI WITH/WO TBI Result Date: 06/19/2024  LOWER EXTREMITY DOPPLER STUDY Patient Name:  PANDA CROSSIN  Date of Exam:   06/19/2024 Medical Rec #: [id number]                Accession #:    [id number] Date of Birth: 1966-09-10                 Patient Gender: F Patient Age:   77 years Exam Location:  [facility] Procedure:      VAS US  ABI WITH/WO TBI Referring Phys: MAXIMINO SHARPS --------------------------------------------------------------------------------  Indications: Ulceration. High Risk Factors: Hypertension, Diabetes, no history of smoking, prior MI,                    coronary artery disease. Other Factors: CKD.  Comparison Study: Previous exam 07/19/2017 Performing Technologist: Leigh Rom RVT/RDMS  Examination Guidelines: A complete evaluation includes at minimum, Doppler waveform signals and systolic blood pressure reading at the level of bilateral brachial, anterior tibial, and posterior tibial arteries, when vessel segments are accessible. Bilateral testing is considered an integral part of a complete examination. Photoelectric Plethysmograph (PPG) waveforms and toe systolic pressure readings are included as required and additional duplex testing as needed. Limited examinations for reoccurring indications may be performed as noted.  ABI Findings: +---------+------------------+-----+---------+----------+ Right    Rt Pressure (mmHg)IndexWaveform Comment    +---------+------------------+-----+---------+----------+ Brachial 156                    triphasic           +---------+------------------+-----+---------+----------+ PTA      164               1.05 biphasic            +---------+------------------+-----+---------+----------+ DP       173               1.11 biphasic             +---------+------------------+-----+---------+----------+ Great Toe                                amputation +---------+------------------+-----+---------+----------+ +---------+------------------+-----+---------+-------+ Left     Lt Pressure (mmHg)IndexWaveform Comment +---------+------------------+-----+---------+-------+ Brachial 146                    triphasic        +---------+------------------+-----+---------+-------+ PTA      159               1.02 biphasic         +---------+------------------+-----+---------+-------+ DP       162               1.04 triphasic        +---------+------------------+-----+---------+-------+ Burnetta Ashcombe  1.00 Normal           +---------+------------------+-----+---------+-------+ +-------+-----------+-----------+------------+------------+ ABI/TBIToday's ABIToday's TBIPrevious ABIPrevious TBI +-------+-----------+-----------+------------+------------+ Right  1.11       amputation 1.21        0.93         +-------+-----------+-----------+------------+------------+ Left   1.04       1.00       1.18        0.99         +-------+-----------+-----------+------------+------------+   Summary: Right: Resting right ankle-brachial index is within normal range.  Left: Resting left ankle-brachial index is within normal range. The left toe-brachial index is normal.  *See table(s) above for measurements and observations.  Electronically signed by Debby Robertson on 06/19/2024 at 5:09:54 PM.    Final    MR FOOT LEFT WO CONTRAST Result Date: 06/18/2024 CLINICAL DATA:  possible osteomyelitis left 2nd toe EXAM: MRI OF THE LEFT FOOT WITHOUT CONTRAST TECHNIQUE: Multiplanar, multisequence MR imaging of the left forefoot was performed. No intravenous contrast was administered. COMPARISON:  Radiographs 06/18/2024, 06/14/2024 and 06/27/2019. FINDINGS: Bones/Joint/Cartilage Heterogeneous fat saturation in the toes on the T2  weighted images. Sagittal inversion recovery images were obtained. There is minimal marrow hyperintensity within the distal 2nd phalanx on the inversion recovery images (image 15/8). There is no associated T1 signal abnormality or cortical destruction, and this finding is questionable. No suspicious marrow abnormality in the proximal 2nd phalanx to correspond with the sclerosis on prior radiographs which appears chronic. Mild degenerative changes at the 1st metatarsophalangeal joint. Advanced degenerative changes at the 1st tarsometatarsal joint, progressive from 2020. Lesser degenerative changes more laterally at the Lisfranc joint. No significant joint effusions. Ligaments The Lisfranc ligament is not well visualized due to the adjacent degenerative changes, although appears grossly intact. The collateral ligaments of the metatarsophalangeal joints appear intact. Muscles and Tendons Mild generalized forefoot muscular atrophy. No evidence of tendon tear or tenosynovitis. There is mild nodular thickening of the medial cord of the plantar fascia within the forefoot. Soft tissues Mild soft tissue swelling in the 2nd toe. Ill-defined edema tracks along the dorsal aspect of the metatarsophalangeal joint. No obvious soft tissue wound or organized fluid collection. Mild nonspecific subcutaneous edema more proximally in the dorsal and lateral aspect of the forefoot. IMPRESSION: 1. No specific signs of osteomyelitis identified. Minimal marrow hyperintensity within the distal 2nd phalanx on the inversion recovery images without associated T1 signal abnormality or cortical destruction. This finding is questionable and could be reactive. 2. Soft tissue swelling in the 2nd toe and along the dorsal aspect of the metatarsophalangeal joint. No obvious soft tissue wound or organized fluid collection. 3. Advanced degenerative changes at the 1st tarsometatarsal joint, progressive from 2020. 4. 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  Electronically Signed   By: Elsie Perone M.D.   On: 06/18/2024 15:16   DG Foot Complete Left Result Date: 06/18/2024 EXAM: 3 OR MORE VIEW(S) XRAY OF THE LEFT FOOT 06/18/2024 10:33:00 AM COMPARISON: None available. CLINICAL HISTORY: pain / infection - left 2nd toe. FINDINGS: BONES AND JOINTS: Advanced degenerative changes of the first tarsometatarsal joint. Slight increased sclerosis involving the midshaft of the second proximal phalanx similar to previous exam. No focal bone erosion or bony destruction identified. No acute fractures or subluxations. No joint dislocation. SOFT TISSUES: Soft tissue swelling of the second toe. Vascular calcifications. IMPRESSION: 1. Soft tissue swelling of the second toe. 2. Slight increased sclerosis of the midshaft of the second proximal phalanx, which may reflect sequelae of chronic  osteomyelitis. No signs of bony erosion or bony destruction to indicate acute osteomyelitis. 3. No acute fractures or subluxations. Electronically signed by: Waddell Calk MD 06/18/2024 10:59 AM EDT RP Workstation: [id number]   DG Foot 2 Views Left Result Date: 06/14/2024 Please see detailed radiograph report in office note.   Microbiology: Results for orders placed or performed during the hospital encounter of 06/18/24  Culture, blood (routine x 2)     Status: None   Collection Time: 06/18/24  9:30 AM   Specimen: BLOOD  Result Value Ref Range Status   Specimen Description BLOOD LEFT ANTECUBITAL  Final   Special Requests   Final    BOTTLES DRAWN AEROBIC AND ANAEROBIC Blood Culture results may not be optimal due to an inadequate volume of blood received in culture bottles   Culture   Final    NO GROWTH 5 DAYS Performed at [facility], [address] [place], [place] [address]    Report Status 06/23/2024 FINAL  Final  Culture, blood (routine x 2)     Status: None   Collection Time: 06/18/24  9:35 AM   Specimen: BLOOD  Result Value Ref Range Status   Specimen  Description BLOOD RIGHT ANTECUBITAL  Final   Special Requests   Final    BOTTLES DRAWN AEROBIC AND ANAEROBIC Blood Culture results may not be optimal due to an inadequate volume of blood received in culture bottles   Culture   Final    NO GROWTH 5 DAYS Performed at [facility], [address], [place], [place] [address]    Report Status 06/23/2024 FINAL  Final    Labs: CBC: Recent Labs  Lab 06/19/24 0204 06/24/24 1410 06/25/24 0400  WBC 8.6 9.2 8.1  HGB 12.1 10.6* 9.9*  HCT 36.9 33.1* 31.0*  MCV 82.9 85.8 84.5  PLT 318 292 299   Basic Metabolic Panel: Recent Labs  Lab 06/19/24 0204 06/24/24 1410 06/25/24 0400  NA 142 137 138  K 3.9 5.5* 3.6  CL 107 106 108  CO2 26 19* 19*  GLUCOSE 71 284* 93  BUN 14 23* 15  CREATININE 1.64* 1.77* 1.53*  CALCIUM  8.9 8.8* 8.6*   Liver Function Tests: Recent Labs  Lab 06/24/24 1410 06/25/24 0400  AST 20 21  ALT 17 16  ALKPHOS 106 97  BILITOT 0.6 0.5  PROT 6.3* 6.0*  ALBUMIN 3.0* 2.8*   CBG: Recent Labs  Lab 06/19/24 1117 06/19/24 1556 06/24/24 2149 06/25/24 0843 06/25/24 0903  GLUCAP 107* 116* 96 54* 102*    Discharge time spent: 25 minutes.  Length of inpatient stay: 1 days  Signed: Carliss LELON Canales, DO [facility] 06/25/2024

## 2024-06-25 NOTE — Progress Notes (Signed)
 Translation services used, [place] [phone]

## 2024-06-25 NOTE — Plan of Care (Signed)
  Problem: Fluid Volume: Goal: Ability to maintain a balanced intake and output will improve Outcome: Progressing   Problem: Skin Integrity: Goal: Risk for impaired skin integrity will decrease Outcome: Progressing   Problem: Nutritional: Goal: Maintenance of adequate nutrition will improve Outcome: Progressing   Problem: Safety: Goal: Ability to remain free from injury will improve Outcome: Progressing   Problem: Elimination: Goal: Will not experience complications related to bowel motility Outcome: Progressing

## 2024-06-25 NOTE — Progress Notes (Signed)
 IV removed.  Discharge edu and instructions given to patient and Aloysius the son in law. Patient verbalized understanding. Patient wheeled off the unit for home, passed by pharmacy and picked up prescribed meds.

## 2024-06-26 ENCOUNTER — Telehealth: Payer: Self-pay

## 2024-06-26 ENCOUNTER — Telehealth: Payer: Self-pay | Admitting: *Deleted

## 2024-06-26 NOTE — Transitions of Care (Post Inpatient/ED Visit) (Signed)
 06/26/2024  Name: Rebecca [name] MRN: [id number] DOB: Feb 01, 1966  Today's TOC FU Call Status: Today's TOC FU Call Status:: Successful TOC FU Call Completed TOC FU Call Complete Date: 06/26/24 Call placed with assistance of Spanish Interpreter: [id number]/[employer].  Patien's son in law, [place], who speaks English, joined the call with the patient.  Patient's Name and Date of Birth confirmed.  Transition Care Management Follow-up Telephone Call Date of Discharge: 06/25/24 Discharge Facility: [facility]) Type of Discharge: Inpatient Admission Primary Inpatient Discharge Diagnosis:: colitis How have you been since you were released from the hospital?: Same Any questions or concerns?: Yes Patient Questions/Concerns:: The patient stated she feels  bad.  When asked to describe why she feels bad, she said she has pain on the left side of her stomach when she eats/drinks. She denied and nausea/ vomiting and Jose said she has not been able to eat much due to the pain. Jose confirmed she is taking the antibiotics. We scheduled her for an appointment with Haze Servant, NP on 06/28/2024 and we also discussed when to return to ED. He was appreciative of the appointemnt and confirmed the address for [facility] Patient Questions/Concerns Addressed: Other: (scheduled her with a hospital follow up at [facility] 06/28/2024)  Items Reviewed: Did you receive and understand the discharge instructions provided?: Yes Medications obtained,verified, and reconciled?: Yes (Medications Reviewed) (medication list reviewed with patient's son in law, [place].  She has all medications and they did not have any questions about the med regime,) Any new allergies since your discharge?: No Dietary orders reviewed?: No Do you have support at home?: Yes People in Home [RPT]: other relative(s) Name of Support/Comfort Primary Source: she lives with her son in law, Jose  Medications Reviewed Today: Medications Reviewed  Today     Reviewed by Marvis Bradley, RN (Case Manager) on 06/26/24 at 1227  Med List Status: <None>   Medication Order Taking? Sig Documenting Provider Last Dose Status Informant  amLODipine  (NORVASC ) 10 MG tablet [id number] No Take 1 tablet (10 mg total) by mouth daily. Celestia Rosaline SQUIBB, NP 06/17/2024 Morning Active Self  atorvastatin  (LIPITOR ) 40 MG tablet [id number] No Take 1 tablet (40 mg total) by mouth daily. Celestia Rosaline SQUIBB, NP 06/17/2024 Evening Active Self  ciprofloxacin  (CIPRO ) 500 MG tablet [id number]  Take 1 tablet (500 mg total) by mouth 2 (two) times daily for 7 days. Arlon Carliss ORN, DO  Active   Dulaglutide  (TRULICITY ) 3 MG/0.5ML EMMANUEL [id number] No Inject 3 mg into the skin once a week. Celestia Rosaline SQUIBB, NP 06/14/2024 Active Self  gabapentin  (NEURONTIN ) 400 MG capsule [id number] No Take 1 capsule (400 mg total) by mouth 4 (four) times daily. Celestia Rosaline SQUIBB, NP 06/17/2024 Bedtime Active Self  Insulin  Glargine (BASAGLAR  KWIKPEN) 100 UNIT/ML [id number] No Inject 65 Units into the skin daily. Celestia Rosaline SQUIBB, NP 06/17/2024 Morning Active Self  insulin  lispro (HUMALOG  KWIKPEN) 100 UNIT/ML KwikPen [id number] No Inject 10 Units into the skin 2 (two) times daily before lunch and supper. Celestia Rosaline SQUIBB, NP 06/17/2024 Evening Active Self  Insulin  Pen Needle 31G X 8 MM MISC [id number]  Inject 1 each into the skin at bedtime. Celestia Rosaline SQUIBB, NP  Active Self  losartan  (COZAAR ) 100 MG tablet [id number] No Take 1 tablet (100 mg total) by mouth daily. Celestia Rosaline SQUIBB, NP 06/17/2024 Morning Active Self  methimazole  (TAPAZOLE ) 5 MG tablet [id number] No Take 1 tablet (5 mg total) by mouth every Monday, Wednesday, and Friday. Celestia Rosaline SQUIBB,  NP 06/16/2024 Active Self  metroNIDAZOLE  (FLAGYL ) 500 MG tablet [id number]  Take 1 tablet (500 mg total) by mouth 2 (two) times daily for 7 days. Arlon Carliss ORN, DO  Active   ondansetron  (ZOFRAN ) 4 MG tablet [id number]  Take 1 tablet (4 mg total)  by mouth every 6 (six) hours as needed for nausea. Arlon Carliss ORN, DO  Active             Home Care and Equipment/Supplies: Were Home Health Services Ordered?: No Any new equipment or medical supplies ordered?: No  Functional Questionnaire: Do you need assistance with bathing/showering or dressing?: No (Per Lyman, she is independent with the dressing changes on her left foot.) Do you need assistance with meal preparation?: No Do you need assistance with eating?: No Do you have difficulty maintaining continence: No Do you need assistance with getting out of bed/getting out of a chair/moving?:  (She has a post op shoe for her left foot) Do you have difficulty managing or taking your medications?: Yes (family assists her as needed. Per Aloysius, she has a glucometer and is independent checking her blood sugars and administering her insulin )  Follow up appointments reviewed: PCP Follow-up appointment confirmed?: Yes Date of PCP follow-up appointment?: 06/28/24 Follow-up Provider: Haze Servant, NP Specialist Hospital Follow-up appointment confirmed?: Yes Date of Specialist follow-up appointment?: 07/04/24 Follow-Up Specialty Provider:: podiatry Do you need transportation to your follow-up appointment?: No Do you understand care options if your condition(s) worsen?: Yes-patient verbalized understanding    SIGNATURE Slater Diesel, RN

## 2024-06-26 NOTE — Transitions of Care (Post Inpatient/ED Visit) (Signed)
   06/26/2024  Name: Rebecca [name] MRN: [id number] DOB: 07-27-1966  Today's TOC FU Call Status: Today's TOC FU Call Status:: Unsuccessful Call (1st Attempt) Unsuccessful Call (1st Attempt) Date: 06/26/24  Attempted to reach the patient regarding the most recent Inpatient/ED visit.  Call made while utilizing Spanish Interpreter # [phone], via [employer].  Follow Up Plan: Additional outreach attempts will be made to reach the patient to complete the Transitions of Care (Post Inpatient/ED visit) call.   Andrea Dimes RN, BSN [place]  [facility] RN Care Manager [phone]

## 2024-06-27 ENCOUNTER — Telehealth: Payer: Self-pay | Admitting: Primary Care

## 2024-06-27 NOTE — Telephone Encounter (Signed)
 Pt conf appt this morning came on the wrong day!

## 2024-06-28 ENCOUNTER — Encounter (HOSPITAL_COMMUNITY): Payer: Self-pay | Admitting: *Deleted

## 2024-06-28 ENCOUNTER — Emergency Department (HOSPITAL_COMMUNITY): Admission: EM | Admit: 2024-06-28 | Discharge: 2024-06-29 | Disposition: A | Discharge status: Home / Self Care

## 2024-06-28 ENCOUNTER — Emergency Department (HOSPITAL_COMMUNITY)

## 2024-06-28 ENCOUNTER — Other Ambulatory Visit: Payer: Self-pay

## 2024-06-28 ENCOUNTER — Inpatient Hospital Stay: Admitting: Nurse Practitioner

## 2024-06-28 DIAGNOSIS — I10 Essential (primary) hypertension: Secondary | ICD-10-CM | POA: Insufficient documentation

## 2024-06-28 DIAGNOSIS — L03032 Cellulitis of left toe: Secondary | ICD-10-CM

## 2024-06-28 DIAGNOSIS — L97529 Non-pressure chronic ulcer of other part of left foot with unspecified severity: Secondary | ICD-10-CM | POA: Insufficient documentation

## 2024-06-28 DIAGNOSIS — Z79899 Other long term (current) drug therapy: Secondary | ICD-10-CM | POA: Insufficient documentation

## 2024-06-28 DIAGNOSIS — Z794 Long term (current) use of insulin: Secondary | ICD-10-CM | POA: Insufficient documentation

## 2024-06-28 DIAGNOSIS — E119 Type 2 diabetes mellitus without complications: Secondary | ICD-10-CM | POA: Insufficient documentation

## 2024-06-28 DIAGNOSIS — R21 Rash and other nonspecific skin eruption: Secondary | ICD-10-CM

## 2024-06-28 DIAGNOSIS — T148XXA Other injury of unspecified body region, initial encounter: Secondary | ICD-10-CM

## 2024-06-28 LAB — CBC
HCT: 30.8 % — ABNORMAL LOW (ref 36.0–46.0)
Hemoglobin: 9.8 g/dL — ABNORMAL LOW (ref 12.0–15.0)
MCH: 27.4 pg (ref 26.0–34.0)
MCHC: 31.8 g/dL (ref 30.0–36.0)
MCV: 86 fL (ref 80.0–100.0)
Platelets: 338 K/uL (ref 150–400)
RBC: 3.58 MIL/uL — ABNORMAL LOW (ref 3.87–5.11)
RDW: 12.5 % (ref 11.5–15.5)
WBC: 9.3 K/uL (ref 4.0–10.5)
nRBC: 0 % (ref 0.0–0.2)

## 2024-06-28 LAB — COMPREHENSIVE METABOLIC PANEL WITH GFR
ALT: 17 U/L (ref 0–44)
AST: 25 U/L (ref 15–41)
Albumin: 3.3 g/dL — ABNORMAL LOW (ref 3.5–5.0)
Alkaline Phosphatase: 100 U/L (ref 38–126)
Anion gap: 11 (ref 5–15)
BUN: 16 mg/dL (ref 6–20)
CO2: 21 mmol/L — ABNORMAL LOW (ref 22–32)
Calcium: 8.9 mg/dL (ref 8.9–10.3)
Chloride: 107 mmol/L (ref 98–111)
Creatinine, Ser: 1.79 mg/dL — ABNORMAL HIGH (ref 0.44–1.00)
GFR, Estimated: 32 mL/min — ABNORMAL LOW (ref >60)
Glucose, Bld: 85 mg/dL (ref 70–99)
Potassium: 4.4 mmol/L (ref 3.5–5.1)
Sodium: 139 mmol/L (ref 135–145)
Total Bilirubin: 0.5 mg/dL (ref 0.0–1.2)
Total Protein: 6.6 g/dL (ref 6.5–8.1)

## 2024-06-28 NOTE — ED Triage Notes (Signed)
 The pt is a diabetic and she was here one week ago for pain in her lt second toe  this am she noticed that her toe was discolored  that second toe is  dark ?? Poor circulation

## 2024-06-28 NOTE — ED Triage Notes (Signed)
 The  pt reports that the toe is numb with no pain in it

## 2024-06-29 ENCOUNTER — Other Ambulatory Visit: Payer: Self-pay

## 2024-06-29 ENCOUNTER — Ambulatory Visit

## 2024-06-29 ENCOUNTER — Emergency Department (HOSPITAL_COMMUNITY)

## 2024-06-29 DIAGNOSIS — L03032 Cellulitis of left toe: Secondary | ICD-10-CM

## 2024-06-29 MED ORDER — HYDROCORTISONE 1 % EX CREA
TOPICAL_CREAM | CUTANEOUS | 0 refills | Status: AC
  Filled 2024-06-29: qty 28.35, 15d supply, fill #0

## 2024-06-29 MED ORDER — DOXYCYCLINE HYCLATE 100 MG PO CAPS
100.0000 mg | ORAL_CAPSULE | Freq: Two times a day (BID) | ORAL | 0 refills | Status: DC
  Filled 2024-06-29: qty 14, 7d supply, fill #0

## 2024-06-29 NOTE — Consult Note (Addendum)
 PODIATRY CONSULTATION  NAME Rebecca [name] MRN [id number] DOB 10-27-1965 DOA 06/28/2024   Reason for consult:  Chief Complaint  Patient presents with   toe discolored    Attending/Consulting physician: Dr. Ula  History of present illness:  Rebecca [name] is a 58 y.o. Spanish-speaking female with medical history significant of hypertension, hyperlipidemia, CAD with remote history of LAD dissection, diabetes mellitus type 2, hyperthyroidism, and CKD stage III, s/p prior right great toe amputation presents with  left second toe redness   Patient known to me from prior consultation.  Saw Dr. Magdalen on September 24 after several days of concern for possible nailbed trauma and possible infection was started on doxycycline .  I evaluated the patient on September 29 after MRI was reviewed and there was no significant evidence of osteomyelitis in the left second toe decision made for follow-up outpatient with 7 days of doxycycline  and Augmentin .  She came back to the ED due to concern for some redness on the foot.  Has not had any drainage from the toe.  Past Medical History:  Diagnosis Date   Anemia    Diabetes mellitus    Dyslipidemia    Hypertension    Non-STEMI (non-ST elevated myocardial infarction) [facility]    Medical therapy for distal LAD dissection   Thyroid  disease        Latest Ref Rng & Units 06/28/2024    9:08 PM 06/25/2024    4:00 AM 06/24/2024    2:10 PM  CBC  WBC 4.0 - 10.5 K/uL 9.3  8.1  9.2   Hemoglobin 12.0 - 15.0 g/dL 9.8  9.9  89.3   Hematocrit 36.0 - 46.0 % 30.8  31.0  33.1   Platelets 150 - 400 K/uL 338  299  292        Latest Ref Rng & Units 06/28/2024    9:08 PM 06/25/2024    4:00 AM 06/24/2024    2:10 PM  BMP  Glucose 70 - 99 mg/dL 85  93  715   BUN 6 - 20 mg/dL 16  15  23    Creatinine 0.44 - 1.00 mg/dL 8.20  8.46  8.22   Sodium 135 - 145 mmol/L 139  138  137   Potassium 3.5 - 5.1 mmol/L 4.4  3.6  5.5   Chloride 98 - 111 mmol/L 107  108   106   CO2 22 - 32 mmol/L 21  19  19    Calcium  8.9 - 10.3 mg/dL 8.9  8.6  8.8       Physical Exam: Lower Extremity Exam  DP and PT pulse left foot palpable  Sensation diminished to light touch to digit level  Attention directed to the left second toe there is some purpleish discoloration of the distal aspect of the digit though no frank necrosis.  No maceration or drainage at the distal aspect of the toe appears to be some hyperkeratotic tissue that was removed from around the area but no open ulceration that is detectable      ASSESSMENT/PLAN OF CARE 58 y.o. female with PMHx significant for  hypertension, hyperlipidemia, CAD with remote history of LAD dissection, diabetes mellitus type 2, hyperthyroidism, and CKD stage III with rash of the dorsal left foot , no evidence of obvious osteomyelitis of the left second toe at this time  WBC 9.3 CRP 0.510 days ago, sed rate elevated at 60 10 days ago however was noted to be also chronically elevated at 54 6 years  ago  Repeat MRI left foot without contrast:1. Similar minimal marrow hyperintensity of the second distal phalanx without corresponding T1 hypointensity, favoring reactive marrow edema. 2. Redemonstrated soft tissue swelling of the second toe and along the dorsal aspect of second MTP joint. Increased subcutaneous edema along the dorsal forefoot. No loculated fluid collection.  -Discussed with Dr. Ula and PA LOIS Heater, no obvious evidence of osteomyelitis at this time I do not feel she needs admission for amputation.  I suspect some of the redness on the dorsal left foot is related to a rash possibly antibiotic reaction.  I favor this being due to Augmentin .  Stable for discharge home from my perspective and have her follow-up in the clinic next week.  -  Would discontinue Augmentin  but she can continue on doxycycline  for 7 days on discharge -   Anticoagulation: Okay to resume per home meds - Wound care: None required to left second  toe - WB status: Weightbearing as tolerated in postop shoe - patient has follow up scheduled with me 10/14 will reassess in clinic   Thank you for the consult.  Please contact me directly with any questions or concerns.           Marolyn JULIANNA Honour, DPM [facility] / [facility]    [address] [place], [place] [address]                Office [phone]  Fax [phone]

## 2024-06-29 NOTE — ED Provider Notes (Signed)
 [facility] EMERGENCY DEPARTMENT AT [facility] Provider Note   CSN: [id number] Arrival date & time: 06/28/24  2031     Patient presents with: toe discolored   PEGGI YONO is a 58 y.o. female.   CHISTINE DEMATTEO is a 58 y.o. female with history of hypertension, diabetes, dyslipidemia, NSTEMI, who presents to the emergency department for evaluation of discoloration of the left foot.  Has been followed by podiatry for ongoing issues with the left second toe, was previously seen in the emergency department for discoloration of this toe, had an MRI done which did not show evidence of acute osteomyelitis but was started on oral antibiotics, has since been following with Dr. Magdalen as an outpatient.  Is still on current antibiotics but is unsure exactly which ones.  She noticed that the second toe did continue to appear dusky and dark in color but she now has some redness over the top of the foot.  She denies any pain but does have decreased sensation chronically related to her diabetes.  She has not noted any possible drainage.  No fevers or chills.  She reports that she show if there is any changes to come in for reevaluation and so provide this point in for recheck.  Denies any new injury to the foot that she is aware of.  The history is provided by the patient and medical records.       Prior to Admission medications   Medication Sig Start Date End Date Taking? Authorizing Provider  amLODipine  (NORVASC ) 10 MG tablet Take 1 tablet (10 mg total) by mouth daily. 02/03/24   Celestia Rosaline SQUIBB, NP  atorvastatin  (LIPITOR ) 40 MG tablet Take 1 tablet (40 mg total) by mouth daily. 02/03/24   Celestia Rosaline SQUIBB, NP  ciprofloxacin  (CIPRO ) 500 MG tablet Take 1 tablet (500 mg total) by mouth 2 (two) times daily for 7 days. 06/25/24 07/02/24  Arlon Carliss ORN, DO  Dulaglutide  (TRULICITY ) 3 MG/0.5ML SOAJ Inject 3 mg into the skin once a week. 01/19/24   Celestia Rosaline SQUIBB, NP   gabapentin  (NEURONTIN ) 400 MG capsule Take 1 capsule (400 mg total) by mouth 4 (four) times daily. 02/03/24   Celestia Rosaline SQUIBB, NP  Insulin  Glargine (BASAGLAR  KWIKPEN) 100 UNIT/ML Inject 65 Units into the skin daily. 06/13/24   Celestia Rosaline SQUIBB, NP  insulin  lispro (HUMALOG  KWIKPEN) 100 UNIT/ML KwikPen Inject 10 Units into the skin 2 (two) times daily before lunch and supper. 02/03/24   Celestia Rosaline SQUIBB, NP  Insulin  Pen Needle 31G X 8 MM MISC Inject 1 each into the skin at bedtime. 02/03/21   Celestia Rosaline SQUIBB, NP  losartan  (COZAAR ) 100 MG tablet Take 1 tablet (100 mg total) by mouth daily. 02/03/24   Celestia Rosaline SQUIBB, NP  methimazole  (TAPAZOLE ) 5 MG tablet Take 1 tablet (5 mg total) by mouth every Monday, Wednesday, and Friday. 02/07/24   Celestia Rosaline SQUIBB, NP  metroNIDAZOLE  (FLAGYL ) 500 MG tablet Take 1 tablet (500 mg total) by mouth 2 (two) times daily for 7 days. 06/25/24 07/02/24  Arlon Carliss ORN, DO  ondansetron  (ZOFRAN ) 4 MG tablet Take 1 tablet (4 mg total) by mouth every 6 (six) hours as needed for nausea. 06/25/24   Arlon Carliss ORN, DO    Allergies: Patient has no known allergies.    Review of Systems  Constitutional:  Negative for chills and fever.  HENT: Negative.    Cardiovascular:  Negative for leg swelling.  Skin:  Positive  for color change and wound.    Updated Vital Signs BP 126/71 (BP Location: Left Arm)   Pulse 93   Temp 98.3 F (36.8 C)   Resp 18   Ht 5' 2 (1.575 m)   Wt 77.1 kg   LMP  (LMP Unknown)   SpO2 99%   BMI 31.09 kg/m   Physical Exam Vitals and nursing note reviewed.  Constitutional:      General: She is not in acute distress.    Appearance: Normal appearance. She is well-developed. She is not ill-appearing or diaphoretic.  HENT:     Head: Normocephalic and atraumatic.  Eyes:     General:        Right eye: No discharge.        Left eye: No discharge.  Cardiovascular:     Rate and Rhythm: Normal rate and regular rhythm.     Pulses:  Normal pulses.          Popliteal pulses are 2+ on the right side and 2+ on the left side.       Dorsalis pedis pulses are 2+ on the right side and 2+ on the left side.  Pulmonary:     Effort: Pulmonary effort is normal. No respiratory distress.     Breath sounds: Normal breath sounds.     Comments: Respirations equal and unlabored, patient able to speak in full sentences, lungs clear to auscultation bilaterally  Abdominal:     General: There is no distension.     Comments: Abdomen soft, nondistended, nontender to palpation in all quadrants without guarding or peritoneal signs  Musculoskeletal:        General: No deformity.     Cervical back: Neck supple.     Comments: Left foot pictured below, dusky discoloration of the second toe appears unchanged from previous photos with some dry and flaking skin, no purulence or bleeding noted, toe nail intact. Erythema present  to the top of the foot with some patch appearance   Skin:    General: Skin is warm and dry.     Capillary Refill: Capillary refill takes less than 2 seconds.  Neurological:     Mental Status: She is alert and oriented to person, place, and time.     Coordination: Coordination normal.     Comments: Speech is clear, able to follow commands Moves extremities without ataxia, coordination intact  Psychiatric:        Mood and Affect: Mood normal.        Behavior: Behavior normal.         (all labs ordered are listed, but only abnormal results are displayed) Labs Reviewed  COMPREHENSIVE METABOLIC PANEL WITH GFR - Abnormal; Notable for the following components:      Result Value   CO2 21 (*)    Creatinine, Ser 1.79 (*)    Albumin 3.3 (*)    GFR, Estimated 32 (*)    All other components within normal limits  CBC - Abnormal; Notable for the following components:   RBC 3.58 (*)    Hemoglobin 9.8 (*)    HCT 30.8 (*)    All other components within normal limits    EKG: None  Radiology: DG Foot Complete Left Result  Date: 06/28/2024 CLINICAL DATA:  Fall, left foot injury EXAM: LEFT FOOT - COMPLETE 3+ VIEW COMPARISON:  06/18/2024 FINDINGS: Mild hallux valgus deformity. No acute fracture or dislocation. 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 Extensive soft tissue  swelling of the left forefoot. IMPRESSION: 1. Soft tissue swelling. No acute fracture or dislocation. Electronically Signed   By: Dorethia Molt M.D.   On: 06/28/2024 21:38     Procedures   Medications Ordered in the ED - No data to display                                  Medical Decision Making Amount and/or Complexity of Data Reviewed Radiology: ordered.  Risk Prescription drug management.   58 year old female presents with somewhat chronic wound of the left foot with discoloration of the left second toe, house for prior multiple toe amputations on the right foot.  Followed by podiatry.  On antibiotics but noticed redness on the top of her foot, this does have a textured appearance difficult to say whether this is due to the sock or if this redness could be due to more to a rash but she certainly is at increased risk for infection and has been on antibiotics for this.  X-ray viewed interpreted independently without evidence of osteomyelitis.  Called and discussed case with Dr. Malvin with podiatry, recommends repeat MRI of the left foot to assess for progression or evidence of osteomyelitis and he will see and evaluate the patient.  MRI of the left foot obtained, agree with radiologist report and discussed results to Dr. Malvin is seen and evaluated patient as well with no evidence of acute osteomyelitis or fluid collection at this time.  He reports the patient may ultimately need a toe amputation down the road but he does not feel that this is indicated today.  Concerned that the redness may be more so related to the rash recommends discontinuing amoxicillin  but continuing doxycycline , will  prescribe hydrocortisone for rash.  Patient will follow-up closely with podiatry clinic.  I discussed this plan with patient in great detail using Spanish interpreter and answered all questions.  At this time patient is stable for discharge home.     Final diagnoses:  Rash of foot  Chronic wound    ED Discharge Orders          Ordered    doxycycline  (VIBRAMYCIN ) 100 MG capsule  2 times daily        06/29/24 1348    hydrocortisone cream 1 %        06/29/24 1348               Alva Larraine FALCON, PA-C 07/05/24 1603    Ula Prentice SAUNDERS, MD 07/11/24 [id number]

## 2024-06-29 NOTE — Discharge Instructions (Addendum)
 Your MRI today was reassuring.  Redness is thought to likely be a rash rather than worsening infection.  Stop taking Augmentin  (antibiotic)  Start taking doxycycline  (antibiotic)  Use prescribed hydrocortisone cream on the top of the foot twice daily.  Follow-up closely with podiatry clinic.

## 2024-07-04 ENCOUNTER — Ambulatory Visit (INDEPENDENT_AMBULATORY_CARE_PROVIDER_SITE_OTHER): Payer: Self-pay | Admitting: Podiatry

## 2024-07-04 DIAGNOSIS — Z91199 Patient's noncompliance with other medical treatment and regimen due to unspecified reason: Secondary | ICD-10-CM

## 2024-07-04 NOTE — Progress Notes (Signed)
 NS

## 2024-07-06 ENCOUNTER — Other Ambulatory Visit: Payer: Self-pay

## 2024-07-07 ENCOUNTER — Other Ambulatory Visit: Payer: Self-pay

## 2024-07-19 ENCOUNTER — Other Ambulatory Visit: Payer: Self-pay

## 2024-07-20 ENCOUNTER — Ambulatory Visit (INDEPENDENT_AMBULATORY_CARE_PROVIDER_SITE_OTHER): Payer: Self-pay | Admitting: Podiatry

## 2024-07-20 DIAGNOSIS — L03032 Cellulitis of left toe: Secondary | ICD-10-CM

## 2024-07-20 NOTE — Progress Notes (Signed)
  Subjective:  Patient ID: Rebecca [name], female    DOB: 1965/11/03,  MRN: [id number]  Chief Complaint  Patient presents with   Wound Check    F/U Osteomyelitis L2. 3 pain. Finished antibiotic. Wearing surgical shoe.    58 y.o. female presents for follow-up for possible osteomyelitis in the left second toe.  She has finished a course of antibiotics.  This was doxycycline .  She says the toe is feeling better.  She has been wearing surgical shoe.  No wound care has been needed denies drainage.  Past Medical History:  Diagnosis Date   Anemia    Diabetes mellitus    Dyslipidemia    Hypertension    Non-STEMI (non-ST elevated myocardial infarction) [facility])    Medical therapy for distal LAD dissection   Thyroid  disease     No Known Allergies  ROS: Negative except as per HPI above  Objective:  General: AAO x3, NAD  Dermatological: Attention to the left second toe there is a hyperkeratotic lesion with some central dried blood eschar though when debrided appears very healthy no evidence of open wound drainage erythema or edema.    Vascular:  Dorsalis Pedis artery and Posterior Tibial artery pedal pulses are 2/4 bilateral.  Capillary fill time < 3 sec to all digits.   Neruologic: Grossly intact via light touch bilateral. Protective threshold intact to all sites bilateral.   Musculoskeletal: No gross boney pedal deformities bilateral. No pain, crepitus, or limitation noted with foot and ankle range of motion bilateral. Muscular strength 5/5 in all groups tested bilateral.  Gait: Unassisted, Nonantalgic.    Radiographs:  06/29/2024 MR I left foot without contrast:1. Similar minimal marrow hyperintensity of the second distal phalanx without corresponding T1 hypointensity, favoring reactive marrow edema. 2. Redemonstrated soft tissue swelling of the second toe and along the dorsal aspect of second MTP joint. Increased subcutaneous edema along the dorsal forefoot. No loculated  fluid collection. Assessment:   1. Cellulitis of toe, left     Plan:  Patient was evaluated and treated and all questions answered.  # Prior cellulitis of left second toe, hyperkeratotic lesion there but no evidence of infection or osteomyelitis no open wound - Discussed with patient the area appears to be improved she says it is feeling better as well. - Lightly debrided hyperkeratotic lesion from the distal aspect of the left second toe and there is no underlying ulceration or open wound present.  No erythema or edema to suggest underlying osteomyelitis. - At this time I recommend continued monitoring of the area no need for wound care as there is no open wound she can wash the foot as normal. - She can go back to regular shoe - No antibiotics indicated - Continue to monitor for redness swelling or drainage from the end of the toe if these occur call us  and we will get her in for appointment follow-up as needed          Rebecca [name], DPM [facility] / [facility]

## 2024-07-21 ENCOUNTER — Other Ambulatory Visit (HOSPITAL_COMMUNITY): Payer: Self-pay

## 2024-07-21 ENCOUNTER — Other Ambulatory Visit: Payer: Self-pay

## 2024-07-21 ENCOUNTER — Encounter (INDEPENDENT_AMBULATORY_CARE_PROVIDER_SITE_OTHER): Payer: Self-pay | Admitting: Primary Care

## 2024-07-21 ENCOUNTER — Ambulatory Visit (INDEPENDENT_AMBULATORY_CARE_PROVIDER_SITE_OTHER): Payer: Self-pay | Admitting: Primary Care

## 2024-07-21 VITALS — BP 138/80 | HR 104 | Resp 16 | Wt 178.4 lb

## 2024-07-21 DIAGNOSIS — E1165 Type 2 diabetes mellitus with hyperglycemia: Secondary | ICD-10-CM

## 2024-07-21 DIAGNOSIS — Z794 Long term (current) use of insulin: Secondary | ICD-10-CM

## 2024-07-21 DIAGNOSIS — Z23 Encounter for immunization: Secondary | ICD-10-CM

## 2024-07-21 DIAGNOSIS — Z09 Encounter for follow-up examination after completed treatment for conditions other than malignant neoplasm: Secondary | ICD-10-CM

## 2024-07-21 DIAGNOSIS — T7840XS Allergy, unspecified, sequela: Secondary | ICD-10-CM

## 2024-07-21 DIAGNOSIS — Z7985 Long-term (current) use of injectable non-insulin antidiabetic drugs: Secondary | ICD-10-CM

## 2024-07-21 MED ORDER — FLUTICASONE PROPIONATE 50 MCG/ACT NA SUSP
2.0000 | Freq: Every day | NASAL | 6 refills | Status: DC
  Filled 2024-07-21: qty 16, 30d supply, fill #0

## 2024-07-21 MED ORDER — LORATADINE 10 MG PO TABS
10.0000 mg | ORAL_TABLET | Freq: Every day | ORAL | 1 refills | Status: DC
  Filled 2024-07-21: qty 90, 90d supply, fill #0

## 2024-07-21 NOTE — Progress Notes (Signed)
 Subjective:   Rebecca [name] is a 58 y.o. Hispanic female interpreterETTER Ford [phone] )presents for hospital follow up and establish care. Admit date to the hospital was 06/28/24, patient was discharged from the hospital on 06/29/24, patient was admitted for:  presents to the emergency department for evaluation of discoloration of the left foot. Has been followed by podiatry for ongoing issues with the left second toe, was previously seen in the emergency department for discoloration of this toe, had an MRI done which did not show evidence of acute osteomyelitis but was started on oral antibiotics, has since been following with Dr. Magdalen as an outpatient. Is still on current antibiotics but is unsure exactly which one  Past Medical History:  Diagnosis Date   Anemia    Diabetes mellitus    Dyslipidemia    Hypertension    Non-STEMI (non-ST elevated myocardial infarction) [facility])    Medical therapy for distal LAD dissection   Thyroid  disease      No Known Allergies  Current Outpatient Medications on File Prior to Visit  Medication Sig Dispense Refill   amLODipine  (NORVASC ) 10 MG tablet Take 1 tablet (10 mg total) by mouth daily. 90 tablet 1   atorvastatin  (LIPITOR ) 40 MG tablet Take 1 tablet (40 mg total) by mouth daily. 90 tablet 1   doxycycline  (VIBRAMYCIN ) 100 MG capsule Take 1 capsule (100 mg total) by mouth 2 (two) times daily. (Patient not taking: Reported on 07/20/2024) 14 capsule 0   Dulaglutide  (TRULICITY ) 3 MG/0.5ML SOAJ Inject 3 mg into the skin once a week. 6 mL 3   gabapentin  (NEURONTIN ) 400 MG capsule Take 1 capsule (400 mg total) by mouth 4 (four) times daily. 360 capsule 1   hydrocortisone cream 1 % Apply to affected area 2 times daily 28.35 g 0   Insulin  Glargine (BASAGLAR  KWIKPEN) 100 UNIT/ML Inject 65 Units into the skin daily. 18 mL 2   insulin  lispro (HUMALOG  KWIKPEN) 100 UNIT/ML KwikPen Inject 10 Units into the skin 2 (two) times daily before lunch and supper. 15 mL  3   Insulin  Pen Needle 31G X 8 MM MISC Inject 1 each into the skin at bedtime. 90 each 1   losartan  (COZAAR ) 100 MG tablet Take 1 tablet (100 mg total) by mouth daily. 90 tablet 1   methimazole  (TAPAZOLE ) 5 MG tablet Take 1 tablet (5 mg total) by mouth every Monday, Wednesday, and Friday. 90 tablet 1   ondansetron  (ZOFRAN ) 4 MG tablet Take 1 tablet (4 mg total) by mouth every 6 (six) hours as needed for nausea. 20 tablet 0   No current facility-administered medications on file prior to visit.    Review of System: ROS Comprehensive ROS Pertinent positive and negative noted in HPI   Objective:  BP 138/80   Pulse (!) 104   Resp 16   Wt 178 lb 6.4 oz (80.9 kg)   LMP  (LMP Unknown)   SpO2 100%   BMI 32.63 kg/m   Filed Weights   07/21/24 1057  Weight: 178 lb 6.4 oz (80.9 kg)    Physical Exam Vitals reviewed.  Constitutional:      Appearance: Normal appearance. She is obese.  HENT:     Head: Normocephalic.     Right Ear: Tympanic membrane, ear canal and external ear normal.     Left Ear: Tympanic membrane, ear canal and external ear normal.     Nose: Congestion present.     Comments: Bilateral red swollen turbinates left  greater than right with rhinitis and watery eyes    Mouth/Throat:     Mouth: Mucous membranes are moist.  Eyes:     Extraocular Movements: Extraocular movements intact.     Pupils: Pupils are equal, round, and reactive to light.  Cardiovascular:     Rate and Rhythm: Normal rate.  Pulmonary:     Effort: Pulmonary effort is normal.     Breath sounds: Normal breath sounds.  Abdominal:     General: Bowel sounds are normal.     Palpations: Abdomen is soft.  Musculoskeletal:        General: Normal range of motion.     Cervical back: Normal range of motion.  Skin:    General: Skin is warm and dry.  Neurological:     Mental Status: She is alert and oriented to person, place, and time.  Psychiatric:        Mood and Affect: Mood normal.        Behavior:  Behavior normal.        Thought Content: Thought content normal.      Assessment:  Rebecca [name] was seen today for hospitalization follow-up.  Diagnoses and all orders for this visit:  Uncontrolled type 2 diabetes mellitus with hyperglycemia (HCC) -     Microalbumin / creatinine urine ratio  Allergy, sequela -     fluticasone (FLONASE) 50 MCG/ACT nasal spray; Place 2 sprays into both nostrils daily.  Encounter for immunization -     Flu vaccine trivalent PF, 6mos and older(Flulaval,Afluria,Fluarix,Fluzone)  Other orders -     loratadine (CLARITIN) 10 MG tablet; Take 1 tablet (10 mg total) by mouth daily.   Hospital discharge follow-up  See HPI This note has been created with Dragon speech recognition software and paediatric nurse. Any transcriptional errors are unintentional.   No follow-ups on file.  Rebecca SHAUNNA Bohr, NP 07/21/2024, 11:43 AM

## 2024-07-22 ENCOUNTER — Ambulatory Visit (INDEPENDENT_AMBULATORY_CARE_PROVIDER_SITE_OTHER): Payer: Self-pay | Admitting: Primary Care

## 2024-07-22 LAB — MICROALBUMIN / CREATININE URINE RATIO
Creatinine, Urine: 83.3 mg/dL
Microalb/Creat Ratio: 867 mg/g{creat} — ABNORMAL HIGH (ref 0–29)
Microalbumin, Urine: 722.2 ug/mL

## 2024-07-22 NOTE — Progress Notes (Signed)
 Abnormal kidney function secondary to uncontrolled diabetes.

## 2024-07-26 ENCOUNTER — Other Ambulatory Visit: Payer: Self-pay

## 2024-07-28 ENCOUNTER — Other Ambulatory Visit: Payer: Self-pay

## 2024-08-23 ENCOUNTER — Other Ambulatory Visit (INDEPENDENT_AMBULATORY_CARE_PROVIDER_SITE_OTHER): Payer: Self-pay | Admitting: Primary Care

## 2024-08-23 ENCOUNTER — Other Ambulatory Visit: Payer: Self-pay

## 2024-08-25 ENCOUNTER — Other Ambulatory Visit: Payer: Self-pay

## 2024-08-25 MED ORDER — ATORVASTATIN CALCIUM 40 MG PO TABS
40.0000 mg | ORAL_TABLET | Freq: Every day | ORAL | 1 refills | Status: AC
  Filled 2024-08-25: qty 90, 90d supply, fill #0

## 2024-08-29 ENCOUNTER — Ambulatory Visit: Admitting: Podiatry

## 2024-08-29 ENCOUNTER — Ambulatory Visit

## 2024-08-29 ENCOUNTER — Encounter: Payer: Self-pay | Admitting: Podiatry

## 2024-08-29 DIAGNOSIS — M7742 Metatarsalgia, left foot: Secondary | ICD-10-CM

## 2024-08-29 DIAGNOSIS — M778 Other enthesopathies, not elsewhere classified: Secondary | ICD-10-CM

## 2024-08-29 DIAGNOSIS — M2042 Other hammer toe(s) (acquired), left foot: Secondary | ICD-10-CM

## 2024-08-29 DIAGNOSIS — E1149 Type 2 diabetes mellitus with other diabetic neurological complication: Secondary | ICD-10-CM

## 2024-08-29 DIAGNOSIS — L84 Corns and callosities: Secondary | ICD-10-CM

## 2024-08-29 MED ORDER — DOXYCYCLINE HYCLATE 100 MG PO TABS: 100.0000 mg | ORAL_TABLET | Freq: Two times a day (BID) | ORAL | 0 refills | Status: DC

## 2024-08-29 NOTE — Progress Notes (Signed)
 Patient presents today to be casted for custom molded orthotics. Dr. Gershon has been treating patient for Cellulitis of toe, bone infection.   Impression was taken Patient info-  Shoe size: 7 178 Shoe style: Diabetic shoes   Height: 5'2  Weight: 178  Insurance: [employer]   Patient will be notified once orthotics arrive in office and reappoint for fitting at that time.  -  Subjective: Chief Complaint  Patient presents with   Foot Pain    Left foot ball of foot pain 5 pain. Pt. States it feels like a blister. IDDM A1C 12.70.   58 year old female presents the office with concerns of pain to the ball of her left foot.  She states that it is painful with walking and pressure.  No recent injuries that she reports.  She also has had ongoing swelling to the second toe but is not new.  She previous is on antibiotics.  She has a painful corn at the tip of the toe.  No open lesions otherwise.  No drainage.  No fevers or chills.  Objective: AAO x3, NAD DP/PT pulses palpable bilaterally, CRT less than 3 seconds Sensation decreased. Hyperkeratotic lesion with dried blood present under the callus noted to the distal aspect the left second toe.  There is no underlying ulceration, drainage or any signs of infection.  No open lesions noted otherwise.  There is prominence of the metatarsal head plantarly but this is where she gets tenderness to palpation.  No other areas of pinpoint tenderness identified at this time.  There is no dorsal foot pain. Prior hallux amputation on the right side.  Prominent metatarsal heads noted. No pain with calf compression, swelling, warmth, erythema  Assessment: Prominent metatarsal head resulting in ongoing chronic discomfort; hammertoe deformity resulting in preulcerative lesion distal second toe with localized edema to the digit  Plan: -All treatment options discussed with the patient including all alternatives, risks, complications.  -X-rays obtained reviewed.   No evidence of acute fracture.  Digital contracture present.  There is no definitive cortical changes to suggest osteomyelitis. -Sharp debrided hyperkeratotic lesion to distal aspect left second toe without any complications or bleeding. -Given the mild edema to the second toe would start antibiotics.  Prescribe doxycycline  -I do think she will benefit from new accommodative inserts to help offload submetatarsal area as her inserts are worn out.  She was seen today and to be measured for inserts. -Daily foot inspection with glucose control. -Patient encouraged to call the office with any questions, concerns, change in symptoms.   Rebecca [name] DPM

## 2024-08-29 NOTE — Progress Notes (Signed)
 Patient presents today to be casted for custom molded orthotics. Dr. Gershon has been treating patient for Cellulitis of toe, bone infection.   Impression was taken Patient info-  Shoe size: 7 178 Shoe style: Diabetic shoes   Height: 5'2  Weight: 178  Insurance: [employer]   Patient will be notified once orthotics arrive in office and reappoint for fitting at that time.

## 2024-08-29 NOTE — Progress Notes (Unsigned)
 Entered in error

## 2024-09-06 ENCOUNTER — Other Ambulatory Visit: Payer: Self-pay

## 2024-09-07 ENCOUNTER — Other Ambulatory Visit: Payer: Self-pay

## 2024-09-25 ENCOUNTER — Other Ambulatory Visit (HOSPITAL_COMMUNITY): Payer: Self-pay

## 2024-09-25 ENCOUNTER — Encounter (INDEPENDENT_AMBULATORY_CARE_PROVIDER_SITE_OTHER): Payer: Self-pay | Admitting: Primary Care

## 2024-09-25 ENCOUNTER — Ambulatory Visit (INDEPENDENT_AMBULATORY_CARE_PROVIDER_SITE_OTHER): Payer: Self-pay | Admitting: Primary Care

## 2024-09-25 ENCOUNTER — Other Ambulatory Visit: Payer: Self-pay | Admitting: Pharmacist

## 2024-09-25 ENCOUNTER — Other Ambulatory Visit: Payer: Self-pay

## 2024-09-25 VITALS — BP 145/88 | HR 106 | Resp 16 | Ht 62.5 in | Wt 176.4 lb

## 2024-09-25 DIAGNOSIS — T7840XS Allergy, unspecified, sequela: Secondary | ICD-10-CM

## 2024-09-25 DIAGNOSIS — Z760 Encounter for issue of repeat prescription: Secondary | ICD-10-CM

## 2024-09-25 DIAGNOSIS — Z7985 Long-term (current) use of injectable non-insulin antidiabetic drugs: Secondary | ICD-10-CM

## 2024-09-25 DIAGNOSIS — E782 Mixed hyperlipidemia: Secondary | ICD-10-CM

## 2024-09-25 DIAGNOSIS — I10 Essential (primary) hypertension: Secondary | ICD-10-CM

## 2024-09-25 DIAGNOSIS — E1142 Type 2 diabetes mellitus with diabetic polyneuropathy: Secondary | ICD-10-CM

## 2024-09-25 DIAGNOSIS — Z794 Long term (current) use of insulin: Secondary | ICD-10-CM

## 2024-09-25 DIAGNOSIS — E1165 Type 2 diabetes mellitus with hyperglycemia: Secondary | ICD-10-CM

## 2024-09-25 DIAGNOSIS — E0590 Thyrotoxicosis, unspecified without thyrotoxic crisis or storm: Secondary | ICD-10-CM

## 2024-09-25 MED ORDER — BASAGLAR KWIKPEN 100 UNIT/ML ~~LOC~~ SOPN
65.0000 [IU] | PEN_INJECTOR | Freq: Every day | SUBCUTANEOUS | 2 refills | Status: AC
  Filled 2024-09-25: qty 18, 27d supply, fill #0

## 2024-09-25 MED ORDER — FREESTYLE LIBRE 3 READER DEVI
0 refills | Status: AC
  Filled 2024-09-25: qty 1, 30d supply, fill #0

## 2024-09-25 MED ORDER — INSULIN PEN NEEDLE 31G X 8 MM MISC
1.0000 | Freq: Every day | 1 refills | Status: AC
  Filled 2024-09-25: qty 100, 25d supply, fill #0
  Filled 2024-09-25: qty 100, 33d supply, fill #0

## 2024-09-25 MED ORDER — FREESTYLE LIBRE 3 PLUS SENSOR MISC
6 refills | Status: AC
  Filled 2024-09-25: qty 2, 30d supply, fill #0

## 2024-09-25 MED ORDER — AMLODIPINE BESYLATE 10 MG PO TABS
10.0000 mg | ORAL_TABLET | Freq: Every day | ORAL | 1 refills | Status: AC
  Filled 2024-09-25: qty 90, 90d supply, fill #0

## 2024-09-25 MED ORDER — LORATADINE 10 MG PO TABS
10.0000 mg | ORAL_TABLET | Freq: Every day | ORAL | 1 refills | Status: AC
  Filled 2024-09-25: qty 90, 90d supply, fill #0

## 2024-09-25 MED ORDER — FLUTICASONE PROPIONATE 50 MCG/ACT NA SUSP
2.0000 | Freq: Every day | NASAL | 6 refills | Status: AC
  Filled 2024-09-25: qty 16, 30d supply, fill #0

## 2024-09-25 MED ORDER — LOSARTAN POTASSIUM 100 MG PO TABS
100.0000 mg | ORAL_TABLET | Freq: Every day | ORAL | 1 refills | Status: AC
  Filled 2024-09-25: qty 90, 90d supply, fill #0

## 2024-09-25 MED ORDER — TRULICITY 3 MG/0.5ML ~~LOC~~ SOAJ
3.0000 mg | SUBCUTANEOUS | 3 refills | Status: AC
  Filled 2024-09-25: qty 2, 28d supply, fill #0

## 2024-09-25 MED ORDER — GABAPENTIN 400 MG PO CAPS
400.0000 mg | ORAL_CAPSULE | Freq: Four times a day (QID) | ORAL | 1 refills | Status: AC
  Filled 2024-09-25: qty 360, 90d supply, fill #0

## 2024-09-25 MED ORDER — INSULIN LISPRO (1 UNIT DIAL) 100 UNIT/ML (KWIKPEN)
10.0000 [IU] | PEN_INJECTOR | Freq: Two times a day (BID) | SUBCUTANEOUS | 3 refills | Status: AC
  Filled 2024-09-25: qty 15, 75d supply, fill #0

## 2024-09-25 NOTE — Progress Notes (Signed)
 "  Subjective:  Patient ID: Rebecca [name], female    DOB: Aug 06, 1966  Age: 59 y.o. MRN: [id number]  CC: Diabetes and Hypertension   Rebecca [name] presents forFollow-up of diabetes. Patient does not check blood sugar at home  Compliant with meds - Yes Checking CBGs? No  Fasting avg -   Postprandial average -  Exercising regularly? - Yes Watching carbohydrate intake? - Yes Neuropathy ? - Yes Hypoglycemic events - Yes  - Recovers with :   Pertinent ROS:  Polyuria - No Polydipsia - No Vision problems - Yes  Medications as noted below. Taking them regularly without complication/adverse reaction being reported today.   History Rebecca [name] has a past medical history of Anemia, Diabetes mellitus, Dyslipidemia, Hypertension, Non-STEMI (non-ST elevated myocardial infarction) (HCC), and Thyroid  disease.   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   Her family history includes Cancer in her mother; Diabetes in her brother, brother, brother, brother, brother, sister, sister, sister, and sister; Hyperlipidemia in her father; Hypertension in her father.She reports that she has never smoked. She has never used smokeless tobacco. She reports that she does not drink alcohol and does not use drugs.  Medications Ordered Prior to Encounter[1]  Review of Systems Comprehensive ROS Pertinent positive and negative noted in HPI   Objective:  BP (!) 145/88   Pulse (!) 106   Resp 16   Ht 5' 2.5 (1.588 m)   Wt 176 lb 6.4 oz (80 kg)   LMP  (LMP Unknown)   SpO2 100%   BMI 31.75 kg/m   BP Readings from Last 3 Encounters:  09/25/24 (!) 145/88  07/21/24 138/80  06/29/24 (!) 149/79    Wt Readings from Last 3 Encounters:  09/25/24 176 lb 6.4 oz (80 kg)  07/21/24 178 lb 6.4 oz (80.9 kg)  06/28/24 169 lb 15.6 oz (77.1 kg)    Physical Exam Vitals reviewed.   Constitutional:      Appearance: Normal appearance. She is obese.  HENT:     Head: Normocephalic.     Right Ear: Tympanic membrane, ear canal and external ear normal.     Left Ear: Tympanic membrane, ear canal and external ear normal.     Nose: Nose normal.     Mouth/Throat:     Mouth: Mucous membranes are moist.  Eyes:     Extraocular Movements: Extraocular movements intact.     Pupils: Pupils are equal, round, and reactive to light.  Cardiovascular:     Rate and Rhythm: Normal rate.  Pulmonary:     Effort: Pulmonary effort is normal.     Breath sounds: Normal breath sounds.  Abdominal:     General: Bowel sounds are normal.     Palpations: Abdomen is soft.  Musculoskeletal:        General: Normal range of motion.     Cervical back: Normal range of motion.  Skin:    General: Skin is warm and dry.  Neurological:     Mental Status: She is alert and oriented to person, place, and time.  Psychiatric:        Mood and Affect: Mood normal.        Behavior: Behavior normal.        Thought Content: Thought content normal.     Lab Results  Component Value Date   HGBA1C 12.3 (H) 06/18/2024   HGBA1C 8.8 (A) 02/03/2024   HGBA1C 8.4 (A)  08/06/2023    Lab Results  Component Value Date   WBC 9.3 06/28/2024   HGB 9.8 (L) 06/28/2024   HCT 30.8 (L) 06/28/2024   PLT 338 06/28/2024   GLUCOSE 85 06/28/2024   CHOL 130 02/03/2024   TRIG 116 02/03/2024   HDL 47 02/03/2024   LDLCALC 62 02/03/2024   ALT 17 06/28/2024   AST 25 06/28/2024   NA 139 06/28/2024   K 4.4 06/28/2024   CL 107 06/28/2024   CREATININE 1.79 (H) 06/28/2024   BUN 16 06/28/2024   CO2 21 (L) 06/28/2024   TSH 1.800 02/03/2024   INR 1.08 05/13/2015   HGBA1C 12.3 (H) 06/18/2024   MICROALBUR 0.50 06/04/2009    Title   Diabetic Foot Exam - detailed    Semmes-Weinstein Monofilament Test + means has sensation and - means no sensation      Image components are not supported.   Image components are  not supported. Image components are not supported.  [place] Comments      Assessment & Plan:  Rebecca [name] was seen today for diabetes and hypertension.  Diagnoses and all orders for this visit:  Hyperthyroidism -     TSH + free T4  Allergy, sequela -     fluticasone  (FLONASE ) 50 MCG/ACT nasal spray; Place 2 sprays into both nostrils daily.  Uncontrolled type 2 diabetes mellitus with hyperglycemia (HCC) -     Dulaglutide  (TRULICITY ) 3 MG/0.5ML SOAJ; Inject 3 mg into the skin once a week. -     gabapentin  (NEURONTIN ) 400 MG capsule; Take 1 capsule (400 mg total) by mouth 4 (four) times daily. -     Insulin  Glargine (BASAGLAR  KWIKPEN) 100 UNIT/ML; Inject 65 Units into the skin daily. -     Insulin  Pen Needle 31G X 8 MM MISC; Inject 1 each into the skin at bedtime. -     CBC with Differential/Platelet -     CMP14+EGFR  Medication refill -     Insulin  Pen Needle 31G X 8 MM MISC; Inject 1 each into the skin at bedtime.  Essential hypertension -     amLODipine  (NORVASC ) 10 MG tablet; Take 1 tablet (10 mg total) by mouth daily. -     losartan  (COZAAR ) 100 MG tablet; Take 1 tablet (100 mg total) by mouth daily. -     CMP14+EGFR -     Lipid panel  Diabetic polyneuropathy associated with type 2 diabetes mellitus (HCC) -     gabapentin  (NEURONTIN ) 400 MG capsule; Take 1 capsule (400 mg total) by mouth 4 (four) times daily.  Mixed hyperlipidemia -     Lipid panel  Other orders -     insulin  lispro (HUMALOG  KWIKPEN) 100 UNIT/ML KwikPen; Inject 10 Units into the skin 2 (two) times daily before lunch and supper. -     loratadine  (CLARITIN ) 10 MG tablet; Take 1 tablet (10 mg total) by mouth daily.      Follow-up:  Return in about 3 months (around 12/24/2024).  The above assessment and management plan was discussed with the patient. The patient verbalized understanding of and has agreed to the management plan. Patient is aware to call the clinic if symptoms fail to improve or worsen.  Patient is aware when to return to the clinic for a follow-up visit. Patient educated on when it is appropriate to go to the emergency department.   Rebecca Bohr, NP-C      [1]  Current Outpatient Medications on File Prior to Visit  Medication Sig Dispense Refill   atorvastatin  (LIPITOR ) 40 MG tablet Take 1 tablet (40 mg total) by mouth daily. 90 tablet 1   doxycycline  (VIBRA -TABS) 100 MG tablet Take 1 tablet (100 mg total) by mouth 2 (two) times daily. 20 tablet 0   doxycycline  (VIBRAMYCIN ) 100 MG capsule Take 1 capsule (100 mg total) by mouth 2 (two) times daily. 14 capsule 0   hydrocortisone  cream 1 % Apply to affected area 2 times daily 28.35 g 0   methimazole  (TAPAZOLE ) 5 MG tablet Take 1 tablet (5 mg total) by mouth every Monday, Wednesday, and Friday. 90 tablet 1   ondansetron  (ZOFRAN ) 4 MG tablet Take 1 tablet (4 mg total) by mouth every 6 (six) hours as needed for nausea. 20 tablet 0   No current facility-administered medications on file prior to visit.   "

## 2024-09-26 ENCOUNTER — Other Ambulatory Visit: Payer: Self-pay

## 2024-09-26 ENCOUNTER — Ambulatory Visit (INDEPENDENT_AMBULATORY_CARE_PROVIDER_SITE_OTHER): Payer: Self-pay | Admitting: Primary Care

## 2024-09-26 ENCOUNTER — Telehealth: Payer: Self-pay

## 2024-09-26 DIAGNOSIS — N1832 Chronic kidney disease, stage 3b: Secondary | ICD-10-CM

## 2024-09-26 LAB — CMP14+EGFR
ALT: 18 IU/L (ref 0–32)
AST: 22 IU/L (ref 0–40)
Albumin: 4.2 g/dL (ref 3.8–4.9)
Alkaline Phosphatase: 137 IU/L — ABNORMAL HIGH (ref 49–135)
BUN/Creatinine Ratio: 15 (ref 9–23)
BUN: 23 mg/dL (ref 6–24)
Bilirubin Total: 0.4 mg/dL (ref 0.0–1.2)
CO2: 23 mmol/L (ref 20–29)
Calcium: 9.2 mg/dL (ref 8.7–10.2)
Chloride: 105 mmol/L (ref 96–106)
Creatinine, Ser: 1.58 mg/dL — ABNORMAL HIGH (ref 0.57–1.00)
Globulin, Total: 3.1 g/dL (ref 1.5–4.5)
Glucose: 164 mg/dL — ABNORMAL HIGH (ref 70–99)
Potassium: 5.2 mmol/L (ref 3.5–5.2)
Sodium: 140 mmol/L (ref 134–144)
Total Protein: 7.3 g/dL (ref 6.0–8.5)
eGFR: 38 mL/min/1.73 — ABNORMAL LOW

## 2024-09-26 LAB — HEMOGLOBIN A1C
Est. average glucose Bld gHb Est-mCnc: 203 mg/dL
Hgb A1c MFr Bld: 8.7 % — ABNORMAL HIGH (ref 4.8–5.6)

## 2024-09-26 LAB — CBC WITH DIFFERENTIAL/PLATELET

## 2024-09-26 LAB — LIPID PANEL
Chol/HDL Ratio: 3.2 ratio (ref 0.0–4.4)
Cholesterol, Total: 146 mg/dL (ref 100–199)
HDL: 45 mg/dL
LDL Chol Calc (NIH): 70 mg/dL (ref 0–99)
Triglycerides: 188 mg/dL — ABNORMAL HIGH (ref 0–149)
VLDL Cholesterol Cal: 31 mg/dL (ref 5–40)

## 2024-09-26 LAB — SPECIMEN STATUS REPORT

## 2024-09-26 LAB — TSH+FREE T4
Free T4: 1.15 ng/dL (ref 0.82–1.77)
TSH: 1.94 u[IU]/mL (ref 0.450–4.500)

## 2024-09-26 NOTE — Telephone Encounter (Signed)
 Hi Ladies- Rosaline is requesting transportation for this patient to her appointment with Herlene next week, 10/06/2023.   Are you able to set this up for her?   Thanks so much!

## 2024-09-26 NOTE — Telephone Encounter (Addendum)
 Yes, I have contacted her, and she is aware of the pickup time and date.

## 2024-09-26 NOTE — Telephone Encounter (Addendum)
 Hi Rebecca [name], Transportation has been set up for the patient for the appointment on 10/05/2024.  Thanks!  Transportation Voucher  Pick up date and time: 10/05/2024 3:00 pm. Pick up address: [address], [place] [address] Drop off address: [address] [address]

## 2024-09-27 ENCOUNTER — Other Ambulatory Visit: Payer: Self-pay

## 2024-09-27 LAB — CBC WITH DIFFERENTIAL/PLATELET

## 2024-09-27 LAB — SPECIMEN STATUS REPORT

## 2024-09-29 ENCOUNTER — Ambulatory Visit: Payer: Self-pay

## 2024-09-29 ENCOUNTER — Other Ambulatory Visit: Payer: Self-pay

## 2024-09-29 DIAGNOSIS — M2042 Other hammer toe(s) (acquired), left foot: Secondary | ICD-10-CM

## 2024-09-29 DIAGNOSIS — M7742 Metatarsalgia, left foot: Secondary | ICD-10-CM

## 2024-09-29 DIAGNOSIS — L84 Corns and callosities: Secondary | ICD-10-CM

## 2024-09-29 DIAGNOSIS — M25561 Pain in right knee: Secondary | ICD-10-CM

## 2024-09-29 DIAGNOSIS — E1149 Type 2 diabetes mellitus with other diabetic neurological complication: Secondary | ICD-10-CM

## 2024-09-29 DIAGNOSIS — G8929 Other chronic pain: Secondary | ICD-10-CM

## 2024-09-29 MED ORDER — DOXYCYCLINE HYCLATE 100 MG PO TABS
100.0000 mg | ORAL_TABLET | Freq: Two times a day (BID) | ORAL | 0 refills | Status: AC
  Filled 2024-09-29: qty 14, 7d supply, fill #0

## 2024-09-29 NOTE — Progress Notes (Unsigned)
 Left second toe hyperkeratosis- pictures. No infection. Doxycycline  because amps on other side. [facility] referral for right knee pain

## 2024-10-02 ENCOUNTER — Telehealth: Payer: Self-pay

## 2024-10-02 NOTE — Telephone Encounter (Signed)
 Inserts are in [place], patient has an appointment with Dr. Gershon on 11/02/2024 to pick up.

## 2024-10-05 ENCOUNTER — Ambulatory Visit: Payer: Self-pay | Admitting: Pharmacist

## 2024-10-06 ENCOUNTER — Ambulatory Visit (INDEPENDENT_AMBULATORY_CARE_PROVIDER_SITE_OTHER): Payer: Self-pay | Admitting: Physician Assistant

## 2024-10-06 ENCOUNTER — Encounter: Payer: Self-pay | Admitting: Physician Assistant

## 2024-10-06 ENCOUNTER — Other Ambulatory Visit: Payer: Self-pay

## 2024-10-06 DIAGNOSIS — M1711 Unilateral primary osteoarthritis, right knee: Secondary | ICD-10-CM

## 2024-10-06 DIAGNOSIS — M25561 Pain in right knee: Secondary | ICD-10-CM

## 2024-10-06 NOTE — Progress Notes (Signed)
 "  Office Visit Note   Patient: Rebecca [name]           Date of Birth: 03/16/1966           MRN: [id number] Visit Date: 10/06/2024              Requested by: Magdalen Prentice PARAS, DPM [address] [place],  [place] [address] PCP: Celestia Rosaline SQUIBB, NP   Assessment & Plan: Visit Diagnoses:  1. Right knee pain, unspecified chronicity   2. Arthritis of right knee     Plan: Patient is a pleasant 59 year old woman with a chief complaint of right knee pain x 2 years.  She denies any recent injury.  She has had no treatment takes no medication.  She is not a consistently well-controlled diabetic and has had multiple toe amputations.  Based on her exam today would like for her to try physical therapy as well as topical anti-inflammatories she cannot use any oral anti-inflammatories as she does have kidney disease.  She can take Tylenol  as needed.  If she does not improve with either of these she should follow-up patient was seen with the help of a Spanish interpreter today  Follow-Up Instructions: No follow-ups on file.   Orders:  Orders Placed This Encounter  Procedures   XR Knee 1-2 Views Right   Ambulatory referral to Physical Therapy   No orders of the defined types were placed in this encounter.     Procedures: No procedures performed   Clinical Data: No additional findings.   Subjective: No chief complaint on file.   HPI pleasant 59 year old woman comes in today with a 2-year history of right knee pain no particular injury she has not really had this treated she says sometimes the knee swells.  Review of Systems  All other systems reviewed and are negative.    Objective: Vital Signs: LMP  (LMP Unknown)   Physical Exam Constitutional:      Appearance: Normal appearance.  Pulmonary:     Effort: Pulmonary effort is normal.  Skin:    General: Skin is warm and dry.  Neurological:     General: No focal deficit present.     Mental Status: She is alert and  oriented to person, place, and time.  Psychiatric:        Mood and Affect: Mood normal.        Behavior: Behavior normal.     Ortho Exam Examination of right knee she has a small effusion compartments are soft and compressible she is neurovascular intact she has good extension and flexion actively.  Good stability posterior anterior medial lateral.  She has positive grinding with range of motion Specialty Comments:  No specialty comments available.  Imaging: No results found.   PMFS History: Patient Active Problem List   Diagnosis Date Noted   Cellulitis of left toe 06/29/2024   Acute colitis 06/24/2024   Osteomyelitis of second toe of left foot (HCC) 06/18/2024   Uncontrolled type 2 diabetes mellitus with hyperglycemia, with long-term current use of insulin  (HCC) 06/18/2024   History of osteomyelitis 06/18/2024   Chronic kidney disease, stage III (moderate) (HCC) 06/18/2024   Osteomyelitis of second toe of right foot (HCC)    Ulcer of right foot, limited to breakdown of skin (HCC) 02/21/2020   Diarrhea    Hyperthyroidism    Insulin  dependent diabetes mellitus    Orthostasis    Vaginal discharge    Infectious diarrhea 11/26/2018   Hyperkalemia 11/26/2018  Bacteremia 06/21/2018   Sepsis secondary to UTI (HCC) 06/20/2018   Right great toe amputee 08/05/2017   Mild renal insufficiency 07/19/2017   Cellulitis in diabetic foot (HCC) 07/19/2017   AKI (acute kidney injury) 03/04/2016   Essential hypertension 05/14/2015   CAD- with distal LAD disease, ? 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/2012   Past Medical History:  Diagnosis Date   Anemia    Diabetes mellitus    Dyslipidemia    Hypertension    Non-STEMI  (non-ST elevated myocardial infarction) [facility])    Medical therapy for distal LAD dissection   Thyroid  disease     Family History  Problem Relation Age of Onset   Cancer Mother    Hypertension Father    Hyperlipidemia Father    Diabetes Sister    Diabetes Sister    Diabetes Sister    Diabetes Sister    Diabetes Brother    Diabetes Brother    Diabetes Brother    Diabetes Brother    Diabetes Brother    Breast cancer Neg Hx     Past Surgical History:  Procedure Laterality Date   AMPUTATION Right 07/21/2017   Procedure: RIGHT GREAT TOE AMPUTATION;  Surgeon: Harden Jerona GAILS, MD;  Location: [facility] OR;  Service: Orthopedics;  Laterality: Right;   AMPUTATION Right 07/05/2020   Procedure: RIGHT FOOT 2ND RAY AMPUTATION;  Surgeon: Harden Jerona GAILS, MD;  Location: [facility] OR;  Service: Orthopedics;  Laterality: Right;   CARDIAC CATHETERIZATION N/A 05/14/2015   Procedure: Left Heart Cath and Coronary Angiography;  Surgeon: Alm LELON Clay, MD; dLAD 60%>>95% (small vessel, ?dissection), CFX & RCA systems no sig dz, EF & LVEDP nl      Social History   Occupational History   Occupation: unemployed  Tobacco Use   Smoking status: Never   Smokeless tobacco: Never  Vaping Use   Vaping status: Never Used  Substance and Sexual Activity   Alcohol use: No   Drug use: No   Sexual activity: Yes        "

## 2024-11-02 ENCOUNTER — Ambulatory Visit: Payer: Self-pay | Admitting: Podiatry

## 2024-12-25 ENCOUNTER — Ambulatory Visit (INDEPENDENT_AMBULATORY_CARE_PROVIDER_SITE_OTHER): Payer: Self-pay | Admitting: Primary Care
